# Patient Record
Sex: Male | Born: 1955 | Race: White | Hispanic: No | Marital: Married | Smoking: Former smoker
Health system: Southern US, Community
[De-identification: ages and names within clinical notes are randomized; demographics above are authoritative.]

## PROBLEM LIST (undated history)

## (undated) DIAGNOSIS — M51369 Other intervertebral disc degeneration, lumbar region without mention of lumbar back pain or lower extremity pain: Secondary | ICD-10-CM

## (undated) DIAGNOSIS — T753XXA Motion sickness, initial encounter: Secondary | ICD-10-CM

## (undated) DIAGNOSIS — N50812 Left testicular pain: Secondary | ICD-10-CM

## (undated) DIAGNOSIS — J449 Chronic obstructive pulmonary disease, unspecified: Secondary | ICD-10-CM

## (undated) DIAGNOSIS — M4802 Spinal stenosis, cervical region: Secondary | ICD-10-CM

## (undated) DIAGNOSIS — K5790 Diverticulosis of intestine, part unspecified, without perforation or abscess without bleeding: Secondary | ICD-10-CM

## (undated) DIAGNOSIS — R0600 Dyspnea, unspecified: Secondary | ICD-10-CM

## (undated) DIAGNOSIS — I10 Essential (primary) hypertension: Secondary | ICD-10-CM

## (undated) DIAGNOSIS — Z8719 Personal history of other diseases of the digestive system: Secondary | ICD-10-CM

## (undated) DIAGNOSIS — M5136 Other intervertebral disc degeneration, lumbar region: Secondary | ICD-10-CM

## (undated) DIAGNOSIS — M1990 Unspecified osteoarthritis, unspecified site: Secondary | ICD-10-CM

## (undated) DIAGNOSIS — G479 Sleep disorder, unspecified: Secondary | ICD-10-CM

## (undated) DIAGNOSIS — F419 Anxiety disorder, unspecified: Secondary | ICD-10-CM

## (undated) DIAGNOSIS — I483 Typical atrial flutter: Secondary | ICD-10-CM

## (undated) DIAGNOSIS — F40240 Claustrophobia: Secondary | ICD-10-CM

## (undated) DIAGNOSIS — N400 Enlarged prostate without lower urinary tract symptoms: Secondary | ICD-10-CM

## (undated) DIAGNOSIS — K649 Unspecified hemorrhoids: Secondary | ICD-10-CM

## (undated) DIAGNOSIS — K4090 Unilateral inguinal hernia, without obstruction or gangrene, not specified as recurrent: Secondary | ICD-10-CM

## (undated) DIAGNOSIS — G4730 Sleep apnea, unspecified: Secondary | ICD-10-CM

## (undated) DIAGNOSIS — N529 Male erectile dysfunction, unspecified: Secondary | ICD-10-CM

## (undated) DIAGNOSIS — IMO0002 Reserved for concepts with insufficient information to code with codable children: Secondary | ICD-10-CM

## (undated) DIAGNOSIS — M4180 Other forms of scoliosis, site unspecified: Secondary | ICD-10-CM

## (undated) DIAGNOSIS — Z8679 Personal history of other diseases of the circulatory system: Secondary | ICD-10-CM

## (undated) DIAGNOSIS — K219 Gastro-esophageal reflux disease without esophagitis: Secondary | ICD-10-CM

## (undated) DIAGNOSIS — K635 Polyp of colon: Secondary | ICD-10-CM

## (undated) DIAGNOSIS — K449 Diaphragmatic hernia without obstruction or gangrene: Secondary | ICD-10-CM

## (undated) DIAGNOSIS — E785 Hyperlipidemia, unspecified: Secondary | ICD-10-CM

## (undated) DIAGNOSIS — F329 Major depressive disorder, single episode, unspecified: Secondary | ICD-10-CM

## (undated) DIAGNOSIS — K5900 Constipation, unspecified: Secondary | ICD-10-CM

## (undated) DIAGNOSIS — M5020 Other cervical disc displacement, unspecified cervical region: Secondary | ICD-10-CM

## (undated) DIAGNOSIS — F32A Depression, unspecified: Secondary | ICD-10-CM

## (undated) DIAGNOSIS — M5126 Other intervertebral disc displacement, lumbar region: Secondary | ICD-10-CM

## (undated) HISTORY — DX: Other cervical disc displacement, unspecified cervical region: M50.20

## (undated) HISTORY — DX: Sleep disorder, unspecified: G47.9

## (undated) HISTORY — DX: Anxiety disorder, unspecified: F41.9

## (undated) HISTORY — DX: Reserved for concepts with insufficient information to code with codable children: IMO0002

## (undated) HISTORY — DX: Depression, unspecified: F32.A

## (undated) HISTORY — DX: Unspecified hemorrhoids: K64.9

## (undated) HISTORY — DX: Other intervertebral disc degeneration, lumbar region without mention of lumbar back pain or lower extremity pain: M51.369

## (undated) HISTORY — DX: Gastro-esophageal reflux disease without esophagitis: K21.9

## (undated) HISTORY — DX: Polyp of colon: K63.5

## (undated) HISTORY — DX: Left testicular pain: N50.812

## (undated) HISTORY — DX: Other intervertebral disc displacement, lumbar region: M51.26

## (undated) HISTORY — DX: Male erectile dysfunction, unspecified: N52.9

## (undated) HISTORY — DX: Spinal stenosis, cervical region: M48.02

## (undated) HISTORY — DX: Other forms of scoliosis, site unspecified: M41.80

## (undated) HISTORY — DX: Diverticulosis of intestine, part unspecified, without perforation or abscess without bleeding: K57.90

## (undated) HISTORY — DX: Hyperlipidemia, unspecified: E78.5

## (undated) HISTORY — DX: Constipation, unspecified: K59.00

## (undated) HISTORY — DX: Personal history of other diseases of the digestive system: Z87.19

## (undated) HISTORY — DX: Major depressive disorder, single episode, unspecified: F32.9

## (undated) HISTORY — DX: Typical atrial flutter: I48.3

## (undated) HISTORY — DX: Diaphragmatic hernia without obstruction or gangrene: K44.9

## (undated) HISTORY — DX: Benign prostatic hyperplasia without lower urinary tract symptoms: N40.0

## (undated) HISTORY — DX: Other intervertebral disc degeneration, lumbar region: M51.36

---

## 1992-02-08 HISTORY — PX: GANGLION CYST EXCISION: SHX1691

## 1992-02-08 HISTORY — PX: HERNIA REPAIR: SHX51

## 2005-11-16 ENCOUNTER — Ambulatory Visit: Payer: Self-pay | Admitting: Otolaryngology

## 2007-12-27 ENCOUNTER — Ambulatory Visit: Payer: Self-pay | Admitting: Gastroenterology

## 2008-06-04 ENCOUNTER — Ambulatory Visit: Payer: Self-pay | Admitting: Podiatry

## 2009-02-07 HISTORY — PX: NASAL SEPTUM SURGERY: SHX37

## 2010-02-03 ENCOUNTER — Ambulatory Visit: Payer: Self-pay | Admitting: Internal Medicine

## 2011-02-21 DIAGNOSIS — R059 Cough, unspecified: Secondary | ICD-10-CM | POA: Diagnosis not present

## 2011-02-21 DIAGNOSIS — R05 Cough: Secondary | ICD-10-CM | POA: Diagnosis not present

## 2011-03-24 DIAGNOSIS — R059 Cough, unspecified: Secondary | ICD-10-CM | POA: Diagnosis not present

## 2011-03-24 DIAGNOSIS — R05 Cough: Secondary | ICD-10-CM | POA: Diagnosis not present

## 2011-04-18 DIAGNOSIS — J069 Acute upper respiratory infection, unspecified: Secondary | ICD-10-CM | POA: Diagnosis not present

## 2011-04-18 DIAGNOSIS — E7800 Pure hypercholesterolemia, unspecified: Secondary | ICD-10-CM | POA: Insufficient documentation

## 2011-04-18 DIAGNOSIS — K635 Polyp of colon: Secondary | ICD-10-CM | POA: Insufficient documentation

## 2011-04-18 DIAGNOSIS — D126 Benign neoplasm of colon, unspecified: Secondary | ICD-10-CM | POA: Diagnosis not present

## 2011-04-18 DIAGNOSIS — R079 Chest pain, unspecified: Secondary | ICD-10-CM | POA: Diagnosis not present

## 2011-04-18 HISTORY — DX: Polyp of colon: K63.5

## 2011-04-29 DIAGNOSIS — I209 Angina pectoris, unspecified: Secondary | ICD-10-CM | POA: Diagnosis not present

## 2011-05-09 DIAGNOSIS — Z86010 Personal history of colonic polyps: Secondary | ICD-10-CM | POA: Diagnosis not present

## 2011-06-16 DIAGNOSIS — J0100 Acute maxillary sinusitis, unspecified: Secondary | ICD-10-CM | POA: Diagnosis not present

## 2011-08-02 DIAGNOSIS — R42 Dizziness and giddiness: Secondary | ICD-10-CM | POA: Diagnosis not present

## 2011-08-02 DIAGNOSIS — R079 Chest pain, unspecified: Secondary | ICD-10-CM | POA: Diagnosis not present

## 2011-08-02 DIAGNOSIS — M542 Cervicalgia: Secondary | ICD-10-CM | POA: Diagnosis not present

## 2011-08-02 DIAGNOSIS — E7800 Pure hypercholesterolemia, unspecified: Secondary | ICD-10-CM | POA: Diagnosis not present

## 2011-09-05 DIAGNOSIS — N529 Male erectile dysfunction, unspecified: Secondary | ICD-10-CM | POA: Diagnosis not present

## 2011-09-05 DIAGNOSIS — N401 Enlarged prostate with lower urinary tract symptoms: Secondary | ICD-10-CM | POA: Diagnosis not present

## 2011-09-05 DIAGNOSIS — N138 Other obstructive and reflux uropathy: Secondary | ICD-10-CM | POA: Diagnosis not present

## 2011-11-02 DIAGNOSIS — R143 Flatulence: Secondary | ICD-10-CM | POA: Diagnosis not present

## 2011-11-02 DIAGNOSIS — Z23 Encounter for immunization: Secondary | ICD-10-CM | POA: Diagnosis not present

## 2011-11-02 DIAGNOSIS — E7800 Pure hypercholesterolemia, unspecified: Secondary | ICD-10-CM | POA: Diagnosis not present

## 2011-11-02 DIAGNOSIS — M542 Cervicalgia: Secondary | ICD-10-CM | POA: Diagnosis not present

## 2011-11-02 DIAGNOSIS — R141 Gas pain: Secondary | ICD-10-CM | POA: Diagnosis not present

## 2011-11-02 DIAGNOSIS — F40298 Other specified phobia: Secondary | ICD-10-CM | POA: Diagnosis not present

## 2011-12-14 DIAGNOSIS — M5020 Other cervical disc displacement, unspecified cervical region: Secondary | ICD-10-CM | POA: Diagnosis not present

## 2011-12-14 DIAGNOSIS — M999 Biomechanical lesion, unspecified: Secondary | ICD-10-CM | POA: Diagnosis not present

## 2011-12-14 DIAGNOSIS — M5430 Sciatica, unspecified side: Secondary | ICD-10-CM | POA: Diagnosis not present

## 2011-12-14 DIAGNOSIS — M9981 Other biomechanical lesions of cervical region: Secondary | ICD-10-CM | POA: Diagnosis not present

## 2011-12-16 DIAGNOSIS — M5020 Other cervical disc displacement, unspecified cervical region: Secondary | ICD-10-CM | POA: Diagnosis not present

## 2011-12-16 DIAGNOSIS — M9981 Other biomechanical lesions of cervical region: Secondary | ICD-10-CM | POA: Diagnosis not present

## 2011-12-16 DIAGNOSIS — M5430 Sciatica, unspecified side: Secondary | ICD-10-CM | POA: Diagnosis not present

## 2011-12-16 DIAGNOSIS — M999 Biomechanical lesion, unspecified: Secondary | ICD-10-CM | POA: Diagnosis not present

## 2011-12-19 DIAGNOSIS — M999 Biomechanical lesion, unspecified: Secondary | ICD-10-CM | POA: Diagnosis not present

## 2011-12-19 DIAGNOSIS — M5430 Sciatica, unspecified side: Secondary | ICD-10-CM | POA: Diagnosis not present

## 2011-12-19 DIAGNOSIS — M5020 Other cervical disc displacement, unspecified cervical region: Secondary | ICD-10-CM | POA: Diagnosis not present

## 2011-12-19 DIAGNOSIS — M9981 Other biomechanical lesions of cervical region: Secondary | ICD-10-CM | POA: Diagnosis not present

## 2011-12-21 DIAGNOSIS — M999 Biomechanical lesion, unspecified: Secondary | ICD-10-CM | POA: Diagnosis not present

## 2011-12-21 DIAGNOSIS — M5430 Sciatica, unspecified side: Secondary | ICD-10-CM | POA: Diagnosis not present

## 2011-12-21 DIAGNOSIS — M9981 Other biomechanical lesions of cervical region: Secondary | ICD-10-CM | POA: Diagnosis not present

## 2011-12-21 DIAGNOSIS — M5020 Other cervical disc displacement, unspecified cervical region: Secondary | ICD-10-CM | POA: Diagnosis not present

## 2011-12-27 DIAGNOSIS — J329 Chronic sinusitis, unspecified: Secondary | ICD-10-CM | POA: Insufficient documentation

## 2011-12-27 DIAGNOSIS — R0989 Other specified symptoms and signs involving the circulatory and respiratory systems: Secondary | ICD-10-CM | POA: Insufficient documentation

## 2011-12-27 DIAGNOSIS — J069 Acute upper respiratory infection, unspecified: Secondary | ICD-10-CM | POA: Diagnosis not present

## 2011-12-29 DIAGNOSIS — J069 Acute upper respiratory infection, unspecified: Secondary | ICD-10-CM | POA: Insufficient documentation

## 2011-12-30 DIAGNOSIS — M5020 Other cervical disc displacement, unspecified cervical region: Secondary | ICD-10-CM | POA: Diagnosis not present

## 2011-12-30 DIAGNOSIS — M5430 Sciatica, unspecified side: Secondary | ICD-10-CM | POA: Diagnosis not present

## 2011-12-30 DIAGNOSIS — M9981 Other biomechanical lesions of cervical region: Secondary | ICD-10-CM | POA: Diagnosis not present

## 2011-12-30 DIAGNOSIS — M999 Biomechanical lesion, unspecified: Secondary | ICD-10-CM | POA: Diagnosis not present

## 2012-01-02 DIAGNOSIS — M5020 Other cervical disc displacement, unspecified cervical region: Secondary | ICD-10-CM | POA: Diagnosis not present

## 2012-01-02 DIAGNOSIS — M5430 Sciatica, unspecified side: Secondary | ICD-10-CM | POA: Diagnosis not present

## 2012-01-02 DIAGNOSIS — M999 Biomechanical lesion, unspecified: Secondary | ICD-10-CM | POA: Diagnosis not present

## 2012-01-02 DIAGNOSIS — M9981 Other biomechanical lesions of cervical region: Secondary | ICD-10-CM | POA: Diagnosis not present

## 2012-01-04 DIAGNOSIS — R350 Frequency of micturition: Secondary | ICD-10-CM | POA: Insufficient documentation

## 2012-01-04 DIAGNOSIS — N529 Male erectile dysfunction, unspecified: Secondary | ICD-10-CM

## 2012-01-04 DIAGNOSIS — R972 Elevated prostate specific antigen [PSA]: Secondary | ICD-10-CM | POA: Diagnosis not present

## 2012-01-04 DIAGNOSIS — R351 Nocturia: Secondary | ICD-10-CM | POA: Insufficient documentation

## 2012-01-04 HISTORY — DX: Male erectile dysfunction, unspecified: N52.9

## 2012-01-09 DIAGNOSIS — M5430 Sciatica, unspecified side: Secondary | ICD-10-CM | POA: Diagnosis not present

## 2012-01-09 DIAGNOSIS — M9981 Other biomechanical lesions of cervical region: Secondary | ICD-10-CM | POA: Diagnosis not present

## 2012-01-09 DIAGNOSIS — M5020 Other cervical disc displacement, unspecified cervical region: Secondary | ICD-10-CM | POA: Diagnosis not present

## 2012-01-09 DIAGNOSIS — M999 Biomechanical lesion, unspecified: Secondary | ICD-10-CM | POA: Diagnosis not present

## 2012-01-12 DIAGNOSIS — R0989 Other specified symptoms and signs involving the circulatory and respiratory systems: Secondary | ICD-10-CM | POA: Diagnosis not present

## 2012-01-13 DIAGNOSIS — M9981 Other biomechanical lesions of cervical region: Secondary | ICD-10-CM | POA: Diagnosis not present

## 2012-01-13 DIAGNOSIS — M999 Biomechanical lesion, unspecified: Secondary | ICD-10-CM | POA: Diagnosis not present

## 2012-01-13 DIAGNOSIS — M5020 Other cervical disc displacement, unspecified cervical region: Secondary | ICD-10-CM | POA: Diagnosis not present

## 2012-01-13 DIAGNOSIS — M5430 Sciatica, unspecified side: Secondary | ICD-10-CM | POA: Diagnosis not present

## 2012-01-17 ENCOUNTER — Ambulatory Visit: Payer: Self-pay | Admitting: Nurse Practitioner

## 2012-01-17 DIAGNOSIS — R0989 Other specified symptoms and signs involving the circulatory and respiratory systems: Secondary | ICD-10-CM | POA: Diagnosis not present

## 2012-01-17 DIAGNOSIS — R918 Other nonspecific abnormal finding of lung field: Secondary | ICD-10-CM | POA: Diagnosis not present

## 2012-01-18 DIAGNOSIS — R0989 Other specified symptoms and signs involving the circulatory and respiratory systems: Secondary | ICD-10-CM | POA: Diagnosis not present

## 2012-01-19 DIAGNOSIS — J209 Acute bronchitis, unspecified: Secondary | ICD-10-CM | POA: Diagnosis not present

## 2012-01-19 DIAGNOSIS — J301 Allergic rhinitis due to pollen: Secondary | ICD-10-CM | POA: Diagnosis not present

## 2012-02-08 HISTORY — PX: COLONOSCOPY: SHX174

## 2012-02-17 DIAGNOSIS — J45909 Unspecified asthma, uncomplicated: Secondary | ICD-10-CM | POA: Diagnosis not present

## 2012-03-15 DIAGNOSIS — R05 Cough: Secondary | ICD-10-CM | POA: Diagnosis not present

## 2012-03-15 DIAGNOSIS — J45909 Unspecified asthma, uncomplicated: Secondary | ICD-10-CM | POA: Diagnosis not present

## 2012-03-15 DIAGNOSIS — R059 Cough, unspecified: Secondary | ICD-10-CM | POA: Diagnosis not present

## 2012-03-20 DIAGNOSIS — J209 Acute bronchitis, unspecified: Secondary | ICD-10-CM | POA: Diagnosis not present

## 2012-04-06 DIAGNOSIS — S139XXA Sprain of joints and ligaments of unspecified parts of neck, initial encounter: Secondary | ICD-10-CM | POA: Diagnosis not present

## 2012-04-06 DIAGNOSIS — S335XXA Sprain of ligaments of lumbar spine, initial encounter: Secondary | ICD-10-CM | POA: Diagnosis not present

## 2012-04-11 DIAGNOSIS — R05 Cough: Secondary | ICD-10-CM | POA: Diagnosis not present

## 2012-04-11 DIAGNOSIS — R059 Cough, unspecified: Secondary | ICD-10-CM | POA: Diagnosis not present

## 2012-05-01 DIAGNOSIS — D518 Other vitamin B12 deficiency anemias: Secondary | ICD-10-CM | POA: Diagnosis not present

## 2012-05-01 DIAGNOSIS — R059 Cough, unspecified: Secondary | ICD-10-CM | POA: Diagnosis not present

## 2012-05-01 DIAGNOSIS — R05 Cough: Secondary | ICD-10-CM | POA: Diagnosis not present

## 2012-05-01 DIAGNOSIS — E7800 Pure hypercholesterolemia, unspecified: Secondary | ICD-10-CM | POA: Diagnosis not present

## 2012-05-01 DIAGNOSIS — R5383 Other fatigue: Secondary | ICD-10-CM | POA: Insufficient documentation

## 2012-05-01 DIAGNOSIS — R5381 Other malaise: Secondary | ICD-10-CM | POA: Diagnosis not present

## 2012-05-01 DIAGNOSIS — D649 Anemia, unspecified: Secondary | ICD-10-CM | POA: Diagnosis not present

## 2012-07-20 DIAGNOSIS — S139XXA Sprain of joints and ligaments of unspecified parts of neck, initial encounter: Secondary | ICD-10-CM | POA: Diagnosis not present

## 2012-07-20 DIAGNOSIS — S335XXA Sprain of ligaments of lumbar spine, initial encounter: Secondary | ICD-10-CM | POA: Diagnosis not present

## 2012-08-01 DIAGNOSIS — F411 Generalized anxiety disorder: Secondary | ICD-10-CM | POA: Diagnosis not present

## 2012-08-01 DIAGNOSIS — J329 Chronic sinusitis, unspecified: Secondary | ICD-10-CM | POA: Diagnosis not present

## 2012-08-01 DIAGNOSIS — E7800 Pure hypercholesterolemia, unspecified: Secondary | ICD-10-CM | POA: Diagnosis not present

## 2012-08-01 DIAGNOSIS — R5381 Other malaise: Secondary | ICD-10-CM | POA: Diagnosis not present

## 2012-08-14 DIAGNOSIS — N433 Hydrocele, unspecified: Secondary | ICD-10-CM | POA: Diagnosis not present

## 2012-08-23 DIAGNOSIS — N401 Enlarged prostate with lower urinary tract symptoms: Secondary | ICD-10-CM | POA: Diagnosis not present

## 2012-08-23 DIAGNOSIS — N138 Other obstructive and reflux uropathy: Secondary | ICD-10-CM | POA: Insufficient documentation

## 2012-08-31 ENCOUNTER — Ambulatory Visit (INDEPENDENT_AMBULATORY_CARE_PROVIDER_SITE_OTHER): Payer: Medicare Other | Admitting: Adult Health

## 2012-08-31 ENCOUNTER — Encounter: Payer: Self-pay | Admitting: Adult Health

## 2012-08-31 VITALS — BP 118/72 | HR 60 | Temp 98.0°F | Resp 12 | Ht 70.5 in | Wt 194.5 lb

## 2012-08-31 DIAGNOSIS — K5900 Constipation, unspecified: Secondary | ICD-10-CM

## 2012-08-31 DIAGNOSIS — Z0000 Encounter for general adult medical examination without abnormal findings: Secondary | ICD-10-CM

## 2012-08-31 DIAGNOSIS — Z86010 Personal history of colonic polyps: Secondary | ICD-10-CM | POA: Diagnosis not present

## 2012-08-31 HISTORY — DX: Constipation, unspecified: K59.00

## 2012-08-31 NOTE — Patient Instructions (Addendum)
   Thank you for choosing [facility] for your health care needs.  Please remember to activate your MyChart account.  For your constipation, please take miralax 17 grams daily mixed in 8 oz of fluid of choice. If your stools become too loose then decrease to every other day or every 2 days.  I will request your medical records from Dr. Leavy Cella and Dr. Virl Diamond

## 2012-08-31 NOTE — Progress Notes (Signed)
Subjective:    Patient ID: [name], male    DOB: 1955-06-02, 57 y.o.   MRN: [id number]  HPI  Patient is a pleasant 57 y/o male who presents to clinic to establish care. He was previously followed by Dr. Orson Aloe who is moving out of the area. He also sees Dr. Lonna Cobb once per year. Will request medical records. Patient is disabled secondary to multiple disc bulges, herniations, abutment & stenosis. Patient has problems with chronic back pain. He reports history of at least 2 sinus infections a year which are "pretty bad" and usually require antibiotics.   Past Medical History  Diagnosis Date  . H/O diverticulitis of colon   . GERD (gastroesophageal reflux disease)   . Hyperlipidemia   . Colon polyps   . Diverticulosis   . Bulging disc     C2/3, C3/4, C6/7  . Cervical disc herniation     C4/5 and C5/6  . Spinal stenosis in cervical region     cord abutment C4/5  . Bulge of lumbar disc without myelopathy     L2/3 through L5/6  . Levoscoliosis     Past Surgical History  Procedure Laterality Date  . Ganglion cyst excision Right 1994    wrist  . Hernia repair Left 1994    abdominal repair with mesh    Family History  Problem Relation Age of Onset  . Hyperlipidemia Mother   . Cancer Mother     skin cancer?  . Heart disease Mother 54    MI - died in her sleep  . Hyperlipidemia Father   . Heart disease Father     History   Social History  . Marital Status: Married    Spouse Name: Charisa Twitty    Number of Children: 1  . Years of Education: GED   Occupational History  . Disability     Herniated disk, spinal stenosis   Social History Main Topics  . Smoking status: Former Smoker -- 15 years    Types: Cigarettes    Quit date: 02/08/1991  . Smokeless tobacco: Never Used  . Alcohol Use: No  . Drug Use: No  . Sexually Active: Yes -- Male partner(s)   Other Topics Concern  . Not on file   Social History Narrative   Rashid was born and raised in [place], [place]. He quit school after the 8th grade because of financial hardship. He was working since age 51. He went back and got his GED. He moved to [place] in 2006. He lives at home with his wife of 12 years and their 48 year old daughter. Previously, Freeman was married to his first wife for 20 years and they divorced. They had no children.  Maximilliano has been disabled 2/2 back problems.      Review of Systems  HENT: Positive for postnasal drip.        At least 2 sinus infections yearly.  Eyes: Negative.   Respiratory: Negative.   Cardiovascular: Negative.   Gastrointestinal: Positive for constipation.       Problems with increase gas production. Burping.  Genitourinary: Positive for frequency. Negative for dysuria, urgency and flank pain.  Musculoskeletal: Positive for back pain.       Neck pain from bulging disc  Skin: Negative.   Allergic/Immunologic: Negative.   Neurological: Positive for weakness, numbness and headaches. Negative for tremors.       Numbness and tingling of fingertips. Worse on the left.   Hematological: Negative.  Psychiatric/Behavioral: Positive for sleep disturbance. Negative for behavioral problems, confusion and agitation. The patient is nervous/anxious.        Claustrophobia    BP 118/72  Pulse 60  Temp(Src) 98 F (36.7 C) (Oral)  Resp 12  Ht 5' 10.5" (1.791 m)  Wt 194 lb 8 oz (88.225 kg)  BMI 27.5 kg/m2  SpO2 97%    Objective:   Physical Exam  Constitutional: He is oriented to person, place, and time. He appears well-developed and well-nourished. No distress.  HENT:  Head: Normocephalic and atraumatic.  Right Ear: External ear normal.  Left Ear: External ear normal.  Eyes: Conjunctivae and EOM are normal. Pupils are equal, round, and reactive to light.  Neck: Normal range of motion. Neck supple. No tracheal deviation present.  Cardiovascular: Normal rate, regular rhythm, normal heart sounds and intact distal pulses.  Exam reveals no gallop.   No murmur  heard. Pulmonary/Chest: Effort normal and breath sounds normal. No respiratory distress. He has no wheezes. He has no rales.  Abdominal: Soft. Bowel sounds are normal. He exhibits no distension and no mass. There is no tenderness. There is no rebound and no guarding.  Musculoskeletal: Normal range of motion. He exhibits no edema and no tenderness.  Lymphadenopathy:    He has no cervical adenopathy.  Neurological: He is alert and oriented to person, place, and time. He has normal reflexes. No cranial nerve deficit. Coordination normal.  Psychiatric: He has a normal mood and affect. His behavior is normal. Judgment and thought content normal.      Assessment & Plan:

## 2012-08-31 NOTE — Assessment & Plan Note (Addendum)
Normal physical exam except for weakness and numbness in upper extremities 2/2 cervical stenosis and disc dz. Left upper extremity is worse than the right. Will request medical records from previous PCP. Patient reports recent blood work but uncertain what he had drawn. Patient will need repeat colonoscopy for hx of polyps. Will order with Dr. Servando Snare in [facility] per his request. Note, greater than 60 min were spent in face to face communication with patient in H&P, assessment, evaluation and implementation of care.

## 2012-08-31 NOTE — Assessment & Plan Note (Signed)
Start Miralax daily. If stool become to loose then cut back to every other day or every 2 days.

## 2012-09-13 ENCOUNTER — Telehealth: Payer: Self-pay | Admitting: Internal Medicine

## 2012-09-13 ENCOUNTER — Encounter: Payer: Self-pay | Admitting: *Deleted

## 2012-09-13 NOTE — Telephone Encounter (Signed)
Pt came in today stating it is time for his colonscopy and wanted to know if he needed to schedule this or would you schedule this for him.  Pt  Stated he has seen dr Servando Snare before and he has seen dr Augustin Schooling.  Pt stated he would perfer to stay in the [place] area if possible  He also stated he didn't mind going to dr Servando Snare in [place] if you perfer that.   Please advise.   Pt stated stated that he received a message from [facility] stating it was time for his tentus shot  Can he get this?  Nurse visit or does he need to see md

## 2012-09-13 NOTE — Telephone Encounter (Signed)
Sent myChart message with response.

## 2012-09-26 ENCOUNTER — Encounter: Payer: Self-pay | Admitting: *Deleted

## 2012-09-26 ENCOUNTER — Other Ambulatory Visit: Payer: Self-pay | Admitting: *Deleted

## 2012-09-26 MED ORDER — ATORVASTATIN CALCIUM 10 MG PO TABS: 10.0000 mg | ORAL_TABLET | Freq: Every day | ORAL | Status: DC

## 2012-09-27 ENCOUNTER — Telehealth: Payer: Self-pay | Admitting: *Deleted

## 2012-09-27 NOTE — Telephone Encounter (Signed)
Received fax from [facility] for prior auth for Nexium. Form completed and signed by Orville Govern, NP and faxed to [phone].

## 2012-09-28 ENCOUNTER — Telehealth: Payer: Self-pay | Admitting: Adult Health

## 2012-09-28 NOTE — Telephone Encounter (Signed)
Received fax from [facility], Nexium was APPROVED   08.21.2014-08.21.2014

## 2012-10-03 DIAGNOSIS — R12 Heartburn: Secondary | ICD-10-CM | POA: Diagnosis not present

## 2012-10-03 DIAGNOSIS — Z86010 Personal history of colonic polyps: Secondary | ICD-10-CM | POA: Diagnosis not present

## 2012-10-10 ENCOUNTER — Other Ambulatory Visit: Payer: Self-pay | Admitting: *Deleted

## 2012-10-10 MED ORDER — ATORVASTATIN CALCIUM 10 MG PO TABS: 10.0000 mg | ORAL_TABLET | Freq: Every day | ORAL | Status: DC

## 2012-10-10 NOTE — Telephone Encounter (Signed)
°  Send to [facility] for a 90 day supply  atorvastatin (LIPITOR) 10 MG tablet

## 2012-10-12 ENCOUNTER — Telehealth: Payer: Self-pay | Admitting: Adult Health

## 2012-10-12 NOTE — Telephone Encounter (Signed)
See below my chart message  Appointment Request From: Clent Demark With Provider: Orville Govern, NP [-Primary Care Physician-] Preferred Date Range: Any date 11/08/2012 or later Preferred Times: Thursday Morning Reason: To address the following health maintenance concerns. Colonoscopy Comments: i have an appointment on october 2nd with dr.whol .

## 2012-10-15 ENCOUNTER — Telehealth: Payer: Self-pay | Admitting: Adult Health

## 2012-10-15 DIAGNOSIS — R109 Unspecified abdominal pain: Secondary | ICD-10-CM | POA: Diagnosis not present

## 2012-10-15 NOTE — Telephone Encounter (Signed)
Is it ok for pt to get tdap and flu shot   Appointment Request From: Clent Demark      With Provider: Orville Govern, NP [-Primary Care Physician-]      Preferred Date Range: From 10/22/2012 To 10/26/2012      Preferred Times: Monday Morning, Tuesday Morning, Wednesday Morning, Thursday Morning, Friday Morning      Reason: To address the following health maintenance concerns.   Tetanus/Tdap   Influenza Vaccine      Comments:

## 2012-10-15 NOTE — Telephone Encounter (Signed)
Appointment 9/18  Sent my chart message

## 2012-10-15 NOTE — Telephone Encounter (Signed)
yes

## 2012-10-17 ENCOUNTER — Ambulatory Visit (INDEPENDENT_AMBULATORY_CARE_PROVIDER_SITE_OTHER): Payer: Medicare Other | Admitting: *Deleted

## 2012-10-17 ENCOUNTER — Encounter: Payer: Self-pay | Admitting: Adult Health

## 2012-10-17 DIAGNOSIS — Z23 Encounter for immunization: Secondary | ICD-10-CM | POA: Diagnosis not present

## 2012-10-19 DIAGNOSIS — S139XXA Sprain of joints and ligaments of unspecified parts of neck, initial encounter: Secondary | ICD-10-CM | POA: Diagnosis not present

## 2012-10-19 DIAGNOSIS — S335XXA Sprain of ligaments of lumbar spine, initial encounter: Secondary | ICD-10-CM | POA: Diagnosis not present

## 2012-10-25 ENCOUNTER — Ambulatory Visit: Payer: BC Managed Care – PPO

## 2012-10-30 ENCOUNTER — Encounter: Payer: Self-pay | Admitting: Adult Health

## 2012-10-30 ENCOUNTER — Ambulatory Visit (INDEPENDENT_AMBULATORY_CARE_PROVIDER_SITE_OTHER): Payer: Medicare Other | Admitting: Adult Health

## 2012-10-30 VITALS — BP 112/72 | HR 58 | Temp 97.8°F | Resp 12 | Wt 192.0 lb

## 2012-10-30 DIAGNOSIS — J329 Chronic sinusitis, unspecified: Secondary | ICD-10-CM

## 2012-10-30 DIAGNOSIS — Z79899 Other long term (current) drug therapy: Secondary | ICD-10-CM

## 2012-10-30 MED ORDER — PANTOPRAZOLE SODIUM 40 MG PO TBEC: 40.0000 mg | DELAYED_RELEASE_TABLET | Freq: Every day | ORAL | Status: DC

## 2012-10-30 MED ORDER — AMOXICILLIN-POT CLAVULANATE 875-125 MG PO TABS: 1.0000 | ORAL_TABLET | Freq: Two times a day (BID) | ORAL | Status: DC

## 2012-10-30 NOTE — Assessment & Plan Note (Signed)
Symptoms ongoing for greater than one week. Patient is concerned because he has an appointment for upper endoscopy in the beginning of October and wants to make sure that he will be able to have it done. Will start Augmentin twice a day x10 days. Continue Flonase as instructed. Return to clinic if symptoms are not improved within 3-4 days.

## 2012-10-30 NOTE — Patient Instructions (Addendum)
Start Augmentin twice a day for 10 days.  Continue flonase.   Sinusitis is a condition that can cause a stuffy nose, pain in the face, and yellow or green discharge (mucus) from the nose. The sinuses are hollow areas in the bones of the face. They have a thin lining that normally makes a small amount of mucus. When this lining gets infected, it swells and makes extra mucus. This causes symptoms.   Sinusitis can occur when a person gets sick with a cold. The germs causing the cold can also infect the sinuses. Many times, a person feels like his or her cold is getting better. But then he or she gets sinusitis and begins to feel sick again.  What are the symptoms of sinusitis? - Common symptoms of sinusitis include:  Stuffy or blocked nose  Thick yellow or green discharge from the nose  Pain in the teeth  Pain or pressure in the face - This often feels worse when a person bends forward.   People with sinusitis can also have other symptoms that include:  Fever  Cough  Trouble smelling  Ear pressure or fullness  Headache  Bad breath  Feeling tired   Most of the time, symptoms start to improve in 7 to 10 days.  See your doctor or nurse if your symptoms last more than 7 days, or if your symptoms get better at first but then get worse.  Sometimes, sinusitis can lead to serious problems. See your doctor or nurse right away (do not wait 7 days) if you have:  Fever higher than 102.24F (39.2C)  Sudden and severe pain in the face and head  Trouble seeing or seeing double  Trouble thinking clearly  Swelling or redness around 1 or both eyes  Trouble breathing or a stiff neck   Is there anything I can do on my own to feel better? - Yes. To reduce your symptoms, you can: Take an over-the-counter pain reliever to reduce the pain  Rinse your nose and sinuses with salt water a few times a day - Ask your doctor or nurse about the best way to do this.  Use a decongestant nose spray - These  sprays are sold in a pharmacy. But do not use decongestant nose sprays for more than 2 to 3 days in a row. Using them more than 3 days in a row can make symptoms worse.   You should NOT take an antihistamine for sinusitis. Common antihistamines include diphenhydramine (sample brand name: Benadryl), chlorpheniramine (sample brand name: Chlor-Trimeton), loratadine (sample brand name: Claritin), and cetirizine (sample brand name: Zyrtec). They can treat allergies, but not sinus infections, and could increase your discomfort by drying the lining of your nose and sinuses, or making you tired.   Your doctor might also prescribe a steroid nose spray to reduce the swelling in your nose. (Steroid nose sprays do not contain the same steroids that athletes take to build muscle.)  How is sinusitis treated? - Most of the time, sinusitis does not need to be treated with antibiotic medicines. This is because most sinusitis is caused by viruses - not bacteria - and antibiotics do not kill viruses. Many people get over sinus infections without antibiotics.  Some people with sinusitis do need treatment with antibiotics. If your symptoms have not improved after 7 to 10 days, ask your doctor if you should take antibiotics. Your doctor might recommend that you wait 1 more week to see if your symptoms improve. But if  you have symptoms such as a fever or a lot of pain, he or she might prescribe antibiotics. It is important to follow your doctor's instructions about taking your antibiotics.

## 2012-10-30 NOTE — Progress Notes (Signed)
  Subjective:    Patient ID: [name], male    DOB: March 13, 1955, 57 y.o.   MRN: [id number]  HPI  Patient is a pleasant 57 y/o male who presents to clinic with c/o sinus symptoms for greater than 1 week. He reports having wheezing, thick mucous that he is coughing up. Denies fever. Sinus pressure and congestion. He has been using flonase. He has not tried any OTC medication.   Current Outpatient Prescriptions on File Prior to Visit  Medication Sig Dispense Refill  . atorvastatin (LIPITOR) 10 MG tablet Take 1 tablet (10 mg total) by mouth daily.  90 tablet  1  . CIALIS 5 MG tablet Take 5 mg by mouth daily as needed.       . cyclobenzaprine (FLEXERIL) 10 MG tablet Take 10 mg by mouth 3 (three) times daily as needed.       . fluticasone (FLONASE) 50 MCG/ACT nasal spray Place 2 sprays into the nose daily.      [name] NEXIUM 40 MG capsule Take 40 mg by mouth daily.       [name] triamcinolone (NASACORT) 55 MCG/ACT nasal inhaler Place 2 sprays into the nose daily.      [name] zolpidem (AMBIEN) 5 MG tablet Take 5 mg by mouth at bedtime as needed.        No current facility-administered medications on file prior to visit.     Review of Systems  Constitutional: Negative for fever, chills and appetite change.  HENT: Positive for congestion, rhinorrhea, postnasal drip and sinus pressure. Negative for sore throat.   Respiratory: Positive for cough and wheezing. Negative for shortness of breath.   Cardiovascular: Negative.   Gastrointestinal: Negative.      BP 112/72  Pulse 58  Temp(Src) 97.8 F (36.6 C) (Oral)  Resp 12  Wt 192 lb (87.091 kg)  BMI 27.15 kg/m2  SpO2 97%    Objective:   Physical Exam  Constitutional: He is oriented to person, place, and time. He appears well-developed and well-nourished. No distress.  HENT:  Head: Normocephalic and atraumatic.  Right Ear: External ear normal.  Left Ear: External ear normal.  Pharyngeal erythema with drainage noted.  Cardiovascular: Normal rate,  regular rhythm and normal heart sounds.   Pulmonary/Chest: Effort normal and breath sounds normal. He has no wheezes. He has no rales.  Lymphadenopathy:    He has no cervical adenopathy.  Neurological: He is alert and oriented to person, place, and time.  Psychiatric: He has a normal mood and affect. His behavior is normal. Judgment and thought content normal.          Assessment & Plan:

## 2012-10-30 NOTE — Assessment & Plan Note (Signed)
Patient is currently on Nexium 40 mg daily. He tried to obtain this prescription through his mail order pharmacy; however, they notified him and advised that a three-month supply of Nexium would cost him approximately $1000. Change prescription to Protonix 40 mg daily. Prescription was sent to pharmacy.

## 2012-11-08 ENCOUNTER — Ambulatory Visit: Payer: Self-pay | Admitting: Gastroenterology

## 2012-11-08 DIAGNOSIS — K2289 Other specified disease of esophagus: Secondary | ICD-10-CM | POA: Diagnosis not present

## 2012-11-08 DIAGNOSIS — K228 Other specified diseases of esophagus: Secondary | ICD-10-CM | POA: Diagnosis not present

## 2012-11-08 DIAGNOSIS — E785 Hyperlipidemia, unspecified: Secondary | ICD-10-CM | POA: Diagnosis not present

## 2012-11-08 DIAGNOSIS — Z87891 Personal history of nicotine dependence: Secondary | ICD-10-CM | POA: Diagnosis not present

## 2012-11-08 DIAGNOSIS — Z79899 Other long term (current) drug therapy: Secondary | ICD-10-CM | POA: Diagnosis not present

## 2012-11-08 DIAGNOSIS — Z860100 Personal history of colon polyps, unspecified: Secondary | ICD-10-CM | POA: Diagnosis not present

## 2012-11-08 DIAGNOSIS — K219 Gastro-esophageal reflux disease without esophagitis: Secondary | ICD-10-CM | POA: Diagnosis not present

## 2012-11-08 DIAGNOSIS — R1310 Dysphagia, unspecified: Secondary | ICD-10-CM | POA: Diagnosis not present

## 2012-11-08 DIAGNOSIS — K648 Other hemorrhoids: Secondary | ICD-10-CM | POA: Diagnosis not present

## 2012-11-08 DIAGNOSIS — D131 Benign neoplasm of stomach: Secondary | ICD-10-CM | POA: Diagnosis not present

## 2012-11-08 DIAGNOSIS — K449 Diaphragmatic hernia without obstruction or gangrene: Secondary | ICD-10-CM | POA: Diagnosis not present

## 2012-11-08 DIAGNOSIS — Z8249 Family history of ischemic heart disease and other diseases of the circulatory system: Secondary | ICD-10-CM | POA: Diagnosis not present

## 2012-11-08 DIAGNOSIS — K5730 Diverticulosis of large intestine without perforation or abscess without bleeding: Secondary | ICD-10-CM | POA: Diagnosis not present

## 2012-11-14 DIAGNOSIS — M5430 Sciatica, unspecified side: Secondary | ICD-10-CM | POA: Diagnosis not present

## 2012-11-14 DIAGNOSIS — M5020 Other cervical disc displacement, unspecified cervical region: Secondary | ICD-10-CM | POA: Diagnosis not present

## 2012-11-14 DIAGNOSIS — M9981 Other biomechanical lesions of cervical region: Secondary | ICD-10-CM | POA: Diagnosis not present

## 2012-11-14 DIAGNOSIS — M999 Biomechanical lesion, unspecified: Secondary | ICD-10-CM | POA: Diagnosis not present

## 2012-11-19 DIAGNOSIS — M999 Biomechanical lesion, unspecified: Secondary | ICD-10-CM | POA: Diagnosis not present

## 2012-11-19 DIAGNOSIS — M9981 Other biomechanical lesions of cervical region: Secondary | ICD-10-CM | POA: Diagnosis not present

## 2012-11-19 DIAGNOSIS — M5020 Other cervical disc displacement, unspecified cervical region: Secondary | ICD-10-CM | POA: Diagnosis not present

## 2012-11-19 DIAGNOSIS — M5430 Sciatica, unspecified side: Secondary | ICD-10-CM | POA: Diagnosis not present

## 2012-11-19 LAB — HM COLONOSCOPY

## 2012-11-22 ENCOUNTER — Telehealth: Payer: Self-pay | Admitting: Adult Health

## 2012-11-22 NOTE — Telephone Encounter (Signed)
Pt dropped off paperwork for handicap placard.  Placed in [name]'s box.  Please contact pt when ready for pickup.

## 2012-11-23 DIAGNOSIS — M999 Biomechanical lesion, unspecified: Secondary | ICD-10-CM | POA: Diagnosis not present

## 2012-11-23 DIAGNOSIS — M9981 Other biomechanical lesions of cervical region: Secondary | ICD-10-CM | POA: Diagnosis not present

## 2012-11-23 DIAGNOSIS — M5020 Other cervical disc displacement, unspecified cervical region: Secondary | ICD-10-CM | POA: Diagnosis not present

## 2012-11-23 DIAGNOSIS — M5430 Sciatica, unspecified side: Secondary | ICD-10-CM | POA: Diagnosis not present

## 2012-11-23 NOTE — Telephone Encounter (Signed)
Form given to [name] 

## 2012-11-26 DIAGNOSIS — M9981 Other biomechanical lesions of cervical region: Secondary | ICD-10-CM | POA: Diagnosis not present

## 2012-11-26 DIAGNOSIS — M5020 Other cervical disc displacement, unspecified cervical region: Secondary | ICD-10-CM | POA: Diagnosis not present

## 2012-11-26 DIAGNOSIS — M999 Biomechanical lesion, unspecified: Secondary | ICD-10-CM | POA: Diagnosis not present

## 2012-11-26 DIAGNOSIS — M5430 Sciatica, unspecified side: Secondary | ICD-10-CM | POA: Diagnosis not present

## 2012-11-27 NOTE — Telephone Encounter (Signed)
Form given to pt's wife

## 2012-12-01 ENCOUNTER — Emergency Department: Payer: Self-pay | Admitting: Internal Medicine

## 2012-12-01 DIAGNOSIS — Z043 Encounter for examination and observation following other accident: Secondary | ICD-10-CM | POA: Diagnosis not present

## 2012-12-01 DIAGNOSIS — Z79899 Other long term (current) drug therapy: Secondary | ICD-10-CM | POA: Diagnosis not present

## 2012-12-01 DIAGNOSIS — M519 Unspecified thoracic, thoracolumbar and lumbosacral intervertebral disc disorder: Secondary | ICD-10-CM | POA: Diagnosis not present

## 2012-12-01 DIAGNOSIS — M545 Low back pain, unspecified: Secondary | ICD-10-CM | POA: Diagnosis not present

## 2012-12-01 DIAGNOSIS — IMO0002 Reserved for concepts with insufficient information to code with codable children: Secondary | ICD-10-CM | POA: Diagnosis not present

## 2012-12-01 DIAGNOSIS — T148XXA Other injury of unspecified body region, initial encounter: Secondary | ICD-10-CM | POA: Diagnosis not present

## 2012-12-01 DIAGNOSIS — G8929 Other chronic pain: Secondary | ICD-10-CM | POA: Diagnosis not present

## 2012-12-10 ENCOUNTER — Ambulatory Visit (INDEPENDENT_AMBULATORY_CARE_PROVIDER_SITE_OTHER): Payer: Medicare Other | Admitting: Adult Health

## 2012-12-10 ENCOUNTER — Encounter: Payer: Self-pay | Admitting: Adult Health

## 2012-12-10 VITALS — BP 110/70 | HR 73 | Temp 98.3°F | Resp 12 | Wt 193.5 lb

## 2012-12-10 DIAGNOSIS — J329 Chronic sinusitis, unspecified: Secondary | ICD-10-CM

## 2012-12-10 MED ORDER — AMOXICILLIN-POT CLAVULANATE 875-125 MG PO TABS: 1.0000 | ORAL_TABLET | Freq: Two times a day (BID) | ORAL | Status: DC

## 2012-12-10 NOTE — Progress Notes (Signed)
  Subjective:    Patient ID: [name], male    DOB: 12-Mar-1955, 57 y.o.   MRN: [id number]  HPI  Pt is pleasant 57 yo male, seen 9/23 for sinusitis and given abx which he completed. Pt states he started to get better but then his daughter developed the same sx and he thinks he may have gotten it again from her. Pt reports sinus pressure and cough with dark phlegm (yellowish-green) for over a week, developed sore throat 2 days ago. Denies fevers or chills. Pt has been taking Mucinex-D without relief in symptoms. He also has been using nasonex and irrigating sinuses with netti pot  Review of Systems  Constitutional: Negative for fever, chills and fatigue.  HENT: Positive for congestion, postnasal drip, sinus pressure and sore throat.   Respiratory: Positive for cough. Negative for chest tightness.   Cardiovascular: Negative for chest pain.       Objective:   Physical Exam  Constitutional: He is oriented to person, place, and time. He appears well-developed and well-nourished.  HENT:  Pharyngeal erythema without exudate. Thick drainage posterior pharynx  Cardiovascular: Normal rate, regular rhythm and normal heart sounds.   Pulmonary/Chest: Effort normal and breath sounds normal. No respiratory distress. He has no wheezes. He has no rales.  Neurological: He is alert and oriented to person, place, and time.  Skin: Skin is warm and dry.  Psychiatric: He has a normal mood and affect. His behavior is normal. Judgment and thought content normal.    BP 110/70  Pulse 73  Temp(Src) 98.3 F (36.8 C) (Oral)  Resp 12  Wt 193 lb 8 oz (87.771 kg)  SpO2 97%       Assessment & Plan:

## 2012-12-10 NOTE — Patient Instructions (Addendum)
Start Augmentin twice daily for 14 days.  Continue with your nasal spray and irrigation   Sinusitis is a condition that can cause a stuffy nose, pain in the face, and yellow or green discharge (mucus) from the nose. The sinuses are hollow areas in the bones of the face. They have a thin lining that normally makes a small amount of mucus. When this lining gets infected, it swells and makes extra mucus. This causes symptoms.   Sinusitis can occur when a person gets sick with a cold. The germs causing the cold can also infect the sinuses. Many times, a person feels like his or her cold is getting better. But then he or she gets sinusitis and begins to feel sick again.  What are the symptoms of sinusitis? - Common symptoms of sinusitis include:  Stuffy or blocked nose  Thick yellow or green discharge from the nose  Pain in the teeth  Pain or pressure in the face - This often feels worse when a person bends forward.   People with sinusitis can also have other symptoms that include:  Fever  Cough  Trouble smelling  Ear pressure or fullness  Headache  Bad breath  Feeling tired   Most of the time, symptoms start to improve in 7 to 10 days.  See your doctor or nurse if your symptoms last more than 7 days, or if your symptoms get better at first but then get worse.  Sometimes, sinusitis can lead to serious problems. See your doctor or nurse right away (do not wait 7 days) if you have:  Fever higher than 102.13F (39.2C)  Sudden and severe pain in the face and head  Trouble seeing or seeing double  Trouble thinking clearly  Swelling or redness around 1 or both eyes  Trouble breathing or a stiff neck   Is there anything I can do on my own to feel better? - Yes. To reduce your symptoms, you can: Take an over-the-counter pain reliever to reduce the pain  Rinse your nose and sinuses with salt water a few times a day - Ask your doctor or nurse about the best way to do this.  Use a  decongestant nose spray - These sprays are sold in a pharmacy. But do not use decongestant nose sprays for more than 2 to 3 days in a row. Using them more than 3 days in a row can make symptoms worse.   You should NOT take an antihistamine for sinusitis. Common antihistamines include diphenhydramine (sample brand name: Benadryl), chlorpheniramine (sample brand name: Chlor-Trimeton), loratadine (sample brand name: Claritin), and cetirizine (sample brand name: Zyrtec). They can treat allergies, but not sinus infections, and could increase your discomfort by drying the lining of your nose and sinuses, or making you tired. k  Your doctor might also prescribe a steroid nose spray to reduce the swelling in your nose. (Steroid nose sprays do not contain the same steroids that athletes tae to build muscle.)  How is sinusitis treated? - Most of the time, sinusitis does not need to be treated with antibiotic medicines. This is because most sinusitis is caused by viruses - not bacteria - and antibiotics do not kill viruses. Many people get over sinus infections without antibiotics.  Some people with sinusitis do need treatment with antibiotics. If your symptoms have not improved after 7 to 10 days, ask your doctor if you should take antibiotics. Your doctor might recommend that you wait 1 more week to see if your  symptoms improve. But if you have symptoms such as a fever or a lot of pain, he or she might prescribe antibiotics. It is important to follow your doctor's instructions about taking your antibiotics.

## 2012-12-10 NOTE — Assessment & Plan Note (Signed)
Augmentin twice a day x14 days. Continue supportive care with nasal sprays netti pot. May consider using  antihistamine once he completes antibiotics. RTC if symptoms are not improved within 4-5 days or sooner if necessary.

## 2012-12-14 DIAGNOSIS — M9981 Other biomechanical lesions of cervical region: Secondary | ICD-10-CM | POA: Diagnosis not present

## 2012-12-14 DIAGNOSIS — M999 Biomechanical lesion, unspecified: Secondary | ICD-10-CM | POA: Diagnosis not present

## 2012-12-14 DIAGNOSIS — M5020 Other cervical disc displacement, unspecified cervical region: Secondary | ICD-10-CM | POA: Diagnosis not present

## 2012-12-14 DIAGNOSIS — M5430 Sciatica, unspecified side: Secondary | ICD-10-CM | POA: Diagnosis not present

## 2012-12-17 DIAGNOSIS — M5430 Sciatica, unspecified side: Secondary | ICD-10-CM | POA: Diagnosis not present

## 2012-12-17 DIAGNOSIS — M9981 Other biomechanical lesions of cervical region: Secondary | ICD-10-CM | POA: Diagnosis not present

## 2012-12-17 DIAGNOSIS — M999 Biomechanical lesion, unspecified: Secondary | ICD-10-CM | POA: Diagnosis not present

## 2012-12-17 DIAGNOSIS — M5020 Other cervical disc displacement, unspecified cervical region: Secondary | ICD-10-CM | POA: Diagnosis not present

## 2012-12-21 DIAGNOSIS — M5430 Sciatica, unspecified side: Secondary | ICD-10-CM | POA: Diagnosis not present

## 2012-12-21 DIAGNOSIS — M999 Biomechanical lesion, unspecified: Secondary | ICD-10-CM | POA: Diagnosis not present

## 2012-12-21 DIAGNOSIS — M5020 Other cervical disc displacement, unspecified cervical region: Secondary | ICD-10-CM | POA: Diagnosis not present

## 2012-12-21 DIAGNOSIS — M9981 Other biomechanical lesions of cervical region: Secondary | ICD-10-CM | POA: Diagnosis not present

## 2012-12-24 DIAGNOSIS — M999 Biomechanical lesion, unspecified: Secondary | ICD-10-CM | POA: Diagnosis not present

## 2012-12-24 DIAGNOSIS — M5020 Other cervical disc displacement, unspecified cervical region: Secondary | ICD-10-CM | POA: Diagnosis not present

## 2012-12-24 DIAGNOSIS — M9981 Other biomechanical lesions of cervical region: Secondary | ICD-10-CM | POA: Diagnosis not present

## 2012-12-24 DIAGNOSIS — M5430 Sciatica, unspecified side: Secondary | ICD-10-CM | POA: Diagnosis not present

## 2012-12-31 DIAGNOSIS — M9981 Other biomechanical lesions of cervical region: Secondary | ICD-10-CM | POA: Diagnosis not present

## 2012-12-31 DIAGNOSIS — M5430 Sciatica, unspecified side: Secondary | ICD-10-CM | POA: Diagnosis not present

## 2012-12-31 DIAGNOSIS — M999 Biomechanical lesion, unspecified: Secondary | ICD-10-CM | POA: Diagnosis not present

## 2012-12-31 DIAGNOSIS — M5020 Other cervical disc displacement, unspecified cervical region: Secondary | ICD-10-CM | POA: Diagnosis not present

## 2013-01-07 DIAGNOSIS — M999 Biomechanical lesion, unspecified: Secondary | ICD-10-CM | POA: Diagnosis not present

## 2013-01-07 DIAGNOSIS — M5020 Other cervical disc displacement, unspecified cervical region: Secondary | ICD-10-CM | POA: Diagnosis not present

## 2013-01-07 DIAGNOSIS — M9981 Other biomechanical lesions of cervical region: Secondary | ICD-10-CM | POA: Diagnosis not present

## 2013-01-07 DIAGNOSIS — M5430 Sciatica, unspecified side: Secondary | ICD-10-CM | POA: Diagnosis not present

## 2013-01-17 DIAGNOSIS — M999 Biomechanical lesion, unspecified: Secondary | ICD-10-CM | POA: Diagnosis not present

## 2013-01-17 DIAGNOSIS — M5430 Sciatica, unspecified side: Secondary | ICD-10-CM | POA: Diagnosis not present

## 2013-01-17 DIAGNOSIS — M5020 Other cervical disc displacement, unspecified cervical region: Secondary | ICD-10-CM | POA: Diagnosis not present

## 2013-01-17 DIAGNOSIS — M9981 Other biomechanical lesions of cervical region: Secondary | ICD-10-CM | POA: Diagnosis not present

## 2013-01-23 DIAGNOSIS — M999 Biomechanical lesion, unspecified: Secondary | ICD-10-CM | POA: Diagnosis not present

## 2013-01-23 DIAGNOSIS — M5020 Other cervical disc displacement, unspecified cervical region: Secondary | ICD-10-CM | POA: Diagnosis not present

## 2013-01-23 DIAGNOSIS — M5430 Sciatica, unspecified side: Secondary | ICD-10-CM | POA: Diagnosis not present

## 2013-01-23 DIAGNOSIS — M9981 Other biomechanical lesions of cervical region: Secondary | ICD-10-CM | POA: Diagnosis not present

## 2013-02-04 DIAGNOSIS — M9981 Other biomechanical lesions of cervical region: Secondary | ICD-10-CM | POA: Diagnosis not present

## 2013-02-04 DIAGNOSIS — M5020 Other cervical disc displacement, unspecified cervical region: Secondary | ICD-10-CM | POA: Diagnosis not present

## 2013-02-04 DIAGNOSIS — M999 Biomechanical lesion, unspecified: Secondary | ICD-10-CM | POA: Diagnosis not present

## 2013-02-04 DIAGNOSIS — M5430 Sciatica, unspecified side: Secondary | ICD-10-CM | POA: Diagnosis not present

## 2013-02-15 DIAGNOSIS — M5430 Sciatica, unspecified side: Secondary | ICD-10-CM | POA: Diagnosis not present

## 2013-02-15 DIAGNOSIS — M5020 Other cervical disc displacement, unspecified cervical region: Secondary | ICD-10-CM | POA: Diagnosis not present

## 2013-02-15 DIAGNOSIS — M9981 Other biomechanical lesions of cervical region: Secondary | ICD-10-CM | POA: Diagnosis not present

## 2013-02-15 DIAGNOSIS — M999 Biomechanical lesion, unspecified: Secondary | ICD-10-CM | POA: Diagnosis not present

## 2013-02-22 DIAGNOSIS — M9981 Other biomechanical lesions of cervical region: Secondary | ICD-10-CM | POA: Diagnosis not present

## 2013-02-22 DIAGNOSIS — M999 Biomechanical lesion, unspecified: Secondary | ICD-10-CM | POA: Diagnosis not present

## 2013-02-22 DIAGNOSIS — M5020 Other cervical disc displacement, unspecified cervical region: Secondary | ICD-10-CM | POA: Diagnosis not present

## 2013-02-22 DIAGNOSIS — M5430 Sciatica, unspecified side: Secondary | ICD-10-CM | POA: Diagnosis not present

## 2013-03-04 DIAGNOSIS — M5430 Sciatica, unspecified side: Secondary | ICD-10-CM | POA: Diagnosis not present

## 2013-03-04 DIAGNOSIS — M5020 Other cervical disc displacement, unspecified cervical region: Secondary | ICD-10-CM | POA: Diagnosis not present

## 2013-03-04 DIAGNOSIS — M9981 Other biomechanical lesions of cervical region: Secondary | ICD-10-CM | POA: Diagnosis not present

## 2013-03-04 DIAGNOSIS — M999 Biomechanical lesion, unspecified: Secondary | ICD-10-CM | POA: Diagnosis not present

## 2013-03-08 ENCOUNTER — Encounter: Payer: Self-pay | Admitting: Adult Health

## 2013-03-08 ENCOUNTER — Telehealth: Payer: Self-pay | Admitting: Emergency Medicine

## 2013-03-08 ENCOUNTER — Other Ambulatory Visit: Payer: Self-pay | Admitting: Adult Health

## 2013-03-08 ENCOUNTER — Ambulatory Visit (INDEPENDENT_AMBULATORY_CARE_PROVIDER_SITE_OTHER): Payer: Medicare Other | Admitting: Adult Health

## 2013-03-08 VITALS — BP 110/80 | HR 83 | Resp 12 | Wt 196.0 lb

## 2013-03-08 DIAGNOSIS — M4802 Spinal stenosis, cervical region: Secondary | ICD-10-CM

## 2013-03-08 DIAGNOSIS — M5020 Other cervical disc displacement, unspecified cervical region: Secondary | ICD-10-CM | POA: Diagnosis not present

## 2013-03-08 DIAGNOSIS — D179 Benign lipomatous neoplasm, unspecified: Secondary | ICD-10-CM | POA: Diagnosis not present

## 2013-03-08 DIAGNOSIS — M5430 Sciatica, unspecified side: Secondary | ICD-10-CM | POA: Diagnosis not present

## 2013-03-08 DIAGNOSIS — M999 Biomechanical lesion, unspecified: Secondary | ICD-10-CM | POA: Diagnosis not present

## 2013-03-08 DIAGNOSIS — D1779 Benign lipomatous neoplasm of other sites: Secondary | ICD-10-CM

## 2013-03-08 DIAGNOSIS — D171 Benign lipomatous neoplasm of skin and subcutaneous tissue of trunk: Secondary | ICD-10-CM | POA: Insufficient documentation

## 2013-03-08 DIAGNOSIS — M9981 Other biomechanical lesions of cervical region: Secondary | ICD-10-CM | POA: Diagnosis not present

## 2013-03-08 NOTE — Progress Notes (Signed)
Pre visit review using our clinic review tool, if applicable. No additional management support is needed unless otherwise documented below in the visit note. 

## 2013-03-08 NOTE — Progress Notes (Signed)
Subjective:    Patient ID: [name]., male    DOB: [birth year], 58 y.o.   MRN: [id number]  HPI  Pt is a pleasant 58 y/o male who presents to clinic with neck pain, right shoulder pain that has been ongoing x 2 months. He also has a cyst on his right upper back. He has a hx of cervical spinal stenosis seen on MRI (2007). At the time, he did not wish to pursue any aggressive treatment. He has been experiencing progressive symptoms of neck pain and pain/numbness of his left arm and fingers (middle, ring & pinky) as well as numbness of the right hand fingers (ring and pinky). Pt would like a referral to neurosurgeon for evaluation. He is requesting [facility].  Pertaining to his "cyst" on the right upper back, this is beginning to bother him. He has noticed it increase in size. Reports that his mother had one removed from her hip and it was a lot larger than what it appeared. He would like to have this evaluated for removal.    Past Medical History  Diagnosis Date  . H/O diverticulitis of colon   . GERD (gastroesophageal reflux disease)   . Hyperlipidemia   . Colon polyps   . Diverticulosis   . Bulging disc     C2/3, C3/4, C6/7  . Cervical disc herniation     C4/5 and C5/6  . Spinal stenosis in cervical region     cord abutment C4/5  . Bulge of lumbar disc without myelopathy     L2/3 through L5/6  . Levoscoliosis      Past Surgical History  Procedure Laterality Date  . Ganglion cyst excision Right 1994    wrist  . Hernia repair Left 1994    abdominal repair with mesh  . Nasal septum surgery  2011    Dr. [name]      Family History  Problem Relation Age of Onset  . Hyperlipidemia Mother   . Cancer Mother     skin cancer?  . Heart disease Mother 6    MI - died in her sleep  . Hyperlipidemia Father   . Heart disease Father      History   Social History  . Marital Status: Married    Spouse Name: Zabria Liss    Number of Children: 1  . Years of Education: GED    Occupational History  . Disability     Herniated disk, spinal stenosis   Social History Main Topics  . Smoking status: Former Smoker -- 15 years    Types: Cigarettes    Quit date: 02/08/1991  . Smokeless tobacco: Never Used  . Alcohol Use: No  . Drug Use: No  . Sexual Activity: Yes    Partners: Female   Other Topics Concern  . Not on file   Social History Narrative   Cameo was born and raised in [place], [place]. He quit school after the 8th grade because of financial hardship. He was working since age 33. He went back and got his GED. He moved to [place] in 2006. He lives at home with his wife of 28 years and their 75 year old daughter. Previously, Forney was married to his first wife for 20 years and they divorced. They had no children.  Daivik has been disabled 2/2 back problems.      Review of Systems  Musculoskeletal: Positive for back pain and neck pain.       Pain and numbness  of left arm and fingers, right hand fingers  Neurological: Positive for numbness.  All other systems reviewed and are negative.       Objective:   Physical Exam  Constitutional: He is oriented to person, place, and time. He appears well-developed and well-nourished. No distress.  Cardiovascular: Normal rate and regular rhythm.   Pulmonary/Chest: Effort normal. No respiratory distress.  Musculoskeletal: He exhibits no edema.  Large, soft, movable mass on upper right side of back appears to be lipoma  Neurological: He is alert and oriented to person, place, and time. Coordination normal.  Psychiatric: He has a normal mood and affect. His behavior is normal. Judgment and thought content normal.          Assessment & Plan:

## 2013-03-08 NOTE — Assessment & Plan Note (Signed)
Right upper back. Increasing in size. Bothersome to pt. Refer to Gen Surgery - Dr. Bary Castilla

## 2013-03-08 NOTE — Assessment & Plan Note (Signed)
Progressing symptoms of pain and numbness of left arm and fingers. Also some numbness of right hand fingers. Has had MRI and full work up while living in [place]. Last MRI 2007. Pt has actual films. We can send copy of report. Referral to [facility] and will have them decide on additional films. Note, pt has severe claustrophobia and will need a stand up MRI.

## 2013-03-11 ENCOUNTER — Encounter: Payer: Self-pay | Admitting: Emergency Medicine

## 2013-03-12 ENCOUNTER — Telehealth: Payer: Self-pay | Admitting: Adult Health

## 2013-03-12 NOTE — Telephone Encounter (Signed)
Pt called he has mri schedule for Friday am.  Pt would like to get something to calm him down before having mri [facility]

## 2013-03-13 ENCOUNTER — Other Ambulatory Visit: Payer: Self-pay | Admitting: Adult Health

## 2013-03-13 MED ORDER — ALPRAZOLAM 1 MG PO TABS: ORAL_TABLET | ORAL | Status: DC

## 2013-03-13 NOTE — Telephone Encounter (Signed)
Pt notified.    Rx faxed to pharmacy.

## 2013-03-15 DIAGNOSIS — M4802 Spinal stenosis, cervical region: Secondary | ICD-10-CM | POA: Diagnosis not present

## 2013-03-18 ENCOUNTER — Telehealth: Payer: Self-pay | Admitting: Adult Health

## 2013-03-18 ENCOUNTER — Other Ambulatory Visit: Payer: Self-pay | Admitting: Adult Health

## 2013-03-18 ENCOUNTER — Other Ambulatory Visit (INDEPENDENT_AMBULATORY_CARE_PROVIDER_SITE_OTHER): Payer: Medicare Other

## 2013-03-18 DIAGNOSIS — E041 Nontoxic single thyroid nodule: Secondary | ICD-10-CM

## 2013-03-18 NOTE — Telephone Encounter (Signed)
Wanting MRI results

## 2013-03-18 NOTE — Telephone Encounter (Signed)
Pt notified of MRI results. Advised copy left up front for pickup. Pt ok with thyroid ultrasound and bloodwork, will schedule lab appointment when picks up results.

## 2013-03-18 NOTE — Telephone Encounter (Signed)
He can come by and pick up results. They noticed thyroid nodules so I am sending him to have ultrasound of thyroid done. I want to run some thyroid labs as well

## 2013-03-19 ENCOUNTER — Encounter: Payer: Self-pay | Admitting: Adult Health

## 2013-03-19 ENCOUNTER — Ambulatory Visit: Payer: Self-pay | Admitting: Adult Health

## 2013-03-19 DIAGNOSIS — E041 Nontoxic single thyroid nodule: Secondary | ICD-10-CM | POA: Diagnosis not present

## 2013-03-19 DIAGNOSIS — E042 Nontoxic multinodular goiter: Secondary | ICD-10-CM | POA: Diagnosis not present

## 2013-03-19 LAB — T3, FREE: T3, Free: 2.8 pg/mL (ref 2.3–4.2)

## 2013-03-19 LAB — T4, FREE: Free T4: 1.12 ng/dL (ref 0.60–1.60)

## 2013-03-19 LAB — TSH: TSH: 0.61 u[IU]/mL (ref 0.35–5.50)

## 2013-03-20 DIAGNOSIS — M999 Biomechanical lesion, unspecified: Secondary | ICD-10-CM | POA: Diagnosis not present

## 2013-03-20 DIAGNOSIS — M5430 Sciatica, unspecified side: Secondary | ICD-10-CM | POA: Diagnosis not present

## 2013-03-20 DIAGNOSIS — M5020 Other cervical disc displacement, unspecified cervical region: Secondary | ICD-10-CM | POA: Diagnosis not present

## 2013-03-20 DIAGNOSIS — M9981 Other biomechanical lesions of cervical region: Secondary | ICD-10-CM | POA: Diagnosis not present

## 2013-03-21 ENCOUNTER — Other Ambulatory Visit: Payer: Self-pay | Admitting: Adult Health

## 2013-03-21 NOTE — Telephone Encounter (Signed)
We dont have any labs besides thyroid, do you want him to come in for any labs before refilling

## 2013-03-21 NOTE — Telephone Encounter (Signed)
Yes I would like to check his cholesterol and cmet before refilling. He can just come in to have the labs done.

## 2013-03-25 ENCOUNTER — Other Ambulatory Visit: Payer: Self-pay | Admitting: Adult Health

## 2013-03-25 ENCOUNTER — Telehealth: Payer: Self-pay | Admitting: *Deleted

## 2013-03-25 DIAGNOSIS — M542 Cervicalgia: Secondary | ICD-10-CM | POA: Diagnosis not present

## 2013-03-25 DIAGNOSIS — Z79899 Other long term (current) drug therapy: Secondary | ICD-10-CM

## 2013-03-25 NOTE — Telephone Encounter (Signed)
Pt walked into office, states he had been at Dr. Valinda Hoar office at [facility] this morning, they told him his blood pressure was elevated and to notify his PCP. Pt states he checked his BP at home 117/90 HR 100 and came to office. Pt denies any chest pain, SOB, headache. States same pain he always has in back and neck. States he was a little unhappy at Dr. Valinda Hoar appointment as he didn't address all his pain needs. Checked BP here 126/80, HR 92, O2 96%. Advised pt to continue to monitor BPs at home and notify office with elevated readings. [name] notified.

## 2013-03-27 ENCOUNTER — Encounter: Payer: Self-pay | Admitting: Adult Health

## 2013-03-27 ENCOUNTER — Other Ambulatory Visit (INDEPENDENT_AMBULATORY_CARE_PROVIDER_SITE_OTHER): Payer: Medicare Other

## 2013-03-27 DIAGNOSIS — M999 Biomechanical lesion, unspecified: Secondary | ICD-10-CM | POA: Diagnosis not present

## 2013-03-27 DIAGNOSIS — Z79899 Other long term (current) drug therapy: Secondary | ICD-10-CM | POA: Diagnosis not present

## 2013-03-27 DIAGNOSIS — M5020 Other cervical disc displacement, unspecified cervical region: Secondary | ICD-10-CM | POA: Diagnosis not present

## 2013-03-27 DIAGNOSIS — M9981 Other biomechanical lesions of cervical region: Secondary | ICD-10-CM | POA: Diagnosis not present

## 2013-03-27 DIAGNOSIS — M5430 Sciatica, unspecified side: Secondary | ICD-10-CM | POA: Diagnosis not present

## 2013-03-27 LAB — LIPID PANEL
Cholesterol: 166 mg/dL (ref 0–200)
HDL: 47 mg/dL (ref >39.00)
LDL Cholesterol: 103 mg/dL — ABNORMAL HIGH (ref 0–99)
Total CHOL/HDL Ratio: 4
Triglycerides: 80 mg/dL (ref 0.0–149.0)
VLDL: 16 mg/dL (ref 0.0–40.0)

## 2013-03-27 LAB — COMPREHENSIVE METABOLIC PANEL
ALT: 18 U/L (ref 0–53)
AST: 20 U/L (ref 0–37)
Albumin: 4 g/dL (ref 3.5–5.2)
Alkaline Phosphatase: 51 U/L (ref 39–117)
BUN: 15 mg/dL (ref 6–23)
CO2: 28 mEq/L (ref 19–32)
Calcium: 9.7 mg/dL (ref 8.4–10.5)
Chloride: 102 mEq/L (ref 96–112)
Creatinine, Ser: 0.9 mg/dL (ref 0.4–1.5)
GFR: 98.78 mL/min (ref >60.00)
Glucose, Bld: 84 mg/dL (ref 70–99)
Potassium: 4.5 mEq/L (ref 3.5–5.1)
Sodium: 138 mEq/L (ref 135–145)
Total Bilirubin: 1.5 mg/dL — ABNORMAL HIGH (ref 0.3–1.2)
Total Protein: 6.7 g/dL (ref 6.0–8.3)

## 2013-04-01 DIAGNOSIS — M5430 Sciatica, unspecified side: Secondary | ICD-10-CM | POA: Diagnosis not present

## 2013-04-01 DIAGNOSIS — M5020 Other cervical disc displacement, unspecified cervical region: Secondary | ICD-10-CM | POA: Diagnosis not present

## 2013-04-01 DIAGNOSIS — M9981 Other biomechanical lesions of cervical region: Secondary | ICD-10-CM | POA: Diagnosis not present

## 2013-04-01 DIAGNOSIS — M999 Biomechanical lesion, unspecified: Secondary | ICD-10-CM | POA: Diagnosis not present

## 2013-04-02 ENCOUNTER — Ambulatory Visit: Payer: Self-pay | Admitting: General Surgery

## 2013-04-02 ENCOUNTER — Encounter: Payer: Self-pay | Admitting: General Surgery

## 2013-04-02 ENCOUNTER — Ambulatory Visit (INDEPENDENT_AMBULATORY_CARE_PROVIDER_SITE_OTHER): Payer: Medicare Other | Admitting: General Surgery

## 2013-04-02 VITALS — BP 120/80 | HR 74 | Resp 12 | Ht 70.0 in | Wt 195.0 lb

## 2013-04-02 DIAGNOSIS — D171 Benign lipomatous neoplasm of skin and subcutaneous tissue of trunk: Secondary | ICD-10-CM

## 2013-04-02 DIAGNOSIS — D1779 Benign lipomatous neoplasm of other sites: Secondary | ICD-10-CM

## 2013-04-02 NOTE — Patient Instructions (Signed)
Patient to return for excision of lipoma on back. Patient to call with any new questions or concerns.

## 2013-04-02 NOTE — Progress Notes (Signed)
Patient ID: [name]., male   DOB: [birth year], 58 y.o.   MRN: [id number]  Chief Complaint  Patient presents with  . Other    New Patient evaluation of lipoma on back    HPI [name]. is a 58 y.o. male here today for an evaluation of an lipoma on back. Patient states it has been there for about 2 years. He states that the area is tender to the touch. It has gotten bigger gradually.   HPI  Past Medical History  Diagnosis Date  . H/O diverticulitis of colon   . GERD (gastroesophageal reflux disease)   . Hyperlipidemia   . Colon polyps   . Diverticulosis   . Bulging disc     C2/3, C3/4, C6/7  . Cervical disc herniation     C4/5 and C5/6  . Spinal stenosis in cervical region     cord abutment C4/5  . Bulge of lumbar disc without myelopathy     L2/3 through L5/6  . Levoscoliosis   . Hemorrhoid   . Hiatal hernia   . Scoliosis     Past Surgical History  Procedure Laterality Date  . Ganglion cyst excision Right 1994    wrist  . Hernia repair Left 1994    abdominal repair with mesh  . Nasal septum surgery  2011    Dr. [name]     Family History  Problem Relation Age of Onset  . Hyperlipidemia Mother   . Cancer Mother     skin cancer?  . Heart disease Mother 24    MI - died in her sleep  . Hyperlipidemia Father   . Heart disease Father     Social History History  Substance Use Topics  . Smoking status: Former Smoker -- 20 years    Types: Cigarettes    Quit date: 02/08/1991  . Smokeless tobacco: Never Used  . Alcohol Use: No    No Known Allergies  Current Outpatient Prescriptions  Medication Sig Dispense Refill  . atorvastatin (LIPITOR) 10 MG tablet TAKE 1 TABLET DAILY  90 tablet  1  . cyclobenzaprine (FLEXERIL) 10 MG tablet Take 10 mg by mouth 3 (three) times daily as needed.       . pantoprazole (PROTONIX) 40 MG tablet Take 1 tablet (40 mg total) by mouth daily.  90 tablet  3  . triamcinolone (NASACORT) 55 MCG/ACT nasal inhaler Place 2 sprays  into the nose daily.      [name] zolpidem (AMBIEN) 5 MG tablet Take 5 mg by mouth at bedtime as needed.       . ALPRAZolam (XANAX) 1 MG tablet Take 1 tablet 30-45 min prior to procedure.  10 tablet  0  . CIALIS 5 MG tablet Take 5 mg by mouth daily as needed.        No current facility-administered medications for this visit.    Review of Systems Review of Systems  Constitutional: Negative.   Respiratory: Negative.   Cardiovascular: Negative.     Blood pressure 120/80, pulse 74, resp. rate 12, height 5\' 10"  (1.778 m), weight 195 lb (88.451 kg).  Physical Exam Physical Exam  Constitutional: He is oriented to person, place, and time. He appears well-developed and well-nourished.  Cardiovascular: Normal rate, regular rhythm and normal heart sounds.   No murmur heard. Pulmonary/Chest: Effort normal and breath sounds normal.  Neurological: He is alert and oriented to person, place, and time.  Skin: Skin is warm and dry.  5  x 5 cm soft tissue mass medial to the right scapula.    Assessment    Symptomatic lipoma.    Plan    Excision under local anesthesia will be scheduled at a convenient time.       [name] 04/02/2013, 4:36 PM

## 2013-04-19 DIAGNOSIS — S335XXA Sprain of ligaments of lumbar spine, initial encounter: Secondary | ICD-10-CM | POA: Diagnosis not present

## 2013-04-19 DIAGNOSIS — S139XXA Sprain of joints and ligaments of unspecified parts of neck, initial encounter: Secondary | ICD-10-CM | POA: Diagnosis not present

## 2013-04-22 DIAGNOSIS — R059 Cough, unspecified: Secondary | ICD-10-CM | POA: Diagnosis not present

## 2013-04-22 DIAGNOSIS — R05 Cough: Secondary | ICD-10-CM | POA: Diagnosis not present

## 2013-04-22 DIAGNOSIS — J018 Other acute sinusitis: Secondary | ICD-10-CM | POA: Diagnosis not present

## 2013-04-23 ENCOUNTER — Ambulatory Visit (INDEPENDENT_AMBULATORY_CARE_PROVIDER_SITE_OTHER): Payer: Medicare Other | Admitting: General Surgery

## 2013-04-23 ENCOUNTER — Ambulatory Visit: Payer: Medicare Other | Admitting: Adult Health

## 2013-04-23 ENCOUNTER — Encounter: Payer: Self-pay | Admitting: General Surgery

## 2013-04-23 VITALS — BP 140/76 | HR 76 | Resp 12 | Ht 69.0 in | Wt 195.0 lb

## 2013-04-23 DIAGNOSIS — D481 Neoplasm of uncertain behavior of connective and other soft tissue: Secondary | ICD-10-CM | POA: Diagnosis not present

## 2013-04-23 DIAGNOSIS — D171 Benign lipomatous neoplasm of skin and subcutaneous tissue of trunk: Secondary | ICD-10-CM

## 2013-04-23 DIAGNOSIS — D485 Neoplasm of uncertain behavior of skin: Secondary | ICD-10-CM

## 2013-04-23 NOTE — Progress Notes (Signed)
Patient ID: Tyler Loop., male   DOB: [birth year], 58 y.o.   MRN: [id number]  Chief Complaint  Patient presents with  . Procedure    excision of lipoma on back.     HPI [name]. is a 58 y.o. male who presents for a procedure. The procedure performed is an excision of a lipoma on his back.  HPI  Past Medical History  Diagnosis Date  . H/O diverticulitis of colon   . GERD (gastroesophageal reflux disease)   . Hyperlipidemia   . Colon polyps   . Diverticulosis   . Bulging disc     C2/3, C3/4, C6/7  . Cervical disc herniation     C4/5 and C5/6  . Spinal stenosis in cervical region     cord abutment C4/5  . Bulge of lumbar disc without myelopathy     L2/3 through L5/6  . Levoscoliosis   . Hemorrhoid   . Hiatal hernia   . Scoliosis     Past Surgical History  Procedure Laterality Date  . Ganglion cyst excision Right 1994    wrist  . Hernia repair Left 1994    abdominal repair with mesh  . Nasal septum surgery  2011    Dr. [name]     Family History  Problem Relation Age of Onset  . Hyperlipidemia Mother   . Cancer Mother     skin cancer?  . Heart disease Mother 21    MI - died in her sleep  . Hyperlipidemia Father   . Heart disease Father     Social History History  Substance Use Topics  . Smoking status: Former Smoker -- 20 years    Types: Cigarettes    Quit date: 02/08/1991  . Smokeless tobacco: Never Used  . Alcohol Use: No    Allergies  Allergen Reactions  . Other Other (See Comments)    Pollen:  Sinus infection, cough, fatigue    Current Outpatient Prescriptions  Medication Sig Dispense Refill  . atorvastatin (LIPITOR) 10 MG tablet Take 1 tablet by mouth daily.      . cyclobenzaprine (FLEXERIL) 10 MG tablet Take 10 mg by mouth 3 (three) times daily as needed.       . pantoprazole (PROTONIX) 40 MG tablet Take 1 tablet (40 mg total) by mouth daily.  90 tablet  3  . triamcinolone (NASACORT) 55 MCG/ACT AERO nasal inhaler USE 2 SPRAYS IN  EACH NOSTRIL DAILY      . zolpidem (AMBIEN) 5 MG tablet Take 5 mg by mouth at bedtime as needed.        No current facility-administered medications for this visit.    Review of Systems Review of Systems  Constitutional: Negative.   Respiratory: Negative.   Cardiovascular: Negative.     Blood pressure 140/76, pulse 76, resp. rate 12, height 5\' 9"  (1.753 m), weight 195 lb (88.451 kg).  Physical Exam Physical Exam A 5 x 5 cm lipoma on the right posterior shoulder is identified, unchanged from past exams.    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     Plan    The area was cleansed with alcohol a total of 20 cc of 0.5% Xylocaine with 0.25% Marcaine with 1-200,000 units of epinephrine was instilled well tolerated. ChloraPrep was applied to the skin. A transverse incision was made and the lipoma was excised. It extended down to but did not invade the underlying muscle fascia. Hemostasis was with 3-0  Vicryl ties. The deep tissue was approximated with 3-0 Vicryl and the skin closed with a running 3-0 Vicryl subcuticular suture. Benzoin and Steri-Strips followed by Telfa Tegaderm dressing was applied. The procedure was well tolerated. Instructions regarding postoperative wound care were reviewed. The patient will return in one week for assessment by the staff.        Robert Bellow 04/24/2013, 6:25 AM

## 2013-04-23 NOTE — Patient Instructions (Signed)
Patient to return in 1 week for nurse visit. The patient is aware to call back for any questions or concerns.  

## 2013-04-30 ENCOUNTER — Ambulatory Visit (INDEPENDENT_AMBULATORY_CARE_PROVIDER_SITE_OTHER): Payer: Medicare Other | Admitting: *Deleted

## 2013-04-30 DIAGNOSIS — D1779 Benign lipomatous neoplasm of other sites: Secondary | ICD-10-CM

## 2013-04-30 DIAGNOSIS — D171 Benign lipomatous neoplasm of skin and subcutaneous tissue of trunk: Secondary | ICD-10-CM

## 2013-04-30 NOTE — Progress Notes (Signed)
Patient came in today for a wound check excision lipoma on back.  The wound is clean, with no signs of infection noted. Follow up as scheduled.

## 2013-05-16 ENCOUNTER — Other Ambulatory Visit: Payer: Self-pay | Admitting: Adult Health

## 2013-05-16 NOTE — Telephone Encounter (Signed)
Okay to refill? 

## 2013-05-16 NOTE — Telephone Encounter (Signed)
OK to fill

## 2013-05-17 ENCOUNTER — Other Ambulatory Visit: Payer: Self-pay | Admitting: *Deleted

## 2013-05-17 MED ORDER — ZOLPIDEM TARTRATE 5 MG PO TABS: 5.0000 mg | ORAL_TABLET | Freq: Every evening | ORAL | Status: DC | PRN

## 2013-05-17 NOTE — Telephone Encounter (Signed)
Ok to fill 

## 2013-05-17 NOTE — Telephone Encounter (Signed)
Yes Ok to refill.

## 2013-05-20 NOTE — Telephone Encounter (Signed)
Duplicate request

## 2013-05-20 NOTE — Telephone Encounter (Signed)
[name]'s patient

## 2013-06-06 ENCOUNTER — Encounter: Payer: Self-pay | Admitting: Adult Health

## 2013-06-06 ENCOUNTER — Ambulatory Visit (INDEPENDENT_AMBULATORY_CARE_PROVIDER_SITE_OTHER): Payer: Medicare Other | Admitting: Adult Health

## 2013-06-06 VITALS — BP 110/72 | HR 77 | Temp 97.9°F | Resp 12 | Wt 192.5 lb

## 2013-06-06 DIAGNOSIS — J029 Acute pharyngitis, unspecified: Secondary | ICD-10-CM | POA: Diagnosis not present

## 2013-06-06 DIAGNOSIS — N509 Disorder of male genital organs, unspecified: Secondary | ICD-10-CM

## 2013-06-06 DIAGNOSIS — Z79899 Other long term (current) drug therapy: Secondary | ICD-10-CM | POA: Diagnosis not present

## 2013-06-06 DIAGNOSIS — N50812 Left testicular pain: Secondary | ICD-10-CM

## 2013-06-06 LAB — POCT RAPID STREP A (OFFICE): Rapid Strep A Screen: NEGATIVE

## 2013-06-06 NOTE — Progress Notes (Signed)
Pre visit review using our clinic review tool, if applicable. No additional management support is needed unless otherwise documented below in the visit note. 

## 2013-06-06 NOTE — Progress Notes (Signed)
   Subjective:    Patient ID: [name]., male    DOB: October 20, 1955, 58 y.o.   MRN: [id number]  HPI  Pt is a pleasant 58 y/o male who presents to clinic with two concerns:  1) Sore throat - that has been ongoing for several days. Has hx of frequent sinus infections but no sinus problems at the present time. He has gone to urgent care and was started on antibiotic. His symptoms improved but now they are back. He does not believe this is allergy related. Recent endoscopy which was normal. Takes PPI.  2) Hx of epididymitis in the past. Reports having same symptoms. Some hesitancy only at night time. No fever or chills. Also has hx of left inguinal hernia repair with mesh in place. Has some discomfort in the area. Pt has been followed by Dr. Bernardo Heater.  Past Medical History  Diagnosis Date  . H/O diverticulitis of colon   . GERD (gastroesophageal reflux disease)   . Hyperlipidemia   . Colon polyps   . Diverticulosis   . Bulging disc     C2/3, C3/4, C6/7  . Cervical disc herniation     C4/5 and C5/6  . Spinal stenosis in cervical region     cord abutment C4/5  . Bulge of lumbar disc without myelopathy     L2/3 through L5/6  . Levoscoliosis   . Hemorrhoid   . Hiatal hernia   . Scoliosis     Review of Systems  Constitutional: Negative for fever and chills.  HENT: Positive for postnasal drip and sore throat.   Respiratory: Positive for cough. Negative for shortness of breath and wheezing.   Gastrointestinal: Negative.   Genitourinary: Positive for testicular pain (left testicular pain).       Hesitancy at night time  All other systems reviewed and are negative.      Objective:   Physical Exam  Constitutional: He is oriented to person, place, and time. He appears well-developed and well-nourished. No distress.  HENT:  Head: Normocephalic and atraumatic.  Mouth/Throat: No oropharyngeal exudate.  Eyes: Conjunctivae and EOM are normal.  Neck: Normal range of motion. Neck  supple.  Cardiovascular: Normal rate and regular rhythm.   Pulmonary/Chest: Effort normal. No respiratory distress.  Genitourinary:  C/o left groin pain/discomfort. Hx of hernia s/p surgery. Hx of epididymitis   Musculoskeletal: Normal range of motion.  Lymphadenopathy:    He has no cervical adenopathy.  Neurological: He is alert and oriented to person, place, and time.  Skin: Skin is warm and dry.  Psychiatric: He has a normal mood and affect. His behavior is normal. Judgment and thought content normal.       Assessment & Plan:   1. Sore throat Rapid strep test negative. Suspect this is post nasal drip. Suggested that he take OTC antihistamine to see if symptoms improved. If no improvement will refer to ENT - POCT rapid strep A  2. Testicular pain, left Left testicle is painful to touch Start levaquin 500 mg x 10 days.

## 2013-06-07 ENCOUNTER — Telehealth: Payer: Self-pay | Admitting: *Deleted

## 2013-06-07 ENCOUNTER — Ambulatory Visit: Payer: Medicare Other | Admitting: Adult Health

## 2013-06-07 MED ORDER — LEVOFLOXACIN 500 MG PO TABS: 500.0000 mg | ORAL_TABLET | Freq: Every day | ORAL | Status: DC

## 2013-06-07 NOTE — Telephone Encounter (Signed)
Called Levaquin to the pharmacy

## 2013-06-10 DIAGNOSIS — M999 Biomechanical lesion, unspecified: Secondary | ICD-10-CM | POA: Diagnosis not present

## 2013-06-10 DIAGNOSIS — M5430 Sciatica, unspecified side: Secondary | ICD-10-CM | POA: Diagnosis not present

## 2013-06-10 DIAGNOSIS — M5020 Other cervical disc displacement, unspecified cervical region: Secondary | ICD-10-CM | POA: Diagnosis not present

## 2013-06-10 DIAGNOSIS — M9981 Other biomechanical lesions of cervical region: Secondary | ICD-10-CM | POA: Diagnosis not present

## 2013-06-17 DIAGNOSIS — M999 Biomechanical lesion, unspecified: Secondary | ICD-10-CM | POA: Diagnosis not present

## 2013-06-17 DIAGNOSIS — M9981 Other biomechanical lesions of cervical region: Secondary | ICD-10-CM | POA: Diagnosis not present

## 2013-06-17 DIAGNOSIS — M5430 Sciatica, unspecified side: Secondary | ICD-10-CM | POA: Diagnosis not present

## 2013-06-17 DIAGNOSIS — M5020 Other cervical disc displacement, unspecified cervical region: Secondary | ICD-10-CM | POA: Diagnosis not present

## 2013-06-24 DIAGNOSIS — M5020 Other cervical disc displacement, unspecified cervical region: Secondary | ICD-10-CM | POA: Diagnosis not present

## 2013-06-24 DIAGNOSIS — M5430 Sciatica, unspecified side: Secondary | ICD-10-CM | POA: Diagnosis not present

## 2013-06-24 DIAGNOSIS — M9981 Other biomechanical lesions of cervical region: Secondary | ICD-10-CM | POA: Diagnosis not present

## 2013-06-24 DIAGNOSIS — M999 Biomechanical lesion, unspecified: Secondary | ICD-10-CM | POA: Diagnosis not present

## 2013-07-08 DIAGNOSIS — M5020 Other cervical disc displacement, unspecified cervical region: Secondary | ICD-10-CM | POA: Diagnosis not present

## 2013-07-08 DIAGNOSIS — M999 Biomechanical lesion, unspecified: Secondary | ICD-10-CM | POA: Diagnosis not present

## 2013-07-08 DIAGNOSIS — M5430 Sciatica, unspecified side: Secondary | ICD-10-CM | POA: Diagnosis not present

## 2013-07-08 DIAGNOSIS — M9981 Other biomechanical lesions of cervical region: Secondary | ICD-10-CM | POA: Diagnosis not present

## 2013-07-09 ENCOUNTER — Encounter: Payer: Self-pay | Admitting: Adult Health

## 2013-07-15 DIAGNOSIS — M5020 Other cervical disc displacement, unspecified cervical region: Secondary | ICD-10-CM | POA: Diagnosis not present

## 2013-07-15 DIAGNOSIS — M999 Biomechanical lesion, unspecified: Secondary | ICD-10-CM | POA: Diagnosis not present

## 2013-07-15 DIAGNOSIS — M9981 Other biomechanical lesions of cervical region: Secondary | ICD-10-CM | POA: Diagnosis not present

## 2013-07-15 DIAGNOSIS — M5430 Sciatica, unspecified side: Secondary | ICD-10-CM | POA: Diagnosis not present

## 2013-07-18 ENCOUNTER — Other Ambulatory Visit: Payer: Self-pay | Admitting: Adult Health

## 2013-07-18 DIAGNOSIS — J209 Acute bronchitis, unspecified: Secondary | ICD-10-CM | POA: Diagnosis not present

## 2013-07-22 DIAGNOSIS — M5020 Other cervical disc displacement, unspecified cervical region: Secondary | ICD-10-CM | POA: Diagnosis not present

## 2013-07-22 DIAGNOSIS — M9981 Other biomechanical lesions of cervical region: Secondary | ICD-10-CM | POA: Diagnosis not present

## 2013-07-22 DIAGNOSIS — M999 Biomechanical lesion, unspecified: Secondary | ICD-10-CM | POA: Diagnosis not present

## 2013-07-22 DIAGNOSIS — M5430 Sciatica, unspecified side: Secondary | ICD-10-CM | POA: Diagnosis not present

## 2013-08-05 DIAGNOSIS — M5020 Other cervical disc displacement, unspecified cervical region: Secondary | ICD-10-CM | POA: Diagnosis not present

## 2013-08-05 DIAGNOSIS — M999 Biomechanical lesion, unspecified: Secondary | ICD-10-CM | POA: Diagnosis not present

## 2013-08-05 DIAGNOSIS — M5430 Sciatica, unspecified side: Secondary | ICD-10-CM | POA: Diagnosis not present

## 2013-08-05 DIAGNOSIS — M9981 Other biomechanical lesions of cervical region: Secondary | ICD-10-CM | POA: Diagnosis not present

## 2013-08-16 DIAGNOSIS — S139XXA Sprain of joints and ligaments of unspecified parts of neck, initial encounter: Secondary | ICD-10-CM | POA: Diagnosis not present

## 2013-08-16 DIAGNOSIS — S335XXA Sprain of ligaments of lumbar spine, initial encounter: Secondary | ICD-10-CM | POA: Diagnosis not present

## 2013-08-19 DIAGNOSIS — M9981 Other biomechanical lesions of cervical region: Secondary | ICD-10-CM | POA: Diagnosis not present

## 2013-08-19 DIAGNOSIS — M5020 Other cervical disc displacement, unspecified cervical region: Secondary | ICD-10-CM | POA: Diagnosis not present

## 2013-08-19 DIAGNOSIS — M999 Biomechanical lesion, unspecified: Secondary | ICD-10-CM | POA: Diagnosis not present

## 2013-08-19 DIAGNOSIS — M5430 Sciatica, unspecified side: Secondary | ICD-10-CM | POA: Diagnosis not present

## 2013-08-26 DIAGNOSIS — M5020 Other cervical disc displacement, unspecified cervical region: Secondary | ICD-10-CM | POA: Diagnosis not present

## 2013-08-26 DIAGNOSIS — M9981 Other biomechanical lesions of cervical region: Secondary | ICD-10-CM | POA: Diagnosis not present

## 2013-08-26 DIAGNOSIS — M999 Biomechanical lesion, unspecified: Secondary | ICD-10-CM | POA: Diagnosis not present

## 2013-08-26 DIAGNOSIS — M5430 Sciatica, unspecified side: Secondary | ICD-10-CM | POA: Diagnosis not present

## 2013-09-02 DIAGNOSIS — M999 Biomechanical lesion, unspecified: Secondary | ICD-10-CM | POA: Diagnosis not present

## 2013-09-02 DIAGNOSIS — M9981 Other biomechanical lesions of cervical region: Secondary | ICD-10-CM | POA: Diagnosis not present

## 2013-09-02 DIAGNOSIS — M5430 Sciatica, unspecified side: Secondary | ICD-10-CM | POA: Diagnosis not present

## 2013-09-02 DIAGNOSIS — M5020 Other cervical disc displacement, unspecified cervical region: Secondary | ICD-10-CM | POA: Diagnosis not present

## 2013-09-09 DIAGNOSIS — M5020 Other cervical disc displacement, unspecified cervical region: Secondary | ICD-10-CM | POA: Diagnosis not present

## 2013-09-09 DIAGNOSIS — M5430 Sciatica, unspecified side: Secondary | ICD-10-CM | POA: Diagnosis not present

## 2013-09-09 DIAGNOSIS — M999 Biomechanical lesion, unspecified: Secondary | ICD-10-CM | POA: Diagnosis not present

## 2013-09-09 DIAGNOSIS — M9981 Other biomechanical lesions of cervical region: Secondary | ICD-10-CM | POA: Diagnosis not present

## 2013-09-16 DIAGNOSIS — M5430 Sciatica, unspecified side: Secondary | ICD-10-CM | POA: Diagnosis not present

## 2013-09-16 DIAGNOSIS — M999 Biomechanical lesion, unspecified: Secondary | ICD-10-CM | POA: Diagnosis not present

## 2013-09-16 DIAGNOSIS — M5020 Other cervical disc displacement, unspecified cervical region: Secondary | ICD-10-CM | POA: Diagnosis not present

## 2013-09-16 DIAGNOSIS — M9981 Other biomechanical lesions of cervical region: Secondary | ICD-10-CM | POA: Diagnosis not present

## 2013-09-23 DIAGNOSIS — M999 Biomechanical lesion, unspecified: Secondary | ICD-10-CM | POA: Diagnosis not present

## 2013-09-23 DIAGNOSIS — M9981 Other biomechanical lesions of cervical region: Secondary | ICD-10-CM | POA: Diagnosis not present

## 2013-09-23 DIAGNOSIS — M5430 Sciatica, unspecified side: Secondary | ICD-10-CM | POA: Diagnosis not present

## 2013-09-23 DIAGNOSIS — M5020 Other cervical disc displacement, unspecified cervical region: Secondary | ICD-10-CM | POA: Diagnosis not present

## 2013-09-24 ENCOUNTER — Other Ambulatory Visit: Payer: Self-pay | Admitting: Adult Health

## 2013-09-30 DIAGNOSIS — M999 Biomechanical lesion, unspecified: Secondary | ICD-10-CM | POA: Diagnosis not present

## 2013-09-30 DIAGNOSIS — M9981 Other biomechanical lesions of cervical region: Secondary | ICD-10-CM | POA: Diagnosis not present

## 2013-09-30 DIAGNOSIS — M5430 Sciatica, unspecified side: Secondary | ICD-10-CM | POA: Diagnosis not present

## 2013-09-30 DIAGNOSIS — M5020 Other cervical disc displacement, unspecified cervical region: Secondary | ICD-10-CM | POA: Diagnosis not present

## 2013-10-07 DIAGNOSIS — M5020 Other cervical disc displacement, unspecified cervical region: Secondary | ICD-10-CM | POA: Diagnosis not present

## 2013-10-07 DIAGNOSIS — M9981 Other biomechanical lesions of cervical region: Secondary | ICD-10-CM | POA: Diagnosis not present

## 2013-10-07 DIAGNOSIS — M999 Biomechanical lesion, unspecified: Secondary | ICD-10-CM | POA: Diagnosis not present

## 2013-10-07 DIAGNOSIS — M5430 Sciatica, unspecified side: Secondary | ICD-10-CM | POA: Diagnosis not present

## 2013-10-14 DIAGNOSIS — M5430 Sciatica, unspecified side: Secondary | ICD-10-CM | POA: Diagnosis not present

## 2013-10-14 DIAGNOSIS — M999 Biomechanical lesion, unspecified: Secondary | ICD-10-CM | POA: Diagnosis not present

## 2013-10-14 DIAGNOSIS — M5020 Other cervical disc displacement, unspecified cervical region: Secondary | ICD-10-CM | POA: Diagnosis not present

## 2013-10-14 DIAGNOSIS — M9981 Other biomechanical lesions of cervical region: Secondary | ICD-10-CM | POA: Diagnosis not present

## 2013-10-21 DIAGNOSIS — M5020 Other cervical disc displacement, unspecified cervical region: Secondary | ICD-10-CM | POA: Diagnosis not present

## 2013-10-21 DIAGNOSIS — M5430 Sciatica, unspecified side: Secondary | ICD-10-CM | POA: Diagnosis not present

## 2013-10-21 DIAGNOSIS — M999 Biomechanical lesion, unspecified: Secondary | ICD-10-CM | POA: Diagnosis not present

## 2013-10-21 DIAGNOSIS — M9981 Other biomechanical lesions of cervical region: Secondary | ICD-10-CM | POA: Diagnosis not present

## 2013-11-02 ENCOUNTER — Ambulatory Visit (INDEPENDENT_AMBULATORY_CARE_PROVIDER_SITE_OTHER): Payer: Medicare Other

## 2013-11-02 DIAGNOSIS — Z23 Encounter for immunization: Secondary | ICD-10-CM | POA: Diagnosis not present

## 2013-11-04 DIAGNOSIS — M5020 Other cervical disc displacement, unspecified cervical region: Secondary | ICD-10-CM | POA: Diagnosis not present

## 2013-11-04 DIAGNOSIS — M9981 Other biomechanical lesions of cervical region: Secondary | ICD-10-CM | POA: Diagnosis not present

## 2013-11-04 DIAGNOSIS — M5430 Sciatica, unspecified side: Secondary | ICD-10-CM | POA: Diagnosis not present

## 2013-11-04 DIAGNOSIS — M999 Biomechanical lesion, unspecified: Secondary | ICD-10-CM | POA: Diagnosis not present

## 2013-11-11 DIAGNOSIS — M461 Sacroiliitis, not elsewhere classified: Secondary | ICD-10-CM | POA: Diagnosis not present

## 2013-11-11 DIAGNOSIS — M5430 Sciatica, unspecified side: Secondary | ICD-10-CM | POA: Diagnosis not present

## 2013-11-11 DIAGNOSIS — M9902 Segmental and somatic dysfunction of thoracic region: Secondary | ICD-10-CM | POA: Diagnosis not present

## 2013-11-11 DIAGNOSIS — M9903 Segmental and somatic dysfunction of lumbar region: Secondary | ICD-10-CM | POA: Diagnosis not present

## 2013-11-18 DIAGNOSIS — M461 Sacroiliitis, not elsewhere classified: Secondary | ICD-10-CM | POA: Diagnosis not present

## 2013-11-18 DIAGNOSIS — M9905 Segmental and somatic dysfunction of pelvic region: Secondary | ICD-10-CM | POA: Diagnosis not present

## 2013-11-18 DIAGNOSIS — Z72820 Sleep deprivation: Secondary | ICD-10-CM | POA: Diagnosis not present

## 2013-11-18 DIAGNOSIS — M797 Fibromyalgia: Secondary | ICD-10-CM | POA: Diagnosis not present

## 2013-11-18 DIAGNOSIS — M9901 Segmental and somatic dysfunction of cervical region: Secondary | ICD-10-CM | POA: Diagnosis not present

## 2013-11-18 DIAGNOSIS — M5020 Other cervical disc displacement, unspecified cervical region: Secondary | ICD-10-CM | POA: Diagnosis not present

## 2013-11-18 DIAGNOSIS — M9903 Segmental and somatic dysfunction of lumbar region: Secondary | ICD-10-CM | POA: Diagnosis not present

## 2013-11-18 DIAGNOSIS — M5431 Sciatica, right side: Secondary | ICD-10-CM | POA: Diagnosis not present

## 2013-11-19 ENCOUNTER — Encounter: Payer: Self-pay | Admitting: Internal Medicine

## 2013-11-19 ENCOUNTER — Ambulatory Visit (INDEPENDENT_AMBULATORY_CARE_PROVIDER_SITE_OTHER): Payer: Medicare Other | Admitting: Internal Medicine

## 2013-11-19 VITALS — BP 126/74 | HR 75 | Temp 98.0°F | Resp 14 | Ht 70.0 in | Wt 193.8 lb

## 2013-11-19 DIAGNOSIS — E785 Hyperlipidemia, unspecified: Secondary | ICD-10-CM | POA: Insufficient documentation

## 2013-11-19 DIAGNOSIS — N400 Enlarged prostate without lower urinary tract symptoms: Secondary | ICD-10-CM | POA: Diagnosis not present

## 2013-11-19 DIAGNOSIS — R05 Cough: Secondary | ICD-10-CM | POA: Diagnosis not present

## 2013-11-19 DIAGNOSIS — R059 Cough, unspecified: Secondary | ICD-10-CM

## 2013-11-19 DIAGNOSIS — R142 Eructation: Secondary | ICD-10-CM | POA: Insufficient documentation

## 2013-11-19 HISTORY — DX: Benign prostatic hyperplasia without lower urinary tract symptoms: N40.0

## 2013-11-19 MED ORDER — TADALAFIL 10 MG PO TABS: 10.0000 mg | ORAL_TABLET | Freq: Every day | ORAL | Status: DC | PRN

## 2013-11-19 NOTE — Progress Notes (Signed)
Pre visit review using our clinic review tool, if applicable. No additional management support is needed unless otherwise documented below in the visit note. 

## 2013-11-19 NOTE — Progress Notes (Signed)
Subjective:    Patient ID: [name]., male    DOB: 03-05-1955, 58 y.o.   MRN: [id number]  HPI 58YO male presents for acute visit.  Notes increased gas and belching with chronic cough over the last year. Sometimes worse at night. No chest pain. No palpitations. Seen by Dr. Allen Norris. Had upper endoscopy and colonoscopy which were normal last year per his report. Told he was "swallowing a lot of air."  Noted to have hiatal hernia. Started on pantoprazole with no improvement.Symptoms seem to be getting worse. Results in frequent coughing and feeling of choking. Feels short of breath at times. Had stress test in the past which was normal. Denies chest pain, nausea. Notes that he has frequent episodes of bronchitis during the winter months. Former smoker, quit in 1993.  Also has chronic constipation. Takes Miralax and Metamucil.  Totally disabled after injury in 2007. Has bulging discs in lumbar spine and cervical spine.  Urinary hesitancy - Previously used Cialis for BPH. Stopped last year. Notes recent urinary hesitency, decreased stream.  Also used Flomax in the past, but with minimal improvement. Also has long history of intermittent left groin and testicular pain. Has not recently seen his urologist.      Review of Systems  Constitutional: Negative for fever, chills, activity change, appetite change, fatigue and unexpected weight change.  HENT: Negative for sore throat, trouble swallowing and voice change.   Eyes: Negative for visual disturbance.  Respiratory: Positive for cough and shortness of breath.   Cardiovascular: Negative for chest pain, palpitations and leg swelling.  Gastrointestinal: Positive for constipation. Negative for nausea, vomiting, abdominal pain, diarrhea and abdominal distention.  Genitourinary: Positive for decreased urine volume and testicular pain. Negative for dysuria, urgency, frequency, hematuria, flank pain, discharge, scrotal swelling, difficulty  urinating, genital sores and penile pain.  Musculoskeletal: Negative for arthralgias and gait problem.  Skin: Negative for color change and rash.  Hematological: Negative for adenopathy.  Psychiatric/Behavioral: Negative for sleep disturbance and dysphoric mood. The patient is nervous/anxious.        Objective:    BP 126/74  Pulse 75  Temp(Src) 98 F (36.7 C) (Oral)  Resp 14  Ht 5\' 10"  (1.778 m)  Wt 193 lb 12 oz (87.884 kg)  BMI 27.80 kg/m2  SpO2 98% Physical Exam  Constitutional: He is oriented to person, place, and time. He appears well-developed and well-nourished. No distress.  HENT:  Head: Normocephalic and atraumatic.  Right Ear: External ear normal.  Left Ear: External ear normal.  Nose: Nose normal.  Mouth/Throat: Oropharynx is clear and moist. No oropharyngeal exudate.  Eyes: Conjunctivae and EOM are normal. Pupils are equal, round, and reactive to light. Right eye exhibits no discharge. Left eye exhibits no discharge. No scleral icterus.  Neck: Normal range of motion. Neck supple. No tracheal deviation present. No thyromegaly present.  Cardiovascular: Normal rate, regular rhythm and normal heart sounds.  Exam reveals no gallop and no friction rub.   No murmur heard. Pulmonary/Chest: Effort normal and breath sounds normal. No accessory muscle usage. Not tachypneic. No respiratory distress. He has no decreased breath sounds. He has no wheezes. He has no rhonchi. He has no rales. He exhibits no tenderness.  Abdominal: Soft. Bowel sounds are normal. He exhibits no distension. There is no tenderness.  Musculoskeletal: Normal range of motion. He exhibits no edema.  Lymphadenopathy:    He has no cervical adenopathy.  Neurological: He is alert and oriented to person, place, and time.  No cranial nerve deficit. Coordination normal.  Skin: Skin is warm and dry. No rash noted. He is not diaphoretic. No erythema. No pallor.  Psychiatric: His behavior is normal. Judgment and thought  content normal. His mood appears anxious.          Assessment & Plan:   Problem List Items Addressed This Visit     Unprioritized   Belching     One year h/o increased belching. Will request reports on previous upper endoscopy and GI evaluation. Continue pantoprazole. Consider treatment with Baclofen if symptoms persistent and pulmonary evaluation normal.    BPH (benign prostatic hyperplasia)     History of BPH. Previously had some improvement with Cialis. Will restart at 10mg  dose. Will set up evaluation with Urology. Check PSA with labs today. (He prefers to change urologists, as his urologist relocated.)    Relevant Medications      tadalafil (CIALIS) tablet   Other Relevant Orders      PSA, Medicare      Ambulatory referral to Urology   Cough - Primary     Chronic cough with increased reflux symptoms and increased belching.Will request results of previous upper endoscopy. Question if underlying reactive airways may also be contributing to cough symptoms. Will set up evaluation with Dr. Melvyn Novas in Pulmonary. Question if PFTs might be helpful. CXR today.    Relevant Orders      Ambulatory referral to Pulmonology      DG Chest 2 View   Hyperlipidemia     Will check lipids and LFTs with labs. Continue Atorvastatin.    Relevant Medications      tadalafil (CIALIS) tablet   Other Relevant Orders      Comprehensive metabolic panel      Lipid panel       Return in about 4 weeks (around 12/17/2013) for Recheck.

## 2013-11-19 NOTE — Assessment & Plan Note (Signed)
History of BPH. Previously had some improvement with Cialis. Will restart at 10mg  dose. Will set up evaluation with Urology. Check PSA with labs today. (He prefers to change urologists, as his urologist relocated.)

## 2013-11-19 NOTE — Assessment & Plan Note (Addendum)
Chronic cough with increased reflux symptoms and increased belching.Will request results of previous upper endoscopy. Question if underlying reactive airways may also be contributing to cough symptoms. Will set up evaluation with Dr. Melvyn Novas in Pulmonary. Question if PFTs might be helpful. CXR today.

## 2013-11-19 NOTE — Assessment & Plan Note (Signed)
Will check lipids and LFTs with labs. Continue Atorvastatin. 

## 2013-11-19 NOTE — Assessment & Plan Note (Addendum)
One year h/o increased belching. Will request reports on previous upper endoscopy and GI evaluation. Continue pantoprazole. Consider treatment with Baclofen if symptoms persistent and pulmonary evaluation normal.

## 2013-11-19 NOTE — Patient Instructions (Addendum)
We will request notes from Dr. Allen Norris.  Chest xray tomorrow at [facility].  We will set up an evaluation with Dr. Melvyn Novas in pulmonology.  We will also set up an evaluation with urology.  Labs today.  Follow up in 4 weeks.

## 2013-11-19 NOTE — Progress Notes (Deleted)
Pre visit review using our clinic review tool, if applicable. No additional management support is needed unless otherwise documented below in the visit note. 

## 2013-11-20 ENCOUNTER — Telehealth: Payer: Self-pay | Admitting: *Deleted

## 2013-11-20 ENCOUNTER — Ambulatory Visit (INDEPENDENT_AMBULATORY_CARE_PROVIDER_SITE_OTHER)
Admission: RE | Admit: 2013-11-20 | Discharge: 2013-11-20 | Disposition: A | Discharge status: Home / Self Care | Payer: Medicare Other | Source: Ambulatory Visit | Attending: Internal Medicine | Admitting: Internal Medicine

## 2013-11-20 DIAGNOSIS — R05 Cough: Secondary | ICD-10-CM | POA: Diagnosis not present

## 2013-11-20 DIAGNOSIS — R059 Cough, unspecified: Secondary | ICD-10-CM

## 2013-11-20 LAB — COMPREHENSIVE METABOLIC PANEL
ALT: 15 U/L (ref 0–53)
AST: 23 U/L (ref 0–37)
Albumin: 3.6 g/dL (ref 3.5–5.2)
Alkaline Phosphatase: 72 U/L (ref 39–117)
BUN: 19 mg/dL (ref 6–23)
CO2: 27 mEq/L (ref 19–32)
Calcium: 9.8 mg/dL (ref 8.4–10.5)
Chloride: 106 mEq/L (ref 96–112)
Creatinine, Ser: 1 mg/dL (ref 0.4–1.5)
GFR: 79.85 mL/min (ref >60.00)
Glucose, Bld: 72 mg/dL (ref 70–99)
Potassium: 4.1 mEq/L (ref 3.5–5.1)
Sodium: 140 mEq/L (ref 135–145)
Total Bilirubin: 1.1 mg/dL (ref 0.2–1.2)
Total Protein: 6.9 g/dL (ref 6.0–8.3)

## 2013-11-20 LAB — LIPID PANEL
Cholesterol: 171 mg/dL (ref 0–200)
HDL: 43.2 mg/dL (ref >39.00)
LDL Cholesterol: 101 mg/dL — ABNORMAL HIGH (ref 0–99)
NonHDL: 127.8
Total CHOL/HDL Ratio: 4
Triglycerides: 133 mg/dL (ref 0.0–149.0)
VLDL: 26.6 mg/dL (ref 0.0–40.0)

## 2013-11-20 LAB — PSA, MEDICARE: PSA: 3.49 ng/ml (ref 0.10–4.00)

## 2013-11-20 NOTE — Telephone Encounter (Signed)
Fax from [facility], needing PA on Cialis. Started online, pending response

## 2013-11-21 NOTE — Telephone Encounter (Signed)
Another PA form completed, placed in [facility] box for signature and fax

## 2013-11-22 ENCOUNTER — Ambulatory Visit (INDEPENDENT_AMBULATORY_CARE_PROVIDER_SITE_OTHER): Payer: Medicare Other | Admitting: Internal Medicine

## 2013-11-22 ENCOUNTER — Encounter: Payer: Self-pay | Admitting: Internal Medicine

## 2013-11-22 VITALS — BP 118/76 | HR 85 | Temp 98.0°F | Ht 70.0 in | Wt 197.0 lb

## 2013-11-22 DIAGNOSIS — R05 Cough: Secondary | ICD-10-CM

## 2013-11-22 DIAGNOSIS — R059 Cough, unspecified: Secondary | ICD-10-CM

## 2013-11-22 MED ORDER — PREDNISONE 10 MG PO TABS: ORAL_TABLET | ORAL | Status: DC

## 2013-11-22 NOTE — Progress Notes (Signed)
Subjective:     Patient ID: Tyler Loop., male   DOB: October 19, 1955  MRN: [id number]  HPI  60 yowm with urge to clear throat starting in the 1990s stopped smoked in 1993 never stopped the throat clearing eval by Dr Verl Blal[facility] [place] for new belching with chest tightness around fall 2014 dx as aerophagia but referred to pulmonary clinic  11/22/2013 by Dr Ronette Deter.   11/22/2013 1st [facility] office visit/ [facility]   Chief Complaint  Patient presents with  . Advice Only    Referred by: Dr. Gilford Rile; constantly coughing, clearing throat, belching all the time, sinus drainage, reflux   every day x 30 y urge to clear the throat > scant mucus not aware of it while sleeping / worse p eating taking protonix between bfast and supper.  ent eval x bennett in [place] "neg" per pt.  No obvious day to day or daytime variabilty or assoc sob  or cp or chest tightness, subjective wheeze overt sinus or hb symptoms. No unusual exp hx or h/o childhood pna/ asthma or knowledge of premature birth.  Sleeping ok without nocturnal  or early am exacerbation  of respiratory  c/o's or need for noct saba. Also denies any obvious fluctuation of symptoms with weather or environmental changes or other aggravating or alleviating factors except as outlined above   Current Medications, Allergies, Complete Past Medical History, Past Surgical History, Family History, and Social History were reviewed in [employer] record.  ROS  The following are not active complaints unless bolded sore throat, dysphagia, dental problems, itching, sneezing,  nasal congestion or excess/ purulent secretions, ear ache,   fever, chills, sweats, unintended wt loss, pleuritic or exertional cp, hemoptysis,  orthopnea pnd or leg swelling, presyncope, palpitations, heartburn, abdominal pain, anorexia, nausea, vomiting, diarrhea  or change in bowel or urinary habits, change in stools or urine, dysuria,hematuria,  rash,  arthralgias, visual complaints, headache, numbness weakness or ataxia or problems with walking or coordination,  change in mood/affect or memory.        Review of Systems     Objective:   Physical Exam  amb wm nad with freq throat clearing   Wt Readings from Last 3 Encounters:  11/22/13 197 lb (89.359 kg)  11/19/13 193 lb 12 oz (87.884 kg)  06/06/13 192 lb 8 oz (87.317 kg)      HEENT: nl dentition, turbinates, and orophanx. Nl external ear canals without cough reflex   NECK :  without JVD/Nodes/TM/ nl carotid upstrokes bilaterally   LUNGS: no acc muscle use, clear to A and P bilaterally without cough on insp or exp maneuvers   CV:  RRR  no s3 or murmur or increase in P2, no edema   ABD:  soft and nontender with nl excursion in the supine position. No bruits or organomegaly, bowel sounds nl  MS:  warm without deformities, calf tenderness, cyanosis or clubbing  SKIN: warm and dry without lesions    NEURO:  alert, approp, no deficits    11/20/13 cxr No active cardiopulmonary disease.     Assessment:

## 2013-11-22 NOTE — Patient Instructions (Signed)
Prednisone 10 mg take  4 each am x 2 days,   2 each am x 2 days,  1 each am x 2 days and stop   Pantoprazole (protonix) 40 mg   Take 30-60 min before first meal of the day  For drainage take chlortrimeton (chlorpheniramine) 4 mg every 4 hours available over the counter (may cause drowsiness)   GERD (REFLUX)  is an extremely common cause of respiratory symptoms, many times with no significant heartburn at all.    It can be treated with medication, but also with lifestyle changes including avoidance of late meals, excessive alcohol, smoking cessation, and avoid fatty foods, chocolate, peppermint, colas, red wine, and acidic juices such as orange juice.  NO MINT OR MENTHOL PRODUCTS SO NO COUGH DROPS  USE SUGARLESS CANDY INSTEAD (jolley ranchers or Stover's or lifesaver   NO OIL BASED VITAMINS - use powdered substitutes.  If this is not effective the next step is to see your ENT doctor or be referred to Dr Joya Gaskins ENT at [facility] for consideration for neurontin trial

## 2013-11-24 NOTE — Assessment & Plan Note (Addendum)
Agree with Dr Gilford Rile this probably does not represent cough variant asthma but much more likley by hx:   Classic Upper airway cough syndrome, so named because it's frequently impossible to sort out how much is  CR/sinusitis with freq throat clearing (which can be related to primary GERD)   vs  causing  secondary (" extra esophageal")  GERD from wide swings in gastric pressure that occur with throat clearing, often  promoting self use of mint and menthol lozenges that reduce the lower esophageal sphincter tone and exacerbate the problem further in a cyclical fashion.   These are the same pts (now being labeled as having "irritable larynx syndrome" by some cough centers) who not infrequently have a history of having failed to tolerate ace inhibitors,  dry powder inhalers or biphosphonates or report having atypical reflux symptoms that don't respond to standard doses of PPI , and are easily confused as having aecopd or asthma flares by even experienced allergists/ pulmonologists.   For now rec max rx for GERD/ 6 days of pred for any inflammatory component to the constant throat clearing,  And f/u with ENT locally before considering referral to [facility] (could consider neurontin trial first at 100 mg tid if willing to commit to at least a month's rx)  See instructions for specific recommendations which were reviewed directly with the patient who was given a copy with highlighter outlining the key components.

## 2013-11-25 NOTE — Telephone Encounter (Signed)
PA approved through 11/24/14. Pharmacy notified.

## 2013-12-02 DIAGNOSIS — M461 Sacroiliitis, not elsewhere classified: Secondary | ICD-10-CM | POA: Diagnosis not present

## 2013-12-02 DIAGNOSIS — Z72820 Sleep deprivation: Secondary | ICD-10-CM | POA: Diagnosis not present

## 2013-12-02 DIAGNOSIS — M9903 Segmental and somatic dysfunction of lumbar region: Secondary | ICD-10-CM | POA: Diagnosis not present

## 2013-12-02 DIAGNOSIS — M9905 Segmental and somatic dysfunction of pelvic region: Secondary | ICD-10-CM | POA: Diagnosis not present

## 2013-12-02 DIAGNOSIS — M5431 Sciatica, right side: Secondary | ICD-10-CM | POA: Diagnosis not present

## 2013-12-02 DIAGNOSIS — M5020 Other cervical disc displacement, unspecified cervical region: Secondary | ICD-10-CM | POA: Diagnosis not present

## 2013-12-02 DIAGNOSIS — M9901 Segmental and somatic dysfunction of cervical region: Secondary | ICD-10-CM | POA: Diagnosis not present

## 2013-12-02 DIAGNOSIS — M797 Fibromyalgia: Secondary | ICD-10-CM | POA: Diagnosis not present

## 2013-12-09 DIAGNOSIS — M5020 Other cervical disc displacement, unspecified cervical region: Secondary | ICD-10-CM | POA: Diagnosis not present

## 2013-12-09 DIAGNOSIS — Z72820 Sleep deprivation: Secondary | ICD-10-CM | POA: Diagnosis not present

## 2013-12-09 DIAGNOSIS — M9901 Segmental and somatic dysfunction of cervical region: Secondary | ICD-10-CM | POA: Diagnosis not present

## 2013-12-09 DIAGNOSIS — M5431 Sciatica, right side: Secondary | ICD-10-CM | POA: Diagnosis not present

## 2013-12-09 DIAGNOSIS — M9903 Segmental and somatic dysfunction of lumbar region: Secondary | ICD-10-CM | POA: Diagnosis not present

## 2013-12-09 DIAGNOSIS — M461 Sacroiliitis, not elsewhere classified: Secondary | ICD-10-CM | POA: Diagnosis not present

## 2013-12-09 DIAGNOSIS — M797 Fibromyalgia: Secondary | ICD-10-CM | POA: Diagnosis not present

## 2013-12-09 DIAGNOSIS — M9905 Segmental and somatic dysfunction of pelvic region: Secondary | ICD-10-CM | POA: Diagnosis not present

## 2013-12-12 ENCOUNTER — Other Ambulatory Visit: Payer: Self-pay | Admitting: *Deleted

## 2013-12-12 MED ORDER — ZOLPIDEM TARTRATE 5 MG PO TABS: 5.0000 mg | ORAL_TABLET | Freq: Every evening | ORAL | Status: DC | PRN

## 2013-12-12 NOTE — Telephone Encounter (Signed)
Called to pharmacy 

## 2013-12-12 NOTE — Telephone Encounter (Signed)
Ok refill? 

## 2013-12-16 DIAGNOSIS — M9905 Segmental and somatic dysfunction of pelvic region: Secondary | ICD-10-CM | POA: Diagnosis not present

## 2013-12-16 DIAGNOSIS — M5431 Sciatica, right side: Secondary | ICD-10-CM | POA: Diagnosis not present

## 2013-12-16 DIAGNOSIS — M461 Sacroiliitis, not elsewhere classified: Secondary | ICD-10-CM | POA: Diagnosis not present

## 2013-12-16 DIAGNOSIS — M9903 Segmental and somatic dysfunction of lumbar region: Secondary | ICD-10-CM | POA: Diagnosis not present

## 2013-12-16 DIAGNOSIS — Z72820 Sleep deprivation: Secondary | ICD-10-CM | POA: Diagnosis not present

## 2013-12-16 DIAGNOSIS — M9901 Segmental and somatic dysfunction of cervical region: Secondary | ICD-10-CM | POA: Diagnosis not present

## 2013-12-16 DIAGNOSIS — M797 Fibromyalgia: Secondary | ICD-10-CM | POA: Diagnosis not present

## 2013-12-16 DIAGNOSIS — M5020 Other cervical disc displacement, unspecified cervical region: Secondary | ICD-10-CM | POA: Diagnosis not present

## 2013-12-18 DIAGNOSIS — N508 Other specified disorders of male genital organs: Secondary | ICD-10-CM | POA: Diagnosis not present

## 2013-12-18 DIAGNOSIS — N401 Enlarged prostate with lower urinary tract symptoms: Secondary | ICD-10-CM | POA: Diagnosis not present

## 2013-12-23 ENCOUNTER — Ambulatory Visit: Payer: Self-pay

## 2013-12-23 DIAGNOSIS — N503 Cyst of epididymis: Secondary | ICD-10-CM | POA: Diagnosis not present

## 2013-12-23 DIAGNOSIS — N508 Other specified disorders of male genital organs: Secondary | ICD-10-CM | POA: Diagnosis not present

## 2013-12-23 DIAGNOSIS — N401 Enlarged prostate with lower urinary tract symptoms: Secondary | ICD-10-CM | POA: Diagnosis not present

## 2013-12-26 ENCOUNTER — Encounter: Payer: Self-pay | Admitting: Internal Medicine

## 2013-12-26 ENCOUNTER — Ambulatory Visit (INDEPENDENT_AMBULATORY_CARE_PROVIDER_SITE_OTHER): Payer: Medicare Other | Admitting: Internal Medicine

## 2013-12-26 VITALS — BP 132/76 | HR 55 | Temp 97.9°F | Ht 70.0 in | Wt 195.5 lb

## 2013-12-26 DIAGNOSIS — R05 Cough: Secondary | ICD-10-CM | POA: Diagnosis not present

## 2013-12-26 DIAGNOSIS — F411 Generalized anxiety disorder: Secondary | ICD-10-CM | POA: Diagnosis not present

## 2013-12-26 DIAGNOSIS — N400 Enlarged prostate without lower urinary tract symptoms: Secondary | ICD-10-CM | POA: Diagnosis not present

## 2013-12-26 DIAGNOSIS — M722 Plantar fascial fibromatosis: Secondary | ICD-10-CM | POA: Diagnosis not present

## 2013-12-26 DIAGNOSIS — R059 Cough, unspecified: Secondary | ICD-10-CM

## 2013-12-26 MED ORDER — ESCITALOPRAM OXALATE 5 MG PO TABS: 5.0000 mg | ORAL_TABLET | Freq: Every day | ORAL | Status: DC

## 2013-12-26 MED ORDER — MELOXICAM 15 MG PO TABS: 15.0000 mg | ORAL_TABLET | Freq: Every day | ORAL | Status: DC

## 2013-12-26 NOTE — Progress Notes (Signed)
Subjective:    Patient ID: [name]., male    DOB: February 03, 1956, 58 y.o.   MRN: [id number]  HPI 58YO male presents for followup.  Cough - Mild improvement with changing timing of Pantoprazole. Continues to have some cough and shortness of breath at night. Feels tight in upper chest. Had ENT evaluation which was normal and upper endoscopy which were normal.  Urinary hesitency - Started on Cialis. Had some improvement in urinary flow. Seen by Urology. Started on Rapaflow. Unsure if any improvement yet.  Left heel pain- about one month. When stands in the morning, has severe stabbing pain in left heel pain. Tries to wear supportive shoes. Not taking any meds for this.  Anxiety - Severe generalized anxiety noted by pt for his entire life. Having some marital issues. Does not want to purse counseling. Has never taken medication for this.  Review of Systems  Constitutional: Negative for fever, chills, activity change, appetite change, fatigue and unexpected weight change.  Eyes: Negative for visual disturbance.  Respiratory: Negative for cough and shortness of breath.   Cardiovascular: Negative for chest pain, palpitations and leg swelling.  Gastrointestinal: Negative for nausea, vomiting, abdominal pain, diarrhea, constipation and abdominal distention.  Genitourinary: Negative for dysuria, urgency, frequency and difficulty urinating.  Musculoskeletal: Positive for myalgias and arthralgias. Negative for gait problem.  Skin: Negative for color change and rash.  Hematological: Negative for adenopathy.  Psychiatric/Behavioral: Positive for sleep disturbance and dysphoric mood. Negative for suicidal ideas. The patient is nervous/anxious.        Objective:    BP 132/76 mmHg  Pulse 55  Temp(Src) 97.9 F (36.6 C) (Oral)  Ht 5\' 10"  (1.778 m)  Wt 195 lb 8 oz (88.678 kg)  BMI 28.05 kg/m2  SpO2 100% Physical Exam  Constitutional: He is oriented to person, place, and time. He appears  well-developed and well-nourished. No distress.  HENT:  Head: Normocephalic and atraumatic.  Right Ear: External ear normal.  Left Ear: External ear normal.  Nose: Nose normal.  Mouth/Throat: Oropharynx is clear and moist. No oropharyngeal exudate.  Eyes: Conjunctivae and EOM are normal. Pupils are equal, round, and reactive to light. Right eye exhibits no discharge. Left eye exhibits no discharge. No scleral icterus.  Neck: Normal range of motion. Neck supple. No tracheal deviation present. No thyromegaly present.  Cardiovascular: Normal rate, regular rhythm and normal heart sounds.  Exam reveals no gallop and no friction rub.   No murmur heard. Pulmonary/Chest: Effort normal and breath sounds normal. No accessory muscle usage. No tachypnea. No respiratory distress. He has no decreased breath sounds. He has no wheezes. He has no rhonchi. He has no rales. He exhibits no tenderness.  Musculoskeletal: Normal range of motion. He exhibits no edema.       Left foot: There is tenderness. There is no swelling and no deformity.       Feet:  Lymphadenopathy:    He has no cervical adenopathy.  Neurological: He is alert and oriented to person, place, and time. No cranial nerve deficit. Coordination normal.  Skin: Skin is warm and dry. No rash noted. He is not diaphoretic. No erythema. No pallor.  Psychiatric: He has a normal mood and affect. His behavior is normal. Judgment and thought content normal.          Assessment & Plan:   Problem List Items Addressed This Visit      Unprioritized   BPH (benign prostatic hyperplasia)    Symptoms improved  with Cialis and Rapaflow. Will continue.    Relevant Medications      RAPAFLO 8 MG CAPS capsule   Cough - Primary    Symptoms improved with changing timing of pantoprazole. Suspect anxiety also playing a role. Will continue to monitor.    Generalized anxiety disorder    Symptoms consistent with GAD. Will start Lexapro 5mg  daily. Follow up by  email in 1 week and in visit in 4 weeks.    Relevant Medications      escitalopram (LEXAPRO) tablet   Plantar fasciitis of left foot    Symptoms and exam consistent with plantar fasciitis. Will start daily meloxicam. Encouraged him to use supportive footwear and ice heel daily. Follow up in 4 weeks.    Relevant Medications      meloxicam (MOBIC) tablet       Return in about 4 weeks (around 01/23/2014) for Recheck.

## 2013-12-26 NOTE — Assessment & Plan Note (Signed)
Symptoms improved with Cialis and Rapaflow. Will continue.

## 2013-12-26 NOTE — Assessment & Plan Note (Signed)
Symptoms and exam consistent with plantar fasciitis. Will start daily meloxicam. Encouraged him to use supportive footwear and ice heel daily. Follow up in 4 weeks.

## 2013-12-26 NOTE — Assessment & Plan Note (Signed)
Symptoms consistent with GAD. Will start Lexapro 5mg  daily. Follow up by email in 1 week and in visit in 4 weeks.

## 2013-12-26 NOTE — Patient Instructions (Addendum)
Start Lexapro 5mg  daily to help with anxiety. Start Meloxicam 15mg  daily to help treat heel pain.  Plantar Fasciitis Plantar fasciitis is a common condition that causes foot pain. It is soreness (inflammation) of the band of tough fibrous tissue on the bottom of the foot that runs from the heel bone (calcaneus) to the ball of the foot. The cause of this soreness may be from excessive standing, poor fitting shoes, running on hard surfaces, being overweight, having an abnormal walk, or overuse (this is common in runners) of the painful foot or feet. It is also common in aerobic exercise dancers and ballet dancers. SYMPTOMS  Most people with plantar fasciitis complain of:  Severe pain in the morning on the bottom of their foot especially when taking the first steps out of bed. This pain recedes after a few minutes of walking.  Severe pain is experienced also during walking following a long period of inactivity.  Pain is worse when walking barefoot or up stairs DIAGNOSIS   Your caregiver will diagnose this condition by examining and feeling your foot.  Special tests such as X-rays of your foot, are usually not needed. PREVENTION   Consult a sports medicine professional before beginning a new exercise program.  Walking programs offer a good workout. With walking there is a lower chance of overuse injuries common to runners. There is less impact and less jarring of the joints.  Begin all new exercise programs slowly. If problems or pain develop, decrease the amount of time or distance until you are at a comfortable level.  Wear good shoes and replace them regularly.  Stretch your foot and the heel cords at the back of the ankle (Achilles tendon) both before and after exercise.  Run or exercise on even surfaces that are not hard. For example, asphalt is better than pavement.  Do not run barefoot on hard surfaces.  If using a treadmill, vary the incline.  Do not continue to workout if you  have foot or joint problems. Seek professional help if they do not improve. HOME CARE INSTRUCTIONS   Avoid activities that cause you pain until you recover.  Use ice or cold packs on the problem or painful areas after working out.  Only take over-the-counter or prescription medicines for pain, discomfort, or fever as directed by your caregiver.  Soft shoe inserts or athletic shoes with air or gel sole cushions may be helpful.  If problems continue or become more severe, consult a sports medicine caregiver or your own health care provider. Cortisone is a potent anti-inflammatory medication that may be injected into the painful area. You can discuss this treatment with your caregiver. MAKE SURE YOU:   Understand these instructions.  Will watch your condition.  Will get help right away if you are not doing well or get worse. Document Released: 10/19/2000 Document Revised: 04/18/2011 Document Reviewed: 12/19/2007 [facility] Patient Information 2015 [place], [place]. This information is not intended to replace advice given to you by your health care provider. Make sure you discuss any questions you have with your health care provider.

## 2013-12-26 NOTE — Assessment & Plan Note (Signed)
Symptoms improved with changing timing of pantoprazole. Suspect anxiety also playing a role. Will continue to monitor.

## 2013-12-26 NOTE — Progress Notes (Signed)
Pre visit review using our clinic review tool, if applicable. No additional management support is needed unless otherwise documented below in the visit note. 

## 2013-12-30 DIAGNOSIS — M461 Sacroiliitis, not elsewhere classified: Secondary | ICD-10-CM | POA: Diagnosis not present

## 2013-12-30 DIAGNOSIS — M9901 Segmental and somatic dysfunction of cervical region: Secondary | ICD-10-CM | POA: Diagnosis not present

## 2013-12-30 DIAGNOSIS — M5020 Other cervical disc displacement, unspecified cervical region: Secondary | ICD-10-CM | POA: Diagnosis not present

## 2013-12-30 DIAGNOSIS — M9905 Segmental and somatic dysfunction of pelvic region: Secondary | ICD-10-CM | POA: Diagnosis not present

## 2013-12-30 DIAGNOSIS — Z72820 Sleep deprivation: Secondary | ICD-10-CM | POA: Diagnosis not present

## 2013-12-30 DIAGNOSIS — M797 Fibromyalgia: Secondary | ICD-10-CM | POA: Diagnosis not present

## 2013-12-30 DIAGNOSIS — M5431 Sciatica, right side: Secondary | ICD-10-CM | POA: Diagnosis not present

## 2013-12-30 DIAGNOSIS — M9903 Segmental and somatic dysfunction of lumbar region: Secondary | ICD-10-CM | POA: Diagnosis not present

## 2014-01-05 DIAGNOSIS — J019 Acute sinusitis, unspecified: Secondary | ICD-10-CM | POA: Diagnosis not present

## 2014-01-06 DIAGNOSIS — M5020 Other cervical disc displacement, unspecified cervical region: Secondary | ICD-10-CM | POA: Diagnosis not present

## 2014-01-06 DIAGNOSIS — M9901 Segmental and somatic dysfunction of cervical region: Secondary | ICD-10-CM | POA: Diagnosis not present

## 2014-01-06 DIAGNOSIS — M9903 Segmental and somatic dysfunction of lumbar region: Secondary | ICD-10-CM | POA: Diagnosis not present

## 2014-01-06 DIAGNOSIS — M9905 Segmental and somatic dysfunction of pelvic region: Secondary | ICD-10-CM | POA: Diagnosis not present

## 2014-01-06 DIAGNOSIS — M797 Fibromyalgia: Secondary | ICD-10-CM | POA: Diagnosis not present

## 2014-01-06 DIAGNOSIS — Z72820 Sleep deprivation: Secondary | ICD-10-CM | POA: Diagnosis not present

## 2014-01-06 DIAGNOSIS — M5431 Sciatica, right side: Secondary | ICD-10-CM | POA: Diagnosis not present

## 2014-01-06 DIAGNOSIS — M461 Sacroiliitis, not elsewhere classified: Secondary | ICD-10-CM | POA: Diagnosis not present

## 2014-01-10 DIAGNOSIS — M542 Cervicalgia: Secondary | ICD-10-CM | POA: Diagnosis not present

## 2014-01-10 DIAGNOSIS — M545 Low back pain: Secondary | ICD-10-CM | POA: Diagnosis not present

## 2014-01-13 DIAGNOSIS — M9903 Segmental and somatic dysfunction of lumbar region: Secondary | ICD-10-CM | POA: Diagnosis not present

## 2014-01-13 DIAGNOSIS — M9905 Segmental and somatic dysfunction of pelvic region: Secondary | ICD-10-CM | POA: Diagnosis not present

## 2014-01-13 DIAGNOSIS — M9901 Segmental and somatic dysfunction of cervical region: Secondary | ICD-10-CM | POA: Diagnosis not present

## 2014-01-13 DIAGNOSIS — M797 Fibromyalgia: Secondary | ICD-10-CM | POA: Diagnosis not present

## 2014-01-13 DIAGNOSIS — M5020 Other cervical disc displacement, unspecified cervical region: Secondary | ICD-10-CM | POA: Diagnosis not present

## 2014-01-13 DIAGNOSIS — M461 Sacroiliitis, not elsewhere classified: Secondary | ICD-10-CM | POA: Diagnosis not present

## 2014-01-13 DIAGNOSIS — Z72820 Sleep deprivation: Secondary | ICD-10-CM | POA: Diagnosis not present

## 2014-01-13 DIAGNOSIS — M5431 Sciatica, right side: Secondary | ICD-10-CM | POA: Diagnosis not present

## 2014-01-22 DIAGNOSIS — N401 Enlarged prostate with lower urinary tract symptoms: Secondary | ICD-10-CM | POA: Diagnosis not present

## 2014-01-27 DIAGNOSIS — M5431 Sciatica, right side: Secondary | ICD-10-CM | POA: Diagnosis not present

## 2014-01-27 DIAGNOSIS — Z72820 Sleep deprivation: Secondary | ICD-10-CM | POA: Diagnosis not present

## 2014-01-27 DIAGNOSIS — M9905 Segmental and somatic dysfunction of pelvic region: Secondary | ICD-10-CM | POA: Diagnosis not present

## 2014-01-27 DIAGNOSIS — M9903 Segmental and somatic dysfunction of lumbar region: Secondary | ICD-10-CM | POA: Diagnosis not present

## 2014-01-27 DIAGNOSIS — M797 Fibromyalgia: Secondary | ICD-10-CM | POA: Diagnosis not present

## 2014-01-27 DIAGNOSIS — M9901 Segmental and somatic dysfunction of cervical region: Secondary | ICD-10-CM | POA: Diagnosis not present

## 2014-01-27 DIAGNOSIS — M5020 Other cervical disc displacement, unspecified cervical region: Secondary | ICD-10-CM | POA: Diagnosis not present

## 2014-01-27 DIAGNOSIS — M461 Sacroiliitis, not elsewhere classified: Secondary | ICD-10-CM | POA: Diagnosis not present

## 2014-02-03 DIAGNOSIS — M461 Sacroiliitis, not elsewhere classified: Secondary | ICD-10-CM | POA: Diagnosis not present

## 2014-02-03 DIAGNOSIS — M797 Fibromyalgia: Secondary | ICD-10-CM | POA: Diagnosis not present

## 2014-02-03 DIAGNOSIS — M5431 Sciatica, right side: Secondary | ICD-10-CM | POA: Diagnosis not present

## 2014-02-03 DIAGNOSIS — M5020 Other cervical disc displacement, unspecified cervical region: Secondary | ICD-10-CM | POA: Diagnosis not present

## 2014-02-03 DIAGNOSIS — M9903 Segmental and somatic dysfunction of lumbar region: Secondary | ICD-10-CM | POA: Diagnosis not present

## 2014-02-03 DIAGNOSIS — M9901 Segmental and somatic dysfunction of cervical region: Secondary | ICD-10-CM | POA: Diagnosis not present

## 2014-02-03 DIAGNOSIS — Z72820 Sleep deprivation: Secondary | ICD-10-CM | POA: Diagnosis not present

## 2014-02-03 DIAGNOSIS — M9905 Segmental and somatic dysfunction of pelvic region: Secondary | ICD-10-CM | POA: Diagnosis not present

## 2014-02-11 ENCOUNTER — Other Ambulatory Visit: Payer: Self-pay | Admitting: *Deleted

## 2014-02-11 MED ORDER — CYCLOBENZAPRINE HCL 10 MG PO TABS: ORAL_TABLET | ORAL | Status: DC

## 2014-02-24 ENCOUNTER — Telehealth: Payer: Self-pay | Admitting: Internal Medicine

## 2014-02-24 DIAGNOSIS — M722 Plantar fascial fibromatosis: Secondary | ICD-10-CM

## 2014-02-24 NOTE — Telephone Encounter (Signed)
Please see phone note below.

## 2014-02-24 NOTE — Telephone Encounter (Signed)
Pt stopped by the office to state that he is still having heel pain and was wanted to know if he needs a refill on rx that he was given. Pt also wanted to know what shots that he will need to get from going out of the country . Pt is going to [place]. msn

## 2014-02-25 MED ORDER — MELOXICAM 15 MG PO TABS: 15.0000 mg | ORAL_TABLET | Freq: Every day | ORAL | Status: DC

## 2014-02-25 NOTE — Telephone Encounter (Signed)
Pt has already talked with the travel clinic, no referral needed.  Sending Rx to pharmacy.

## 2014-02-25 NOTE — Telephone Encounter (Signed)
Does he want a referral to the travel clinic at [facility]? I would recommend this. Fine to refill meds. I assume Meloxicam?

## 2014-03-05 ENCOUNTER — Other Ambulatory Visit: Payer: Self-pay

## 2014-03-05 MED ORDER — CYCLOBENZAPRINE HCL 10 MG PO TABS: ORAL_TABLET | ORAL | Status: DC

## 2014-03-10 ENCOUNTER — Telehealth: Payer: Self-pay | Admitting: *Deleted

## 2014-03-10 ENCOUNTER — Other Ambulatory Visit: Payer: Self-pay | Admitting: *Deleted

## 2014-03-10 DIAGNOSIS — M9901 Segmental and somatic dysfunction of cervical region: Secondary | ICD-10-CM | POA: Diagnosis not present

## 2014-03-10 DIAGNOSIS — M461 Sacroiliitis, not elsewhere classified: Secondary | ICD-10-CM | POA: Diagnosis not present

## 2014-03-10 DIAGNOSIS — M6240 Contracture of muscle, unspecified site: Secondary | ICD-10-CM | POA: Diagnosis not present

## 2014-03-10 DIAGNOSIS — M5431 Sciatica, right side: Secondary | ICD-10-CM | POA: Diagnosis not present

## 2014-03-10 DIAGNOSIS — M5020 Other cervical disc displacement, unspecified cervical region: Secondary | ICD-10-CM | POA: Diagnosis not present

## 2014-03-10 DIAGNOSIS — M9902 Segmental and somatic dysfunction of thoracic region: Secondary | ICD-10-CM | POA: Diagnosis not present

## 2014-03-10 DIAGNOSIS — M9903 Segmental and somatic dysfunction of lumbar region: Secondary | ICD-10-CM | POA: Diagnosis not present

## 2014-03-10 DIAGNOSIS — M9905 Segmental and somatic dysfunction of pelvic region: Secondary | ICD-10-CM | POA: Diagnosis not present

## 2014-03-10 DIAGNOSIS — M797 Fibromyalgia: Secondary | ICD-10-CM | POA: Diagnosis not present

## 2014-03-10 DIAGNOSIS — Z72820 Sleep deprivation: Secondary | ICD-10-CM | POA: Diagnosis not present

## 2014-03-10 MED ORDER — ESOMEPRAZOLE MAGNESIUM 20 MG PO CPDR: 20.0000 mg | DELAYED_RELEASE_CAPSULE | Freq: Every day | ORAL | Status: DC

## 2014-03-10 NOTE — Telephone Encounter (Signed)
Refill sent.

## 2014-03-10 NOTE — Telephone Encounter (Signed)
Fine to call in Esomeprazole 20mg  daily #90 with 3 refills, however this medication may not be as effective as the Pantoprazole 40mg  daily dose.

## 2014-03-10 NOTE — Telephone Encounter (Signed)
Pt called states he wants his acid reflux medication to be changed from Pantoprazole 40 mg to Esomeprazole 20 mg as his wife.  He states he wants to take the same medication as she.  requests Rx be sent to [facility] [place].  Last OV 11.19.15. Please advise

## 2014-03-11 ENCOUNTER — Telehealth: Payer: Self-pay | Admitting: Internal Medicine

## 2014-03-11 MED ORDER — PANTOPRAZOLE SODIUM 40 MG PO TBEC: 40.0000 mg | DELAYED_RELEASE_TABLET | Freq: Every day | ORAL | Status: DC

## 2014-03-11 NOTE — Telephone Encounter (Signed)
Rx sent to pharmacy by escript. Pt notified  

## 2014-03-11 NOTE — Telephone Encounter (Signed)
Pt received messgae from [facility] stating that the Esomeprazole rx is not covered. Pt requested to switch back to [place] (both him and his wife [name]). Please advise pt/msn

## 2014-03-24 ENCOUNTER — Other Ambulatory Visit: Payer: Self-pay | Admitting: *Deleted

## 2014-03-24 MED ORDER — ATORVASTATIN CALCIUM 10 MG PO TABS: 10.0000 mg | ORAL_TABLET | Freq: Every day | ORAL | Status: DC

## 2014-03-25 ENCOUNTER — Telehealth: Payer: Self-pay | Admitting: Internal Medicine

## 2014-03-25 ENCOUNTER — Other Ambulatory Visit: Payer: Self-pay | Admitting: Internal Medicine

## 2014-03-25 NOTE — Telephone Encounter (Signed)
[facility] Patient Name: [name] DOB: 02/11/1955 Initial Comment Caller states he will flying out of country soon, motion sickness pills say to ask doctor before taking since he has enlarged prostate and takes cialis Nurse Assessment Nurse: Ronnald Ramp, RN, Miranda Date/Time (Eastern Time): 03/25/2014 12:20:22 PM Confirm and document reason for call. If symptomatic, describe symptoms. ---Caller states he has questions about the safety of taking dimenhydrinate. There is a warning on the label that says to discuss with doctor before taking if history or prostate enlargement which he has. She is going to be flying and may need to take for motion sickness. Has the patient traveled out of the country within the last 30 days? ---Not Applicable Does the patient require triage? ---No Please document clinical information provided and list any resource used. ---Told caller that I would send a message to a doctor to contact him for approval. Guidelines Guideline Title Affirmed Question Affirmed Notes Final Disposition User Clinical Call Ronnald Ramp, RN, Jeannetta Nap

## 2014-03-25 NOTE — Telephone Encounter (Signed)
Spoke with pt, advised of MDs message 

## 2014-03-25 NOTE — Telephone Encounter (Signed)
It would be fine for him to take medication for motion sickness. These medications may cause decreased urine output and dry mouth.

## 2014-03-26 NOTE — Telephone Encounter (Signed)
Last OV 11.19.15.  Please advise refill

## 2014-03-31 DIAGNOSIS — Z72820 Sleep deprivation: Secondary | ICD-10-CM | POA: Diagnosis not present

## 2014-03-31 DIAGNOSIS — M5431 Sciatica, right side: Secondary | ICD-10-CM | POA: Diagnosis not present

## 2014-03-31 DIAGNOSIS — M9903 Segmental and somatic dysfunction of lumbar region: Secondary | ICD-10-CM | POA: Diagnosis not present

## 2014-03-31 DIAGNOSIS — M9905 Segmental and somatic dysfunction of pelvic region: Secondary | ICD-10-CM | POA: Diagnosis not present

## 2014-03-31 DIAGNOSIS — M9902 Segmental and somatic dysfunction of thoracic region: Secondary | ICD-10-CM | POA: Diagnosis not present

## 2014-03-31 DIAGNOSIS — M6240 Contracture of muscle, unspecified site: Secondary | ICD-10-CM | POA: Diagnosis not present

## 2014-03-31 DIAGNOSIS — M461 Sacroiliitis, not elsewhere classified: Secondary | ICD-10-CM | POA: Diagnosis not present

## 2014-03-31 DIAGNOSIS — M9901 Segmental and somatic dysfunction of cervical region: Secondary | ICD-10-CM | POA: Diagnosis not present

## 2014-03-31 DIAGNOSIS — M5020 Other cervical disc displacement, unspecified cervical region: Secondary | ICD-10-CM | POA: Diagnosis not present

## 2014-03-31 DIAGNOSIS — M797 Fibromyalgia: Secondary | ICD-10-CM | POA: Diagnosis not present

## 2014-04-24 ENCOUNTER — Ambulatory Visit (INDEPENDENT_AMBULATORY_CARE_PROVIDER_SITE_OTHER): Payer: Medicare Other | Admitting: Internal Medicine

## 2014-04-24 ENCOUNTER — Encounter: Payer: Self-pay | Admitting: Internal Medicine

## 2014-04-24 VITALS — BP 118/76 | HR 73 | Temp 97.6°F | Ht 70.0 in | Wt 197.1 lb

## 2014-04-24 DIAGNOSIS — M722 Plantar fascial fibromatosis: Secondary | ICD-10-CM | POA: Diagnosis not present

## 2014-04-24 DIAGNOSIS — J019 Acute sinusitis, unspecified: Secondary | ICD-10-CM | POA: Insufficient documentation

## 2014-04-24 DIAGNOSIS — J0100 Acute maxillary sinusitis, unspecified: Secondary | ICD-10-CM | POA: Diagnosis not present

## 2014-04-24 DIAGNOSIS — H579 Unspecified disorder of eye and adnexa: Secondary | ICD-10-CM | POA: Diagnosis not present

## 2014-04-24 MED ORDER — AMOXICILLIN-POT CLAVULANATE 875-125 MG PO TABS: 1.0000 | ORAL_TABLET | Freq: Two times a day (BID) | ORAL | Status: DC

## 2014-04-24 NOTE — Progress Notes (Signed)
Pre visit review using our clinic review tool, if applicable. No additional management support is needed unless otherwise documented below in the visit note. 

## 2014-04-24 NOTE — Assessment & Plan Note (Signed)
Left eye papule. Recommended opthalmology evaluation. Pt will set up eval with [facility].

## 2014-04-24 NOTE — Progress Notes (Signed)
Subjective:    Patient ID: [name]., male    DOB: [birth year], 59 y.o.   MRN: [id number]  HPI  59YO male presents for acute visit.  Sinus congestion and cough x 3 weeks. Headache. Sinus congestion is purulent.  Cough is generally non-productive. No fever. Continues to have some chronic dyspnea, but no change from baseline. Taking Mucinex D with minimal improvement.  Concerned about new "bump" he noticed in left eye. Not painful. History of trauma to left eye in distant past.  Also concerned about persistent left heel pain. Bought some new shoes for better support, but continues to have some intermittent stabbing pain. This improves with ambulation.  Past medical, surgical, family and social history per today's encounter.  Review of Systems  Constitutional: Positive for fatigue. Negative for fever, chills and activity change.  HENT: Positive for congestion, rhinorrhea and sinus pressure. Negative for ear discharge, ear pain, hearing loss, nosebleeds, postnasal drip, sneezing, sore throat, tinnitus, trouble swallowing and voice change.   Eyes: Negative for discharge, redness, itching and visual disturbance.  Respiratory: Positive for cough. Negative for chest tightness, shortness of breath, wheezing and stridor.   Cardiovascular: Negative for chest pain and leg swelling.  Musculoskeletal: Negative for myalgias, arthralgias, neck pain and neck stiffness.  Skin: Negative for color change and rash.  Neurological: Negative for dizziness, facial asymmetry and headaches.  Psychiatric/Behavioral: Negative for sleep disturbance.       Objective:    BP 118/76 mmHg  Pulse 73  Temp(Src) 97.6 F (36.4 C) (Oral)  Ht 5\' 10"  (1.778 m)  Wt 197 lb 2 oz (89.415 kg)  BMI 28.28 kg/m2  SpO2 97% Physical Exam  Constitutional: He is oriented to person, place, and time. He appears well-developed and well-nourished. No distress.  HENT:  Head: Normocephalic and atraumatic.  Right Ear:  Tympanic membrane and external ear normal.  Left Ear: Tympanic membrane and external ear normal.  Nose: Mucosal edema present. Right sinus exhibits maxillary sinus tenderness. Left sinus exhibits maxillary sinus tenderness.  Mouth/Throat: Oropharynx is clear and moist. No oropharyngeal exudate.  Eyes: Conjunctivae and EOM are normal. Pupils are equal, round, and reactive to light. Right eye exhibits no discharge. Left eye exhibits no discharge. No scleral icterus.    Neck: Normal range of motion. Neck supple. No tracheal deviation present. No thyromegaly present.  Cardiovascular: Normal rate, regular rhythm and normal heart sounds.  Exam reveals no gallop and no friction rub.   No murmur heard. Pulmonary/Chest: Effort normal and breath sounds normal. No accessory muscle usage. No tachypnea. No respiratory distress. He has no decreased breath sounds. He has no wheezes. He has no rhonchi. He has no rales. He exhibits no tenderness.  Musculoskeletal: Normal range of motion. He exhibits no edema.       Left foot: There is tenderness (plantar surface of heel and posterior heel). There is normal range of motion.  Lymphadenopathy:    He has no cervical adenopathy.  Neurological: He is alert and oriented to person, place, and time. No cranial nerve deficit. Coordination normal.  Skin: Skin is warm and dry. No rash noted. He is not diaphoretic. No erythema. No pallor.  Psychiatric: He has a normal mood and affect. His behavior is normal. Judgment and thought content normal.          Assessment & Plan:   Problem List Items Addressed This Visit      Unprioritized   Acute sinusitis - Primary    Symptoms  and exam c/w acute sinusitis. Will start Augmentin. Continue Mucinex D. Follow up next week prn if symptoms are not improving.      Relevant Medications   AMOXICILLIN-POT CLAVULANATE 875-125 MG PO TABS   Eye lesion    Left eye papule. Recommended opthalmology evaluation. Pt will set up eval  with [facility].      Plantar fasciitis of left foot    Mild persistent left heel pain c/w plantar fasciitis. Recommended supportive footwear, prn NSAIDs. Discussed referral to podiatry for steroid injection, but he would like to hold off for now.          Return if symptoms worsen or fail to improve.

## 2014-04-24 NOTE — Patient Instructions (Signed)

## 2014-04-24 NOTE — Assessment & Plan Note (Signed)
Symptoms and exam c/w acute sinusitis. Will start Augmentin. Continue Mucinex D. Follow up next week prn if symptoms are not improving.

## 2014-04-24 NOTE — Assessment & Plan Note (Signed)
Mild persistent left heel pain c/w plantar fasciitis. Recommended supportive footwear, prn NSAIDs. Discussed referral to podiatry for steroid injection, but he would like to hold off for now.

## 2014-05-06 ENCOUNTER — Other Ambulatory Visit: Payer: Self-pay | Admitting: Internal Medicine

## 2014-05-06 NOTE — Telephone Encounter (Signed)
Okay to refill? Last OV pertaining to anxiety was 12/26/13

## 2014-05-15 ENCOUNTER — Other Ambulatory Visit: Payer: Self-pay | Admitting: Nurse Practitioner

## 2014-05-16 NOTE — Telephone Encounter (Signed)
Ok refill, last visit 04/24/14

## 2014-05-30 ENCOUNTER — Other Ambulatory Visit: Payer: Self-pay | Admitting: Internal Medicine

## 2014-06-02 NOTE — Telephone Encounter (Signed)
Ok refill? 

## 2014-06-06 ENCOUNTER — Encounter: Payer: Self-pay | Admitting: Internal Medicine

## 2014-06-11 ENCOUNTER — Other Ambulatory Visit: Payer: Self-pay | Admitting: Internal Medicine

## 2014-06-12 ENCOUNTER — Telehealth: Payer: Self-pay | Admitting: *Deleted

## 2014-06-12 ENCOUNTER — Other Ambulatory Visit (INDEPENDENT_AMBULATORY_CARE_PROVIDER_SITE_OTHER): Payer: Medicare Other

## 2014-06-12 DIAGNOSIS — E785 Hyperlipidemia, unspecified: Secondary | ICD-10-CM

## 2014-06-12 DIAGNOSIS — F411 Generalized anxiety disorder: Secondary | ICD-10-CM

## 2014-06-12 NOTE — Telephone Encounter (Signed)
Labs and dx?  

## 2014-06-12 NOTE — Telephone Encounter (Signed)
Order placed for labs.

## 2014-06-13 ENCOUNTER — Other Ambulatory Visit: Payer: Self-pay | Admitting: Internal Medicine

## 2014-06-13 ENCOUNTER — Other Ambulatory Visit (INDEPENDENT_AMBULATORY_CARE_PROVIDER_SITE_OTHER): Payer: Medicare Other

## 2014-06-13 LAB — LIPID PANEL
Cholesterol: 163 mg/dL (ref 0–200)
HDL: 46.9 mg/dL (ref >39.00)
LDL Cholesterol: 90 mg/dL (ref 0–99)
NonHDL: 116.1
Total CHOL/HDL Ratio: 3
Triglycerides: 133 mg/dL (ref 0.0–149.0)
VLDL: 26.6 mg/dL (ref 0.0–40.0)

## 2014-06-13 LAB — CBC WITH DIFFERENTIAL/PLATELET
Basophils Absolute: 0 10*3/uL (ref 0.0–0.1)
Basophils Relative: 0.8 % (ref 0.0–3.0)
Eosinophils Absolute: 0.1 10*3/uL (ref 0.0–0.7)
Eosinophils Relative: 2.6 % (ref 0.0–5.0)
HCT: 43.5 % (ref 39.0–52.0)
Hemoglobin: 14.8 g/dL (ref 13.0–17.0)
Lymphocytes Relative: 39.4 % (ref 12.0–46.0)
Lymphs Abs: 2.1 10*3/uL (ref 0.7–4.0)
MCHC: 34.1 g/dL (ref 30.0–36.0)
MCV: 87.3 fl (ref 78.0–100.0)
Monocytes Absolute: 0.6 10*3/uL (ref 0.1–1.0)
Monocytes Relative: 10.4 % (ref 3.0–12.0)
Neutro Abs: 2.5 10*3/uL (ref 1.4–7.7)
Neutrophils Relative %: 46.8 % (ref 43.0–77.0)
Platelets: 200 10*3/uL (ref 150.0–400.0)
RBC: 4.98 Mil/uL (ref 4.22–5.81)
RDW: 14.5 % (ref 11.5–15.5)
WBC: 5.3 10*3/uL (ref 4.0–10.5)

## 2014-06-13 LAB — COMPREHENSIVE METABOLIC PANEL
ALT: 13 U/L (ref 0–53)
AST: 18 U/L (ref 0–37)
Albumin: 3.8 g/dL (ref 3.5–5.2)
Alkaline Phosphatase: 58 U/L (ref 39–117)
BUN: 14 mg/dL (ref 6–23)
CO2: 26 mEq/L (ref 19–32)
Calcium: 9.3 mg/dL (ref 8.4–10.5)
Chloride: 106 mEq/L (ref 96–112)
Creatinine, Ser: 1.01 mg/dL (ref 0.40–1.50)
GFR: 80.61 mL/min (ref >60.00)
Glucose, Bld: 103 mg/dL — ABNORMAL HIGH (ref 70–99)
Potassium: 4 mEq/L (ref 3.5–5.1)
Sodium: 140 mEq/L (ref 135–145)
Total Bilirubin: 1.4 mg/dL — ABNORMAL HIGH (ref 0.2–1.2)
Total Protein: 6.4 g/dL (ref 6.0–8.3)

## 2014-06-13 LAB — BILIRUBIN, DIRECT: Bilirubin, Direct: 0.2 mg/dL (ref 0.0–0.3)

## 2014-06-13 LAB — TSH: TSH: 0.72 u[IU]/mL (ref 0.35–4.50)

## 2014-06-13 NOTE — Progress Notes (Signed)
Order placed for direct bili

## 2014-07-11 DIAGNOSIS — M5431 Sciatica, right side: Secondary | ICD-10-CM | POA: Diagnosis not present

## 2014-07-11 DIAGNOSIS — M6240 Contracture of muscle, unspecified site: Secondary | ICD-10-CM | POA: Diagnosis not present

## 2014-07-11 DIAGNOSIS — M9901 Segmental and somatic dysfunction of cervical region: Secondary | ICD-10-CM | POA: Diagnosis not present

## 2014-07-11 DIAGNOSIS — M9902 Segmental and somatic dysfunction of thoracic region: Secondary | ICD-10-CM | POA: Diagnosis not present

## 2014-07-11 DIAGNOSIS — M9905 Segmental and somatic dysfunction of pelvic region: Secondary | ICD-10-CM | POA: Diagnosis not present

## 2014-07-11 DIAGNOSIS — Z72820 Sleep deprivation: Secondary | ICD-10-CM | POA: Diagnosis not present

## 2014-07-11 DIAGNOSIS — M5020 Other cervical disc displacement, unspecified cervical region: Secondary | ICD-10-CM | POA: Diagnosis not present

## 2014-07-11 DIAGNOSIS — M797 Fibromyalgia: Secondary | ICD-10-CM | POA: Diagnosis not present

## 2014-07-11 DIAGNOSIS — M461 Sacroiliitis, not elsewhere classified: Secondary | ICD-10-CM | POA: Diagnosis not present

## 2014-07-11 DIAGNOSIS — M9903 Segmental and somatic dysfunction of lumbar region: Secondary | ICD-10-CM | POA: Diagnosis not present

## 2014-07-14 DIAGNOSIS — M461 Sacroiliitis, not elsewhere classified: Secondary | ICD-10-CM | POA: Diagnosis not present

## 2014-07-14 DIAGNOSIS — M9901 Segmental and somatic dysfunction of cervical region: Secondary | ICD-10-CM | POA: Diagnosis not present

## 2014-07-14 DIAGNOSIS — Z72820 Sleep deprivation: Secondary | ICD-10-CM | POA: Diagnosis not present

## 2014-07-14 DIAGNOSIS — M9903 Segmental and somatic dysfunction of lumbar region: Secondary | ICD-10-CM | POA: Diagnosis not present

## 2014-07-14 DIAGNOSIS — M5431 Sciatica, right side: Secondary | ICD-10-CM | POA: Diagnosis not present

## 2014-07-14 DIAGNOSIS — M9902 Segmental and somatic dysfunction of thoracic region: Secondary | ICD-10-CM | POA: Diagnosis not present

## 2014-07-14 DIAGNOSIS — M9905 Segmental and somatic dysfunction of pelvic region: Secondary | ICD-10-CM | POA: Diagnosis not present

## 2014-07-14 DIAGNOSIS — M797 Fibromyalgia: Secondary | ICD-10-CM | POA: Diagnosis not present

## 2014-07-14 DIAGNOSIS — M5020 Other cervical disc displacement, unspecified cervical region: Secondary | ICD-10-CM | POA: Diagnosis not present

## 2014-07-14 DIAGNOSIS — M6240 Contracture of muscle, unspecified site: Secondary | ICD-10-CM | POA: Diagnosis not present

## 2014-07-16 ENCOUNTER — Encounter: Payer: Self-pay | Admitting: Internal Medicine

## 2014-07-16 ENCOUNTER — Other Ambulatory Visit: Payer: Self-pay | Admitting: Internal Medicine

## 2014-07-21 ENCOUNTER — Other Ambulatory Visit: Payer: Self-pay | Admitting: *Deleted

## 2014-07-21 MED ORDER — ESCITALOPRAM OXALATE 10 MG PO TABS: 10.0000 mg | ORAL_TABLET | Freq: Every day | ORAL | Status: DC

## 2014-07-23 ENCOUNTER — Ambulatory Visit: Payer: Medicare Other | Admitting: Internal Medicine

## 2014-07-24 ENCOUNTER — Other Ambulatory Visit: Payer: Self-pay | Admitting: Internal Medicine

## 2014-07-24 NOTE — Telephone Encounter (Signed)
Ok refill? 

## 2014-07-25 DIAGNOSIS — S39012D Strain of muscle, fascia and tendon of lower back, subsequent encounter: Secondary | ICD-10-CM | POA: Diagnosis not present

## 2014-07-25 DIAGNOSIS — M542 Cervicalgia: Secondary | ICD-10-CM | POA: Diagnosis not present

## 2014-07-25 DIAGNOSIS — M545 Low back pain: Secondary | ICD-10-CM | POA: Diagnosis not present

## 2014-07-25 DIAGNOSIS — S161XXD Strain of muscle, fascia and tendon at neck level, subsequent encounter: Secondary | ICD-10-CM | POA: Diagnosis not present

## 2014-08-01 DIAGNOSIS — M9901 Segmental and somatic dysfunction of cervical region: Secondary | ICD-10-CM | POA: Diagnosis not present

## 2014-08-01 DIAGNOSIS — M9903 Segmental and somatic dysfunction of lumbar region: Secondary | ICD-10-CM | POA: Diagnosis not present

## 2014-08-01 DIAGNOSIS — M461 Sacroiliitis, not elsewhere classified: Secondary | ICD-10-CM | POA: Diagnosis not present

## 2014-08-01 DIAGNOSIS — M5020 Other cervical disc displacement, unspecified cervical region: Secondary | ICD-10-CM | POA: Diagnosis not present

## 2014-08-01 DIAGNOSIS — M5431 Sciatica, right side: Secondary | ICD-10-CM | POA: Diagnosis not present

## 2014-08-01 DIAGNOSIS — Z72820 Sleep deprivation: Secondary | ICD-10-CM | POA: Diagnosis not present

## 2014-08-01 DIAGNOSIS — M797 Fibromyalgia: Secondary | ICD-10-CM | POA: Diagnosis not present

## 2014-08-01 DIAGNOSIS — M9902 Segmental and somatic dysfunction of thoracic region: Secondary | ICD-10-CM | POA: Diagnosis not present

## 2014-08-01 DIAGNOSIS — M9905 Segmental and somatic dysfunction of pelvic region: Secondary | ICD-10-CM | POA: Diagnosis not present

## 2014-08-01 DIAGNOSIS — M6240 Contracture of muscle, unspecified site: Secondary | ICD-10-CM | POA: Diagnosis not present

## 2014-08-04 DIAGNOSIS — M9905 Segmental and somatic dysfunction of pelvic region: Secondary | ICD-10-CM | POA: Diagnosis not present

## 2014-08-04 DIAGNOSIS — M5020 Other cervical disc displacement, unspecified cervical region: Secondary | ICD-10-CM | POA: Diagnosis not present

## 2014-08-04 DIAGNOSIS — M461 Sacroiliitis, not elsewhere classified: Secondary | ICD-10-CM | POA: Diagnosis not present

## 2014-08-04 DIAGNOSIS — M9903 Segmental and somatic dysfunction of lumbar region: Secondary | ICD-10-CM | POA: Diagnosis not present

## 2014-08-04 DIAGNOSIS — M9902 Segmental and somatic dysfunction of thoracic region: Secondary | ICD-10-CM | POA: Diagnosis not present

## 2014-08-04 DIAGNOSIS — M5431 Sciatica, right side: Secondary | ICD-10-CM | POA: Diagnosis not present

## 2014-08-04 DIAGNOSIS — M6240 Contracture of muscle, unspecified site: Secondary | ICD-10-CM | POA: Diagnosis not present

## 2014-08-04 DIAGNOSIS — Z72820 Sleep deprivation: Secondary | ICD-10-CM | POA: Diagnosis not present

## 2014-08-04 DIAGNOSIS — M797 Fibromyalgia: Secondary | ICD-10-CM | POA: Diagnosis not present

## 2014-08-04 DIAGNOSIS — M9901 Segmental and somatic dysfunction of cervical region: Secondary | ICD-10-CM | POA: Diagnosis not present

## 2014-08-15 ENCOUNTER — Other Ambulatory Visit: Payer: Self-pay | Admitting: Internal Medicine

## 2014-08-18 DIAGNOSIS — M6240 Contracture of muscle, unspecified site: Secondary | ICD-10-CM | POA: Diagnosis not present

## 2014-08-18 DIAGNOSIS — M797 Fibromyalgia: Secondary | ICD-10-CM | POA: Diagnosis not present

## 2014-08-18 DIAGNOSIS — M9905 Segmental and somatic dysfunction of pelvic region: Secondary | ICD-10-CM | POA: Diagnosis not present

## 2014-08-18 DIAGNOSIS — M461 Sacroiliitis, not elsewhere classified: Secondary | ICD-10-CM | POA: Diagnosis not present

## 2014-08-18 DIAGNOSIS — Z72820 Sleep deprivation: Secondary | ICD-10-CM | POA: Diagnosis not present

## 2014-08-18 DIAGNOSIS — M9901 Segmental and somatic dysfunction of cervical region: Secondary | ICD-10-CM | POA: Diagnosis not present

## 2014-08-18 DIAGNOSIS — M9902 Segmental and somatic dysfunction of thoracic region: Secondary | ICD-10-CM | POA: Diagnosis not present

## 2014-08-18 DIAGNOSIS — M9903 Segmental and somatic dysfunction of lumbar region: Secondary | ICD-10-CM | POA: Diagnosis not present

## 2014-08-18 DIAGNOSIS — M5020 Other cervical disc displacement, unspecified cervical region: Secondary | ICD-10-CM | POA: Diagnosis not present

## 2014-08-18 DIAGNOSIS — M5431 Sciatica, right side: Secondary | ICD-10-CM | POA: Diagnosis not present

## 2014-08-27 ENCOUNTER — Encounter: Payer: Self-pay | Admitting: Internal Medicine

## 2014-08-27 ENCOUNTER — Ambulatory Visit (INDEPENDENT_AMBULATORY_CARE_PROVIDER_SITE_OTHER): Payer: Medicare Other | Admitting: Internal Medicine

## 2014-08-27 ENCOUNTER — Telehealth: Payer: Self-pay | Admitting: Internal Medicine

## 2014-08-27 VITALS — BP 115/73 | HR 57 | Temp 98.1°F | Ht 70.0 in | Wt 197.5 lb

## 2014-08-27 DIAGNOSIS — M4802 Spinal stenosis, cervical region: Secondary | ICD-10-CM

## 2014-08-27 DIAGNOSIS — F411 Generalized anxiety disorder: Secondary | ICD-10-CM | POA: Diagnosis not present

## 2014-08-27 DIAGNOSIS — N400 Enlarged prostate without lower urinary tract symptoms: Secondary | ICD-10-CM

## 2014-08-27 DIAGNOSIS — R059 Cough, unspecified: Secondary | ICD-10-CM

## 2014-08-27 DIAGNOSIS — R05 Cough: Secondary | ICD-10-CM

## 2014-08-27 MED ORDER — ESCITALOPRAM OXALATE 5 MG PO TABS: 5.0000 mg | ORAL_TABLET | Freq: Every day | ORAL | Status: DC

## 2014-08-27 NOTE — Progress Notes (Signed)
Subjective:    Patient ID: Tyler Loop., male    DOB: September 29, 1955, 59 y.o.   MRN: [id number]  HPI  59YO male presents for follow up.  Anxiety - Would like to decrease Lexapro back to 5mg  as this seemed to control anxiety. 10mg  dosing seems to cause constipation. No longer taking Ambien because sleeping better night.  BPH - Urinating about 2x per night. Seems improved at night. Taking Cialis daily.  GERD - Tried time off Pantoprazole, but had recurrent symptoms. Restarted medication with improvement.  Chronic neck and back pain relatively unchanged. Continues PT with some improvement and taking Flexeril at night with improvement.  Past medical, surgical, family and social history per today's encounter.  Review of Systems  Constitutional: Negative for fever, chills, activity change, appetite change, fatigue and unexpected weight change.  Eyes: Negative for visual disturbance.  Respiratory: Negative for cough and shortness of breath.   Cardiovascular: Negative for chest pain, palpitations and leg swelling.  Gastrointestinal: Negative for abdominal pain and abdominal distention.  Genitourinary: Negative for dysuria, urgency and difficulty urinating.  Musculoskeletal: Positive for myalgias, arthralgias and neck pain. Negative for gait problem.  Skin: Negative for color change and rash.  Hematological: Negative for adenopathy.  Psychiatric/Behavioral: Negative for sleep disturbance and dysphoric mood. The patient is not nervous/anxious.        Objective:    BP 115/73 mmHg  Pulse 57  Temp(Src) 98.1 F (36.7 C) (Oral)  Ht 5\' 10"  (1.778 m)  Wt 197 lb 8 oz (89.585 kg)  BMI 28.34 kg/m2  SpO2 97% Physical Exam  Constitutional: He is oriented to person, place, and time. He appears well-developed and well-nourished. No distress.  HENT:  Head: Normocephalic and atraumatic.  Right Ear: External ear normal.  Left Ear: External ear normal.  Nose: Nose normal.  Mouth/Throat:  Oropharynx is clear and moist. No oropharyngeal exudate.  Eyes: Conjunctivae and EOM are normal. Pupils are equal, round, and reactive to light. Right eye exhibits no discharge. Left eye exhibits no discharge. No scleral icterus.  Neck: Normal range of motion. Neck supple. No tracheal deviation present. No thyromegaly present.  Cardiovascular: Normal rate, regular rhythm and normal heart sounds.  Exam reveals no gallop and no friction rub.   No murmur heard. Pulmonary/Chest: Effort normal and breath sounds normal. No accessory muscle usage. No tachypnea. No respiratory distress. He has no decreased breath sounds. He has no wheezes. He has no rhonchi. He has no rales. He exhibits no tenderness.  Musculoskeletal: Normal range of motion. He exhibits no edema.  Lymphadenopathy:    He has no cervical adenopathy.  Neurological: He is alert and oriented to person, place, and time. No cranial nerve deficit. Coordination normal.  Skin: Skin is warm and dry. No rash noted. He is not diaphoretic. No erythema. No pallor.  Psychiatric: His speech is normal and behavior is normal. Judgment and thought content normal. His mood appears anxious. Cognition and memory are normal.          Assessment & Plan:   Problem List Items Addressed This Visit      Unprioritized   BPH (benign prostatic hyperplasia)    Symptoms well controlled with Cialis. Will continue.      Cough    S/p evaluation with Dr. Melvyn Novas. Cough likely secondary to GERD and air swallowing. Will continue Pantoprazole.      Generalized anxiety disorder - Primary    Symptoms improved with Lexapro. He would like to reduce dose  to 5mg  daily, so will try lower dose. He will call if any problems at lower dose. Follow up in 6 months and prn.      Spinal stenosis in cervical region    Chronic pain in neck and upper back. Will continue PT and meloxicam and prn flexeril.          Return in about 6 months (around 02/27/2015) for  Physical.

## 2014-08-27 NOTE — Assessment & Plan Note (Signed)
Symptoms improved with Lexapro. He would like to reduce dose to 5mg  daily, so will try lower dose. He will call if any problems at lower dose. Follow up in 6 months and prn.

## 2014-08-27 NOTE — Progress Notes (Signed)
Pre visit review using our clinic review tool, if applicable. No additional management support is needed unless otherwise documented below in the visit note. 

## 2014-08-27 NOTE — Assessment & Plan Note (Signed)
S/p evaluation with Dr. Melvyn Novas. Cough likely secondary to GERD and air swallowing. Will continue Pantoprazole.

## 2014-08-27 NOTE — Assessment & Plan Note (Signed)
Symptoms well controlled with Cialis. Will continue.

## 2014-08-27 NOTE — Assessment & Plan Note (Signed)
Chronic pain in neck and upper back. Will continue PT and meloxicam and prn flexeril.

## 2014-08-27 NOTE — Patient Instructions (Signed)
Decrease Lexapro to 5mg  daily.  Follow up in 6 months or sooner as needed.

## 2014-08-27 NOTE — Telephone Encounter (Signed)
Pt dropped off [employer] letter to be filled out. Letter in [place] box/msn

## 2014-08-28 NOTE — Telephone Encounter (Signed)
Form completed and picked up by the pt

## 2014-08-29 DIAGNOSIS — M5020 Other cervical disc displacement, unspecified cervical region: Secondary | ICD-10-CM | POA: Diagnosis not present

## 2014-08-29 DIAGNOSIS — M461 Sacroiliitis, not elsewhere classified: Secondary | ICD-10-CM | POA: Diagnosis not present

## 2014-08-29 DIAGNOSIS — M6240 Contracture of muscle, unspecified site: Secondary | ICD-10-CM | POA: Diagnosis not present

## 2014-08-29 DIAGNOSIS — M9902 Segmental and somatic dysfunction of thoracic region: Secondary | ICD-10-CM | POA: Diagnosis not present

## 2014-08-29 DIAGNOSIS — Z72820 Sleep deprivation: Secondary | ICD-10-CM | POA: Diagnosis not present

## 2014-08-29 DIAGNOSIS — M791 Myalgia: Secondary | ICD-10-CM | POA: Diagnosis not present

## 2014-08-29 DIAGNOSIS — M9903 Segmental and somatic dysfunction of lumbar region: Secondary | ICD-10-CM | POA: Diagnosis not present

## 2014-08-29 DIAGNOSIS — M9901 Segmental and somatic dysfunction of cervical region: Secondary | ICD-10-CM | POA: Diagnosis not present

## 2014-08-29 DIAGNOSIS — M5431 Sciatica, right side: Secondary | ICD-10-CM | POA: Diagnosis not present

## 2014-08-29 DIAGNOSIS — M9905 Segmental and somatic dysfunction of pelvic region: Secondary | ICD-10-CM | POA: Diagnosis not present

## 2014-08-30 ENCOUNTER — Other Ambulatory Visit: Payer: Self-pay | Admitting: Internal Medicine

## 2014-09-01 DIAGNOSIS — M461 Sacroiliitis, not elsewhere classified: Secondary | ICD-10-CM | POA: Diagnosis not present

## 2014-09-01 DIAGNOSIS — M791 Myalgia: Secondary | ICD-10-CM | POA: Diagnosis not present

## 2014-09-01 DIAGNOSIS — M9902 Segmental and somatic dysfunction of thoracic region: Secondary | ICD-10-CM | POA: Diagnosis not present

## 2014-09-01 DIAGNOSIS — M9901 Segmental and somatic dysfunction of cervical region: Secondary | ICD-10-CM | POA: Diagnosis not present

## 2014-09-01 DIAGNOSIS — M9905 Segmental and somatic dysfunction of pelvic region: Secondary | ICD-10-CM | POA: Diagnosis not present

## 2014-09-01 DIAGNOSIS — M5020 Other cervical disc displacement, unspecified cervical region: Secondary | ICD-10-CM | POA: Diagnosis not present

## 2014-09-01 DIAGNOSIS — M5431 Sciatica, right side: Secondary | ICD-10-CM | POA: Diagnosis not present

## 2014-09-01 DIAGNOSIS — M6240 Contracture of muscle, unspecified site: Secondary | ICD-10-CM | POA: Diagnosis not present

## 2014-09-01 DIAGNOSIS — Z72820 Sleep deprivation: Secondary | ICD-10-CM | POA: Diagnosis not present

## 2014-09-01 DIAGNOSIS — M9903 Segmental and somatic dysfunction of lumbar region: Secondary | ICD-10-CM | POA: Diagnosis not present

## 2014-09-08 ENCOUNTER — Other Ambulatory Visit: Payer: Self-pay

## 2014-09-08 MED ORDER — ATORVASTATIN CALCIUM 10 MG PO TABS: 10.0000 mg | ORAL_TABLET | Freq: Every day | ORAL | Status: DC

## 2014-09-12 DIAGNOSIS — M9901 Segmental and somatic dysfunction of cervical region: Secondary | ICD-10-CM | POA: Diagnosis not present

## 2014-09-12 DIAGNOSIS — M6240 Contracture of muscle, unspecified site: Secondary | ICD-10-CM | POA: Diagnosis not present

## 2014-09-12 DIAGNOSIS — M9902 Segmental and somatic dysfunction of thoracic region: Secondary | ICD-10-CM | POA: Diagnosis not present

## 2014-09-12 DIAGNOSIS — M9905 Segmental and somatic dysfunction of pelvic region: Secondary | ICD-10-CM | POA: Diagnosis not present

## 2014-09-12 DIAGNOSIS — M5020 Other cervical disc displacement, unspecified cervical region: Secondary | ICD-10-CM | POA: Diagnosis not present

## 2014-09-12 DIAGNOSIS — M461 Sacroiliitis, not elsewhere classified: Secondary | ICD-10-CM | POA: Diagnosis not present

## 2014-09-12 DIAGNOSIS — M5431 Sciatica, right side: Secondary | ICD-10-CM | POA: Diagnosis not present

## 2014-09-12 DIAGNOSIS — Z72820 Sleep deprivation: Secondary | ICD-10-CM | POA: Diagnosis not present

## 2014-09-12 DIAGNOSIS — M9903 Segmental and somatic dysfunction of lumbar region: Secondary | ICD-10-CM | POA: Diagnosis not present

## 2014-09-12 DIAGNOSIS — M791 Myalgia: Secondary | ICD-10-CM | POA: Diagnosis not present

## 2014-09-15 DIAGNOSIS — M9901 Segmental and somatic dysfunction of cervical region: Secondary | ICD-10-CM | POA: Diagnosis not present

## 2014-09-15 DIAGNOSIS — M461 Sacroiliitis, not elsewhere classified: Secondary | ICD-10-CM | POA: Diagnosis not present

## 2014-09-15 DIAGNOSIS — M9905 Segmental and somatic dysfunction of pelvic region: Secondary | ICD-10-CM | POA: Diagnosis not present

## 2014-09-15 DIAGNOSIS — M791 Myalgia: Secondary | ICD-10-CM | POA: Diagnosis not present

## 2014-09-15 DIAGNOSIS — M6240 Contracture of muscle, unspecified site: Secondary | ICD-10-CM | POA: Diagnosis not present

## 2014-09-15 DIAGNOSIS — M9902 Segmental and somatic dysfunction of thoracic region: Secondary | ICD-10-CM | POA: Diagnosis not present

## 2014-09-15 DIAGNOSIS — M5431 Sciatica, right side: Secondary | ICD-10-CM | POA: Diagnosis not present

## 2014-09-15 DIAGNOSIS — M9903 Segmental and somatic dysfunction of lumbar region: Secondary | ICD-10-CM | POA: Diagnosis not present

## 2014-09-15 DIAGNOSIS — M5020 Other cervical disc displacement, unspecified cervical region: Secondary | ICD-10-CM | POA: Diagnosis not present

## 2014-09-15 DIAGNOSIS — Z72820 Sleep deprivation: Secondary | ICD-10-CM | POA: Diagnosis not present

## 2014-09-22 DIAGNOSIS — M9905 Segmental and somatic dysfunction of pelvic region: Secondary | ICD-10-CM | POA: Diagnosis not present

## 2014-09-22 DIAGNOSIS — M6240 Contracture of muscle, unspecified site: Secondary | ICD-10-CM | POA: Diagnosis not present

## 2014-09-22 DIAGNOSIS — M5431 Sciatica, right side: Secondary | ICD-10-CM | POA: Diagnosis not present

## 2014-09-22 DIAGNOSIS — M461 Sacroiliitis, not elsewhere classified: Secondary | ICD-10-CM | POA: Diagnosis not present

## 2014-09-22 DIAGNOSIS — M5020 Other cervical disc displacement, unspecified cervical region: Secondary | ICD-10-CM | POA: Diagnosis not present

## 2014-09-22 DIAGNOSIS — M791 Myalgia: Secondary | ICD-10-CM | POA: Diagnosis not present

## 2014-09-22 DIAGNOSIS — M9903 Segmental and somatic dysfunction of lumbar region: Secondary | ICD-10-CM | POA: Diagnosis not present

## 2014-09-22 DIAGNOSIS — M9901 Segmental and somatic dysfunction of cervical region: Secondary | ICD-10-CM | POA: Diagnosis not present

## 2014-09-22 DIAGNOSIS — M9902 Segmental and somatic dysfunction of thoracic region: Secondary | ICD-10-CM | POA: Diagnosis not present

## 2014-09-22 DIAGNOSIS — Z72820 Sleep deprivation: Secondary | ICD-10-CM | POA: Diagnosis not present

## 2014-09-26 ENCOUNTER — Encounter: Payer: Self-pay | Admitting: *Deleted

## 2014-10-06 DIAGNOSIS — M6240 Contracture of muscle, unspecified site: Secondary | ICD-10-CM | POA: Diagnosis not present

## 2014-10-06 DIAGNOSIS — M5431 Sciatica, right side: Secondary | ICD-10-CM | POA: Diagnosis not present

## 2014-10-06 DIAGNOSIS — M461 Sacroiliitis, not elsewhere classified: Secondary | ICD-10-CM | POA: Diagnosis not present

## 2014-10-06 DIAGNOSIS — M9903 Segmental and somatic dysfunction of lumbar region: Secondary | ICD-10-CM | POA: Diagnosis not present

## 2014-10-06 DIAGNOSIS — M5020 Other cervical disc displacement, unspecified cervical region: Secondary | ICD-10-CM | POA: Diagnosis not present

## 2014-10-06 DIAGNOSIS — M9905 Segmental and somatic dysfunction of pelvic region: Secondary | ICD-10-CM | POA: Diagnosis not present

## 2014-10-06 DIAGNOSIS — M9902 Segmental and somatic dysfunction of thoracic region: Secondary | ICD-10-CM | POA: Diagnosis not present

## 2014-10-06 DIAGNOSIS — Z72820 Sleep deprivation: Secondary | ICD-10-CM | POA: Diagnosis not present

## 2014-10-06 DIAGNOSIS — M9901 Segmental and somatic dysfunction of cervical region: Secondary | ICD-10-CM | POA: Diagnosis not present

## 2014-10-06 DIAGNOSIS — M791 Myalgia: Secondary | ICD-10-CM | POA: Diagnosis not present

## 2014-10-07 ENCOUNTER — Other Ambulatory Visit: Payer: Self-pay | Admitting: Internal Medicine

## 2014-10-07 ENCOUNTER — Encounter: Payer: Self-pay | Admitting: Obstetrics and Gynecology

## 2014-10-07 NOTE — Telephone Encounter (Signed)
Last OV 7.20.16.  Please advise refill 

## 2014-10-14 ENCOUNTER — Ambulatory Visit: Payer: Self-pay | Admitting: Obstetrics and Gynecology

## 2014-10-15 ENCOUNTER — Encounter: Payer: Self-pay | Admitting: Obstetrics and Gynecology

## 2014-10-15 ENCOUNTER — Ambulatory Visit (INDEPENDENT_AMBULATORY_CARE_PROVIDER_SITE_OTHER): Payer: Medicare Other | Admitting: Obstetrics and Gynecology

## 2014-10-15 VITALS — BP 128/76 | HR 56 | Ht 70.0 in | Wt 198.3 lb

## 2014-10-15 DIAGNOSIS — N400 Enlarged prostate without lower urinary tract symptoms: Secondary | ICD-10-CM

## 2014-10-15 DIAGNOSIS — R350 Frequency of micturition: Secondary | ICD-10-CM | POA: Diagnosis not present

## 2014-10-15 LAB — URINALYSIS, COMPLETE
Bilirubin, UA: NEGATIVE
Glucose, UA: NEGATIVE
Ketones, UA: NEGATIVE
Leukocytes, UA: NEGATIVE
Nitrite, UA: NEGATIVE
Protein, UA: NEGATIVE
RBC, UA: NEGATIVE
Specific Gravity, UA: 1.02 (ref 1.005–1.030)
Urobilinogen, Ur: 0.2 mg/dL (ref 0.2–1.0)
pH, UA: 5.5 (ref 5.0–7.5)

## 2014-10-15 LAB — BLADDER SCAN AMB NON-IMAGING: Scan Result: 85

## 2014-10-15 LAB — MICROSCOPIC EXAMINATION
Bacteria, UA: NONE SEEN
Epithelial Cells (non renal): NONE SEEN /hpf (ref 0–10)
RBC, UA: NONE SEEN /hpf (ref 0–?)
WBC, UA: NONE SEEN /hpf (ref 0–?)

## 2014-10-15 NOTE — Progress Notes (Signed)
10/15/2014 9:58 AM   [name] 1955-02-21 [id number]  Referring provider: [name], MD [address] [place], [place] [address]  Chief Complaint  Patient presents with  . Benign Prostatic Hypertrophy    HPI: His IPSS score today is 18. Current urinary symptoms include frequency, urgency and hesitancy.   His previous IPSS score was 18. Today's PVR is 85 mL.   His previous PVR is 79 mL. He denies any recent fevers, chills, nausea or vomiting. He does not have a family history of PCa.  His major complaint today frequency and hesitancy.  Nocturia 1-3 times per night.  He has had these symptoms for many years.  He denies any dysuria, hematuria or suprapubic pain.   He currently taking Cialis 10mg  daily 5mg  in the morning in and 5mg  at night.  Medication prescribed by PCP Dr. Gilford Rile.  Patient was instructed to start Rapaflo and his last visit but he states that he did not start medication because he was worried about lowering his blood pressure.  Previous PSA's: 11/19/13 PSA 3.49  Previous uroflow results: Obstructive pattern Peak flow-4.0 Average flow-3.9 Voiding time-27.1 Flow time-20.8 Time to peak flow-7.2 Void volume -82 mL       Score:  1-7 Mild 8-19 Moderate 20-35 Severe  PMH: Past Medical History  Diagnosis Date  . H/O diverticulitis of colon   . GERD (gastroesophageal reflux disease)   . Hyperlipidemia   . Colon polyps   . Diverticulosis   . Bulging disc     C2/3, C3/4, C6/7  . Cervical disc herniation     C4/5 and C5/6  . Spinal stenosis in cervical region     cord abutment C4/5  . Bulge of lumbar disc without myelopathy     L2/3 through L5/6  . Levoscoliosis   . Hemorrhoid   . Hiatal hernia   . Scoliosis   . Benign prostatic hypertrophy with lower urinary tract symptoms (LUTS)   . Testicular pain, left   . Infection of the upper respiratory tract 12/29/2011  . Infection of the upper respiratory tract 12/29/2011  .  BPH (benign prostatic hyperplasia) 11/19/2013  . Benign prostatic hyperplasia with urinary obstruction 08/23/2012  . 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 Routine general medical examination at a health care facility 08/31/2012  . Testicular pain, left     Surgical History: Past Surgical History  Procedure Laterality Date  . Ganglion cyst excision Right 1994    wrist & back  . Hernia repair Left 1994    abdominal repair with mesh  . Nasal septum surgery  2011    Dr. [name]     Home Medications:    Medication List       This list is accurate as of: 10/15/14  9:58 AM.  Always use your most recent med list.               atorvastatin 10 MG tablet  Commonly known as:  LIPITOR  Take 1 tablet (10 mg total) by mouth daily at 6 PM.     CIALIS 10 MG tablet  Generic drug:  tadalafil  TAKE 1 TABLET BY MOUTH AS NEEDED     cyclobenzaprine 10 MG tablet  Commonly known as:  FLEXERIL  TAKE 1 TABLET (10 MG TOTAL) BY MOUTH 3 (THREE) TIMES DAILY AS NEEDED FOR MUSCLE SPASMS.     escitalopram 10 MG tablet  Commonly known as:  LEXAPRO  TAKE 1 TABLET (  10 MG TOTAL) BY MOUTH DAILY.     escitalopram 5 MG tablet  Commonly known as:  LEXAPRO  Take 1 tablet (5 mg total) by mouth daily.     meloxicam 15 MG tablet  Commonly known as:  MOBIC  TAKE 1 TABLET (15 MG TOTAL) BY MOUTH DAILY.     meloxicam 15 MG tablet  Commonly known as:  MOBIC  TAKE 1 TABLET (15 MG TOTAL) BY MOUTH DAILY.     NASACORT ALLERGY 24HR 55 MCG/ACT Aero nasal inhaler  Generic drug:  triamcinolone  USE 2 SPRAYS IN EACH NOSTRIL DAILY     pantoprazole 40 MG tablet  Commonly known as:  PROTONIX  Take 1 tablet (40 mg total) by mouth daily.        Allergies:  Allergies  Allergen Reactions  . Other Other (See Comments)    Pollen:  Sinus infection, cough, fatigue    Family History: Family History  Problem Relation Age of Onset  . Hyperlipidemia Mother   . Cancer Mother     skin cancer?  .  Heart disease Mother 59    MI - died in her sleep  . Hyperlipidemia Father   . Heart disease Father     Social History:  reports that he quit smoking about 23 years ago. His smoking use included Cigarettes. He quit after 20 years of use. He has never used smokeless tobacco. He reports that he does not drink alcohol or use illicit drugs.  ROS: UROLOGY Frequent Urination?: Yes Hard to postpone urination?: Yes Burning/pain with urination?: No Get up at night to urinate?: Yes Leakage of urine?: No Urine stream starts and stops?: No Trouble starting stream?: No Do you have to strain to urinate?: No Blood in urine?: No Urinary tract infection?: No Sexually transmitted disease?: No Injury to kidneys or bladder?: No Painful intercourse?: No Weak stream?: Yes Erection problems?: No Penile pain?: No  Gastrointestinal Nausea?: No Vomiting?: No Indigestion/heartburn?: No Diarrhea?: No Constipation?: Yes  Constitutional Fever: No Night sweats?: No Weight loss?: No Fatigue?: Yes  Skin Skin rash/lesions?: No Itching?: No  Eyes Blurred vision?: No Double vision?: No  Ears/Nose/Throat Sore throat?: No Sinus problems?: Yes  Hematologic/Lymphatic Swollen glands?: No Easy bruising?: No  Cardiovascular Leg swelling?: No Chest pain?: No  Respiratory Cough?: Yes Shortness of breath?: No  Endocrine Excessive thirst?: No  Musculoskeletal Back pain?: Yes Joint pain?: No  Neurological Headaches?: Yes Dizziness?: No  Psychologic Depression?: No Anxiety?: Yes  Physical Exam: BP 128/76 mmHg  Pulse 56  Ht 5\' 10"  (1.778 m)  Wt 198 lb 4.8 oz (89.948 kg)  BMI 28.45 kg/m2  Constitutional:  Alert and oriented, No acute distress. HEENT: [place] AT, moist mucus membranes.  Trachea midline, no masses. Cardiovascular: No clubbing, cyanosis, or edema. Respiratory: Normal respiratory effort, no increased work of breathing. GU: No CVA tenderness. Prostate +2, smooth and  nonnodular, slight firmness of the right side Skin: No rashes, bruises or suspicious lesions. Lymph: No cervical or inguinal adenopathy. Neurologic: Grossly intact, no focal deficits, moving all 4 extremities. Psychiatric: Normal mood and affect.  Laboratory Data: Lab Results  Component Value Date   WBC 5.3 06/13/2014   HGB 14.8 06/13/2014   HCT 43.5 06/13/2014   MCV 87.3 06/13/2014   PLT 200.0 06/13/2014    Lab Results  Component Value Date   CREATININE 1.01 06/13/2014    Lab Results  Component Value Date   PSA 3.49 11/19/2013   Assessment & Plan:  1. BPH (benign prostatic hyperplasia)-patient doing moderately well on daily Cialis. He is continued to experience significant hesitancy and frequency. I encourage patient to begin taking Rapaflo as we discussed duriing previous visit. We also discussed the possibility of him needing to take finasteride in the future to decreased size of his prostate and hopefully improve urinary symptoms. We will recheck PSA at next visit if patient's PCP has not already done this. Previous PSA's: 11/19/13 PSA 3.49 - PSA  2. Urinary frequency Patient reports he is still experiencing significant daytime urinary frequency. Nocturia 1-3 times per night. He states he significantly reduces his fluid intake and effort to reduce the need to void. He will continue his daily Cialis and start Rapaflo. - Urinalysis, Complete - Bladder Scan (Post Void Residual) in office- 23ml  Return in about 3 months (around 01/14/2015) for recheck BPH.  Herbert Moors, [facility] [address], [place], [place] [address] [phone]

## 2014-10-16 ENCOUNTER — Ambulatory Visit (INDEPENDENT_AMBULATORY_CARE_PROVIDER_SITE_OTHER): Payer: Medicare Other

## 2014-10-16 DIAGNOSIS — Z23 Encounter for immunization: Secondary | ICD-10-CM | POA: Diagnosis not present

## 2014-10-20 DIAGNOSIS — M461 Sacroiliitis, not elsewhere classified: Secondary | ICD-10-CM | POA: Diagnosis not present

## 2014-10-20 DIAGNOSIS — M791 Myalgia: Secondary | ICD-10-CM | POA: Diagnosis not present

## 2014-10-20 DIAGNOSIS — M9903 Segmental and somatic dysfunction of lumbar region: Secondary | ICD-10-CM | POA: Diagnosis not present

## 2014-10-20 DIAGNOSIS — M5431 Sciatica, right side: Secondary | ICD-10-CM | POA: Diagnosis not present

## 2014-10-20 DIAGNOSIS — M9905 Segmental and somatic dysfunction of pelvic region: Secondary | ICD-10-CM | POA: Diagnosis not present

## 2014-10-20 DIAGNOSIS — M6240 Contracture of muscle, unspecified site: Secondary | ICD-10-CM | POA: Diagnosis not present

## 2014-10-20 DIAGNOSIS — M9901 Segmental and somatic dysfunction of cervical region: Secondary | ICD-10-CM | POA: Diagnosis not present

## 2014-10-20 DIAGNOSIS — M9902 Segmental and somatic dysfunction of thoracic region: Secondary | ICD-10-CM | POA: Diagnosis not present

## 2014-10-20 DIAGNOSIS — Z72820 Sleep deprivation: Secondary | ICD-10-CM | POA: Diagnosis not present

## 2014-10-20 DIAGNOSIS — M5020 Other cervical disc displacement, unspecified cervical region: Secondary | ICD-10-CM | POA: Diagnosis not present

## 2014-10-27 DIAGNOSIS — M5020 Other cervical disc displacement, unspecified cervical region: Secondary | ICD-10-CM | POA: Diagnosis not present

## 2014-10-27 DIAGNOSIS — M6240 Contracture of muscle, unspecified site: Secondary | ICD-10-CM | POA: Diagnosis not present

## 2014-10-27 DIAGNOSIS — M9902 Segmental and somatic dysfunction of thoracic region: Secondary | ICD-10-CM | POA: Diagnosis not present

## 2014-10-27 DIAGNOSIS — M9905 Segmental and somatic dysfunction of pelvic region: Secondary | ICD-10-CM | POA: Diagnosis not present

## 2014-10-27 DIAGNOSIS — M9901 Segmental and somatic dysfunction of cervical region: Secondary | ICD-10-CM | POA: Diagnosis not present

## 2014-10-27 DIAGNOSIS — M791 Myalgia: Secondary | ICD-10-CM | POA: Diagnosis not present

## 2014-10-27 DIAGNOSIS — M9903 Segmental and somatic dysfunction of lumbar region: Secondary | ICD-10-CM | POA: Diagnosis not present

## 2014-10-27 DIAGNOSIS — M461 Sacroiliitis, not elsewhere classified: Secondary | ICD-10-CM | POA: Diagnosis not present

## 2014-10-27 DIAGNOSIS — M5431 Sciatica, right side: Secondary | ICD-10-CM | POA: Diagnosis not present

## 2014-10-27 DIAGNOSIS — Z72820 Sleep deprivation: Secondary | ICD-10-CM | POA: Diagnosis not present

## 2014-11-03 ENCOUNTER — Telehealth: Payer: Self-pay | Admitting: Internal Medicine

## 2014-11-03 NOTE — Telephone Encounter (Signed)
Pt called about having a UTI and congestion. Pt was offered to see another provider. Pt states he only wants to see Dr Gilford Rile. No avail appt slot to sch. Let me know where I can sch pt. Pt states mornings are good.Thank You!

## 2014-11-03 NOTE — Telephone Encounter (Signed)
My meeting was cancelled tomorrow morning. All of those slots will be opened. We can put him there.

## 2014-11-03 NOTE — Telephone Encounter (Signed)
Dr. Gilford Rile, Please advise where on the schedule we can put him.  Thanks Lavella Lemons

## 2014-11-03 NOTE — Telephone Encounter (Signed)
Scheduled appointment for tomorrow

## 2014-11-04 ENCOUNTER — Ambulatory Visit (INDEPENDENT_AMBULATORY_CARE_PROVIDER_SITE_OTHER): Payer: Medicare Other | Admitting: Internal Medicine

## 2014-11-04 ENCOUNTER — Encounter: Payer: Self-pay | Admitting: Internal Medicine

## 2014-11-04 VITALS — BP 113/76 | HR 66 | Temp 98.3°F | Ht 70.0 in | Wt 198.2 lb

## 2014-11-04 DIAGNOSIS — B9789 Other viral agents as the cause of diseases classified elsewhere: Secondary | ICD-10-CM

## 2014-11-04 DIAGNOSIS — N451 Epididymitis: Secondary | ICD-10-CM

## 2014-11-04 DIAGNOSIS — J069 Acute upper respiratory infection, unspecified: Secondary | ICD-10-CM | POA: Insufficient documentation

## 2014-11-04 LAB — POCT URINALYSIS DIPSTICK
Bilirubin, UA: NEGATIVE
Glucose, UA: NEGATIVE
Ketones, UA: NEGATIVE
Leukocytes, UA: NEGATIVE
Nitrite, UA: NEGATIVE
Protein, UA: NEGATIVE
Spec Grav, UA: 1.025
Urobilinogen, UA: 0.2
pH, UA: 5.5

## 2014-11-04 MED ORDER — CIPROFLOXACIN HCL 500 MG PO TABS: 500.0000 mg | ORAL_TABLET | Freq: Two times a day (BID) | ORAL | Status: DC

## 2014-11-04 NOTE — Patient Instructions (Addendum)
Start Cipro 500mg  twice daily. We will send urine for culture. Follow up with urology as scheduled. Call immediately if symptoms are not improving.  Epididymitis Epididymitis is a swelling (inflammation) of the epididymis. The epididymis is a cord-like structure along the back part of the testicle. Epididymitis is usually, but not always, caused by infection. This is usually a sudden problem beginning with chills, fever and pain behind the scrotum and in the testicle. There may be swelling and redness of the testicle. DIAGNOSIS  Physical examination will reveal a tender, swollen epididymis. Sometimes, cultures are obtained from the urine or from prostate secretions to help find out if there is an infection or if the cause is a different problem. Sometimes, blood work is performed to see if your white blood cell count is elevated and if a germ (bacterial) or viral infection is present. Using this knowledge, an appropriate medicine which kills germs (antibiotic) can be chosen by your caregiver. A viral infection causing epididymitis will most often go away (resolve) without treatment. HOME CARE INSTRUCTIONS   Hot sitz baths for 20 minutes, 4 times per day, may help relieve pain.  Only take over-the-counter or prescription medicines for pain, discomfort or fever as directed by your caregiver.  Take all medicines, including antibiotics, as directed. Take the antibiotics for the full prescribed length of time even if you are feeling better.  It is very important to keep all follow-up appointments. SEEK IMMEDIATE MEDICAL CARE IF:   You have a fever.  You have pain not relieved with medicines.  You have any worsening of your problems.  Your pain seems to come and go.  You develop pain, redness, and swelling in the scrotum and surrounding areas. MAKE SURE YOU:   Understand these instructions.  Will watch your condition.  Will get help right away if you are not doing well or get  worse. Document Released: 01/22/2000 Document Revised: 04/18/2011 Document Reviewed: 12/11/2008 [facility] Patient Information 2015 [place], [place]. This information is not intended to replace advice given to you by your health care provider. Make sure you discuss any questions you have with your health care provider.

## 2014-11-04 NOTE — Progress Notes (Signed)
Pre visit review using our clinic review tool, if applicable. No additional management support is needed unless otherwise documented below in the visit note. 

## 2014-11-04 NOTE — Assessment & Plan Note (Signed)
Symptoms and exam c/w viral URI. Continue supportive care, rest. Follow up prn.

## 2014-11-04 NOTE — Progress Notes (Signed)
Subjective:    Patient ID: Tyler Loop., male    DOB: [birth year], 59 y.o.   MRN: [id number]  HPI  59YO male presents for acute visit.  Cough/nasal congestion - Nasal congestion and non-productive cough x 8 days. Taking some MucinexD with minimal improvement. No fever, chills. Mild sore throat 1-2 days. Daughter was also sick.  Epididymitis - Recurrent episode. Pain over left testicle. No swelling. No pain with urination. No fever, chills. Seen by urology and started on Flomax. Some improvement with this. Looking into laser surgery for BPH.   Wt Readings from Last 3 Encounters:  11/04/14 198 lb 4 oz (89.926 kg)  10/15/14 198 lb 4.8 oz (89.948 kg)  08/27/14 197 lb 8 oz (89.585 kg)   BP Readings from Last 3 Encounters:  11/04/14 113/76  10/15/14 128/76  08/27/14 115/73    Past Medical History  Diagnosis Date  . H/O diverticulitis of colon   . GERD (gastroesophageal reflux disease)   . Hyperlipidemia   . Colon polyps   . Diverticulosis   . Bulging disc     C2/3, C3/4, C6/7  . Cervical disc herniation     C4/5 and C5/6  . Spinal stenosis in cervical region     cord abutment C4/5  . Bulge of lumbar disc without myelopathy     L2/3 through L5/6  . Levoscoliosis   . Hemorrhoid   . Hiatal hernia   . Scoliosis   . Benign prostatic hypertrophy with lower urinary tract symptoms (LUTS)   . Testicular pain, left   . Infection of the upper respiratory tract 12/29/2011  . Infection of the upper respiratory tract 12/29/2011  . BPH (benign prostatic hyperplasia) 11/19/2013  . Benign prostatic hyperplasia with urinary obstruction 08/23/2012  . 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 Routine general medical examination at a health care facility 08/31/2012  . Testicular pain, left    Family History  Problem Relation Age of Onset  . Hyperlipidemia Mother   . Cancer Mother     skin cancer?  . Heart disease Mother 74    MI - died in her sleep  . Hyperlipidemia  Father   . Heart disease Father    Past Surgical History  Procedure Laterality Date  . Ganglion cyst excision Right 1994    wrist & back  . Hernia repair Left 1994    abdominal repair with mesh  . Nasal septum surgery  2011    Dr. [name]    Social History   Social History  . Marital Status: Married    Spouse Name: [name]  . Number of Children: 1  . Years of Education: GED   Occupational History  . Disability     Herniated disk, spinal stenosis   Social History Main Topics  . Smoking status: Former Smoker -- 20 years    Types: Cigarettes    Quit date: 02/08/1991  . Smokeless tobacco: Never Used  . Alcohol Use: No  . Drug Use: No  . Sexual Activity:    Partners: Female   Other Topics Concern  . None   Social History Narrative   [name] was born and raised in [place], [place]. He quit school after the 8th grade because of financial hardship. He was working since age 59. He went back and got his GED. He moved to [place] in 2006. He lives at home with his wife of 23 years and their 50 year old daughter. Previously, [name] was  married to his first wife for 20 years and they divorced. They had no children.  [name] has been disabled 2/2 back problems.      Review of Systems  Constitutional: Positive for fatigue. Negative for fever, chills, activity change, appetite change and unexpected weight change.  HENT: Positive for congestion, rhinorrhea, sinus pressure, sneezing and sore throat. Negative for trouble swallowing.   Eyes: Negative for visual disturbance.  Respiratory: Positive for cough. Negative for chest tightness and shortness of breath.   Cardiovascular: Negative for chest pain, palpitations and leg swelling.  Gastrointestinal: Negative for abdominal pain, diarrhea, constipation and abdominal distention.  Genitourinary: Positive for frequency, difficulty urinating and testicular pain. Negative for dysuria, urgency, flank pain, discharge, scrotal swelling, genital sores  and penile pain.  Musculoskeletal: Negative for arthralgias and gait problem.  Skin: Negative for color change and rash.  Hematological: Negative for adenopathy.  Psychiatric/Behavioral: Negative for sleep disturbance and dysphoric mood. The patient is not nervous/anxious.        Objective:    BP 113/76 mmHg  Pulse 66  Temp(Src) 98.3 F (36.8 C) (Oral)  Ht 5\' 10"  (1.778 m)  Wt 198 lb 4 oz (89.926 kg)  BMI 28.45 kg/m2  SpO2 97% Physical Exam  Constitutional: He is oriented to person, place, and time. He appears well-developed and well-nourished. No distress.  HENT:  Head: Normocephalic and atraumatic.  Right Ear: External ear normal.  Left Ear: External ear normal.  Nose: Nose normal.  Mouth/Throat: Oropharynx is clear and moist. No oropharyngeal exudate.  Eyes: Conjunctivae and EOM are normal. Pupils are equal, round, and reactive to light. Right eye exhibits no discharge. Left eye exhibits no discharge. No scleral icterus.  Neck: Normal range of motion. Neck supple. No tracheal deviation present. No thyromegaly present.  Cardiovascular: Normal rate, regular rhythm and normal heart sounds.  Exam reveals no gallop and no friction rub.   No murmur heard. Pulmonary/Chest: Effort normal and breath sounds normal. No accessory muscle usage. No tachypnea. No respiratory distress. He has no decreased breath sounds. He has no wheezes. He has no rhonchi. He has no rales. He exhibits no tenderness.  Genitourinary:    Right testis shows no mass, no swelling and no tenderness. Right testis is descended. Cremasteric reflex is not absent on the right side. Left testis shows tenderness. Left testis shows no mass and no swelling.  Musculoskeletal: Normal range of motion. He exhibits no edema.  Lymphadenopathy:    He has no cervical adenopathy.  Neurological: He is alert and oriented to person, place, and time. No cranial nerve deficit. Coordination normal.  Skin: Skin is warm and dry. No rash  noted. He is not diaphoretic. No erythema. No pallor.  Psychiatric: He has a normal mood and affect. His behavior is normal. Judgment and thought content normal.          Assessment & Plan:   Problem List Items Addressed This Visit      Unprioritized   Epididymitis - Primary    Symptoms and exam c/w left epididymitis. Will start Cipro 500mg  po bid. Urine send for culture. Follow up with urology as scheduled and prn.      Relevant Orders   POCT Urinalysis Dipstick (Completed)   CULTURE, URINE COMPREHENSIVE   Viral URI with cough    Symptoms and exam c/w viral URI. Continue supportive care, rest. Follow up prn.          Return in about 4 weeks (around 12/02/2014) for Recheck.

## 2014-11-04 NOTE — Assessment & Plan Note (Signed)
Symptoms and exam c/w left epididymitis. Will start Cipro 500mg  po bid. Urine send for culture. Follow up with urology as scheduled and prn.

## 2014-11-06 LAB — CULTURE, URINE COMPREHENSIVE
Colony Count: NO GROWTH
Organism ID, Bacteria: NO GROWTH

## 2014-11-24 ENCOUNTER — Ambulatory Visit (INDEPENDENT_AMBULATORY_CARE_PROVIDER_SITE_OTHER): Payer: Medicare Other | Admitting: Obstetrics and Gynecology

## 2014-11-24 ENCOUNTER — Encounter: Payer: Self-pay | Admitting: Obstetrics and Gynecology

## 2014-11-24 VITALS — BP 124/78 | HR 73 | Resp 16 | Ht 70.0 in | Wt 199.5 lb

## 2014-11-24 DIAGNOSIS — N400 Enlarged prostate without lower urinary tract symptoms: Secondary | ICD-10-CM | POA: Diagnosis not present

## 2014-11-24 LAB — URINALYSIS, COMPLETE
Bilirubin, UA: NEGATIVE
Glucose, UA: NEGATIVE
Ketones, UA: NEGATIVE
Leukocytes, UA: NEGATIVE
Nitrite, UA: NEGATIVE
Protein, UA: NEGATIVE
RBC, UA: NEGATIVE
Specific Gravity, UA: <1.005 — ABNORMAL LOW (ref 1.005–1.030)
Urobilinogen, Ur: 0.2 mg/dL (ref 0.2–1.0)
pH, UA: 5 (ref 5.0–7.5)

## 2014-11-24 LAB — MICROSCOPIC EXAMINATION
Bacteria, UA: NONE SEEN
Epithelial Cells (non renal): NONE SEEN /hpf (ref 0–10)
RBC, UA: NONE SEEN /hpf (ref 0–?)
WBC, UA: NONE SEEN /hpf (ref 0–?)

## 2014-11-24 LAB — BLADDER SCAN AMB NON-IMAGING

## 2014-11-24 MED ORDER — FINASTERIDE 5 MG PO TABS: 5.0000 mg | ORAL_TABLET | Freq: Every day | ORAL | Status: DC

## 2014-11-24 NOTE — Progress Notes (Signed)
11/24/2014 9:53 AM   [name]. 1955/03/28 [id number]  Referring provider: [name], MD [address] [place], [place] [address]  Chief Complaint  Patient presents with  . Medication Management    Possible negative reaction to Tamsulosin.  . Benign Prostatic Hypertrophy  . Testicle Pain    HPI: Patient is a 59 year old male presenting today for follow-up for BPH with LUTS and recurrent scrotal pain.  1. BPH with LUTS- Patient presents today for recheck. He continues to complain of urinary frequency and nocturia. He states that he was taking his tamsulosin for approximately one week. He did not notice a significant improvement in his urinary symptoms and discontinued his medication when he became ill with a respiratory infection. He states that he had some visual changes and attributed them to his tamsulosin. He stopped the medication at that time.  2. Scrotal pain- Patient has a long history of intermittent left-sided testicular/scrotal pain. A small mass was palpated on a previous exam by myself. He underwent a scrotal ultrasound and a 3 mm left epididymal cyst was noted. Patient states he recently went to his primary care provider with complaints of left testicular pain and she prescribed him 2 weeks of Cipro for presumed epididymitis. He that his symptoms did not improve after taking the antibiotics  PMH: Past Medical History  Diagnosis Date  . H/O diverticulitis of colon   . GERD (gastroesophageal reflux disease)   . Hyperlipidemia   . Colon polyps   . Diverticulosis   . Bulging disc     C2/3, C3/4, C6/7  . Cervical disc herniation     C4/5 and C5/6  . Spinal stenosis in cervical region     cord abutment C4/5  . Bulge of lumbar disc without myelopathy     L2/3 through L5/6  . Levoscoliosis   . Hemorrhoid   . Hiatal hernia   . Scoliosis   . Benign prostatic hypertrophy with lower urinary tract symptoms (LUTS)   . Testicular pain, left     . Infection of the upper respiratory tract 12/29/2011  . Infection of the upper respiratory tract 12/29/2011  . BPH (benign prostatic hyperplasia) 11/19/2013  . Benign prostatic hyperplasia with urinary obstruction 08/23/2012  . 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 Routine general medical examination at a health care facility 08/31/2012  . Testicular pain, left     Surgical History: Past Surgical History  Procedure Laterality Date  . Ganglion cyst excision Right 1994    wrist & back  . Hernia repair Left 1994    abdominal repair with mesh  . Nasal septum surgery  2011    Dr. [name]     Home Medications:    Medication List       This list is accurate as of: 11/24/14  9:53 AM.  Always use your most recent med list.               atorvastatin 10 MG tablet  Commonly known as:  LIPITOR  Take 1 tablet (10 mg total) by mouth daily at 6 PM.     CIALIS 10 MG tablet  Generic drug:  tadalafil  TAKE 1 TABLET BY MOUTH AS NEEDED     cyclobenzaprine 10 MG tablet  Commonly known as:  FLEXERIL  TAKE 1 TABLET (10 MG TOTAL) BY MOUTH 3 (THREE) TIMES DAILY AS NEEDED FOR MUSCLE SPASMS.     escitalopram 5 MG tablet  Commonly known as:  LEXAPRO  Take 1 tablet (5 mg total) by mouth daily.     finasteride 5 MG tablet  Commonly known as:  PROSCAR  Take 1 tablet (5 mg total) by mouth daily.     meloxicam 15 MG tablet  Commonly known as:  MOBIC  TAKE 1 TABLET (15 MG TOTAL) BY MOUTH DAILY.     NASACORT ALLERGY 24HR 55 MCG/ACT Aero nasal inhaler  Generic drug:  triamcinolone  USE 2 SPRAYS IN EACH NOSTRIL DAILY     pantoprazole 40 MG tablet  Commonly known as:  PROTONIX  Take 1 tablet (40 mg total) by mouth daily.        Allergies:  Allergies  Allergen Reactions  . Other Other (See Comments)    Pollen:  Sinus infection, cough, fatigue    Family History: Family History  Problem Relation Age of Onset  . Hyperlipidemia Mother   . Cancer Mother     skin  cancer?  . Heart disease Mother 64    MI - died in her sleep  . Hyperlipidemia Father   . Heart disease Father     Social History:  reports that he quit smoking about 23 years ago. His smoking use included Cigarettes. He quit after 20 years of use. He has never used smokeless tobacco. He reports that he does not drink alcohol or use illicit drugs.  ROS: UROLOGY Frequent Urination?: Yes Hard to postpone urination?: Yes Burning/pain with urination?: No Get up at night to urinate?: Yes Leakage of urine?: No Urine stream starts and stops?: Yes Trouble starting stream?: Yes Do you have to strain to urinate?: No Blood in urine?: No Urinary tract infection?: No Sexually transmitted disease?: No Injury to kidneys or bladder?: No Painful intercourse?: No Weak stream?: No Erection problems?: No Penile pain?: No  Gastrointestinal Nausea?: No Vomiting?: No Indigestion/heartburn?: No Diarrhea?: No Constipation?: Yes  Constitutional Fever: No Night sweats?: No Weight loss?: No Fatigue?: No  Skin Skin rash/lesions?: No Itching?: No  Eyes Blurred vision?: No Double vision?: No  Ears/Nose/Throat Sore throat?: No Sinus problems?: No  Hematologic/Lymphatic Swollen glands?: No Easy bruising?: No  Cardiovascular Leg swelling?: No Chest pain?: No  Respiratory Cough?: Yes Shortness of breath?: No  Endocrine Excessive thirst?: No  Musculoskeletal Back pain?: Yes Joint pain?: No  Neurological Headaches?: No Dizziness?: No  Psychologic Depression?: No Anxiety?: Yes  Physical Exam: BP 124/78 mmHg  Pulse 73  Resp 16  Ht 5\' 10"  (1.778 m)  Wt 199 lb 8 oz (90.493 kg)  BMI 28.63 kg/m2  Constitutional:  Alert and oriented, No acute distress. HEENT: [place] AT, moist mucus membranes.  Trachea midline, no masses. Cardiovascular: No clubbing, cyanosis, or edema. Respiratory: Normal respiratory effort, no increased work of breathing. GI: Abdomen is soft, nontender,  nondistended, no abdominal masses GU: No CVA tenderness, normal scrotum, testicles descended bilaterally, mild tenderness noted to area of previously identified left epididymal cyst Skin: No rashes, bruises or suspicious lesions. Lymph: No cervical or inguinal adenopathy. Neurologic: Grossly intact, no focal deficits, moving all 4 extremities. Psychiatric: Normal mood and affect.  Laboratory Data: Lab Results  Component Value Date   WBC 5.3 06/13/2014   HGB 14.8 06/13/2014   HCT 43.5 06/13/2014   MCV 87.3 06/13/2014   PLT 200.0 06/13/2014    Lab Results  Component Value Date   CREATININE 1.01 06/13/2014    Lab Results  Component Value Date   PSA 3.49 11/19/2013    No results found for: TESTOSTERONE  No results found for: HGBA1C  Urinalysis    Component Value Date/Time   GLUCOSEU Negative 10/15/2014 0857   BILIRUBINUR neg 11/04/2014 0957   BILIRUBINUR Negative 10/15/2014 0857   PROTEINUR neg 11/04/2014 0957   UROBILINOGEN 0.2 11/04/2014 0957   NITRITE neg 11/04/2014 0957   NITRITE Negative 10/15/2014 0857   LEUKOCYTESUR Negative 11/04/2014 0957   LEUKOCYTESUR Negative 10/15/2014 0857    Pertinent Imaging:   Assessment & Plan:    1. BPH (benign prostatic hyperplasia)-  PSA checked today. Patient to start Finasteride.  F/u 3 months Uroflow/PVR (previous Uroflow insufficient volume) with MD for recheck and to discuss surgical options if symptoms not improved.  PSA checked today. - Urinalysis, Complete - BLADDER SCAN AMB NON-IMAGING  2. Left Epididymal Cyst-  patient reports intermittent scrotal pain at the location of his previously identified 34mm left epididymal cyst seen on scrotal ultrasound 12/2013. He reports no improvement with the Cipro prescribed by his primary care provider. I explained to patient that his scrotal pain was not likely due to infection but possible inflammation related to his epididymal cyst. I suggested that he take NSAIDs as needed for  recurrence of discomfort.  Return for F/u 3 months Uroflow/PVR recheck with MD to discuss possible prostate surgery.  Herbert Moors, [facility] [address], [place], [place] [address] [phone]

## 2014-11-25 LAB — PSA: Prostate Specific Ag, Serum: 3.6 ng/mL (ref 0.0–4.0)

## 2014-11-27 ENCOUNTER — Telehealth: Payer: Self-pay

## 2014-11-27 NOTE — Telephone Encounter (Signed)
-----   Message from Roda Shutters, [place] sent at 11/27/2014  8:14 AM EDT ----- Please notify patient that his PSA is stable at 3.6. He needs to follow up as scheduled.

## 2014-11-27 NOTE — Telephone Encounter (Signed)
Spoke with pt in reference to lab results. Pt voiced understanding.  

## 2014-11-28 DIAGNOSIS — M5431 Sciatica, right side: Secondary | ICD-10-CM | POA: Diagnosis not present

## 2014-11-28 DIAGNOSIS — Z72820 Sleep deprivation: Secondary | ICD-10-CM | POA: Diagnosis not present

## 2014-11-28 DIAGNOSIS — M6240 Contracture of muscle, unspecified site: Secondary | ICD-10-CM | POA: Diagnosis not present

## 2014-11-28 DIAGNOSIS — M461 Sacroiliitis, not elsewhere classified: Secondary | ICD-10-CM | POA: Diagnosis not present

## 2014-11-28 DIAGNOSIS — M5020 Other cervical disc displacement, unspecified cervical region: Secondary | ICD-10-CM | POA: Diagnosis not present

## 2014-11-28 DIAGNOSIS — M9901 Segmental and somatic dysfunction of cervical region: Secondary | ICD-10-CM | POA: Diagnosis not present

## 2014-11-28 DIAGNOSIS — M9905 Segmental and somatic dysfunction of pelvic region: Secondary | ICD-10-CM | POA: Diagnosis not present

## 2014-11-28 DIAGNOSIS — M9903 Segmental and somatic dysfunction of lumbar region: Secondary | ICD-10-CM | POA: Diagnosis not present

## 2014-11-28 DIAGNOSIS — M9902 Segmental and somatic dysfunction of thoracic region: Secondary | ICD-10-CM | POA: Diagnosis not present

## 2014-11-28 DIAGNOSIS — M791 Myalgia: Secondary | ICD-10-CM | POA: Diagnosis not present

## 2014-12-01 DIAGNOSIS — M6240 Contracture of muscle, unspecified site: Secondary | ICD-10-CM | POA: Diagnosis not present

## 2014-12-01 DIAGNOSIS — M5020 Other cervical disc displacement, unspecified cervical region: Secondary | ICD-10-CM | POA: Diagnosis not present

## 2014-12-01 DIAGNOSIS — M791 Myalgia: Secondary | ICD-10-CM | POA: Diagnosis not present

## 2014-12-01 DIAGNOSIS — Z72820 Sleep deprivation: Secondary | ICD-10-CM | POA: Diagnosis not present

## 2014-12-01 DIAGNOSIS — M461 Sacroiliitis, not elsewhere classified: Secondary | ICD-10-CM | POA: Diagnosis not present

## 2014-12-01 DIAGNOSIS — M9903 Segmental and somatic dysfunction of lumbar region: Secondary | ICD-10-CM | POA: Diagnosis not present

## 2014-12-01 DIAGNOSIS — M9905 Segmental and somatic dysfunction of pelvic region: Secondary | ICD-10-CM | POA: Diagnosis not present

## 2014-12-01 DIAGNOSIS — M9902 Segmental and somatic dysfunction of thoracic region: Secondary | ICD-10-CM | POA: Diagnosis not present

## 2014-12-01 DIAGNOSIS — M5431 Sciatica, right side: Secondary | ICD-10-CM | POA: Diagnosis not present

## 2014-12-01 DIAGNOSIS — M9901 Segmental and somatic dysfunction of cervical region: Secondary | ICD-10-CM | POA: Diagnosis not present

## 2014-12-05 ENCOUNTER — Other Ambulatory Visit: Payer: Self-pay | Admitting: Internal Medicine

## 2014-12-06 ENCOUNTER — Other Ambulatory Visit: Payer: Self-pay | Admitting: Nurse Practitioner

## 2014-12-08 DIAGNOSIS — M9905 Segmental and somatic dysfunction of pelvic region: Secondary | ICD-10-CM | POA: Diagnosis not present

## 2014-12-08 DIAGNOSIS — M9902 Segmental and somatic dysfunction of thoracic region: Secondary | ICD-10-CM | POA: Diagnosis not present

## 2014-12-08 DIAGNOSIS — Z72820 Sleep deprivation: Secondary | ICD-10-CM | POA: Diagnosis not present

## 2014-12-08 DIAGNOSIS — M9903 Segmental and somatic dysfunction of lumbar region: Secondary | ICD-10-CM | POA: Diagnosis not present

## 2014-12-08 DIAGNOSIS — M791 Myalgia: Secondary | ICD-10-CM | POA: Diagnosis not present

## 2014-12-08 DIAGNOSIS — M461 Sacroiliitis, not elsewhere classified: Secondary | ICD-10-CM | POA: Diagnosis not present

## 2014-12-08 DIAGNOSIS — M5020 Other cervical disc displacement, unspecified cervical region: Secondary | ICD-10-CM | POA: Diagnosis not present

## 2014-12-08 DIAGNOSIS — M9901 Segmental and somatic dysfunction of cervical region: Secondary | ICD-10-CM | POA: Diagnosis not present

## 2014-12-08 DIAGNOSIS — M5431 Sciatica, right side: Secondary | ICD-10-CM | POA: Diagnosis not present

## 2014-12-08 DIAGNOSIS — M6240 Contracture of muscle, unspecified site: Secondary | ICD-10-CM | POA: Diagnosis not present

## 2014-12-11 ENCOUNTER — Other Ambulatory Visit: Payer: Self-pay | Admitting: *Deleted

## 2014-12-11 MED ORDER — ATORVASTATIN CALCIUM 10 MG PO TABS: ORAL_TABLET | ORAL | Status: DC

## 2014-12-12 ENCOUNTER — Ambulatory Visit: Payer: BLUE CROSS/BLUE SHIELD | Admitting: Internal Medicine

## 2014-12-16 ENCOUNTER — Encounter: Payer: Self-pay | Admitting: Internal Medicine

## 2014-12-16 ENCOUNTER — Ambulatory Visit (INDEPENDENT_AMBULATORY_CARE_PROVIDER_SITE_OTHER): Payer: Medicare Other | Admitting: Internal Medicine

## 2014-12-16 VITALS — BP 123/82 | HR 78 | Temp 97.8°F | Ht 70.0 in | Wt 198.5 lb

## 2014-12-16 DIAGNOSIS — N400 Enlarged prostate without lower urinary tract symptoms: Secondary | ICD-10-CM

## 2014-12-16 DIAGNOSIS — M722 Plantar fascial fibromatosis: Secondary | ICD-10-CM | POA: Diagnosis not present

## 2014-12-16 DIAGNOSIS — F411 Generalized anxiety disorder: Secondary | ICD-10-CM

## 2014-12-16 NOTE — Progress Notes (Signed)
Pre visit review using our clinic review tool, if applicable. No additional management support is needed unless otherwise documented below in the visit note. 

## 2014-12-16 NOTE — Progress Notes (Signed)
Subjective:    Patient ID: Tyler Loop., male    DOB: Mar 25, 1955, 59 y.o.   MRN: [id number]  HPI  59YO male presents for follow up.  Last seen 9/27 for Epididymitis.  Seen by Urology, found to have testicular cyst. Sometimes uncomfortable. Takes ibuprofen for this. Also started on Finasteride for enlarged prostate. No improvement noted with this medication as of yet. Typically waking 5-6 times per night to urinate.  Foot pain - Having some pain in the arch of both feet. Taking Meloxicam for this at times.  Anxiety - Symptoms improved with Lexapro 10mg  daily.   Wt Readings from Last 3 Encounters:  12/16/14 198 lb 8 oz (90.039 kg)  11/24/14 199 lb 8 oz (90.493 kg)  11/04/14 198 lb 4 oz (89.926 kg)   BP Readings from Last 3 Encounters:  12/16/14 123/82  11/24/14 124/78  11/04/14 113/76    Past Medical History  Diagnosis Date  . H/O diverticulitis of colon   . GERD (gastroesophageal reflux disease)   . Hyperlipidemia   . Colon polyps   . Diverticulosis   . Bulging disc     C2/3, C3/4, C6/7  . Cervical disc herniation     C4/5 and C5/6  . Spinal stenosis in cervical region     cord abutment C4/5  . Bulge of lumbar disc without myelopathy     L2/3 through L5/6  . Levoscoliosis   . Hemorrhoid   . Hiatal hernia   . Scoliosis   . Benign prostatic hypertrophy with lower urinary tract symptoms (LUTS)   . Testicular pain, left   . Infection of the upper respiratory tract 12/29/2011  . Infection of the upper respiratory tract 12/29/2011  . BPH (benign prostatic hyperplasia) 11/19/2013  . Benign prostatic hyperplasia with urinary obstruction 08/23/2012  . 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 Routine general medical examination at a health care facility 08/31/2012  . Testicular pain, left    Family History  Problem Relation Age of Onset  . Hyperlipidemia Mother   . Cancer Mother     skin cancer?  . Heart disease Mother 58    MI - died in her  sleep  . Hyperlipidemia Father   . Heart disease Father    Past Surgical History  Procedure Laterality Date  . Ganglion cyst excision Right 1994    wrist & back  . Hernia repair Left 1994    abdominal repair with mesh  . Nasal septum surgery  2011    Dr. [name]    Social History   Social History  . Marital Status: Married    Spouse Name: [name]  . Number of Children: 1  . Years of Education: GED   Occupational History  . Disability     Herniated disk, spinal stenosis   Social History Main Topics  . Smoking status: Former Smoker -- 20 years    Types: Cigarettes    Quit date: 02/08/1991  . Smokeless tobacco: Never Used  . Alcohol Use: No  . Drug Use: No  . Sexual Activity:    Partners: Female   Other Topics Concern  . None   Social History Narrative   [name] was born and raised in [place], [place]. He quit school after the 8th grade because of financial hardship. He was working since age 58. He went back and got his GED. He moved to [place] in 2006. He lives at home with his wife of 12 years and  their 45 year old daughter. Previously, [name] was married to his first wife for 20 years and they divorced. They had no children.  [name] has been disabled 2/2 back problems.      Review of Systems  Constitutional: Negative for fever, chills, activity change, appetite change, fatigue and unexpected weight change.  Eyes: Negative for visual disturbance.  Respiratory: Negative for cough and shortness of breath.   Cardiovascular: Negative for chest pain, palpitations and leg swelling.  Gastrointestinal: Negative for nausea, vomiting, abdominal pain, diarrhea, constipation and abdominal distention.  Genitourinary: Positive for frequency. Negative for dysuria, urgency, decreased urine volume and difficulty urinating.  Musculoskeletal: Positive for myalgias, back pain, arthralgias and neck pain. Negative for gait problem.  Skin: Negative for color change and rash.  Hematological:  Negative for adenopathy.  Psychiatric/Behavioral: Negative for sleep disturbance and dysphoric mood. The patient is not nervous/anxious.        Objective:    BP 123/82 mmHg  Pulse 78  Temp(Src) 97.8 F (36.6 C) (Oral)  Ht 5\' 10"  (1.778 m)  Wt 198 lb 8 oz (90.039 kg)  BMI 28.48 kg/m2  SpO2 95% Physical Exam  Constitutional: He is oriented to person, place, and time. He appears well-developed and well-nourished. No distress.  HENT:  Head: Normocephalic and atraumatic.  Right Ear: External ear normal.  Left Ear: External ear normal.  Nose: Nose normal.  Mouth/Throat: Oropharynx is clear and moist. No oropharyngeal exudate.  Eyes: Conjunctivae and EOM are normal. Pupils are equal, round, and reactive to light. Right eye exhibits no discharge. Left eye exhibits no discharge. No scleral icterus.  Neck: Normal range of motion. Neck supple. No tracheal deviation present. No thyromegaly present.  Cardiovascular: Normal rate, regular rhythm and normal heart sounds.  Exam reveals no gallop and no friction rub.   No murmur heard. Pulmonary/Chest: Effort normal and breath sounds normal. No accessory muscle usage. No tachypnea. No respiratory distress. He has no decreased breath sounds. He has no wheezes. He has no rhonchi. He has no rales. He exhibits no tenderness.  Musculoskeletal: Normal range of motion. He exhibits no edema.  Lymphadenopathy:    He has no cervical adenopathy.  Neurological: He is alert and oriented to person, place, and time. No cranial nerve deficit. Coordination normal.  Skin: Skin is warm and dry. No rash noted. He is not diaphoretic. No erythema. No pallor.  Psychiatric: His speech is normal and behavior is normal. Judgment and thought content normal. His mood appears anxious.          Assessment & Plan:   Problem List Items Addressed This Visit      Unprioritized   BPH (benign prostatic hyperplasia) - Primary    Reviewed notes from Urology. Will continue  Finasteride and follow up with Urology as scheduled.      Generalized anxiety disorder    Anxiety symptoms improved with Lexapro 10mg  daily. Will continue.      Plantar fasciitis of left foot    Encouraged him to consider podiatry evaluation for insoles, as he has had minimal improvement with NSAIDS and supportive care.          Return in about 6 months (around 06/15/2015) for Physical.

## 2014-12-16 NOTE — Assessment & Plan Note (Signed)
Anxiety symptoms improved with Lexapro 10mg  daily. Will continue.

## 2014-12-16 NOTE — Patient Instructions (Addendum)
Let us know if you would like to see Podiatry for foot pain.   Continue current medications.  Follow up in 6 months and sooner as needed.

## 2014-12-16 NOTE — Assessment & Plan Note (Signed)
Reviewed notes from Urology. Will continue Finasteride and follow up with Urology as scheduled.

## 2014-12-16 NOTE — Assessment & Plan Note (Signed)
Encouraged him to consider podiatry evaluation for insoles, as he has had minimal improvement with NSAIDS and supportive care.

## 2014-12-18 ENCOUNTER — Other Ambulatory Visit: Payer: Self-pay | Admitting: Nurse Practitioner

## 2014-12-19 DIAGNOSIS — M9903 Segmental and somatic dysfunction of lumbar region: Secondary | ICD-10-CM | POA: Diagnosis not present

## 2014-12-19 DIAGNOSIS — M9902 Segmental and somatic dysfunction of thoracic region: Secondary | ICD-10-CM | POA: Diagnosis not present

## 2014-12-19 DIAGNOSIS — Z72820 Sleep deprivation: Secondary | ICD-10-CM | POA: Diagnosis not present

## 2014-12-19 DIAGNOSIS — M9905 Segmental and somatic dysfunction of pelvic region: Secondary | ICD-10-CM | POA: Diagnosis not present

## 2014-12-19 DIAGNOSIS — M9901 Segmental and somatic dysfunction of cervical region: Secondary | ICD-10-CM | POA: Diagnosis not present

## 2014-12-19 DIAGNOSIS — M461 Sacroiliitis, not elsewhere classified: Secondary | ICD-10-CM | POA: Diagnosis not present

## 2014-12-19 DIAGNOSIS — M5431 Sciatica, right side: Secondary | ICD-10-CM | POA: Diagnosis not present

## 2014-12-19 DIAGNOSIS — M5020 Other cervical disc displacement, unspecified cervical region: Secondary | ICD-10-CM | POA: Diagnosis not present

## 2014-12-19 DIAGNOSIS — M6240 Contracture of muscle, unspecified site: Secondary | ICD-10-CM | POA: Diagnosis not present

## 2014-12-19 DIAGNOSIS — M791 Myalgia: Secondary | ICD-10-CM | POA: Diagnosis not present

## 2014-12-26 DIAGNOSIS — M6240 Contracture of muscle, unspecified site: Secondary | ICD-10-CM | POA: Diagnosis not present

## 2014-12-26 DIAGNOSIS — M9902 Segmental and somatic dysfunction of thoracic region: Secondary | ICD-10-CM | POA: Diagnosis not present

## 2014-12-26 DIAGNOSIS — M461 Sacroiliitis, not elsewhere classified: Secondary | ICD-10-CM | POA: Diagnosis not present

## 2014-12-26 DIAGNOSIS — M5020 Other cervical disc displacement, unspecified cervical region: Secondary | ICD-10-CM | POA: Diagnosis not present

## 2014-12-26 DIAGNOSIS — M5431 Sciatica, right side: Secondary | ICD-10-CM | POA: Diagnosis not present

## 2014-12-26 DIAGNOSIS — M791 Myalgia: Secondary | ICD-10-CM | POA: Diagnosis not present

## 2014-12-26 DIAGNOSIS — Z72820 Sleep deprivation: Secondary | ICD-10-CM | POA: Diagnosis not present

## 2014-12-26 DIAGNOSIS — M9905 Segmental and somatic dysfunction of pelvic region: Secondary | ICD-10-CM | POA: Diagnosis not present

## 2014-12-26 DIAGNOSIS — M9903 Segmental and somatic dysfunction of lumbar region: Secondary | ICD-10-CM | POA: Diagnosis not present

## 2014-12-26 DIAGNOSIS — M9901 Segmental and somatic dysfunction of cervical region: Secondary | ICD-10-CM | POA: Diagnosis not present

## 2015-01-03 ENCOUNTER — Other Ambulatory Visit: Payer: Self-pay | Admitting: Internal Medicine

## 2015-01-05 ENCOUNTER — Telehealth: Payer: Self-pay

## 2015-01-05 DIAGNOSIS — M9901 Segmental and somatic dysfunction of cervical region: Secondary | ICD-10-CM | POA: Diagnosis not present

## 2015-01-05 DIAGNOSIS — Z72820 Sleep deprivation: Secondary | ICD-10-CM | POA: Diagnosis not present

## 2015-01-05 DIAGNOSIS — M9903 Segmental and somatic dysfunction of lumbar region: Secondary | ICD-10-CM | POA: Diagnosis not present

## 2015-01-05 DIAGNOSIS — M791 Myalgia: Secondary | ICD-10-CM | POA: Diagnosis not present

## 2015-01-05 DIAGNOSIS — M9905 Segmental and somatic dysfunction of pelvic region: Secondary | ICD-10-CM | POA: Diagnosis not present

## 2015-01-05 DIAGNOSIS — M5020 Other cervical disc displacement, unspecified cervical region: Secondary | ICD-10-CM | POA: Diagnosis not present

## 2015-01-05 DIAGNOSIS — M461 Sacroiliitis, not elsewhere classified: Secondary | ICD-10-CM | POA: Diagnosis not present

## 2015-01-05 DIAGNOSIS — M5431 Sciatica, right side: Secondary | ICD-10-CM | POA: Diagnosis not present

## 2015-01-05 DIAGNOSIS — M9902 Segmental and somatic dysfunction of thoracic region: Secondary | ICD-10-CM | POA: Diagnosis not present

## 2015-01-05 DIAGNOSIS — M6240 Contracture of muscle, unspecified site: Secondary | ICD-10-CM | POA: Diagnosis not present

## 2015-01-05 NOTE — Telephone Encounter (Signed)
Refill on cialis 

## 2015-01-05 NOTE — Telephone Encounter (Signed)
PA started for Cialis on Cover my meds

## 2015-01-07 ENCOUNTER — Telehealth: Payer: Self-pay | Admitting: Internal Medicine

## 2015-01-07 ENCOUNTER — Other Ambulatory Visit: Payer: Self-pay

## 2015-01-07 MED ORDER — ESCITALOPRAM OXALATE 10 MG PO TABS: 10.0000 mg | ORAL_TABLET | Freq: Every day | ORAL | Status: DC

## 2015-01-07 NOTE — Telephone Encounter (Signed)
Pt came in and dropped off disability forms to be filled out.. Placed in Dr. Derry Skill box. Please advise pt when ready

## 2015-01-09 DIAGNOSIS — Z72820 Sleep deprivation: Secondary | ICD-10-CM | POA: Diagnosis not present

## 2015-01-09 DIAGNOSIS — M791 Myalgia: Secondary | ICD-10-CM | POA: Diagnosis not present

## 2015-01-09 DIAGNOSIS — M461 Sacroiliitis, not elsewhere classified: Secondary | ICD-10-CM | POA: Diagnosis not present

## 2015-01-09 DIAGNOSIS — M5431 Sciatica, right side: Secondary | ICD-10-CM | POA: Diagnosis not present

## 2015-01-09 DIAGNOSIS — M9901 Segmental and somatic dysfunction of cervical region: Secondary | ICD-10-CM | POA: Diagnosis not present

## 2015-01-09 DIAGNOSIS — M5020 Other cervical disc displacement, unspecified cervical region: Secondary | ICD-10-CM | POA: Diagnosis not present

## 2015-01-09 DIAGNOSIS — M9902 Segmental and somatic dysfunction of thoracic region: Secondary | ICD-10-CM | POA: Diagnosis not present

## 2015-01-09 DIAGNOSIS — M9903 Segmental and somatic dysfunction of lumbar region: Secondary | ICD-10-CM | POA: Diagnosis not present

## 2015-01-09 DIAGNOSIS — M6240 Contracture of muscle, unspecified site: Secondary | ICD-10-CM | POA: Diagnosis not present

## 2015-01-09 DIAGNOSIS — M9905 Segmental and somatic dysfunction of pelvic region: Secondary | ICD-10-CM | POA: Diagnosis not present

## 2015-01-12 NOTE — Telephone Encounter (Signed)
Pt picked up forms to take to another physician

## 2015-01-14 ENCOUNTER — Ambulatory Visit: Payer: Medicare Other | Admitting: Obstetrics and Gynecology

## 2015-02-02 ENCOUNTER — Other Ambulatory Visit: Payer: Self-pay | Admitting: Internal Medicine

## 2015-02-24 ENCOUNTER — Encounter: Payer: Self-pay | Admitting: Urology

## 2015-02-24 ENCOUNTER — Other Ambulatory Visit: Payer: Self-pay

## 2015-02-24 ENCOUNTER — Telehealth: Payer: Self-pay | Admitting: Internal Medicine

## 2015-02-24 ENCOUNTER — Ambulatory Visit (INDEPENDENT_AMBULATORY_CARE_PROVIDER_SITE_OTHER): Payer: Medicare Other | Admitting: Urology

## 2015-02-24 VITALS — BP 143/90 | HR 65 | Ht 70.0 in | Wt 200.7 lb

## 2015-02-24 DIAGNOSIS — N401 Enlarged prostate with lower urinary tract symptoms: Secondary | ICD-10-CM | POA: Diagnosis not present

## 2015-02-24 DIAGNOSIS — N138 Other obstructive and reflux uropathy: Secondary | ICD-10-CM

## 2015-02-24 LAB — PR COMPLEX UROFLOWMETRY: Scan Result: 90

## 2015-02-24 MED ORDER — MELOXICAM 15 MG PO TABS: ORAL_TABLET | ORAL | Status: DC

## 2015-02-24 MED ORDER — CYCLOBENZAPRINE HCL 10 MG PO TABS: ORAL_TABLET | ORAL | Status: DC

## 2015-02-24 MED ORDER — TAMSULOSIN HCL 0.4 MG PO CAPS: 0.4000 mg | ORAL_CAPSULE | Freq: Every day | ORAL | Status: DC

## 2015-02-24 NOTE — Progress Notes (Signed)
02/24/2015 9:00 AM   [name]. [birth year] [id number]  Referring provider: [name], MD [id number] [place], [place] [address]  Chief Complaint  Patient presents with  . Benign Prostatic Hypertrophy    24mth    HPI: [name] is a 60yo with a hx of BPH with nocturia, frequency here for followup. He is on cialis 5mg  daily. He was on finasteride but stopped it 1 week ago because he did not feel it was helping his LUTS.  He has frequency every hour to 90 minutes. He has been having urinary frequency for over 30 years. He has nocturia 3x.  He was previously on flomax but he does not recall any effect.  He has 11 bulging discs in his cervical, thoracic and lumbar spine Last PSA was 3.6  He has issues with constipation and he takes metamucil and senakot daily   PMH: Past Medical History  Diagnosis Date  . H/O diverticulitis of colon   . GERD (gastroesophageal reflux disease)   . Hyperlipidemia   . Colon polyps   . Diverticulosis   . Bulging disc     C2/3, C3/4, C6/7  . Cervical disc herniation     C4/5 and C5/6  . Spinal stenosis in cervical region     cord abutment C4/5  . Bulge of lumbar disc without myelopathy     L2/3 through L5/6  . Levoscoliosis   . Hemorrhoid   . Hiatal hernia   . Scoliosis   . Benign prostatic hypertrophy with lower urinary tract symptoms (LUTS)   . Testicular pain, left   . Infection of the upper respiratory tract 12/29/2011  . Infection of the upper respiratory tract 12/29/2011  . BPH (benign prostatic hyperplasia) 11/19/2013  . Benign prostatic hyperplasia with urinary obstruction 08/23/2012  . 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 Routine general medical examination at a health care facility 08/31/2012  . Testicular pain, left     Surgical History: Past Surgical History  Procedure Laterality Date  . Ganglion cyst excision Right 1994    wrist & back  . Hernia repair Left 1994   abdominal repair with mesh  . Nasal septum surgery  2011    Dr. [name]     Home Medications:    Medication List       This list is accurate as of: 02/24/15  9:00 AM.  Always use your most recent med list.               atorvastatin 10 MG tablet  Commonly known as:  LIPITOR  TAKE 1 TABLET (10 MG TOTAL) BY MOUTH DAILY AT 6 PM.     CIALIS 10 MG tablet  Generic drug:  tadalafil  TAKE 1 TABLET BY MOUTH AS NEEDED     cyclobenzaprine 10 MG tablet  Commonly known as:  FLEXERIL  TAKE 1 TABLET (10 MG TOTAL) BY MOUTH 3 (THREE) TIMES DAILY AS NEEDED FOR MUSCLE SPASMS.     cyclobenzaprine 10 MG tablet  Commonly known as:  FLEXERIL  TAKE 1 TABLET (10 MG TOTAL) BY MOUTH 3 (THREE) TIMES DAILY AS NEEDED FOR MUSCLE SPASMS.     escitalopram 10 MG tablet  Commonly known as:  LEXAPRO  Take 1 tablet (10 mg total) by mouth daily.     escitalopram 10 MG tablet  Commonly known as:  LEXAPRO  Take 1 tablet (10 mg total) by mouth daily.     finasteride 5 MG  tablet  Commonly known as:  PROSCAR  Take 1 tablet (5 mg total) by mouth daily.     meloxicam 15 MG tablet  Commonly known as:  MOBIC  TAKE 1 TABLET (15 MG TOTAL) BY MOUTH DAILY.     NASACORT ALLERGY 24HR 55 MCG/ACT Aero nasal inhaler  Generic drug:  triamcinolone  USE 2 SPRAYS IN EACH NOSTRIL DAILY     pantoprazole 40 MG tablet  Commonly known as:  PROTONIX  Take 1 tablet (40 mg total) by mouth daily.        Allergies:  Allergies  Allergen Reactions  . Other Other (See Comments)    Pollen:  Sinus infection, cough, fatigue    Family History: Family History  Problem Relation Age of Onset  . Hyperlipidemia Mother   . Cancer Mother     skin cancer?  . Heart disease Mother 19    MI - died in her sleep  . Hyperlipidemia Father   . Heart disease Father     Social History:  reports that he quit smoking about 24 years ago. His smoking use included Cigarettes. He quit after 20 years of use. He has never used smokeless  tobacco. He reports that he does not drink alcohol or use illicit drugs.  ROS: UROLOGY Frequent Urination?: Yes Hard to postpone urination?: Yes Burning/pain with urination?: No Get up at night to urinate?: Yes Leakage of urine?: No Urine stream starts and stops?: Yes Trouble starting stream?: Yes Do you have to strain to urinate?: No Blood in urine?: No Urinary tract infection?: No Sexually transmitted disease?: No Injury to kidneys or bladder?: No Painful intercourse?: No Weak stream?: Yes Erection problems?: No Penile pain?: No  Gastrointestinal Nausea?: No Vomiting?: No Indigestion/heartburn?: No Diarrhea?: No Constipation?: Yes  Constitutional Fever: No Night sweats?: No Weight loss?: No Fatigue?: No  Skin Skin rash/lesions?: No Itching?: No  Eyes Blurred vision?: No Double vision?: No  Ears/Nose/Throat Sore throat?: No Sinus problems?: No  Hematologic/Lymphatic Swollen glands?: No Easy bruising?: No  Cardiovascular Leg swelling?: No Chest pain?: No  Respiratory Cough?: Yes Shortness of breath?: No  Endocrine Excessive thirst?: No  Musculoskeletal Back pain?: Yes Joint pain?: No  Neurological Headaches?: No Dizziness?: No  Psychologic Depression?: No Anxiety?: Yes  Physical Exam: BP 143/90 mmHg  Pulse 65  Ht 5\' 10"  (1.778 m)  Wt 91.037 kg (200 lb 11.2 oz)  BMI 28.80 kg/m2  Constitutional:  Alert and oriented, No acute distress. HEENT: [place] AT, moist mucus membranes.  Trachea midline, no masses. Cardiovascular: No clubbing, cyanosis, or edema. Respiratory: Normal respiratory effort, no increased work of breathing. GI: Abdomen is soft, nontender, nondistended, no abdominal masses GU: No CVA tenderness.  Skin: No rashes, bruises or suspicious lesions. Lymph: No cervical or inguinal adenopathy. Neurologic: Grossly intact, no focal deficits, moving all 4 extremities. Psychiatric: Normal mood and affect.  Laboratory Data: Lab  Results  Component Value Date   WBC 5.3 06/13/2014   HGB 14.8 06/13/2014   HCT 43.5 06/13/2014   MCV 87.3 06/13/2014   PLT 200.0 06/13/2014    Lab Results  Component Value Date   CREATININE 1.01 06/13/2014    Lab Results  Component Value Date   PSA 3.49 11/19/2013    No results found for: TESTOSTERONE  No results found for: HGBA1C  Urinalysis    Component Value Date/Time   GLUCOSEU Negative 11/24/2014 0906   BILIRUBINUR Negative 11/24/2014 0906   BILIRUBINUR neg 11/04/2014 0957   PROTEINUR neg 11/04/2014  0957   UROBILINOGEN 0.2 11/04/2014 0957   NITRITE Negative 11/24/2014 0906   NITRITE neg 11/04/2014 0957   LEUKOCYTESUR Negative 11/24/2014 0906   LEUKOCYTESUR Negative 11/04/2014 0957    Pertinent Imaging: none  Assessment & Plan:    1. Benign prostatic hyperplasia with urinary obstruction -flomax and mirabegron 25mg  daily  -stop cialis in 1 week - PR COMPLEX UROFLOWMETRY; Standing - Bladder Scan (Post Void Residual) in office - PR COMPLEX UROFLOWMETRY  2. Constipation -miralax 17g daily    No Follow-up on file.  [name], [facility] [address], [place], [place] [address] [phone]

## 2015-02-24 NOTE — Telephone Encounter (Signed)
Pt last OV 12/16/14, last filled #[id number] 1refill on 07/24/14. Please advise/tvw

## 2015-02-24 NOTE — Telephone Encounter (Signed)
Pt called needing a refill for cyclobenzaprine (FLEXERIL) 10 MG tablet, meloxicam (MOBIC) 15 MG tablet. Pharmacy is [id number] - [facility], [place]. Call pt @ [phone]. Thank You!

## 2015-03-12 ENCOUNTER — Telehealth: Payer: Self-pay | Admitting: *Deleted

## 2015-03-18 ENCOUNTER — Ambulatory Visit (INDEPENDENT_AMBULATORY_CARE_PROVIDER_SITE_OTHER): Payer: Medicare Other | Admitting: Family Medicine

## 2015-03-18 ENCOUNTER — Encounter: Payer: Self-pay | Admitting: Family Medicine

## 2015-03-18 VITALS — BP 126/70 | HR 68 | Temp 98.0°F | Ht 70.0 in | Wt 203.1 lb

## 2015-03-18 DIAGNOSIS — K5900 Constipation, unspecified: Secondary | ICD-10-CM

## 2015-03-18 NOTE — Patient Instructions (Signed)
Increase the miralax to 2 or 3 times daily.    Continue the metamucil and sennakot.  Increase water intake.  Follow up as needed.  Take care  Dr. Lacinda Axon

## 2015-03-18 NOTE — Progress Notes (Signed)
Subjective:  Patient ID: [name]., male    DOB: [birth year]  Age: 60 y.o. MRN: [id number]  CC: Constipation  HPI:  60 year old male with a history of constipation presents with constipation.  Patient reports that for the past 2 weeks she's had difficulty with constipation. He reports associated abdominal cramping and left lower quadrant pain. He has been using Metamucil and Senokot recently. He did have a bowel movement this morning but states that it was not very large.  He does report a history of diverticulitis but feels that his current issues is from constipation. No associated fever or chills. No hematochezia or melena. Additionally, he was recently started on myrbetriq. He was encouraged to use MiraLAX by his urologist. No known exacerbating factors.  Social Hx   Social History   Social History  . Marital Status: Married    Spouse Name: [name]  . Number of Children: 1  . Years of Education: GED   Occupational History  . Disability     Herniated disk, spinal stenosis   Social History Main Topics  . Smoking status: Former Smoker -- 20 years    Types: Cigarettes    Quit date: 02/08/1991  . Smokeless tobacco: Never Used  . Alcohol Use: No  . Drug Use: No  . Sexual Activity:    Partners: Female   Other Topics Concern  . None   Social History Narrative   Iosefa was born and raised in [place], [place]. He quit school after the 8th grade because of financial hardship. He was working since age 63. He went back and got his GED. He moved to [place] in 2006. He lives at home with his wife of 33 years and their 51 year old daughter. Previously, Shaunak was married to his first wife for 20 years and they divorced. They had no children.  Keynon has been disabled 2/2 back problems.     Review of Systems  Constitutional: Negative.   Gastrointestinal: Positive for abdominal pain and constipation.   Objective:  BP 126/70 mmHg  Pulse 68  Temp(Src) 98 F (36.7 C) (Oral)  Ht  5\' 10"  (1.778 m)  Wt 203 lb 2 oz (92.137 kg)  BMI 29.15 kg/m2  SpO2 98%  BP/Weight 03/18/2015 02/24/2015 0000000  Systolic BP [id number]  Diastolic BP 70 90 82  Wt. (Lbs) 203.13 200.7 198.5  BMI 29.15 28.8 28.48   Physical Exam  Constitutional: He is oriented to person, place, and time. He appears well-developed. No distress.  Cardiovascular: Normal rate and regular rhythm.   Pulmonary/Chest: Effort normal and breath sounds normal.  Abdominal:  Soft, mildly distended. Mild tenderness palpation left lower quadrant. No rebound or guarding.  Neurological: He is alert and oriented to person, place, and time.  Psychiatric: He has a normal mood and affect.  Vitals reviewed.  Lab Results  Component Value Date   WBC 5.3 06/13/2014   HGB 14.8 06/13/2014   HCT 43.5 06/13/2014   PLT 200.0 06/13/2014   GLUCOSE 103* 06/13/2014   CHOL 163 06/13/2014   TRIG 133.0 06/13/2014   HDL 46.90 06/13/2014   LDLCALC 90 06/13/2014   ALT 13 06/13/2014   AST 18 06/13/2014   NA 140 06/13/2014   K 4.0 06/13/2014   CL 106 06/13/2014   CREATININE 1.01 06/13/2014   BUN 14 06/13/2014   CO2 26 06/13/2014   TSH 0.72 06/13/2014   PSA 3.49 11/19/2013   Assessment & Plan:   Problem List Items  Addressed This Visit    Constipation - Primary    Established problem, worsening. Advised increase in MiraLAX (2-3 times daily or more if needed). Also advised continue use of Metamucil and Senokot.         Meds ordered this encounter  Medications  . Mirabegron (MYRBETRIQ PO)    Sig: Take 25 mg by mouth daily.    Follow-up: PRN  [place]

## 2015-03-18 NOTE — Assessment & Plan Note (Signed)
Established problem, worsening. Advised increase in MiraLAX (2-3 times daily or more if needed). Also advised continue use of Metamucil and Senokot.

## 2015-03-18 NOTE — Progress Notes (Signed)
Pre visit review using our clinic review tool, if applicable. No additional management support is needed unless otherwise documented below in the visit note. 

## 2015-03-26 DIAGNOSIS — J019 Acute sinusitis, unspecified: Secondary | ICD-10-CM | POA: Diagnosis not present

## 2015-03-31 ENCOUNTER — Encounter: Payer: Self-pay | Admitting: Urology

## 2015-03-31 ENCOUNTER — Ambulatory Visit (INDEPENDENT_AMBULATORY_CARE_PROVIDER_SITE_OTHER): Payer: Medicare Other | Admitting: Urology

## 2015-03-31 VITALS — BP 131/76 | HR 65 | Ht 70.0 in | Wt 203.6 lb

## 2015-03-31 DIAGNOSIS — N400 Enlarged prostate without lower urinary tract symptoms: Secondary | ICD-10-CM | POA: Diagnosis not present

## 2015-03-31 DIAGNOSIS — R351 Nocturia: Secondary | ICD-10-CM | POA: Diagnosis not present

## 2015-03-31 DIAGNOSIS — N3281 Overactive bladder: Secondary | ICD-10-CM | POA: Insufficient documentation

## 2015-03-31 LAB — BLADDER SCAN AMB NON-IMAGING

## 2015-03-31 MED ORDER — LACTULOSE 20 G PO PACK: 20.0000 g | PACK | Freq: Every day | ORAL | Status: DC

## 2015-03-31 NOTE — Progress Notes (Signed)
03/31/2015 9:05 AM   [name]. 08/28/1955 [id number]  Referring provider: [name], MD [id number] [place], [place] [address]  Chief Complaint  Patient presents with  . Benign Prostatic Hypertrophy    69month    HPI: [name] is a 60yo here for followup for BPH with LUTS, nocturia and OAB. He is currently on flomax 0.4mg  and mirabegron 25mg  daily.  He ran out of the medication 1 week ago and noticed mild improvement. He continues to have urgency and frequency q 1 hour. Nocturia 3-4x. No urge incontinence. No hematuria, no dysuria He has a small bowel movement daily. He states his bowel movement are thin and small. He had a colonoscopy 3 years ago. He is taking miralax and metamucil daily.    PMH: Past Medical History  Diagnosis Date  . H/O diverticulitis of colon   . GERD (gastroesophageal reflux disease)   . Hyperlipidemia   . Colon polyps   . Diverticulosis   . Bulging disc     C2/3, C3/4, C6/7  . Cervical disc herniation     C4/5 and C5/6  . Spinal stenosis in cervical region     cord abutment C4/5  . Bulge of lumbar disc without myelopathy     L2/3 through L5/6  . Levoscoliosis   . Hemorrhoid   . Hiatal hernia   . Scoliosis   . Benign prostatic hypertrophy with lower urinary tract symptoms (LUTS)   . Testicular pain, left   . Infection of the upper respiratory tract 12/29/2011  . Infection of the upper respiratory tract 12/29/2011  . BPH (benign prostatic hyperplasia) 11/19/2013  . Benign prostatic hyperplasia with urinary obstruction 08/23/2012  . 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 Routine general medical examination at a health care facility 08/31/2012  . Testicular pain, left     Surgical History: Past Surgical History  Procedure Laterality Date  . Ganglion cyst excision Right 1994    wrist & back  . Hernia repair Left 1994    abdominal repair with mesh  . Nasal septum surgery  2011    Dr. [name]      Home Medications:    Medication List       This list is accurate as of: 03/31/15  9:05 AM.  Always use your most recent med list.               amoxicillin 875 MG tablet  Commonly known as:  AMOXIL  TK 1 T PO BID     atorvastatin 10 MG tablet  Commonly known as:  LIPITOR  TAKE 1 TABLET (10 MG TOTAL) BY MOUTH DAILY AT 6 PM.     CIALIS 10 MG tablet  Generic drug:  tadalafil  TAKE 1 TABLET BY MOUTH AS NEEDED     cyclobenzaprine 10 MG tablet  Commonly known as:  FLEXERIL  TAKE 1 TABLET (10 MG TOTAL) BY MOUTH 3 (THREE) TIMES DAILY AS NEEDED FOR MUSCLE SPASMS.     escitalopram 10 MG tablet  Commonly known as:  LEXAPRO  Take 1 tablet (10 mg total) by mouth daily.     escitalopram 10 MG tablet  Commonly known as:  LEXAPRO  Take 1 tablet (10 mg total) by mouth daily.     meloxicam 15 MG tablet  Commonly known as:  MOBIC  TAKE 1 TABLET (15 MG TOTAL) BY MOUTH DAILY.     MYRBETRIQ PO  Take 25 mg by mouth  daily.     NASACORT ALLERGY 24HR 55 MCG/ACT Aero nasal inhaler  Generic drug:  triamcinolone  USE 2 SPRAYS IN EACH NOSTRIL DAILY     pantoprazole 40 MG tablet  Commonly known as:  PROTONIX  Take 1 tablet (40 mg total) by mouth daily.     tamsulosin 0.4 MG Caps capsule  Commonly known as:  FLOMAX  Take 1 capsule (0.4 mg total) by mouth daily after supper.        Allergies:  Allergies  Allergen Reactions  . Other Other (See Comments)    Pollen:  Sinus infection, cough, fatigue    Family History: Family History  Problem Relation Age of Onset  . Hyperlipidemia Mother   . Cancer Mother     skin cancer?  . Heart disease Mother 48    MI - died in her sleep  . Hyperlipidemia Father   . Heart disease Father     Social History:  reports that he quit smoking about 24 years ago. His smoking use included Cigarettes. He quit after 20 years of use. He has never used smokeless tobacco. He reports that he does not drink alcohol or use illicit  drugs.  ROS: UROLOGY Frequent Urination?: Yes Hard to postpone urination?: Yes Burning/pain with urination?: No Get up at night to urinate?: Yes Leakage of urine?: No Urine stream starts and stops?: No Trouble starting stream?: No Do you have to strain to urinate?: No Blood in urine?: No Urinary tract infection?: No Sexually transmitted disease?: No Injury to kidneys or bladder?: No Painful intercourse?: No Weak stream?: No Erection problems?: No Penile pain?: No  Gastrointestinal Nausea?: No Vomiting?: No Indigestion/heartburn?: Yes Diarrhea?: No Constipation?: Yes  Constitutional Fever: No Night sweats?: No Weight loss?: No Fatigue?: No  Skin Skin rash/lesions?: No Itching?: No  Eyes Blurred vision?: No Double vision?: No  Ears/Nose/Throat Sore throat?: No Sinus problems?: Yes  Hematologic/Lymphatic Swollen glands?: No Easy bruising?: No  Cardiovascular Leg swelling?: No Chest pain?: No  Respiratory Cough?: Yes Shortness of breath?: No  Endocrine Excessive thirst?: No  Musculoskeletal Back pain?: Yes Joint pain?: No  Neurological Headaches?: No Dizziness?: No  Psychologic Depression?: No Anxiety?: Yes  Physical Exam: BP 131/76 mmHg  Pulse 65  Ht 5\' 10"  (1.778 m)  Wt 92.352 kg (203 lb 9.6 oz)  BMI 29.21 kg/m2  Constitutional:  Alert and oriented, No acute distress. HEENT: [place] AT, moist mucus membranes.  Trachea midline, no masses. Cardiovascular: No clubbing, cyanosis, or edema. Respiratory: Normal respiratory effort, no increased work of breathing. GI: Abdomen is soft, nontender, nondistended, no abdominal masses GU: No CVA tenderness. Skin: No rashes, bruises or suspicious lesions. Lymph: No cervical or inguinal adenopathy. Neurologic: Grossly intact, no focal deficits, moving all 4 extremities. Psychiatric: Normal mood and affect.  Laboratory Data: Lab Results  Component Value Date   WBC 5.3 06/13/2014   HGB 14.8  06/13/2014   HCT 43.5 06/13/2014   MCV 87.3 06/13/2014   PLT 200.0 06/13/2014    Lab Results  Component Value Date   CREATININE 1.01 06/13/2014    Lab Results  Component Value Date   PSA 3.49 11/19/2013    No results found for: TESTOSTERONE  No results found for: HGBA1C  Urinalysis    Component Value Date/Time   GLUCOSEU Negative 11/24/2014 0906   BILIRUBINUR Negative 11/24/2014 0906   BILIRUBINUR neg 11/04/2014 0957   PROTEINUR neg 11/04/2014 0957   UROBILINOGEN 0.2 11/04/2014 0957   NITRITE Negative 11/24/2014 0906  NITRITE neg 11/04/2014 0957   LEUKOCYTESUR Negative 11/24/2014 0906   LEUKOCYTESUR Negative 11/04/2014 0957    Pertinent Imaging: none  Assessment & Plan:   1. BPH (benign prostatic hyperplasia) -continue flomax 0.4mg  daily -increase mirabegron 50mg  daily - BLADDER SCAN AMB NON-IMAGING -RTC 1 month  2. Constipation -lactulose 20g daily for 30 days    No Follow-up on file.  Cleon Gustin, [facility] [address] [place], [place] [address] [phone]

## 2015-04-08 ENCOUNTER — Telehealth: Payer: Self-pay | Admitting: *Deleted

## 2015-04-08 MED ORDER — PANTOPRAZOLE SODIUM 40 MG PO TBEC: 40.0000 mg | DELAYED_RELEASE_TABLET | Freq: Every day | ORAL | Status: DC

## 2015-04-08 NOTE — Telephone Encounter (Signed)
Medication refill for Pantoprazole 40 mg 90 day supply [facility] [address].

## 2015-04-21 ENCOUNTER — Encounter: Payer: Self-pay | Admitting: Urology

## 2015-04-21 ENCOUNTER — Ambulatory Visit (INDEPENDENT_AMBULATORY_CARE_PROVIDER_SITE_OTHER): Payer: Medicare Other | Admitting: Urology

## 2015-04-21 VITALS — BP 124/85 | HR 71 | Ht 70.0 in | Wt 203.1 lb

## 2015-04-21 DIAGNOSIS — N3281 Overactive bladder: Secondary | ICD-10-CM

## 2015-04-21 DIAGNOSIS — N401 Enlarged prostate with lower urinary tract symptoms: Secondary | ICD-10-CM | POA: Diagnosis not present

## 2015-04-21 DIAGNOSIS — R351 Nocturia: Secondary | ICD-10-CM

## 2015-04-21 DIAGNOSIS — N138 Other obstructive and reflux uropathy: Secondary | ICD-10-CM

## 2015-04-21 LAB — URINALYSIS, COMPLETE
Bilirubin, UA: NEGATIVE
Glucose, UA: NEGATIVE
Ketones, UA: NEGATIVE
Leukocytes, UA: NEGATIVE
Nitrite, UA: NEGATIVE
Protein, UA: NEGATIVE
RBC, UA: NEGATIVE
Specific Gravity, UA: <1.005 — ABNORMAL LOW (ref 1.005–1.030)
Urobilinogen, Ur: 0.2 mg/dL (ref 0.2–1.0)
pH, UA: 5.5 (ref 5.0–7.5)

## 2015-04-21 LAB — MICROSCOPIC EXAMINATION
Bacteria, UA: NONE SEEN
Epithelial Cells (non renal): NONE SEEN /hpf (ref 0–10)
RBC, UA: NONE SEEN /hpf (ref 0–?)
WBC, UA: NONE SEEN /hpf (ref 0–?)

## 2015-04-21 LAB — BLADDER SCAN AMB NON-IMAGING: Scan Result: 100

## 2015-04-21 MED ORDER — MIRABEGRON ER 50 MG PO TB24: 50.0000 mg | ORAL_TABLET | Freq: Every day | ORAL | Status: DC

## 2015-04-21 NOTE — Progress Notes (Signed)
04/21/2015 9:43 AM   [name]. [birth year] [id number]  Referring provider: [name], MD [id number] [place], [place] [address]  Chief Complaint  Patient presents with  . Follow-up    BPH, overactive bladder     HPI: [name] is a 60yo seen in followup for BPH, OAB and constipation. He takes lactulose and 2 metamucils which has helped resolve his constipation. He is on cilais 5mg  daily and 50mg  mirabegron. It has significantly improved his nocturia, frequency and urgency. He is happy with his current regiment.    PMH: Past Medical History  Diagnosis Date  . H/O diverticulitis of colon   . GERD (gastroesophageal reflux disease)   . Hyperlipidemia   . Colon polyps   . Diverticulosis   . Bulging disc     C2/3, C3/4, C6/7  . Cervical disc herniation     C4/5 and C5/6  . Spinal stenosis in cervical region     cord abutment C4/5  . Bulge of lumbar disc without myelopathy     L2/3 through L5/6  . Levoscoliosis   . Hemorrhoid   . Hiatal hernia   . Scoliosis   . Benign prostatic hypertrophy with lower urinary tract symptoms (LUTS)   . Testicular pain, left   . Infection of the upper respiratory tract 12/29/2011  . Infection of the upper respiratory tract 12/29/2011  . BPH (benign prostatic hyperplasia) 11/19/2013  . Benign prostatic hyperplasia with urinary obstruction 08/23/2012  . 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 Routine general medical examination at a health care facility 08/31/2012  . Testicular pain, left   . Colon polyp 04/18/2011  . Constipation 08/31/2012    Surgical History: Past Surgical History  Procedure Laterality Date  . Ganglion cyst excision Right 1994    wrist & back  . Hernia repair Left 1994    abdominal repair with mesh  . Nasal septum surgery  2011    Dr. [name]     Home Medications:    Medication List       This list is accurate as of: 04/21/15  9:43 AM.  Always use your most  recent med list.               amoxicillin 875 MG tablet  Commonly known as:  AMOXIL  Reported on 04/21/2015     atorvastatin 10 MG tablet  Commonly known as:  LIPITOR  TAKE 1 TABLET (10 MG TOTAL) BY MOUTH DAILY AT 6 PM.     CIALIS 10 MG tablet  Generic drug:  tadalafil  TAKE 1 TABLET BY MOUTH AS NEEDED     cyclobenzaprine 10 MG tablet  Commonly known as:  FLEXERIL  TAKE 1 TABLET (10 MG TOTAL) BY MOUTH 3 (THREE) TIMES DAILY AS NEEDED FOR MUSCLE SPASMS.     escitalopram 10 MG tablet  Commonly known as:  LEXAPRO  Take 1 tablet (10 mg total) by mouth daily.     escitalopram 10 MG tablet  Commonly known as:  LEXAPRO  Take 1 tablet (10 mg total) by mouth daily.     lactulose 20 g packet  Commonly known as:  CEPHULAC  Take 1 packet (20 g total) by mouth daily.     meloxicam 15 MG tablet  Commonly known as:  MOBIC  TAKE 1 TABLET (15 MG TOTAL) BY MOUTH DAILY.     MYRBETRIQ PO  Take 25 mg by mouth daily.     NASACORT  ALLERGY 24HR 55 MCG/ACT Aero nasal inhaler  Generic drug:  triamcinolone  USE 2 SPRAYS IN EACH NOSTRIL DAILY     pantoprazole 40 MG tablet  Commonly known as:  PROTONIX  Take 1 tablet (40 mg total) by mouth daily.     tamsulosin 0.4 MG Caps capsule  Commonly known as:  FLOMAX  Take 1 capsule (0.4 mg total) by mouth daily after supper.        Allergies:  Allergies  Allergen Reactions  . Other Other (See Comments)    Pollen:  Sinus infection, cough, fatigue    Family History: Family History  Problem Relation Age of Onset  . Hyperlipidemia Mother   . Cancer Mother     skin cancer?  . Heart disease Mother 57    MI - died in her sleep  . Hyperlipidemia Father   . Heart disease Father     Social History:  reports that he quit smoking about 24 years ago. His smoking use included Cigarettes. He quit after 20 years of use. He has never used smokeless tobacco. He reports that he does not drink alcohol or use illicit drugs.  ROS: UROLOGY Frequent  Urination?: Yes Hard to postpone urination?: No Burning/pain with urination?: No Get up at night to urinate?: Yes Leakage of urine?: No Urine stream starts and stops?: No Trouble starting stream?: No Do you have to strain to urinate?: No Blood in urine?: No Urinary tract infection?: No Sexually transmitted disease?: No Injury to kidneys or bladder?: No Painful intercourse?: No Weak stream?: No Erection problems?: No Penile pain?: No  Gastrointestinal Nausea?: No Vomiting?: No Indigestion/heartburn?: Yes Diarrhea?: No Constipation?: Yes  Constitutional Fever: No Night sweats?: No Weight loss?: No Fatigue?: No  Skin Skin rash/lesions?: No Itching?: No  Eyes Blurred vision?: No Double vision?: No  Ears/Nose/Throat Sore throat?: No Sinus problems?: Yes  Hematologic/Lymphatic Swollen glands?: No Easy bruising?: No  Cardiovascular Leg swelling?: No Chest pain?: No  Respiratory Cough?: Yes Shortness of breath?: No  Endocrine Excessive thirst?: No  Musculoskeletal Back pain?: Yes Joint pain?: No  Neurological Headaches?: No Dizziness?: No  Psychologic Depression?: No Anxiety?: Yes  Physical Exam: BP 124/85 mmHg  Pulse 71  Ht 5\' 10"  (1.778 m)  Wt 92.126 kg (203 lb 1.6 oz)  BMI 29.14 kg/m2  Constitutional:  Alert and oriented, No acute distress. HEENT: [place] AT, moist mucus membranes.  Trachea midline, no masses. Cardiovascular: No clubbing, cyanosis, or edema. Respiratory: Normal respiratory effort, no increased work of breathing. GI: Abdomen is soft, nontender, nondistended, no abdominal masses GU: No CVA tenderness.  Skin: No rashes, bruises or suspicious lesions. Lymph: No cervical or inguinal adenopathy. Neurologic: Grossly intact, no focal deficits, moving all 4 extremities. Psychiatric: Normal mood and affect.  Laboratory Data: Lab Results  Component Value Date   WBC 5.3 06/13/2014   HGB 14.8 06/13/2014   HCT 43.5 06/13/2014   MCV  87.3 06/13/2014   PLT 200.0 06/13/2014    Lab Results  Component Value Date   CREATININE 1.01 06/13/2014    Lab Results  Component Value Date   PSA 3.49 11/19/2013    No results found for: TESTOSTERONE  No results found for: HGBA1C  Urinalysis    Component Value Date/Time   GLUCOSEU Negative 11/24/2014 0906   BILIRUBINUR Negative 11/24/2014 0906   BILIRUBINUR neg 11/04/2014 0957   PROTEINUR neg 11/04/2014 0957   UROBILINOGEN 0.2 11/04/2014 0957   NITRITE Negative 11/24/2014 0906   NITRITE neg 11/04/2014 0957  LEUKOCYTESUR Negative 11/24/2014 0906   LEUKOCYTESUR Negative 11/04/2014 0957    Pertinent Imaging: none  Assessment & Plan:    1. OAB (overactive bladder) -continue mirabegron 50mg   - Urinalysis, Complete - Bladder Scan (Post Void Residual) in office  2. Nocturia Continue fluid management - Urinalysis, Complete  3. Benign prostatic hyperplasia with urinary obstruction -continue Cialis 5mg  daily - Urinalysis, Complete - Bladder Scan (Post Void Residual) in office  RTC 3 months No Follow-up on file.  Cleon Gustin, [facility] [address], [place], [place] [address] [phone]

## 2015-04-21 NOTE — Progress Notes (Signed)
Bladder Scan Patient void: 100 ml Performed By: Larna Daughters

## 2015-04-29 ENCOUNTER — Telehealth: Payer: Self-pay | Admitting: Urology

## 2015-04-29 NOTE — Telephone Encounter (Signed)
Pt's insurance denied Myrbetriq 50 mg, he would like some samples when we get some and would like to know what's going on with his insurance.  [facility] faxed forms to [place].  Please call pt at [phone]

## 2015-04-30 NOTE — Telephone Encounter (Signed)
Completed an insurance appeal for pt. Will call pt when samples are in office. Pt voiced understanding.

## 2015-05-26 ENCOUNTER — Telehealth: Payer: Self-pay | Admitting: *Deleted

## 2015-05-26 ENCOUNTER — Other Ambulatory Visit: Payer: Self-pay

## 2015-05-26 MED ORDER — TADALAFIL 10 MG PO TABS: 10.0000 mg | ORAL_TABLET | ORAL | Status: DC | PRN

## 2015-05-26 NOTE — Telephone Encounter (Signed)
Refilled, thanks

## 2015-05-26 NOTE — Telephone Encounter (Signed)
Patient stated that he tried to refill his Cialis, however the insurance company stated that it was to soon to refill, he noticed that the Rx was written for 15 days instead of the 30 days. He would like to have a refill for 30 days. Pharmacy [facility] [place]

## 2015-06-11 ENCOUNTER — Ambulatory Visit: Payer: BLUE CROSS/BLUE SHIELD | Admitting: Internal Medicine

## 2015-06-11 ENCOUNTER — Other Ambulatory Visit: Payer: Self-pay | Admitting: Internal Medicine

## 2015-06-12 ENCOUNTER — Encounter: Payer: Self-pay | Admitting: Gastroenterology

## 2015-06-12 ENCOUNTER — Encounter: Payer: Self-pay | Admitting: Internal Medicine

## 2015-06-12 ENCOUNTER — Ambulatory Visit (INDEPENDENT_AMBULATORY_CARE_PROVIDER_SITE_OTHER): Payer: Medicare Other | Admitting: Internal Medicine

## 2015-06-12 VITALS — BP 120/78 | HR 65 | Wt 199.0 lb

## 2015-06-12 DIAGNOSIS — E7800 Pure hypercholesterolemia, unspecified: Secondary | ICD-10-CM | POA: Diagnosis not present

## 2015-06-12 DIAGNOSIS — F411 Generalized anxiety disorder: Secondary | ICD-10-CM

## 2015-06-12 DIAGNOSIS — R142 Eructation: Secondary | ICD-10-CM | POA: Diagnosis not present

## 2015-06-12 LAB — LIPID PANEL
Cholesterol: 166 mg/dL (ref 0–200)
HDL: 44.6 mg/dL (ref >39.00)
LDL Cholesterol: 106 mg/dL — ABNORMAL HIGH (ref 0–99)
NonHDL: 121.3
Total CHOL/HDL Ratio: 4
Triglycerides: 78 mg/dL (ref 0.0–149.0)
VLDL: 15.6 mg/dL (ref 0.0–40.0)

## 2015-06-12 LAB — COMPREHENSIVE METABOLIC PANEL
ALT: 13 U/L (ref 0–53)
AST: 16 U/L (ref 0–37)
Albumin: 4.2 g/dL (ref 3.5–5.2)
Alkaline Phosphatase: 53 U/L (ref 39–117)
BUN: 17 mg/dL (ref 6–23)
CO2: 29 mEq/L (ref 19–32)
Calcium: 9.6 mg/dL (ref 8.4–10.5)
Chloride: 104 mEq/L (ref 96–112)
Creatinine, Ser: 1 mg/dL (ref 0.40–1.50)
GFR: 81.26 mL/min (ref >60.00)
Glucose, Bld: 90 mg/dL (ref 70–99)
Potassium: 4.5 mEq/L (ref 3.5–5.1)
Sodium: 140 mEq/L (ref 135–145)
Total Bilirubin: 1.6 mg/dL — ABNORMAL HIGH (ref 0.2–1.2)
Total Protein: 6.6 g/dL (ref 6.0–8.3)

## 2015-06-12 MED ORDER — ALIGN 4 MG PO CAPS: ORAL_CAPSULE | ORAL | Status: DC

## 2015-06-12 NOTE — Patient Instructions (Signed)
Start Mylanta or GasX twice daily.  Start [employer]  Consider starting FODMAP diet  Labs today  We will set up GI evaluation.

## 2015-06-12 NOTE — Assessment & Plan Note (Signed)
Chronic issues with belching and epigastric pain/distension. Will add GasX twice daily. FODMAP diet. Will set up second opinion with GI. Question if esophageal motility studies or EGD might be helpful. Continue Pantoprazole.

## 2015-06-12 NOTE — Assessment & Plan Note (Signed)
Continues on Lexapro. Doing well.

## 2015-06-12 NOTE — Progress Notes (Signed)
Pre visit review using our clinic review tool, if applicable. No additional management support is needed unless otherwise documented below in the visit note. 

## 2015-06-12 NOTE — Assessment & Plan Note (Signed)
Repeat lipids and LFTs today. Continue Atorvastatin.

## 2015-06-12 NOTE — Progress Notes (Signed)
Subjective:    Patient ID: Tyler Loop., male    DOB: 08/03/1955, 60 y.o.   MRN: [id number]  HPI  61YO male presents for follow up.  Having worsening issues with gas distension and belching. Feels pressure or pain in upper abdomen and lower chest, then has to belch to relieve it. Not taking anything for this. No specific food triggers. No abdominal pain. No NV. Some constipation. Seen by GI in past and told he was "swallowing air."  Would also like to check lipids today.  Compliant with medication. Otherwise feeling well.  Wt Readings from Last 3 Encounters:  06/12/15 199 lb (90.266 kg)  04/21/15 203 lb 1.6 oz (92.126 kg)  03/31/15 203 lb 9.6 oz (92.352 kg)   BP Readings from Last 3 Encounters:  06/12/15 120/78  04/21/15 124/85  03/31/15 131/76    Past Medical History  Diagnosis Date  . H/O diverticulitis of colon   . GERD (gastroesophageal reflux disease)   . Hyperlipidemia   . Colon polyps   . Diverticulosis   . Bulging disc     C2/3, C3/4, C6/7  . Cervical disc herniation     C4/5 and C5/6  . Spinal stenosis in cervical region     cord abutment C4/5  . Bulge of lumbar disc without myelopathy     L2/3 through L5/6  . Levoscoliosis   . Hemorrhoid   . Hiatal hernia   . Scoliosis   . Benign prostatic hypertrophy with lower urinary tract symptoms (LUTS)   . Testicular pain, left   . Infection of the upper respiratory tract 12/29/2011  . Infection of the upper respiratory tract 12/29/2011  . BPH (benign prostatic hyperplasia) 11/19/2013  . Benign prostatic hyperplasia with urinary obstruction 08/23/2012  . 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 Routine general medical examination at a health care facility 08/31/2012  . Testicular pain, left   . Colon polyp 04/18/2011  . Constipation 08/31/2012   Family History  Problem Relation Age of Onset  . Hyperlipidemia Mother   . Cancer Mother     skin cancer?  . Heart disease Mother 87    MI -  died in her sleep  . Hyperlipidemia Father   . Heart disease Father    Past Surgical History  Procedure Laterality Date  . Ganglion cyst excision Right 1994    wrist & back  . Hernia repair Left 1994    abdominal repair with mesh  . Nasal septum surgery  2011    Dr. [name]    Social History   Social History  . Marital Status: Married    Spouse Name: [name]  . Number of Children: 1  . Years of Education: GED   Occupational History  . Disability     Herniated disk, spinal stenosis   Social History Main Topics  . Smoking status: Former Smoker -- 20 years    Types: Cigarettes    Quit date: 02/08/1991  . Smokeless tobacco: Never Used  . Alcohol Use: No  . Drug Use: No  . Sexual Activity:    Partners: Female   Other Topics Concern  . None   Social History Narrative   [name] was born and raised in [place], [place]. He quit school after the 8th grade because of financial hardship. He was working since age 30. He went back and got his GED. He moved to [place] in 2006. He lives at home with his wife of 12 years  and their 32 year old daughter. Previously, [name] was married to his first wife for 20 years and they divorced. They had no children.  [name] has been disabled 2/2 back problems.      Review of Systems  Constitutional: Negative for fever, chills, activity change, appetite change, fatigue and unexpected weight change.  Eyes: Negative for visual disturbance.  Respiratory: Negative for cough and shortness of breath.   Cardiovascular: Negative for chest pain, palpitations and leg swelling.  Gastrointestinal: Positive for abdominal distention. Negative for nausea, vomiting, abdominal pain, diarrhea and constipation.  Genitourinary: Negative for dysuria, urgency and difficulty urinating.  Musculoskeletal: Negative for myalgias, arthralgias and gait problem.  Skin: Negative for color change and rash.  Hematological: Negative for adenopathy.  Psychiatric/Behavioral: Negative  for sleep disturbance and dysphoric mood. The patient is not nervous/anxious.        Objective:    BP 120/78 mmHg  Pulse 65  Wt 199 lb (90.266 kg)  SpO2 97% Physical Exam  Constitutional: He is oriented to person, place, and time. He appears well-developed and well-nourished. No distress.  HENT:  Head: Normocephalic and atraumatic.  Right Ear: External ear normal.  Left Ear: External ear normal.  Nose: Nose normal.  Mouth/Throat: Oropharynx is clear and moist. No oropharyngeal exudate.  Eyes: Conjunctivae and EOM are normal. Pupils are equal, round, and reactive to light. Right eye exhibits no discharge. Left eye exhibits no discharge. No scleral icterus.  Neck: Normal range of motion. Neck supple. No tracheal deviation present. No thyromegaly present.  Cardiovascular: Normal rate, regular rhythm and normal heart sounds.  Exam reveals no gallop and no friction rub.   No murmur heard. Pulmonary/Chest: Effort normal and breath sounds normal. No accessory muscle usage. No tachypnea. No respiratory distress. He has no decreased breath sounds. He has no wheezes. He has no rhonchi. He has no rales. He exhibits no tenderness.  Musculoskeletal: Normal range of motion. He exhibits no edema.  Lymphadenopathy:    He has no cervical adenopathy.  Neurological: He is alert and oriented to person, place, and time. No cranial nerve deficit. Coordination normal.  Skin: Skin is warm and dry. No rash noted. He is not diaphoretic. No erythema. No pallor.  Psychiatric: He has a normal mood and affect. His behavior is normal. Judgment and thought content normal.          Assessment & Plan:   Problem List Items Addressed This Visit      Unprioritized   Belching - Primary    Chronic issues with belching and epigastric pain/distension. Will add GasX twice daily. FODMAP diet. Will set up second opinion with GI. Question if esophageal motility studies or EGD might be helpful. Continue Pantoprazole.        Relevant Medications   Probiotic Product (ALIGN) 4 MG CAPS   Other Relevant Orders   Ambulatory referral to Gastroenterology   Generalized anxiety disorder    Continues on Lexapro. Doing well.      Pure hypercholesterolemia    Repeat lipids and LFTs today. Continue Atorvastatin.      Relevant Orders   Comprehensive metabolic panel   Lipid panel       Return in about 4 weeks (around 07/10/2015) for Recheck.  Ronette Deter, MD Internal Medicine [facility]

## 2015-06-21 ENCOUNTER — Other Ambulatory Visit: Payer: Self-pay | Admitting: Internal Medicine

## 2015-06-29 ENCOUNTER — Other Ambulatory Visit: Payer: Self-pay | Admitting: Internal Medicine

## 2015-07-09 ENCOUNTER — Other Ambulatory Visit: Payer: Self-pay | Admitting: Internal Medicine

## 2015-07-15 DIAGNOSIS — N451 Epididymitis: Secondary | ICD-10-CM | POA: Diagnosis not present

## 2015-07-22 ENCOUNTER — Ambulatory Visit (INDEPENDENT_AMBULATORY_CARE_PROVIDER_SITE_OTHER): Payer: Medicare Other | Admitting: Urology

## 2015-07-22 ENCOUNTER — Encounter: Payer: Self-pay | Admitting: Urology

## 2015-07-22 VITALS — BP 135/86 | HR 64 | Ht 70.0 in | Wt 196.5 lb

## 2015-07-22 DIAGNOSIS — N401 Enlarged prostate with lower urinary tract symptoms: Secondary | ICD-10-CM | POA: Diagnosis not present

## 2015-07-22 DIAGNOSIS — N138 Other obstructive and reflux uropathy: Secondary | ICD-10-CM

## 2015-07-22 DIAGNOSIS — N3281 Overactive bladder: Secondary | ICD-10-CM

## 2015-07-22 DIAGNOSIS — K5900 Constipation, unspecified: Secondary | ICD-10-CM

## 2015-07-22 DIAGNOSIS — N400 Enlarged prostate without lower urinary tract symptoms: Secondary | ICD-10-CM

## 2015-07-22 LAB — BLADDER SCAN AMB NON-IMAGING: Scan Result: 90

## 2015-07-22 NOTE — Progress Notes (Signed)
07/22/2015 9:16 AM   [name] [birth year] [id number]  Referring provider: [name], MD [id number] [place], [place] [address]  Chief Complaint  Patient presents with  . Follow-up    OAB,BPH and Nocturia    HPI: Patient is a 60 year old Caucasian male with a history of BPH, OAB, nocturia and constipation who presents today for a 3 month follow-up after being placed on Myrbetriq 50 mg daily and Cialis 5 mg daily.  His IPSS score today is 18, which is moderate lower urinary tract symptomatology. He is mostly dissatisfied with his quality life due to his urinary symptoms. His PVR is 90 mL.  His previous PVR is 100 mL.   His major complaint today is urinary frequency, urgency and nocturia..  He has had these symptoms for many  years.  He denies any dysuria, hematuria or suprapubic pain.   He currently taking tamsulosin 0.4 mg daily, but discontinued his Myrbetriq 50 mg one month ago due to stomach issues.  He also denies any recent fevers, chills, nausea or vomiting.  He does not have a family history of PCa.      IPSS      07/22/15 0900       International Prostate Symptom Score   How often have you had the sensation of not emptying your bladder? Less than 1 in 5     How often have you had to urinate less than every two hours? Almost always     How often have you found you stopped and started again several times when you urinated? Not at All     How often have you found it difficult to postpone urination? More than half the time     How often have you had a weak urinary stream? About half the time     How often have you had to strain to start urination? Less than 1 in 5 times     How many times did you typically get up at night to urinate? 4 Times     Total IPSS Score 18     Quality of Life due to urinary symptoms   If you were to spend the rest of your life with your urinary condition just the way it is now how would you feel about that? Mostly  Disatisfied        Score:  1-7 Mild 8-19 Moderate 20-35 Severe     PMH: Past Medical History  Diagnosis Date  . H/O diverticulitis of colon   . GERD (gastroesophageal reflux disease)   . Hyperlipidemia   . Colon polyps   . Diverticulosis   . Bulging disc     C2/3, C3/4, C6/7  . Cervical disc herniation     C4/5 and C5/6  . Spinal stenosis in cervical region     cord abutment C4/5  . Bulge of lumbar disc without myelopathy     L2/3 through L5/6  . Levoscoliosis   . Hemorrhoid   . Hiatal hernia   . Scoliosis   . Benign prostatic hypertrophy with lower urinary tract symptoms (LUTS)   . Testicular pain, left   . Infection of the upper respiratory tract 12/29/2011  . Infection of the upper respiratory tract 12/29/2011  . BPH (benign prostatic hyperplasia) 11/19/2013  . Benign prostatic hyperplasia with urinary obstruction 08/23/2012  . 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 Routine general medical examination at a health care facility 08/31/2012  .  Testicular pain, left   . Colon polyp 04/18/2011  . Constipation 08/31/2012    Surgical History: Past Surgical History  Procedure Laterality Date  . Ganglion cyst excision Right 1994    wrist & back  . Hernia repair Left 1994    abdominal repair with mesh  . Nasal septum surgery  2011    Dr. [name]     Home Medications:    Medication List       This list is accurate as of: 07/22/15  9:16 AM.  Always use your most recent med list.               ALIGN 4 MG Caps  Take 1 capsule by mouth daily     atorvastatin 10 MG tablet  Commonly known as:  LIPITOR  TAKE 1 TABLET (10 MG TOTAL) BY MOUTH DAILY AT 6 PM.     cyclobenzaprine 10 MG tablet  Commonly known as:  FLEXERIL  TAKE 1 TABLET(10 MG) BY MOUTH THREE TIMES DAILY AS NEEDED FOR MUSCLE SPASMS     escitalopram 10 MG tablet  Commonly known as:  LEXAPRO  Take 1 tablet (10 mg total) by mouth daily.     meloxicam 15 MG tablet  Commonly known  as:  MOBIC  TAKE 1 TABLET BY MOUTH DAILY     NASACORT ALLERGY 24HR 55 MCG/ACT Aero nasal inhaler  Generic drug:  triamcinolone  USE 2 SPRAYS IN EACH NOSTRIL DAILY     pantoprazole 40 MG tablet  Commonly known as:  PROTONIX  Take 1 tablet (40 mg total) by mouth daily.     tadalafil 10 MG tablet  Commonly known as:  CIALIS  Take 1 tablet (10 mg total) by mouth as needed.     tamsulosin 0.4 MG Caps capsule  Commonly known as:  FLOMAX  Take 1 capsule (0.4 mg total) by mouth daily after supper.        Allergies:  Allergies  Allergen Reactions  . Myrbetriq [Mirabegron]     Upset stomach  . Other Other (See Comments)    Pollen:  Sinus infection, cough, fatigue    Family History: Family History  Problem Relation Age of Onset  . Hyperlipidemia Mother   . Cancer Mother     skin cancer?  . Heart disease Mother 22    MI - died in her sleep  . Hyperlipidemia Father   . Heart disease Father     Social History:  reports that he quit smoking about 24 years ago. His smoking use included Cigarettes. He quit after 20 years of use. He has never used smokeless tobacco. He reports that he does not drink alcohol or use illicit drugs.  ROS: UROLOGY Frequent Urination?: Yes Hard to postpone urination?: Yes Burning/pain with urination?: No Get up at night to urinate?: Yes Leakage of urine?: No Urine stream starts and stops?: No Trouble starting stream?: No Do you have to strain to urinate?: No Blood in urine?: No Urinary tract infection?: No Sexually transmitted disease?: No Injury to kidneys or bladder?: No Painful intercourse?: No Weak stream?: No Erection problems?: No Penile pain?: No  Gastrointestinal Nausea?: No Vomiting?: No Indigestion/heartburn?: No Diarrhea?: No Constipation?: Yes  Constitutional Fever: No Night sweats?: No Weight loss?: No Fatigue?: Yes  Skin Skin rash/lesions?: No Itching?: No  Eyes Blurred vision?: No Double vision?:  No  Ears/Nose/Throat Sore throat?: No Sinus problems?: No  Hematologic/Lymphatic Swollen glands?: No Easy bruising?: No  Cardiovascular Leg swelling?: No Chest pain?:  No  Respiratory Cough?: Yes Shortness of breath?: No  Endocrine Excessive thirst?: No  Musculoskeletal Back pain?: Yes Joint pain?: No  Neurological Headaches?: No Dizziness?: No  Psychologic Depression?: Yes Anxiety?: Yes  Physical Exam: BP 135/86 mmHg  Pulse 64  Ht 5\' 10"  (1.778 m)  Wt 196 lb 8 oz (89.132 kg)  BMI 28.19 kg/m2  Constitutional: Well nourished. Alert and oriented, No acute distress. HEENT: [place] AT, moist mucus membranes. Trachea midline, no masses. Cardiovascular: No clubbing, cyanosis, or edema. Respiratory: Normal respiratory effort, no increased work of breathing. GI: Abdomen is soft, non tender, non distended, no abdominal masses. Liver and spleen not palpable.  No hernias appreciated.  Stool sample for occult testing is not indicated.   GU: No CVA tenderness.  No bladder fullness or masses.  Patient with circumcised phallus.  Urethral meatus is patent.  No penile discharge. No penile lesions or rashes. Scrotum without lesions, cysts, rashes and/or edema.  Testicles are located scrotally bilaterally. No masses are appreciated in the testicles. Right epididymis are normal.  Left epididymis is tender and indurated.   Rectal: Deferred.   Skin: No rashes, bruises or suspicious lesions. Lymph: No cervical or inguinal adenopathy. Neurologic: Grossly intact, no focal deficits, moving all 4 extremities. Psychiatric: Normal mood and affect.  Laboratory Data: Lab Results  Component Value Date   WBC 5.3 06/13/2014   HGB 14.8 06/13/2014   HCT 43.5 06/13/2014   MCV 87.3 06/13/2014   PLT 200.0 06/13/2014    Lab Results  Component Value Date   CREATININE 1.00 06/12/2015    Lab Results  Component Value Date   PSA 3.49 11/19/2013  PSA                             3.6 ng/mL                                                        11/24/2014   Lab Results  Component Value Date   TSH 0.72 06/13/2014       Component Value Date/Time   CHOL 166 06/12/2015 0820   HDL 44.60 06/12/2015 0820   CHOLHDL 4 06/12/2015 0820   VLDL 15.6 06/12/2015 0820   LDLCALC 106* 06/12/2015 0820    Lab Results  Component Value Date   AST 16 06/12/2015   Lab Results  Component Value Date   ALT 13 06/12/2015      Pertinent Imaging: Results for DEVRICK, FORESTIER (MRN [id number]) as of 07/22/2015 09:13  Ref. Range 07/22/2015 08:58  Scan Result Unknown 90    Assessment & Plan:    1. BPH (benign prostatic hyperplasia) with LUTS:      - IPSS score is 18/4  - Continue tamsulosin 0.4 mg daily  - RTC in 3 weeks for IPSS, exam and PVR   - Bladder Scan (Post Void Residual) in office  2. OAB:   I swelling to the patient that stomach issues are not typical side effects of the Myrbetriq. He has used stool softeners and laxatives and stomach issues has resolved for now. He would like to retry the Myrbetriq 50 mg at this time. I have given him samples, #28. He will return in 3 weeks for I PSS score and PVR.  3.  Constipation:   Currently managed with over-the-counter stool softeners and laxatives.   Return in about 3 weeks (around 08/12/2015) for PVR, IPSS and exam.  These notes generated with voice recognition software. I apologize for typographical errors.  Zara Council, [facility] [address] [place], [place] [address] [phone]

## 2015-07-23 ENCOUNTER — Encounter: Payer: Self-pay | Admitting: Internal Medicine

## 2015-07-23 ENCOUNTER — Ambulatory Visit (INDEPENDENT_AMBULATORY_CARE_PROVIDER_SITE_OTHER): Payer: Medicare Other | Admitting: Internal Medicine

## 2015-07-23 VITALS — BP 138/80 | HR 62 | Ht 70.0 in | Wt 198.0 lb

## 2015-07-23 DIAGNOSIS — R142 Eructation: Secondary | ICD-10-CM

## 2015-07-23 DIAGNOSIS — R42 Dizziness and giddiness: Secondary | ICD-10-CM | POA: Insufficient documentation

## 2015-07-23 DIAGNOSIS — F4323 Adjustment disorder with mixed anxiety and depressed mood: Secondary | ICD-10-CM | POA: Diagnosis not present

## 2015-07-23 NOTE — Assessment & Plan Note (Signed)
Symptoms improved with use of GasX. Will continue. Encouraged follow up with GI.

## 2015-07-23 NOTE — Patient Instructions (Addendum)
Continue current medication.  We will set up an evaluation with cardiology for possible repeat stress test.  Follow up here in 4 weeks.

## 2015-07-23 NOTE — Progress Notes (Signed)
Subjective:    Patient ID: [name]., male    DOB: 1955-11-24, 60 y.o.   MRN: [id number]  HPI  60YO male presents for follow up.  Last seen in 06/2015 for belching. Set up second opinion with GI. Started GasX Evaluation with GI is pending. However symptoms of belching improved with GasX. Taking about 3 times per week.  Seen by Urology. No new medication started. Has follow up in 3 weeks.  Difficult time for him. Separating from wife.  Had episode of blurred vision, diaphoresis while at home a few weeks ago. Woke him from sleep. Resolved without intervention after 1-2 hr. No chest pain during that episode. Felt lightheaded and short of breath. This is second episode like this. Former smoker.  Wt Readings from Last 3 Encounters:  07/23/15 198 lb (89.812 kg)  07/22/15 196 lb 8 oz (89.132 kg)  06/12/15 199 lb (90.266 kg)   BP Readings from Last 3 Encounters:  07/23/15 138/80  07/22/15 135/86  06/12/15 120/78    Past Medical History  Diagnosis Date  . H/O diverticulitis of colon   . GERD (gastroesophageal reflux disease)   . Hyperlipidemia   . Colon polyps   . Diverticulosis   . Bulging disc     C2/3, C3/4, C6/7  . Cervical disc herniation     C4/5 and C5/6  . Spinal stenosis in cervical region     cord abutment C4/5  . Bulge of lumbar disc without myelopathy     L2/3 through L5/6  . Levoscoliosis   . Hemorrhoid   . Hiatal hernia   . Scoliosis   . Benign prostatic hypertrophy with lower urinary tract symptoms (LUTS)   . Testicular pain, left   . Infection of the upper respiratory tract 12/29/2011  . Infection of the upper respiratory tract 12/29/2011  . BPH (benign prostatic hyperplasia) 11/19/2013  . Benign prostatic hyperplasia with urinary obstruction 08/23/2012  . 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 Routine general medical examination at a health care facility 08/31/2012  . Testicular pain, left   . Colon polyp 04/18/2011  .  Constipation 08/31/2012   Family History  Problem Relation Age of Onset  . Hyperlipidemia Mother   . Cancer Mother     skin cancer?  . Heart disease Mother 53    MI - died in her sleep  . Hyperlipidemia Father   . Heart disease Father    Past Surgical History  Procedure Laterality Date  . Ganglion cyst excision Right 1994    wrist & back  . Hernia repair Left 1994    abdominal repair with mesh  . Nasal septum surgery  2011    Dr. [name]    Social History   Social History  . Marital Status: Married    Spouse Name: [name]  . Number of Children: 1  . Years of Education: GED   Occupational History  . Disability     Herniated disk, spinal stenosis   Social History Main Topics  . Smoking status: Former Smoker -- 20 years    Types: Cigarettes    Quit date: 02/08/1991  . Smokeless tobacco: Never Used  . Alcohol Use: No  . Drug Use: No  . Sexual Activity:    Partners: Female   Other Topics Concern  . None   Social History Narrative   Kyzar was born and raised in [place], [place]. He quit school after the 8th grade because of financial hardship.  He was working since age 77. He went back and got his GED. He moved to [place] in 2006. He lives at home with his wife of 67 years and their 26 year old daughter. Previously, Rahsan was married to his first wife for 20 years and they divorced. They had no children.  Yosgart has been disabled 2/2 back problems.      Review of Systems  Constitutional: Positive for diaphoresis. Negative for fever, chills, activity change, appetite change, fatigue and unexpected weight change.  Eyes: Negative for visual disturbance.  Respiratory: Positive for shortness of breath. Negative for cough.   Cardiovascular: Negative for chest pain, palpitations and leg swelling.  Gastrointestinal: Negative for nausea, vomiting, abdominal pain, diarrhea, constipation and abdominal distention.  Genitourinary: Negative for dysuria, urgency and difficulty  urinating.  Musculoskeletal: Negative for arthralgias and gait problem.  Skin: Negative for color change and rash.  Neurological: Positive for dizziness and light-headedness. Negative for syncope, weakness and headaches.  Hematological: Negative for adenopathy.  Psychiatric/Behavioral: Positive for dysphoric mood. Negative for sleep disturbance. The patient is nervous/anxious.        Objective:    BP 138/80 mmHg  Pulse 62  Ht 5\' 10"  (1.778 m)  Wt 198 lb (89.812 kg)  BMI 28.41 kg/m2  SpO2 98% Physical Exam  Constitutional: He is oriented to person, place, and time. He appears well-developed and well-nourished. No distress.  HENT:  Head: Normocephalic and atraumatic.  Right Ear: External ear normal.  Left Ear: External ear normal.  Nose: Nose normal.  Mouth/Throat: Oropharynx is clear and moist. No oropharyngeal exudate.  Eyes: Conjunctivae and EOM are normal. Pupils are equal, round, and reactive to light. Right eye exhibits no discharge. Left eye exhibits no discharge. No scleral icterus.  Neck: Normal range of motion. Neck supple. No tracheal deviation present. No thyromegaly present.  Cardiovascular: Normal rate, regular rhythm and normal heart sounds.  Exam reveals no gallop and no friction rub.   No murmur heard. Pulmonary/Chest: Effort normal and breath sounds normal. No accessory muscle usage. No tachypnea. No respiratory distress. He has no decreased breath sounds. He has no wheezes. He has no rhonchi. He has no rales. He exhibits no tenderness.  Musculoskeletal: Normal range of motion. He exhibits no edema.  Lymphadenopathy:    He has no cervical adenopathy.  Neurological: He is alert and oriented to person, place, and time. No cranial nerve deficit. Coordination normal.  Skin: Skin is warm and dry. No rash noted. He is not diaphoretic. No erythema. No pallor.  Psychiatric: He has a normal mood and affect. His behavior is normal. Judgment and thought content normal.           Assessment & Plan:   Problem List Items Addressed This Visit      Unprioritized   Adjustment disorder with mixed anxiety and depressed mood    Offered support today. Encouraged him to consider counseling which he declines for now. Will continue Lexapro. Follow up in 4 weeks.      Belching - Primary    Symptoms improved with use of GasX. Will continue. Encouraged follow up with GI.      Episodic lightheadedness    Intermittent lightheadedness and diaphoresis. Question if this might be related to anxiety and panic, however he has not had panic attacks in the past. EKG today showed no acute changes. Will set up cardiology evaluation for possible repeat stress test. Follow up in 4 weeks and prn. If cardiology workup normal, consider adding  Clonazepam prn.      Relevant Orders   EKG 12-Lead (Completed)   Ambulatory referral to Cardiology       Return in about 4 weeks (around 08/20/2015) for Recheck.  Ronette Deter, MD Internal Medicine [facility]

## 2015-07-23 NOTE — Progress Notes (Signed)
Pre visit review using our clinic review tool, if applicable. No additional management support is needed unless otherwise documented below in the visit note. 

## 2015-07-23 NOTE — Assessment & Plan Note (Signed)
Intermittent lightheadedness and diaphoresis. Question if this might be related to anxiety and panic, however he has not had panic attacks in the past. EKG today showed no acute changes. Will set up cardiology evaluation for possible repeat stress test. Follow up in 4 weeks and prn. If cardiology workup normal, consider adding Clonazepam prn.

## 2015-07-23 NOTE — Assessment & Plan Note (Signed)
Offered support today. Encouraged him to consider counseling which he declines for now. Will continue Lexapro. Follow up in 4 weeks.

## 2015-08-07 ENCOUNTER — Other Ambulatory Visit: Payer: Self-pay | Admitting: Internal Medicine

## 2015-08-13 ENCOUNTER — Encounter: Payer: Self-pay | Admitting: Urology

## 2015-08-13 ENCOUNTER — Ambulatory Visit (INDEPENDENT_AMBULATORY_CARE_PROVIDER_SITE_OTHER): Payer: Medicare Other | Admitting: Urology

## 2015-08-13 VITALS — BP 117/77 | HR 80 | Ht 70.0 in | Wt 196.3 lb

## 2015-08-13 DIAGNOSIS — N3281 Overactive bladder: Secondary | ICD-10-CM | POA: Diagnosis not present

## 2015-08-13 DIAGNOSIS — N401 Enlarged prostate with lower urinary tract symptoms: Secondary | ICD-10-CM

## 2015-08-13 DIAGNOSIS — N138 Other obstructive and reflux uropathy: Secondary | ICD-10-CM

## 2015-08-13 DIAGNOSIS — K5900 Constipation, unspecified: Secondary | ICD-10-CM

## 2015-08-13 LAB — BLADDER SCAN AMB NON-IMAGING: Scan Result: 89

## 2015-08-13 NOTE — Progress Notes (Signed)
8:44 AM   [name]. 13-Aug-1955 [id number]  Referring provider: [name], MD [id number] [place], [place] [address]  Chief Complaint  Patient presents with  . Benign Prostatic Hypertrophy    3 week follow up  . Over Active Bladder    HPI: Patient is a 60 year old WM who presents today to discuss his response to the Myrbetriq 50 mg daily.  Background history Patient with a history of BPH, OAB, nocturia and constipation currently on Cialis 5 mg, Myrbetriq 50 mg and tamsulosin 0.4 mg daily.    His IPSS score today is 21, which is moderate lower urinary tract symptomatology. He is mostly dissatisfied with his quality life due to his urinary symptoms. His PVR is 89 mL.  His previous IPSS score is 18/4.  His previous PVR is 90 mL.   His major complaint today is urinary frequency, urgency and nocturia..  He has had these symptoms for many  years.  He denies any dysuria, hematuria or suprapubic pain.   He currently taking Cialis 5 mg daily, tamsulosin 0.4 mg daily and Myrbetriq 50 mg.  He also denies any recent fevers, chills, nausea or vomiting.  He does not have a family history of PCa.      IPSS      07/22/15 0900 08/13/15 0800     International Prostate Symptom Score   How often have you had the sensation of not emptying your bladder? Less than 1 in 5 More than half the time    How often have you had to urinate less than every two hours? Almost always More than half the time    How often have you found you stopped and started again several times when you urinated? Not at All About half the time    How often have you found it difficult to postpone urination? More than half the time About half the time    How often have you had a weak urinary stream? About half the time About half the time    How often have you had to strain to start urination? Less than 1 in 5 times Less than 1 in 5 times    How many times did you typically get up at night to  urinate? 4 Times 3 Times    Total IPSS Score 18 21    Quality of Life due to urinary symptoms   If you were to spend the rest of your life with your urinary condition just the way it is now how would you feel about that? Mostly Disatisfied Mostly Disatisfied       Score:  1-7 Mild 8-19 Moderate 20-35 Severe     PMH: Past Medical History  Diagnosis Date  . H/O diverticulitis of colon   . GERD (gastroesophageal reflux disease)   . Hyperlipidemia   . Colon polyps   . Diverticulosis   . Bulging disc     C2/3, C3/4, C6/7  . Cervical disc herniation     C4/5 and C5/6  . Spinal stenosis in cervical region     cord abutment C4/5  . Bulge of lumbar disc without myelopathy     L2/3 through L5/6  . Levoscoliosis   . Hemorrhoid   . Hiatal hernia   . Scoliosis   . Benign prostatic hypertrophy with lower urinary tract symptoms (LUTS)   . Testicular pain, left   . Infection of the upper respiratory tract 12/29/2011  . Infection of  the upper respiratory tract 12/29/2011  . BPH (benign prostatic hyperplasia) 11/19/2013  . Benign prostatic hyperplasia with urinary obstruction 08/23/2012  . 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 Routine general medical examination at a health care facility 08/31/2012  . Testicular pain, left   . Colon polyp 04/18/2011  . Constipation 08/31/2012    Surgical History: Past Surgical History  Procedure Laterality Date  . Ganglion cyst excision Right 1994    wrist & back  . Hernia repair Left 1994    abdominal repair with mesh  . Nasal septum surgery  2011    Dr. [name]     Home Medications:    Medication List       This list is accurate as of: 08/13/15  8:44 AM.  Always use your most recent med list.               ALIGN 4 MG Caps  Take 1 capsule by mouth daily     atorvastatin 10 MG tablet  Commonly known as:  LIPITOR  TAKE 1 TABLET (10 MG TOTAL) BY MOUTH DAILY AT 6 PM.     cyclobenzaprine 10 MG tablet  Commonly  known as:  FLEXERIL  TAKE 1 TABLET(10 MG) BY MOUTH THREE TIMES DAILY AS NEEDED FOR MUSCLE SPASMS     escitalopram 10 MG tablet  Commonly known as:  LEXAPRO  Take 1 tablet (10 mg total) by mouth daily.     meloxicam 15 MG tablet  Commonly known as:  MOBIC  TAKE 1 TABLET BY MOUTH DAILY     MYRBETRIQ 50 MG Tb24 tablet  Generic drug:  mirabegron ER  Take 50 mg by mouth daily.     NASACORT ALLERGY 24HR 55 MCG/ACT Aero nasal inhaler  Generic drug:  triamcinolone  USE 2 SPRAYS IN EACH NOSTRIL DAILY     pantoprazole 40 MG tablet  Commonly known as:  PROTONIX  Take 1 tablet (40 mg total) by mouth daily.     tadalafil 10 MG tablet  Commonly known as:  CIALIS  Take 1 tablet (10 mg total) by mouth as needed.     tamsulosin 0.4 MG Caps capsule  Commonly known as:  FLOMAX  Take 1 capsule (0.4 mg total) by mouth daily after supper.        Allergies:  Allergies  Allergen Reactions  . Other Other (See Comments)    Pollen:  Sinus infection, cough, fatigue    Family History: Family History  Problem Relation Age of Onset  . Hyperlipidemia Mother   . Cancer Mother     skin cancer?  . Heart disease Mother 57    MI - died in her sleep  . Hyperlipidemia Father   . Heart disease Father   . Kidney disease Neg Hx   . Prostate cancer Neg Hx     Social History:  reports that he quit smoking about 24 years ago. His smoking use included Cigarettes. He quit after 20 years of use. He has never used smokeless tobacco. He reports that he does not drink alcohol or use illicit drugs.  ROS: UROLOGY Frequent Urination?: Yes Hard to postpone urination?: No Burning/pain with urination?: No Get up at night to urinate?: Yes Leakage of urine?: No Urine stream starts and stops?: Yes Trouble starting stream?: No Do you have to strain to urinate?: No Blood in urine?: No Urinary tract infection?: No Sexually transmitted disease?: No Injury to kidneys or bladder?: No Painful intercourse?:  No Weak  stream?: Yes Erection problems?: No Penile pain?: No  Gastrointestinal Nausea?: No Vomiting?: No Indigestion/heartburn?: Yes Diarrhea?: No Constipation?: Yes  Constitutional Fever: No Night sweats?: No Weight loss?: No Fatigue?: Yes  Skin Skin rash/lesions?: No Itching?: No  Eyes Blurred vision?: No Double vision?: No  Ears/Nose/Throat Sore throat?: No Sinus problems?: Yes  Hematologic/Lymphatic Swollen glands?: No Easy bruising?: No  Cardiovascular Leg swelling?: No Chest pain?: No  Respiratory Cough?: No Shortness of breath?: No  Endocrine Excessive thirst?: No  Musculoskeletal Back pain?: Yes Joint pain?: No  Neurological Headaches?: No Dizziness?: No  Psychologic Depression?: Yes Anxiety?: Yes  Physical Exam: BP 117/77 mmHg  Pulse 80  Ht 5\' 10"  (1.778 m)  Wt 196 lb 4.8 oz (89.041 kg)  BMI 28.17 kg/m2  Constitutional: Well nourished. Alert and oriented, No acute distress. HEENT: [place] AT, moist mucus membranes. Trachea midline, no masses. Cardiovascular: No clubbing, cyanosis, or edema. Respiratory: Normal respiratory effort, no increased work of breathing. Skin: No rashes, bruises or suspicious lesions. Lymph: No cervical or inguinal adenopathy. Neurologic: Grossly intact, no focal deficits, moving all 4 extremities. Psychiatric: Normal mood and affect.  Laboratory Data: Lab Results  Component Value Date   WBC 5.3 06/13/2014   HGB 14.8 06/13/2014   HCT 43.5 06/13/2014   MCV 87.3 06/13/2014   PLT 200.0 06/13/2014    Lab Results  Component Value Date   CREATININE 1.00 06/12/2015    Lab Results  Component Value Date   PSA 3.49 11/19/2013  PSA                             3.6 ng/mL                                                       11/24/2014   Lab Results  Component Value Date   TSH 0.72 06/13/2014       Component Value Date/Time   CHOL 166 06/12/2015 0820   HDL 44.60 06/12/2015 0820   CHOLHDL 4 06/12/2015  0820   VLDL 15.6 06/12/2015 0820   LDLCALC 106* 06/12/2015 0820    Lab Results  Component Value Date   AST 16 06/12/2015   Lab Results  Component Value Date   ALT 13 06/12/2015      Pertinent Imaging: Results for PABLO, HEMSATH (MRN [id number]) as of 08/13/2015 08:46  Ref. Range 08/13/2015 08:30  Scan Result Unknown 89   Assessment & Plan:    1. BPH (benign prostatic hyperplasia) with LUTS:      - IPSS score is 18/4  - Discontinue tamsulosin 0.4 mg daily  - Continue the Cialis 5 mg daily  - RTC in 3 months for IPSSand PVR   - Bladder Scan (Post Void Residual) in office  2. OAB:   He is still having stomach issues, but he believes it is from life stressors.  He would like to continue the Myrbetriq 50 mg for three more months.   have given him samples, #28. He will return in 3 months for I PSS score and PVR.  We need to provide patient with samples.    3. Constipation:   Currently managed with over-the-counter stool softeners and laxatives.   Return in about 3 months (around 11/13/2015) for PVR and IPSS score.  These notes generated with voice recognition software. I apologize for typographical errors.  Zara Council, [facility] [address], [place], [place] [address] [phone]

## 2015-08-25 ENCOUNTER — Ambulatory Visit: Payer: BLUE CROSS/BLUE SHIELD | Admitting: Cardiovascular Disease

## 2015-08-25 ENCOUNTER — Ambulatory Visit: Payer: BLUE CROSS/BLUE SHIELD | Admitting: Gastroenterology

## 2015-08-27 ENCOUNTER — Ambulatory Visit: Payer: BLUE CROSS/BLUE SHIELD | Admitting: Cardiovascular Disease

## 2015-08-28 ENCOUNTER — Other Ambulatory Visit: Payer: Self-pay | Admitting: Internal Medicine

## 2015-08-28 DIAGNOSIS — M542 Cervicalgia: Secondary | ICD-10-CM | POA: Diagnosis not present

## 2015-08-28 DIAGNOSIS — S161XXD Strain of muscle, fascia and tendon at neck level, subsequent encounter: Secondary | ICD-10-CM | POA: Diagnosis not present

## 2015-08-28 DIAGNOSIS — S39012D Strain of muscle, fascia and tendon of lower back, subsequent encounter: Secondary | ICD-10-CM | POA: Diagnosis not present

## 2015-08-28 DIAGNOSIS — M545 Low back pain: Secondary | ICD-10-CM | POA: Diagnosis not present

## 2015-08-31 ENCOUNTER — Ambulatory Visit (INDEPENDENT_AMBULATORY_CARE_PROVIDER_SITE_OTHER): Payer: Medicare Other | Admitting: Cardiovascular Disease

## 2015-08-31 ENCOUNTER — Encounter: Payer: Self-pay | Admitting: Cardiovascular Disease

## 2015-08-31 VITALS — BP 130/90 | HR 60 | Ht 70.0 in | Wt 198.2 lb

## 2015-08-31 DIAGNOSIS — R079 Chest pain, unspecified: Secondary | ICD-10-CM

## 2015-08-31 DIAGNOSIS — R0602 Shortness of breath: Secondary | ICD-10-CM

## 2015-08-31 NOTE — Progress Notes (Signed)
Cardiology Office Note   Date:  08/31/2015   ID:  Tyler Loop., DOB 02/26/1955, MRN [id number]  PCP:  Rica Mast, MD  Cardiologist:   Kathlyn Sacramento, MD   Chief Complaint  Patient presents with  . Other    Ref by Dr. Gilford Rile for dizziness, shortness of breath, "head tension" and breaks out into a sweat. Meds reviewed by the patient verbally.       History of Present Illness: [name]. is a 60 y.o. male who Was referred by Dr. Gilford Rile for evaluation of dizziness, shortness of breath and atypical chest pain. The patient has no previous cardiac history and had a nuclear stress test wife years ago which was unremarkable. He has known history of hyperlipidemia but no diabetes or hypertension. He quit smoking in 1993 and does not drink alcohol. He suffers from chronic back and neck pain and thus is not able to exercise or be active physically. He has been under stress recently as he is going through separation from his wife. He had intermittent episodes of dizziness both at rest and when standing up. This has been associated with increased headache. He reports increased exertional dyspnea. He has substernal pain described as heartburn and he thinks it's due to GERD. He has no exertional chest discomfort. No orthopnea or PND. No palpitations, syncope or presyncope.    Past Medical History:  Diagnosis Date  . Benign prostatic hyperplasia with urinary obstruction 08/23/2012  . Benign prostatic hypertrophy with lower urinary tract symptoms (LUTS)   . BPH (benign prostatic hyperplasia) 11/19/2013  . 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 Bulging disc    C2/3, C3/4, C6/7  . Cervical disc herniation    C4/5 and C5/6  . Colon polyp 04/18/2011  . Colon polyps   . Constipation 08/31/2012  . Diverticulosis   . 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 GERD (gastroesophageal reflux disease)   . H/O diverticulitis of colon   .  Hemorrhoid   . Hiatal hernia   . Hyperlipidemia   . Infection of the upper respiratory tract 12/29/2011  . Infection of the upper respiratory tract 12/29/2011  . Levoscoliosis   . Routine general medical examination at a health care facility 08/31/2012  . Scoliosis   . Spinal stenosis in cervical region    cord abutment C4/5  . Testicular pain, left   . Testicular pain, left     Past Surgical History:  Procedure Laterality Date  . GANGLION CYST EXCISION Right 1994   wrist & back  . HERNIA REPAIR Left 1994   abdominal repair with mesh  . NASAL SEPTUM SURGERY  2011   Dr. [name]      Current Outpatient Prescriptions  Medication Sig Dispense Refill  . atorvastatin (LIPITOR) 10 MG tablet TAKE 1 TABLET BY MOUTH EVERY EVENING AT 6 PM 90 tablet 0  . cyclobenzaprine (FLEXERIL) 10 MG tablet TAKE 1 TABLET(10 MG) BY MOUTH THREE TIMES DAILY AS NEEDED FOR MUSCLE SPASMS 30 tablet 0  . escitalopram (LEXAPRO) 10 MG tablet Take 1 tablet (10 mg total) by mouth daily. 90 tablet 2  . meloxicam (MOBIC) 15 MG tablet TAKE 1 TABLET BY MOUTH DAILY 30 tablet 0  . mirabegron ER (MYRBETRIQ) 50 MG TB24 tablet Take 50 mg by mouth daily.    [name] NASACORT ALLERGY 24HR 55 MCG/ACT AERO nasal inhaler USE 2 SPRAYS IN EACH NOSTRIL DAILY 16.5 mL 5  .  pantoprazole (PROTONIX) 40 MG tablet Take 1 tablet (40 mg total) by mouth daily. 90 tablet 1  . Probiotic Product (ALIGN) 4 MG CAPS Take 1 capsule by mouth daily 30 capsule 3  . tadalafil (CIALIS) 10 MG tablet Take 1 tablet (10 mg total) by mouth as needed. 30 tablet 3   No current facility-administered medications for this visit.     Allergies:   Other    Social History:  The patient  reports that he quit smoking about 24 years ago. His smoking use included Cigarettes. He quit after 20.00 years of use. He has never used smokeless tobacco. He reports that he does not drink alcohol or use drugs.   Family History:  The patient's family history includes Cancer in his  mother; Heart disease in his father; Heart disease (age of onset: 39) in his mother; Hyperlipidemia in his father and mother.    ROS:  Please see the history of present illness.   Otherwise, review of systems are positive for none.   All other systems are reviewed and negative.    PHYSICAL EXAM: VS:  BP 130/90 (BP Location: Left Arm, Patient Position: Sitting, Cuff Size: Normal)   Pulse 60   Ht 5\' 10"  (1.778 m)   Wt 198 lb 4 oz (89.9 kg)   BMI 28.45 kg/m  , BMI Body mass index is 28.45 kg/m. GEN: Well nourished, well developed, in no acute distress  HEENT: normal  Neck: no JVD, carotid bruits, or masses Cardiac: RRR; no murmurs, rubs, or gallops,no edema  Respiratory:  clear to auscultation bilaterally, normal work of breathing GI: soft, nontender, nondistended, + BS MS: no deformity or atrophy  Skin: warm and dry, no rash Neuro:  Strength and sensation are intact Psych: euthymic mood, full affect   EKG:  EKG is ordered today. The ekg ordered today demonstrates normal sinus rhythm with no significant ST or T wave changes.   Recent Labs: 06/12/2015: ALT 13; BUN 17; Creatinine, Ser 1.00; Potassium 4.5; Sodium 140    Lipid Panel    Component Value Date/Time   CHOL 166 06/12/2015 0820   TRIG 78.0 06/12/2015 0820   HDL 44.60 06/12/2015 0820   CHOLHDL 4 06/12/2015 0820   VLDL 15.6 06/12/2015 0820   LDLCALC 106 (H) 06/12/2015 0820      Wt Readings from Last 3 Encounters:  08/31/15 198 lb 4 oz (89.9 kg)  08/13/15 196 lb 4.8 oz (89 kg)  07/23/15 198 lb (89.8 kg)        ASSESSMENT AND PLAN:  1.  Atypical chest pain/Shortness of breath. Some symptoms are suggestive of GERD. Exertional dyspnea could be due to physical deconditioning given the lack of regular physical activities. However, underlying coronary ischemia cannot be excluded. Thus, I recommend evaluation with a pharmacologic nuclear stress test. The patient is not able to exercise on a treadmill due to chronic  back and neck pain.  2. Dizziness and headache: The etiology is not entirely clear. His EKG is benign and he is not at high risk for arrhythmia. If symptoms continue, consider brain imaging.  3. Hyperlipidemia: Currently on atorvastatin with most recent LDL of 106.    Disposition:   FU with me as needed.   Signed,  Kathlyn Sacramento, MD  08/31/2015 4:10 PM    [facility]

## 2015-08-31 NOTE — Patient Instructions (Addendum)
Medication Instructions:  Your physician recommends that you continue on your current medications as directed. Please refer to the Current Medication list given to you today.   Labwork: None ordered  Testing/Procedures: [place]  Your caregiver has ordered a Stress Test with nuclear imaging. The purpose of this test is to evaluate the blood supply to your heart muscle. This procedure is referred to as a "Non-Invasive Stress Test." This is because other than having an IV started in your vein, nothing is inserted or "invades" your body. Cardiac stress tests are done to find areas of poor blood flow to the heart by determining the extent of coronary artery disease (CAD). Some patients exercise on a treadmill, which naturally increases the blood flow to your heart, while others who are  unable to walk on a treadmill due to physical limitations have a pharmacologic/chemical stress agent called Lexiscan . This medicine will mimic walking on a treadmill by temporarily increasing your coronary blood flow.   Please note: these test may take anywhere between 2-4 hours to complete  PLEASE REPORT TO [place] AT THE FIRST DESK WILL DIRECT YOU WHERE TO GO  Date of Procedure:_Tuesday, September 08, 2015 at 07:30AM___  Arrival Time for Procedure:_Arrive at 07:15AM to register___   PLEASE NOTIFY THE OFFICE AT LEAST 24 HOURS IN ADVANCE IF YOU ARE UNABLE TO [place].  [phone] AND  PLEASE NOTIFY NUCLEAR MEDICINE AT [facility] AT LEAST 24 HOURS IN ADVANCE IF YOU ARE UNABLE TO KEEP YOUR APPOINTMENT. [phone]  How to prepare for your Myoview test:  1. Do not eat or drink after midnight 2. No caffeine for 24 hours prior to test 3. No smoking 24 hours prior to test. 4. Your medication may be taken with water.  If your doctor stopped a medication because of this test, do not take that medication. 5. Ladies, please do not wear dresses.  Skirts or pants are  appropriate. Please wear a short sleeve shirt. 6. No perfume, cologne or lotion. 7. Wear comfortable walking shoes. No heels!   Follow-Up: Your physician recommends that you schedule a follow-up appointment as needed with Dr. Fletcher Anon.  It was a pleasure seeing you today here in the office. Please do not hesitate to give [place] a call back if you have any further questions. [place], BSN      Any Other Special Instructions Will Be Listed Below (If Applicable).     If you need a refill on your cardiac medications before your next appointment, please call your pharmacy.  Pharmacologic Stress Electrocardiogram A pharmacologic stress electrocardiogram is a heart (cardiac) test that uses nuclear imaging to evaluate the blood supply to your heart. This test may also be called a pharmacologic stress electrocardiography. Pharmacologic means that a medicine is used to increase your heart rate and blood pressure.  This stress test is done to find areas of poor blood flow to the heart by determining the extent of coronary artery disease (CAD). Some people exercise on a treadmill, which naturally increases the blood flow to the heart. For those people unable to exercise on a treadmill, a medicine is used. This medicine stimulates your heart and will cause your heart to beat harder and more quickly, as if you were exercising.  Pharmacologic stress tests can help determine:  The adequacy of blood flow to your heart during increased levels of activity in order to clear you for discharge home.  The extent of  coronary artery blockage caused by CAD.  Your prognosis if you have suffered a heart attack.  The effectiveness of cardiac procedures done, such as an angioplasty, which can increase the circulation in your coronary arteries.  Causes of chest pain or pressure. LET [facility] CARE PROVIDER KNOW ABOUT:  Any allergies you have.  All medicines you are taking, including vitamins,  herbs, eye drops, creams, and over-the-counter medicines.  Previous problems you or members of your family have had with the use of anesthetics.  Any blood disorders you have.  Previous surgeries you have had.  Medical conditions you have.  Possibility of pregnancy, if this applies.  If you are currently breastfeeding. RISKS AND COMPLICATIONS Generally, this is a safe procedure. However, as with any procedure, complications can occur. Possible complications include:  You develop pain or pressure in the following areas:  Chest.  Jaw or neck.  Between your shoulder blades.  Radiating down your left arm.  Headache.  Dizziness or light-headedness.  Shortness of breath.  Increased or irregular heartbeat.  Low blood pressure.  Nausea or vomiting.  Flushing.  Redness going up the arm and slight pain during injection of medicine.  Heart attack (rare). BEFORE THE PROCEDURE   Avoid all forms of caffeine for 24 hours before your test or as directed by your health care provider. This includes coffee, tea (even decaffeinated tea), caffeinated sodas, chocolate, cocoa, and certain pain medicines.  Follow your health care provider's instructions regarding eating and drinking before the test.  Take your medicines as directed at regular times with water unless instructed otherwise. Exceptions may include:  If you have diabetes, ask how you are to take your insulin or pills. It is common to adjust insulin dosing the morning of the test.  If you are taking beta-blocker medicines, it is important to talk to your health care provider about these medicines well before the date of your test. Taking beta-blocker medicines may interfere with the test. In some cases, these medicines need to be changed or stopped 24 hours or more before the test.  If you wear a nitroglycerin patch, it may need to be removed prior to the test. Ask your health care provider if the patch should be removed  before the test.  If you use an inhaler for any breathing condition, bring it with you to the test.  If you are an outpatient, bring a snack so you can eat right after the stress phase of the test.  Do not smoke for 4 hours prior to the test or as directed by your health care provider.  Do not apply lotions, powders, creams, or oils on your chest prior to the test.  Wear comfortable shoes and clothing. Let your health care provider know if you were unable to complete or follow the preparations for your test. PROCEDURE   Multiple patches (electrodes) will be put on your chest. If needed, small areas of your chest may be shaved to get better contact with the electrodes. Once the electrodes are attached to your body, multiple wires will be attached to the electrodes, and your heart rate will be monitored.  An IV access will be started. A nuclear trace (isotope) is given. The isotope may be given intravenously, or it may be swallowed. Nuclear refers to several types of radioactive isotopes, and the nuclear isotope lights up the arteries so that the nuclear images are clear. The isotope is absorbed by your body. This results in low radiation exposure.  A  resting nuclear image is taken to show how your heart functions at rest.  A medicine is given through the IV access.  A second scan is done about 1 hour after the medicine injection and determines how your heart functions under stress.  During this stress phase, you will be connected to an electrocardiogram machine. Your blood pressure and oxygen levels will be monitored. AFTER THE PROCEDURE   Your heart rate and blood pressure will be monitored after the test.  You may return to your normal schedule, including diet,activities, and medicines, unless your health care provider tells you otherwise.   This information is not intended to replace advice given to you by your health care provider. Make sure you discuss any questions you have with  your health care provider.   Document Released: 06/12/2008 Document Revised: 01/29/2013 Document Reviewed: 10/01/2012 Elsevier Interactive Patient Education [employer].

## 2015-09-03 ENCOUNTER — Other Ambulatory Visit: Payer: Self-pay | Admitting: Internal Medicine

## 2015-09-04 ENCOUNTER — Encounter: Payer: Self-pay | Admitting: Internal Medicine

## 2015-09-04 ENCOUNTER — Ambulatory Visit (INDEPENDENT_AMBULATORY_CARE_PROVIDER_SITE_OTHER): Payer: Medicare Other | Admitting: Internal Medicine

## 2015-09-04 VITALS — BP 122/72 | HR 73 | Ht 70.0 in | Wt 198.4 lb

## 2015-09-04 DIAGNOSIS — R42 Dizziness and giddiness: Secondary | ICD-10-CM

## 2015-09-04 DIAGNOSIS — Z1159 Encounter for screening for other viral diseases: Secondary | ICD-10-CM | POA: Diagnosis not present

## 2015-09-04 DIAGNOSIS — H5711 Ocular pain, right eye: Secondary | ICD-10-CM

## 2015-09-04 DIAGNOSIS — H5710 Ocular pain, unspecified eye: Secondary | ICD-10-CM | POA: Insufficient documentation

## 2015-09-04 NOTE — Patient Instructions (Signed)
We will set up MRI brain for further evaluation.  Follow up in 4 weeks.

## 2015-09-04 NOTE — Progress Notes (Signed)
Subjective:    Patient ID: [name]., male    DOB: 1955-09-21, 60 y.o.   MRN: [id number]  HPI  60YO male presents for follow up.  Recently seen with episodic lightheadedness, atypical chest pain and dyspnea. Referred to cardiology. Seen on 7/24. Nuclear stress test ordered.  No recent episodes of lightheadedness. Last episode was in June. However remembers that his mother was diagnosed with brain tumor. Questions if he needs imaging. Reports his orthopedic doc recommended imaging. Feels pressure like sensation in right forehead at times. Not really pain. No visual changes. Sometimes feels that there is something in right eye with some watering of eye. No other focal neurologic symptoms.     Wt Readings from Last 3 Encounters:  09/04/15 198 lb 6.4 oz (90 kg)  08/31/15 198 lb 4 oz (89.9 kg)  08/13/15 196 lb 4.8 oz (89 kg)   BP Readings from Last 3 Encounters:  09/04/15 122/72  08/31/15 130/90  08/13/15 117/77    Past Medical History:  Diagnosis Date  . Benign prostatic hyperplasia with urinary obstruction 08/23/2012  . Benign prostatic hypertrophy with lower urinary tract symptoms (LUTS)   . BPH (benign prostatic hyperplasia) 11/19/2013  . 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 Bulging disc    C2/3, C3/4, C6/7  . Cervical disc herniation    C4/5 and C5/6  . Colon polyp 04/18/2011  . Colon polyps   . Constipation 08/31/2012  . Diverticulosis   . 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 GERD (gastroesophageal reflux disease)   . H/O diverticulitis of colon   . Hemorrhoid   . Hiatal hernia   . Hyperlipidemia   . Infection of the upper respiratory tract 12/29/2011  . Infection of the upper respiratory tract 12/29/2011  . Levoscoliosis   . Routine general medical examination at a health care facility 08/31/2012  . Scoliosis   . Spinal stenosis in cervical region    cord abutment C4/5  . Testicular pain, left   .  Testicular pain, left    Family History  Problem Relation Age of Onset  . Hyperlipidemia Mother   . Cancer Mother     skin cancer?  . Heart disease Mother 29    MI - died in her sleep  . Hyperlipidemia Father   . Heart disease Father   . Kidney disease Neg Hx   . Prostate cancer Neg Hx    Past Surgical History:  Procedure Laterality Date  . GANGLION CYST EXCISION Right 1994   wrist & back  . HERNIA REPAIR Left 1994   abdominal repair with mesh  . NASAL SEPTUM SURGERY  2011   Dr. [name]    Social History   Social History  . Marital status: Married    Spouse name: [name]  . Number of children: 1  . Years of education: GED   Occupational History  . Disability     Herniated disk, spinal stenosis   Social History Main Topics  . Smoking status: Former Smoker    Years: 20.00    Types: Cigarettes    Quit date: 02/08/1991  . Smokeless tobacco: Never Used  . Alcohol use No  . Drug use: No  . Sexual activity: Yes    Partners: Female   Other Topics Concern  . None   Social History Narrative   [name] was born and raised in [place], [place]. He quit school after the 8th  grade because of financial hardship. He was working since age 24. He went back and got his GED. He moved to [place] in 2006. He lives at home with his wife of 3 years and their 32 year old daughter. Previously, Samik was married to his first wife for 20 years and they divorced. They had no children.  Zaven has been disabled 2/2 back problems.      Review of Systems  Constitutional: Positive for fatigue. Negative for activity change, appetite change, chills, fever and unexpected weight change.  Eyes: Positive for pain and discharge. Negative for photophobia and visual disturbance.  Respiratory: Negative for cough and shortness of breath.   Cardiovascular: Negative for chest pain, palpitations and leg swelling.  Gastrointestinal: Negative for abdominal distention, abdominal pain, constipation, diarrhea,  nausea and vomiting.  Genitourinary: Negative for difficulty urinating, dysuria and urgency.  Musculoskeletal: Negative for arthralgias and gait problem.  Skin: Negative for color change and rash.  Neurological: Positive for light-headedness and headaches (pressure). Negative for facial asymmetry, weakness and numbness.  Hematological: Negative for adenopathy.  Psychiatric/Behavioral: Negative for dysphoric mood and sleep disturbance. The patient is not nervous/anxious.        Objective:    BP 122/72 (BP Location: Left Arm, Patient Position: Sitting, Cuff Size: Large)   Pulse 73   Ht 5\' 10"  (1.778 m)   Wt 198 lb 6.4 oz (90 kg)   SpO2 93%   BMI 28.47 kg/m  Physical Exam  Constitutional: He is oriented to person, place, and time. He appears well-developed and well-nourished. No distress.  HENT:  Head: Normocephalic and atraumatic.  Right Ear: External ear normal.  Left Ear: External ear normal.  Nose: Nose normal.  Mouth/Throat: Oropharynx is clear and moist. No oropharyngeal exudate.  Eyes: Conjunctivae and EOM are normal. Pupils are equal, round, and reactive to light. Right eye exhibits no discharge. Left eye exhibits no discharge. No scleral icterus.  Neck: Normal range of motion. Neck supple. No tracheal deviation present. No thyromegaly present.  Cardiovascular: Normal rate, regular rhythm and normal heart sounds.  Exam reveals no gallop and no friction rub.   No murmur heard. Pulmonary/Chest: Effort normal and breath sounds normal. No accessory muscle usage. No tachypnea. No respiratory distress. He has no decreased breath sounds. He has no wheezes. He has no rhonchi. He has no rales. He exhibits no tenderness.  Musculoskeletal: Normal range of motion. He exhibits no edema.  Lymphadenopathy:    He has no cervical adenopathy.  Neurological: He is alert and oriented to person, place, and time. No cranial nerve deficit. Coordination normal.  Skin: Skin is warm and dry. No rash  noted. He is not diaphoretic. No erythema. No pallor.  Psychiatric: He has a normal mood and affect. His behavior is normal. Judgment and thought content normal.          Assessment & Plan:   Problem List Items Addressed This Visit      Unprioritized   Episodic lightheadedness - Primary    Symptoms of right sided headache/pressure and episodic lightheadedness. Cardiac evaluation pending and stress test scheduled. Will set up evaluation with MRI brain. Discussed potential causes of symptoms. Follow up after MRI      Relevant Orders   MR Brain W Wo Contrast   Eye pain    Right eye feeling of irritation, however exam is normal. Will set up referral for ophthalmology evaluation with pressure assessment and dilated exam.      Relevant Orders  Ambulatory referral to Ophthalmology    Other Visit Diagnoses   None.      Return in about 4 weeks (around 10/02/2015) for New Patient.  Ronette Deter, MD [facility]

## 2015-09-04 NOTE — Progress Notes (Signed)
Pre visit review using our clinic review tool, if applicable. No additional management support is needed unless otherwise documented below in the visit note. 

## 2015-09-04 NOTE — Assessment & Plan Note (Signed)
Symptoms of right sided headache/pressure and episodic lightheadedness. Cardiac evaluation pending and stress test scheduled. Will set up evaluation with MRI brain. Discussed potential causes of symptoms. Follow up after MRI

## 2015-09-04 NOTE — Addendum Note (Signed)
Addended by: Ronette Deter A on: 09/04/2015 08:21 AM   Modules accepted: Orders

## 2015-09-04 NOTE — Assessment & Plan Note (Signed)
Right eye feeling of irritation, however exam is normal. Will set up referral for ophthalmology evaluation with pressure assessment and dilated exam.

## 2015-09-05 ENCOUNTER — Ambulatory Visit
Admission: RE | Admit: 2015-09-05 | Discharge: 2015-09-05 | Disposition: A | Discharge status: Home / Self Care | Payer: Medicare Other | Source: Ambulatory Visit | Attending: Internal Medicine | Admitting: Internal Medicine

## 2015-09-05 DIAGNOSIS — R42 Dizziness and giddiness: Secondary | ICD-10-CM

## 2015-09-05 LAB — HEPATITIS C ANTIBODY: HCV Ab: NEGATIVE

## 2015-09-07 ENCOUNTER — Telehealth: Payer: Self-pay | Admitting: Internal Medicine

## 2015-09-07 NOTE — Telephone Encounter (Signed)
Medication has been called in and patient has been notified.

## 2015-09-07 NOTE — Telephone Encounter (Signed)
Pt states that he is willing to try the valium in order to get his MRI done at [facility]. He said that he can get someone to drive him to the appointment. Please let pt know once submitted to [facility].

## 2015-09-07 NOTE — Telephone Encounter (Signed)
Please call in Alprazolam 0.5mg  po x1 taken 1hr prior to MRI. He will need to have someone drive him. May repeat dose x1 if needed. Disp 2 tablets

## 2015-09-08 ENCOUNTER — Encounter
Admission: RE | Admit: 2015-09-08 | Discharge: 2015-09-08 | Disposition: A | Discharge status: Home / Self Care | Payer: Medicare Other | Source: Ambulatory Visit | Attending: Cardiovascular Disease | Admitting: Cardiovascular Disease

## 2015-09-08 DIAGNOSIS — R079 Chest pain, unspecified: Secondary | ICD-10-CM | POA: Insufficient documentation

## 2015-09-08 MED ORDER — TECHNETIUM TC 99M TETROFOSMIN IV KIT
31.8550 | PACK | Freq: Once | INTRAVENOUS | Status: AC | PRN
Start: 2015-09-08 — End: 2015-09-08
  Administered 2015-09-08: 31.855 via INTRAVENOUS

## 2015-09-08 MED ORDER — TECHNETIUM TC 99M TETROFOSMIN IV KIT
13.0000 | PACK | Freq: Once | INTRAVENOUS | Status: AC | PRN
Start: 2015-09-08 — End: 2015-09-08
  Administered 2015-09-08: 11.918 via INTRAVENOUS

## 2015-09-08 MED ORDER — REGADENOSON 0.4 MG/5ML IV SOLN
0.4000 mg | Freq: Once | INTRAVENOUS | Status: AC
  Administered 2015-09-08: 0.4 mg via INTRAVENOUS

## 2015-09-09 LAB — NM MYOCAR MULTI W/SPECT W/WALL MOTION / EF
LV dias vol: 114 mL (ref 62–150)
LV sys vol: 51 mL
Peak HR: 80 {beats}/min
Percent HR: 49 %
Rest HR: 49 {beats}/min
SDS: 0
SRS: 3
SSS: 0
TID: 0.9

## 2015-09-10 ENCOUNTER — Other Ambulatory Visit: Payer: Self-pay

## 2015-09-10 ENCOUNTER — Telehealth: Payer: Self-pay | Admitting: *Deleted

## 2015-09-10 ENCOUNTER — Telehealth: Payer: Self-pay | Admitting: Internal Medicine

## 2015-09-10 DIAGNOSIS — R9430 Abnormal result of cardiovascular function study, unspecified: Secondary | ICD-10-CM

## 2015-09-10 NOTE — Telephone Encounter (Signed)
Patient notified

## 2015-09-10 NOTE — Telephone Encounter (Signed)
Patient requested stress test results  Pt contact [phone]

## 2015-09-10 NOTE — Telephone Encounter (Signed)
Can you please notify patient that he needs to contact cardiology for results, thanks

## 2015-09-10 NOTE — Telephone Encounter (Signed)
Please advise, I believe cardiology order this test, thanks.

## 2015-09-10 NOTE — Telephone Encounter (Signed)
I don't see results in chart yet. Yes, he needs to contact cardiology

## 2015-09-10 NOTE — Telephone Encounter (Signed)
This is a duplicate note, thanks

## 2015-09-10 NOTE — Telephone Encounter (Signed)
Pt called about his test results. Please call him.

## 2015-09-16 DIAGNOSIS — H0259 Other disorders affecting eyelid function: Secondary | ICD-10-CM | POA: Diagnosis not present

## 2015-09-17 ENCOUNTER — Telehealth: Payer: Self-pay | Admitting: *Deleted

## 2015-09-17 NOTE — Telephone Encounter (Signed)
Patient requested a call to clarify how to take his valium before his scan.  Pt contact [phone]

## 2015-09-17 NOTE — Telephone Encounter (Signed)
Reviewed plan with Patient.

## 2015-09-18 ENCOUNTER — Other Ambulatory Visit: Payer: Self-pay

## 2015-09-18 NOTE — Telephone Encounter (Signed)
Refill request for Flexiril, last seen [id number], last filled [id number].  Please advise.

## 2015-09-19 ENCOUNTER — Ambulatory Visit
Admission: RE | Admit: 2015-09-19 | Discharge: 2015-09-19 | Disposition: A | Discharge status: Home / Self Care | Payer: Medicare Other | Source: Ambulatory Visit | Attending: Internal Medicine | Admitting: Internal Medicine

## 2015-09-19 ENCOUNTER — Encounter: Payer: Self-pay | Admitting: Internal Medicine

## 2015-09-19 DIAGNOSIS — R42 Dizziness and giddiness: Secondary | ICD-10-CM | POA: Diagnosis not present

## 2015-09-19 MED ORDER — GADOBENATE DIMEGLUMINE 529 MG/ML IV SOLN
18.0000 mL | Freq: Once | INTRAVENOUS | Status: AC | PRN
  Administered 2015-09-19: 18 mL via INTRAVENOUS

## 2015-09-21 ENCOUNTER — Other Ambulatory Visit: Payer: Self-pay

## 2015-09-21 MED ORDER — CYCLOBENZAPRINE HCL 10 MG PO TABS: ORAL_TABLET | ORAL | 0 refills | Status: DC

## 2015-09-21 NOTE — Telephone Encounter (Signed)
Spoke with the patient, he has a herinated disc in neck and back, doesn't take every day, which is why the refill hasn't been needed prior to now. thanks

## 2015-09-21 NOTE — Telephone Encounter (Signed)
Please advise refill thanks. Appt coming up on Sept.

## 2015-09-21 NOTE — Telephone Encounter (Signed)
Sent to pharmacy 

## 2015-09-21 NOTE — Telephone Encounter (Signed)
Forwarding to you as appears he establishes with you in September  [name], will you change pcp to eric?

## 2015-09-21 NOTE — Telephone Encounter (Signed)
Please determine what the patient is taking this medication for and then I will refill. Thanks.

## 2015-09-28 ENCOUNTER — Other Ambulatory Visit: Payer: Self-pay

## 2015-09-28 MED ORDER — TADALAFIL 10 MG PO TABS: 10.0000 mg | ORAL_TABLET | ORAL | 3 refills | Status: DC | PRN

## 2015-09-28 NOTE — Telephone Encounter (Signed)
Medication refill

## 2015-09-30 ENCOUNTER — Ambulatory Visit (INDEPENDENT_AMBULATORY_CARE_PROVIDER_SITE_OTHER): Payer: Medicare Other

## 2015-09-30 ENCOUNTER — Other Ambulatory Visit: Payer: Self-pay

## 2015-09-30 DIAGNOSIS — R0989 Other specified symptoms and signs involving the circulatory and respiratory systems: Secondary | ICD-10-CM

## 2015-09-30 DIAGNOSIS — R9430 Abnormal result of cardiovascular function study, unspecified: Secondary | ICD-10-CM

## 2015-10-01 ENCOUNTER — Other Ambulatory Visit: Payer: Self-pay | Admitting: Surgical

## 2015-10-01 MED ORDER — PANTOPRAZOLE SODIUM 40 MG PO TBEC: 40.0000 mg | DELAYED_RELEASE_TABLET | Freq: Every day | ORAL | 1 refills | Status: DC

## 2015-10-05 ENCOUNTER — Other Ambulatory Visit: Payer: Self-pay | Admitting: Surgical

## 2015-10-05 MED ORDER — ESCITALOPRAM OXALATE 10 MG PO TABS: 10.0000 mg | ORAL_TABLET | Freq: Every day | ORAL | 0 refills | Status: DC

## 2015-10-22 ENCOUNTER — Ambulatory Visit (INDEPENDENT_AMBULATORY_CARE_PROVIDER_SITE_OTHER): Payer: Medicare Other | Admitting: Cardiovascular Disease

## 2015-10-22 ENCOUNTER — Encounter: Payer: Self-pay | Admitting: Cardiovascular Disease

## 2015-10-22 VITALS — BP 128/80 | HR 69 | Ht 70.0 in | Wt 196.8 lb

## 2015-10-22 DIAGNOSIS — E785 Hyperlipidemia, unspecified: Secondary | ICD-10-CM | POA: Diagnosis not present

## 2015-10-22 DIAGNOSIS — R079 Chest pain, unspecified: Secondary | ICD-10-CM | POA: Diagnosis not present

## 2015-10-22 NOTE — Patient Instructions (Signed)
Medication Instructions: No changes.   Labwork: None.   Procedures/Testing: None.   Follow-Up: As needed with Dr. Arida.   Any Additional Special Instructions Will Be Listed Below (If Applicable).     If you need a refill on your cardiac medications before your next appointment, please call your pharmacy.   

## 2015-10-22 NOTE — Progress Notes (Signed)
Cardiology Office Note   Date:  10/22/2015   ID:  Tyler Loop., DOB Jun 25, 1955, MRN [id number]  PCP:  [name], MD  Cardiologist:   Kathlyn Sacramento, MD   Chief Complaint  Patient presents with  . other    Follow up from [facility]. Meds reviewed by the patient verbally. "doing well."       History of Present Illness: Tyler Smathers. is a 60 y.o. male who is here today for follow-up visit regardingAtypical chest pain, dizziness and shortness of breath.  He has known history of hyperlipidemia but no diabetes or hypertension. He quit smoking in 1993 and does not drink alcohol. He suffers from chronic back and neck pain and thus is not able to exercise or be active physically. He underwent a pharmacologic nuclear stress test which showed no evidence of ischemia. Ejection fraction determination was not accurate. I proceeded with an echocardiogram which showed an EF of 50-55% with no significant valvular abnormalities. He continues to complain of headache and poor quality sleep. He reports taking alprazolam with significant improvement in his symptoms. He feels that he does not get good quality sleep and is not aware of sleep apnea.   Past Medical History:  Diagnosis Date  . Benign prostatic hyperplasia with urinary obstruction 08/23/2012  . Benign prostatic hypertrophy with lower urinary tract symptoms (LUTS)   . BPH (benign prostatic hyperplasia) 11/19/2013  . 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 Bulging disc    C2/3, C3/4, C6/7  . Cervical disc herniation    C4/5 and C5/6  . Colon polyp 04/18/2011  . Colon polyps   . Constipation 08/31/2012  . Diverticulosis   . 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 GERD (gastroesophageal reflux disease)   . H/O diverticulitis of colon   . Hemorrhoid   . Hiatal hernia   . Hyperlipidemia   . Infection of the upper respiratory tract 12/29/2011  . Infection of the upper respiratory  tract 12/29/2011  . Levoscoliosis   . Routine general medical examination at a health care facility 08/31/2012  . Scoliosis   . Spinal stenosis in cervical region    cord abutment C4/5  . Testicular pain, left   . Testicular pain, left     Past Surgical History:  Procedure Laterality Date  . GANGLION CYST EXCISION Right 1994   wrist & back  . HERNIA REPAIR Left 1994   abdominal repair with mesh  . NASAL SEPTUM SURGERY  2011   Dr. [name]      Current Outpatient Prescriptions  Medication Sig Dispense Refill  . atorvastatin (LIPITOR) 10 MG tablet TAKE 1 TABLET BY MOUTH EVERY EVENING AT 6 PM 90 tablet 0  . cyclobenzaprine (FLEXERIL) 10 MG tablet TAKE 1 TABLET(10 MG) BY MOUTH THREE TIMES DAILY AS NEEDED FOR MUSCLE SPASMS 30 tablet 0  . escitalopram (LEXAPRO) 10 MG tablet Take 1 tablet (10 mg total) by mouth daily. 90 tablet 0  . meloxicam (MOBIC) 15 MG tablet TAKE 1 TABLET BY MOUTH DAILY 30 tablet 0  . mirabegron ER (MYRBETRIQ) 50 MG TB24 tablet Take 50 mg by mouth daily.    [name] NASACORT ALLERGY 24HR 55 MCG/ACT AERO nasal inhaler USE 2 SPRAYS IN EACH NOSTRIL DAILY 16.5 mL 5  . pantoprazole (PROTONIX) 40 MG tablet Take 1 tablet (40 mg total) by mouth daily. 90 tablet 1  . Probiotic Product (ALIGN) 4 MG CAPS  Take 1 capsule by mouth daily 30 capsule 3  . tadalafil (CIALIS) 10 MG tablet Take 1 tablet (10 mg total) by mouth as needed. 30 tablet 3   No current facility-administered medications for this visit.     Allergies:   Other    Social History:  The patient  reports that he quit smoking about 24 years ago. His smoking use included Cigarettes. He quit after 20.00 years of use. He has never used smokeless tobacco. He reports that he does not drink alcohol or use drugs.   Family History:  The patient's family history includes Cancer in his mother; Heart disease in his father; Heart disease (age of onset: 62) in his mother; Hyperlipidemia in his father and mother.    ROS:  Please see  the history of present illness.   Otherwise, review of systems are positive for none.   All other systems are reviewed and negative.    PHYSICAL EXAM: VS:  BP 128/80 (BP Location: Left Arm, Patient Position: Sitting, Cuff Size: Normal)   Pulse 69   Ht 5\' 10"  (1.778 m)   Wt 196 lb 12 oz (89.2 kg)   BMI 28.23 kg/m  , BMI Body mass index is 28.23 kg/m. GEN: Well nourished, well developed, in no acute distress  HEENT: normal  Neck: no JVD, carotid bruits, or masses Cardiac: RRR; no murmurs, rubs, or gallops,no edema  Respiratory:  clear to auscultation bilaterally, normal work of breathing GI: soft, nontender, nondistended, + BS MS: no deformity or atrophy  Skin: warm and dry, no rash Neuro:  Strength and sensation are intact Psych: euthymic mood, full affect   EKG:  EKG is ordered today. The ekg ordered today demonstrates normal sinus rhythm with no significant ST or T wave changes.   Recent Labs: 06/12/2015: ALT 13; BUN 17; Creatinine, Ser 1.00; Potassium 4.5; Sodium 140    Lipid Panel    Component Value Date/Time   CHOL 166 06/12/2015 0820   TRIG 78.0 06/12/2015 0820   HDL 44.60 06/12/2015 0820   CHOLHDL 4 06/12/2015 0820   VLDL 15.6 06/12/2015 0820   LDLCALC 106 (H) 06/12/2015 0820      Wt Readings from Last 3 Encounters:  10/22/15 196 lb 12 oz (89.2 kg)  09/04/15 198 lb 6.4 oz (90 kg)  08/31/15 198 lb 4 oz (89.9 kg)        ASSESSMENT AND PLAN:  1.  Atypical chest pain/Shortness of breath.  Negative cardiac workup including stress test and echocardiogram. Some of his symptoms are suggestive of GERD with subsequent improvement with the PPI.  2. Dizziness and headache: The etiology is not entirely clear. Brain MRI was unremarkable. Symptoms are slightly better.   3. Hyperlipidemia: Currently on atorvastatin with most recent LDL of 106.   4. Insomnia: There might be a stress component. Given his daytime somnolence, consider screening for sleep apnea. The patient  reported significant improvement with alprazolam. He is going to follow-up with Dr. Caryl Bis as a new patient to discuss these issues.    Disposition:   FU with me as needed.   Signed,  Kathlyn Sacramento, MD  10/22/2015 8:48 AM    [name]

## 2015-10-27 ENCOUNTER — Ambulatory Visit (INDEPENDENT_AMBULATORY_CARE_PROVIDER_SITE_OTHER): Payer: Medicare Other | Admitting: Family Medicine

## 2015-10-27 ENCOUNTER — Encounter: Payer: Self-pay | Admitting: Family Medicine

## 2015-10-27 DIAGNOSIS — N3281 Overactive bladder: Secondary | ICD-10-CM

## 2015-10-27 DIAGNOSIS — F4323 Adjustment disorder with mixed anxiety and depressed mood: Secondary | ICD-10-CM

## 2015-10-27 MED ORDER — ESCITALOPRAM OXALATE 20 MG PO TABS: 20.0000 mg | ORAL_TABLET | Freq: Every day | ORAL | 3 refills | Status: DC

## 2015-10-27 MED ORDER — ALPRAZOLAM 0.5 MG PO TABS: 0.5000 mg | ORAL_TABLET | Freq: Every evening | ORAL | 0 refills | Status: DC | PRN

## 2015-10-27 NOTE — Patient Instructions (Signed)
Nice to see you. I think you're underlying sleep issue is related to depression and anxiety. Also to some extent related to your urinary issues. We will increase your Lexapro. We'll start you on Xanax as needed at night for anxiety. Be wary of taking this as it can make you drowsy. Please do not drive after taking this. Do not take more than the prescribed dose. Please keep your follow-up with your urologist. If you develop oats of harming herself please seek medical attention immediately.

## 2015-10-27 NOTE — Assessment & Plan Note (Signed)
Followed by urology for this. Currently on Myrbetriq and Cialis. I suspect this is contributing somewhat to his sleep issues as it is waking him up during the night. Advised that he discuss this further at follow-up with his urologist

## 2015-10-27 NOTE — Assessment & Plan Note (Signed)
This issue is worsened. Patient with a fair amount of anxiety keeping him up at night also with some depression. No SI. Discussed that his sleep issues are likely multifactorial related to anxiety and depression and also to his urinary issues. He will follow-up with urology for the urinary issues. Seems to be more related to these issues as opposed to a sleep apnea of which he has no snoring or apneic episodes. We will increase his Lexapro to 20 mg daily to see if this will benefit his anxiety and depression and help with his sleep. We will provide a one time short-term prescription for Xanax to help with anxiety prior to sleep. He was counseled on the risks of this medication. Advised not to drive while taking this medication. Given return precautions.

## 2015-10-27 NOTE — Progress Notes (Signed)
[name], MD Phone: [phone]  [name]. is a 60 y.o. male who presents today for follow-up.  Patient notes issues with anxiety and depression. Feels anxious and can't turn his mind off at night. Has trouble sleeping throughout the night due to waking up to urinate as well. Once he wakes up he has trouble turning his mind back off again. Typically goes to bed around 9 PM and can take 30 minutes up to an hour to fall asleep. He'll wake up several times in the middle the night and it takes him a long time to fall back asleep as well. No snoring or apneic episodes reported. Does feel sleepy during the day though does not fall asleep in front of the TV or in the car. Drinks 1-2 caffeinated beverages a day. No alcohol. Was previously on Ambien for sleep and this did not help. He had Xanax from a imaging study that he has taken on 1-2 occasions to help sleep and this was beneficial. He is on Myrbetriq and Cialis for overactive bladder and BPH. Notes these help. He urinates several times a night. If he is not on the medicines he ends up straining and urinating more frequent. Follows with urology. Sees them next month.  PMH: Former smoker   ROS see history of present illness  Objective  Physical Exam Vitals:   10/27/15 0803  BP: 120/70  Pulse: 62    BP Readings from Last 3 Encounters:  10/27/15 120/70  10/22/15 128/80  09/04/15 122/72   Wt Readings from Last 3 Encounters:  10/27/15 198 lb (89.8 kg)  10/22/15 196 lb 12 oz (89.2 kg)  09/04/15 198 lb 6.4 oz (90 kg)    Physical Exam  Constitutional: No distress.  HENT:  Head: Normocephalic and atraumatic.  Cardiovascular: Normal rate, regular rhythm and normal heart sounds.   Pulmonary/Chest: Effort normal and breath sounds normal.  Neurological: He is alert. Gait normal.  Skin: Skin is warm and dry. He is not diaphoretic.  Psychiatric:  Mood anxious, affect anxious     Assessment/Plan: Please see individual  problem list.  OAB (overactive bladder) Followed by urology for this. Currently on Myrbetriq and Cialis. I suspect this is contributing somewhat to his sleep issues as it is waking him up during the night. Advised that he discuss this further at follow-up with his urologist  Adjustment disorder with mixed anxiety and depressed mood This issue is worsened. Patient with a fair amount of anxiety keeping him up at night also with some depression. No SI. Discussed that his sleep issues are likely multifactorial related to anxiety and depression and also to his urinary issues. He will follow-up with urology for the urinary issues. Seems to be more related to these issues as opposed to a sleep apnea of which he has no snoring or apneic episodes. We will increase his Lexapro to 20 mg daily to see if this will benefit his anxiety and depression and help with his sleep. We will provide a one time short-term prescription for Xanax to help with anxiety prior to sleep. He was counseled on the risks of this medication. Advised not to drive while taking this medication. Given return precautions.   No orders of the defined types were placed in this encounter.   Meds ordered this encounter  Medications  . escitalopram (LEXAPRO) 20 MG tablet    Sig: Take 1 tablet (20 mg total) by mouth daily.    Dispense:  90 tablet  Refill:  3  . ALPRAZolam (XANAX) 0.5 MG tablet    Sig: Take 1 tablet (0.5 mg total) by mouth at bedtime as needed for anxiety.    Dispense:  20 tablet    Refill:  0    [name], MD [facility]

## 2015-11-08 DIAGNOSIS — I483 Typical atrial flutter: Secondary | ICD-10-CM

## 2015-11-08 HISTORY — DX: Typical atrial flutter: I48.3

## 2015-11-13 ENCOUNTER — Ambulatory Visit: Payer: Medicare Other | Admitting: Urology

## 2015-11-23 ENCOUNTER — Ambulatory Visit (INDEPENDENT_AMBULATORY_CARE_PROVIDER_SITE_OTHER): Payer: Medicare Other | Admitting: Urology

## 2015-11-23 ENCOUNTER — Encounter: Payer: Self-pay | Admitting: Urology

## 2015-11-23 VITALS — BP 128/87 | HR 80 | Ht 70.0 in | Wt 196.9 lb

## 2015-11-23 DIAGNOSIS — M6289 Other specified disorders of muscle: Secondary | ICD-10-CM | POA: Diagnosis not present

## 2015-11-23 DIAGNOSIS — N400 Enlarged prostate without lower urinary tract symptoms: Secondary | ICD-10-CM

## 2015-11-23 DIAGNOSIS — K5900 Constipation, unspecified: Secondary | ICD-10-CM | POA: Diagnosis not present

## 2015-11-23 DIAGNOSIS — N3281 Overactive bladder: Secondary | ICD-10-CM | POA: Diagnosis not present

## 2015-11-23 LAB — BLADDER SCAN AMB NON-IMAGING: Scan Result: 49

## 2015-11-23 NOTE — Progress Notes (Signed)
8:47 AM   [name]. [birth year] [id number]  Referring provider: [name], MD No address on file  Chief Complaint  Patient presents with  . Benign Prostatic Hypertrophy    3 month follow up  . Over Active Bladder    HPI: Patient is a 60 year old WM with a history of BPH, OAB, nocturia and constipation currently on Cialis 5 mg and Myrbetriq 50 mg who presents today for a 3 month follow up.  His IPSS score today is 21, which is moderate lower urinary tract symptomatology. He is mostly dissatisfied with his quality life due to his urinary symptoms. His PVR is 49 mL.  His previous IPSS score is 21/4.  His previous PVR is 89 mL.   His major complaint today is urinary frequency, intermittency, hesitancy and nocturia.  He has had these symptoms for many  years.  He states the intermittency is improved with Cialis 5 mg daily.  He denies any dysuria, hematuria or suprapubic pain.   He currently taking Cialis 5 mg daily and Myrbetriq 50 mg.  He also denies any recent fevers, chills, nausea or vomiting.  He does not have a family history of PCa.     IPSS    Row Name 11/23/15 0800         International Prostate Symptom Score   How often have you had the sensation of not emptying your bladder? About half the time     How often have you had to urinate less than every two hours? More than half the time     How often have you found you stopped and started again several times when you urinated? Less than half the time     How often have you found it difficult to postpone urination? About half the time     How often have you had a weak urinary stream? About half the time     How often have you had to strain to start urination? Less than half the time     How many times did you typically get up at night to urinate? 4 Times     Total IPSS Score 21       Quality of Life due to urinary symptoms   If you were to spend the rest of your life with your urinary condition just the way  it is now how would you feel about that? Mostly Disatisfied        Score:  1-7 Mild 8-19 Moderate 20-35 Severe     PMH: Past Medical History:  Diagnosis Date  . Benign prostatic hyperplasia with urinary obstruction 08/23/2012  . Benign prostatic hypertrophy with lower urinary tract symptoms (LUTS)   . BPH (benign prostatic hyperplasia) 11/19/2013  . 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 Bulging disc    C2/3, C3/4, C6/7  . Cervical disc herniation    C4/5 and C5/6  . Colon polyp 04/18/2011  . Colon polyps   . Constipation 08/31/2012  . Diverticulosis   . 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 GERD (gastroesophageal reflux disease)   . H/O diverticulitis of colon   . Hemorrhoid   . Hiatal hernia   . Hyperlipidemia   . Infection of the upper respiratory tract 12/29/2011  . Infection of the upper respiratory tract 12/29/2011  . Levoscoliosis   . Routine general medical examination at a health care facility 08/31/2012  . Scoliosis   .  Spinal stenosis in cervical region    cord abutment C4/5  . Testicular pain, left   . Testicular pain, left     Surgical History: Past Surgical History:  Procedure Laterality Date  . GANGLION CYST EXCISION Right 1994   wrist & back  . HERNIA REPAIR Left 1994   abdominal repair with mesh  . NASAL SEPTUM SURGERY  2011   Dr. [name]     Home Medications:    Medication List       Accurate as of 11/23/15  8:47 AM. Always use your most recent med list.          ALIGN 4 MG Caps Take 1 capsule by mouth daily   ALPRAZolam 0.5 MG tablet Commonly known as:  XANAX Take 1 tablet (0.5 mg total) by mouth at bedtime as needed for anxiety.   atorvastatin 10 MG tablet Commonly known as:  LIPITOR TAKE 1 TABLET BY MOUTH EVERY EVENING AT 6 PM   cyclobenzaprine 10 MG tablet Commonly known as:  FLEXERIL TAKE 1 TABLET(10 MG) BY MOUTH THREE TIMES DAILY AS NEEDED FOR MUSCLE SPASMS   escitalopram  20 MG tablet Commonly known as:  LEXAPRO Take 1 tablet (20 mg total) by mouth daily.   meloxicam 15 MG tablet Commonly known as:  MOBIC TAKE 1 TABLET BY MOUTH DAILY   MYRBETRIQ 50 MG Tb24 tablet Generic drug:  mirabegron ER Take 50 mg by mouth daily.   NASACORT ALLERGY 24HR 55 MCG/ACT Aero nasal inhaler Generic drug:  triamcinolone USE 2 SPRAYS IN EACH NOSTRIL DAILY   pantoprazole 40 MG tablet Commonly known as:  PROTONIX Take 1 tablet (40 mg total) by mouth daily.   tadalafil 10 MG tablet Commonly known as:  CIALIS Take 1 tablet (10 mg total) by mouth as needed.       Allergies:  Allergies  Allergen Reactions  . Other Other (See Comments)    Pollen:  Sinus infection, cough, fatigue    Family History: Family History  Problem Relation Age of Onset  . Hyperlipidemia Mother   . Cancer Mother     skin cancer?  . Heart disease Mother 84    MI - died in her sleep  . Hyperlipidemia Father   . Heart disease Father   . Kidney disease Neg Hx   . Prostate cancer Neg Hx     Social History:  reports that he quit smoking about 24 years ago. His smoking use included Cigarettes. He quit after 20.00 years of use. He has never used smokeless tobacco. He reports that he does not drink alcohol or use drugs.  ROS: UROLOGY Frequent Urination?: Yes Hard to postpone urination?: No Burning/pain with urination?: No Get up at night to urinate?: Yes Leakage of urine?: No Urine stream starts and stops?: Yes Trouble starting stream?: Yes Do you have to strain to urinate?: No Blood in urine?: No Urinary tract infection?: No Sexually transmitted disease?: No Injury to kidneys or bladder?: No Painful intercourse?: No Weak stream?: No Erection problems?: No Penile pain?: No  Gastrointestinal Nausea?: No Vomiting?: No Indigestion/heartburn?: Yes Diarrhea?: No Constipation?: Yes  Constitutional Fever: No Night sweats?: No Weight loss?: No Fatigue?: Yes  Skin Skin  rash/lesions?: No Itching?: No  Eyes Blurred vision?: No Double vision?: No  Ears/Nose/Throat Sore throat?: No Sinus problems?: Yes  Hematologic/Lymphatic Swollen glands?: No Easy bruising?: No  Cardiovascular Leg swelling?: No Chest pain?: No  Respiratory Cough?: Yes Shortness of breath?: No  Endocrine Excessive thirst?: No  Musculoskeletal Back pain?: No Joint pain?: No  Neurological Headaches?: No Dizziness?: No  Psychologic Depression?: Yes Anxiety?: Yes  Physical Exam: BP 128/87   Pulse 80   Ht 5\' 10"  (1.778 m)   Wt 196 lb 14.4 oz (89.3 kg)   BMI 28.25 kg/m   Constitutional: Well nourished. Alert and oriented, No acute distress. HEENT: [place] AT, moist mucus membranes. Trachea midline, no masses. Cardiovascular: No clubbing, cyanosis, or edema. Respiratory: Normal respiratory effort, no increased work of breathing. GI: Abdomen is soft, non tender, non distended, no abdominal masses. Liver and spleen not palpable.  No hernias appreciated.  Stool sample for occult testing is not indicated.   GU: No CVA tenderness.  No bladder fullness or masses.  Patient with circumcised phallus.   Urethral meatus is patent.  No penile discharge. No penile lesions or rashes. Scrotum without lesions, cysts, rashes and/or edema.  Testicles are located scrotally bilaterally. No masses are appreciated in the testicles. Left and right epididymis are normal. Rectal: Patient with  normal sphincter tone. Anus and perineum without scarring or rashes. No rectal masses are appreciated. Prostate is approximately 55 grams, no nodules are appreciated.  Seminal vesicles are normal. Skin: No rashes, bruises or suspicious lesions. Lymph: No cervical or inguinal adenopathy. Neurologic: Grossly intact, no focal deficits, moving all 4 extremities. Psychiatric: Normal mood and affect.  Laboratory Data: Lab Results  Component Value Date   WBC 5.3 06/13/2014   HGB 14.8 06/13/2014   HCT 43.5  06/13/2014   MCV 87.3 06/13/2014   PLT 200.0 06/13/2014    Lab Results  Component Value Date   CREATININE 1.00 06/12/2015    Lab Results  Component Value Date   PSA 3.49 11/19/2013  PSA                             3.6 ng/mL                                                       11/24/2014   Lab Results  Component Value Date   TSH 0.72 06/13/2014       Component Value Date/Time   CHOL 166 06/12/2015 0820   HDL 44.60 06/12/2015 0820   CHOLHDL 4 06/12/2015 0820   VLDL 15.6 06/12/2015 0820   LDLCALC 106 (H) 06/12/2015 0820    Lab Results  Component Value Date   AST 16 06/12/2015   Lab Results  Component Value Date   ALT 13 06/12/2015    Pertinent Imaging: Results for Tyler, Ayala (MRN [id number]) as of 11/23/2015 08:34  Ref. Range 11/23/2015 08:29  Scan Result Unknown 49   Assessment & Plan:    1. BPH (benign prostatic hyperplasia) with LUTS:      - IPSS score is 21/4, it is stable  - Continue the Cialis 5 mg daily, no refills needed at this time  - RTC in 12 months for IPSS, PSA and PVR   - Bladder Scan (Post Void Residual) in office  - PSA  2. OAB  - Continue the Myrbetriq 50 mg, no refills needed at this time  - He will return in 12 months for I PSS score and PVR  3. Constipation  - Currently managed with over-the-counter stool softeners and laxatives.  4. Pelvic floor dysfunction  - I feel the patient would benefit from PT  - referred the patient for PT   Return in about 1 year (around 11/22/2016) for PSA, IPSS, PVR and exam.  These notes generated with voice recognition software. I apologize for typographical errors.  Zara Council, [facility] [address] [place], [place] [address] [phone]

## 2015-11-24 ENCOUNTER — Telehealth: Payer: Self-pay

## 2015-11-24 DIAGNOSIS — R972 Elevated prostate specific antigen [PSA]: Secondary | ICD-10-CM

## 2015-11-24 LAB — PSA: Prostate Specific Ag, Serum: 4.8 ng/mL — ABNORMAL HIGH (ref 0.0–4.0)

## 2015-11-24 NOTE — Telephone Encounter (Signed)
LMOM Labs were added.

## 2015-11-24 NOTE — Telephone Encounter (Signed)
-----   Message from [name], [name] sent at 11/24/2015  8:15 AM EDT ----- Patient's PSA has risen greater than 0.75 ng/mL on one year.  This is outside the acceptable limits.  I would like the patient to start finasteride 5 mg daily and would you please add a free and total PSA to his blood work.

## 2015-11-25 MED ORDER — FINASTERIDE 5 MG PO TABS: 5.0000 mg | ORAL_TABLET | Freq: Every day | ORAL | 3 refills | Status: DC

## 2015-11-25 NOTE — Telephone Encounter (Signed)
Spoke with pt in reference to PSA results and needing labs drawn. Pt voiced understanding. Lab appt made and orders placed.

## 2015-11-25 NOTE — Telephone Encounter (Signed)
Spoke with pt in reference to PSA and finasteride. Pt voiced understanding. Medication sent to pharmacy. When should pt have PSA redrawn?

## 2015-11-25 NOTE — Telephone Encounter (Signed)
-----   Message from [name], [name] sent at 11/25/2015 11:47 AM EDT ----- Patient's free and total PSA notes a 10% probability of prostate cancer.  I would have him continue the finasteride and recheck in one month.

## 2015-11-26 ENCOUNTER — Telehealth: Payer: Self-pay | Admitting: *Deleted

## 2015-11-26 DIAGNOSIS — F329 Major depressive disorder, single episode, unspecified: Secondary | ICD-10-CM

## 2015-11-26 DIAGNOSIS — F32A Depression, unspecified: Secondary | ICD-10-CM

## 2015-11-26 DIAGNOSIS — F419 Anxiety disorder, unspecified: Principal | ICD-10-CM

## 2015-11-26 NOTE — Telephone Encounter (Signed)
Please place referral

## 2015-11-26 NOTE — Telephone Encounter (Signed)
Patient stated that he would like to be referred to someone to del with his depression. He was seen in the office on 11/16, per patient he discussed his depression with Dr.Sonnenberg.  Pt contact [phone]

## 2015-11-26 NOTE — Telephone Encounter (Signed)
I'll place referral to psychology.

## 2015-11-27 ENCOUNTER — Telehealth: Payer: Self-pay | Admitting: Family Medicine

## 2015-11-27 LAB — SPECIMEN STATUS REPORT

## 2015-11-27 LAB — PSA, TOTAL AND FREE
PSA, Free Pct: 21.8 %
PSA, Free: 0.96 ng/mL
Prostate Specific Ag, Serum: 4.4 ng/mL — ABNORMAL HIGH (ref 0.0–4.0)

## 2015-11-27 NOTE — Telephone Encounter (Signed)
Pt's wife states that husband suffering from depression. She would like her husband to have a referral for therapy.

## 2015-11-27 NOTE — Telephone Encounter (Signed)
Spoke with patients wife and she stated that she was very concerned about her husband. He has been very depressed and has talked about hurting himself. See her chart for more information. I called the patient and ask him if I could talk to him about his depression. He said that was fine. He stated that he was in the car with his wife and daughter. I ask him if I could ask him questions that he could answer yes or no to. He said yes. I ask him if his depression had gotten so bad that he wanted to hurt himself? He said no. He said that he has been dealing with this for a while and hopefully when he talks with someone about it that it will get better. I ask him if he had mentioned to anyone that he wanted to hurt himself and he said no. I advised him that if he starts having thoughts of hurting himself that he needs to go to the ED immediately. If he does not want to go to th ED he should go to an urgent care and seek medical advise. Patient verbalized understanding and said that he would.

## 2015-11-27 NOTE — Telephone Encounter (Signed)
Noted. I was standing beside CMA during the conversation with this patient. She additionally asked if he had any intent or plan to harm himself and he stated no. I agree with her documentation and advised her to tell him that if he develops any thoughts of harming  himself he needs to seek medical attention immediately.

## 2015-11-27 NOTE — Telephone Encounter (Signed)
Notified patient that referral has been placed. 

## 2015-11-29 ENCOUNTER — Inpatient Hospital Stay
Admission: EM | Admit: 2015-11-29 | Discharge: 2015-12-01 | DRG: 310 | Disposition: A | Discharge status: Home / Self Care | Payer: Medicare Other | Attending: Internal Medicine | Admitting: Internal Medicine

## 2015-11-29 ENCOUNTER — Emergency Department: Payer: Medicare Other

## 2015-11-29 ENCOUNTER — Encounter: Payer: Self-pay | Admitting: Emergency Medicine

## 2015-11-29 DIAGNOSIS — Z8249 Family history of ischemic heart disease and other diseases of the circulatory system: Secondary | ICD-10-CM | POA: Diagnosis not present

## 2015-11-29 DIAGNOSIS — Z7189 Other specified counseling: Secondary | ICD-10-CM | POA: Diagnosis not present

## 2015-11-29 DIAGNOSIS — R0602 Shortness of breath: Secondary | ICD-10-CM | POA: Diagnosis not present

## 2015-11-29 DIAGNOSIS — F329 Major depressive disorder, single episode, unspecified: Secondary | ICD-10-CM | POA: Diagnosis present

## 2015-11-29 DIAGNOSIS — N401 Enlarged prostate with lower urinary tract symptoms: Secondary | ICD-10-CM | POA: Diagnosis present

## 2015-11-29 DIAGNOSIS — G8929 Other chronic pain: Secondary | ICD-10-CM

## 2015-11-29 DIAGNOSIS — R05 Cough: Secondary | ICD-10-CM | POA: Diagnosis not present

## 2015-11-29 DIAGNOSIS — K219 Gastro-esophageal reflux disease without esophagitis: Secondary | ICD-10-CM | POA: Diagnosis not present

## 2015-11-29 DIAGNOSIS — R0600 Dyspnea, unspecified: Secondary | ICD-10-CM

## 2015-11-29 DIAGNOSIS — M542 Cervicalgia: Secondary | ICD-10-CM | POA: Diagnosis not present

## 2015-11-29 DIAGNOSIS — Z809 Family history of malignant neoplasm, unspecified: Secondary | ICD-10-CM

## 2015-11-29 DIAGNOSIS — Z791 Long term (current) use of non-steroidal anti-inflammatories (NSAID): Secondary | ICD-10-CM

## 2015-11-29 DIAGNOSIS — I4892 Unspecified atrial flutter: Secondary | ICD-10-CM | POA: Diagnosis present

## 2015-11-29 DIAGNOSIS — I480 Paroxysmal atrial fibrillation: Secondary | ICD-10-CM | POA: Diagnosis not present

## 2015-11-29 DIAGNOSIS — Z87891 Personal history of nicotine dependence: Secondary | ICD-10-CM

## 2015-11-29 DIAGNOSIS — M4802 Spinal stenosis, cervical region: Secondary | ICD-10-CM | POA: Diagnosis present

## 2015-11-29 DIAGNOSIS — E785 Hyperlipidemia, unspecified: Secondary | ICD-10-CM | POA: Diagnosis present

## 2015-11-29 DIAGNOSIS — I483 Typical atrial flutter: Principal | ICD-10-CM | POA: Diagnosis present

## 2015-11-29 DIAGNOSIS — M419 Scoliosis, unspecified: Secondary | ICD-10-CM | POA: Diagnosis present

## 2015-11-29 DIAGNOSIS — Z87438 Personal history of other diseases of male genital organs: Secondary | ICD-10-CM | POA: Diagnosis not present

## 2015-11-29 DIAGNOSIS — E7800 Pure hypercholesterolemia, unspecified: Secondary | ICD-10-CM | POA: Diagnosis not present

## 2015-11-29 LAB — BASIC METABOLIC PANEL
Anion gap: 7 (ref 5–15)
BUN: 14 mg/dL (ref 6–20)
CO2: 26 mmol/L (ref 22–32)
Calcium: 9.3 mg/dL (ref 8.9–10.3)
Chloride: 106 mmol/L (ref 101–111)
Creatinine, Ser: 0.94 mg/dL (ref 0.61–1.24)
GFR calc Af Amer: >60 mL/min (ref >60)
GFR calc non Af Amer: >60 mL/min (ref >60)
Glucose, Bld: 99 mg/dL (ref 65–99)
Potassium: 3.8 mmol/L (ref 3.5–5.1)
Sodium: 139 mmol/L (ref 135–145)

## 2015-11-29 LAB — CBC
HCT: 51 % (ref 40.0–52.0)
Hemoglobin: 17.8 g/dL (ref 13.0–18.0)
MCH: 30.4 pg (ref 26.0–34.0)
MCHC: 35 g/dL (ref 32.0–36.0)
MCV: 86.9 fL (ref 80.0–100.0)
Platelets: 210 10*3/uL (ref 150–440)
RBC: 5.86 MIL/uL (ref 4.40–5.90)
RDW: 14 % (ref 11.5–14.5)
WBC: 8 10*3/uL (ref 3.8–10.6)

## 2015-11-29 LAB — TROPONIN I
Troponin I: <0.03 ng/mL (ref <0.03)
Troponin I: <0.03 ng/mL (ref <0.03)
Troponin I: <0.03 ng/mL (ref <0.03)

## 2015-11-29 LAB — TSH: TSH: 0.585 u[IU]/mL (ref 0.350–4.500)

## 2015-11-29 LAB — FIBRIN DERIVATIVES D-DIMER (ARMC ONLY): Fibrin derivatives D-dimer (ARMC): 169 (ref 0–499)

## 2015-11-29 MED ORDER — TRIAMCINOLONE ACETONIDE 55 MCG/ACT NA AERO: 2.0000 | INHALATION_SPRAY | Freq: Every day | NASAL | Status: DC

## 2015-11-29 MED ORDER — DILTIAZEM HCL 100 MG IV SOLR
5.0000 mg/h | Freq: Once | INTRAVENOUS | Status: AC
  Administered 2015-11-29: 5 mg/h via INTRAVENOUS
  Filled 2015-11-29: qty 100

## 2015-11-29 MED ORDER — ACETAMINOPHEN 325 MG PO TABS
650.0000 mg | ORAL_TABLET | Freq: Four times a day (QID) | ORAL | Status: DC | PRN
  Administered 2015-11-29: 650 mg via ORAL
  Filled 2015-11-29: qty 2

## 2015-11-29 MED ORDER — ENOXAPARIN SODIUM 100 MG/ML ~~LOC~~ SOLN
90.0000 mg | Freq: Two times a day (BID) | SUBCUTANEOUS | Status: DC
  Administered 2015-11-29 – 2015-11-30 (×3): 90 mg via SUBCUTANEOUS
  Filled 2015-11-29 (×3): qty 1

## 2015-11-29 MED ORDER — OXYCODONE HCL 5 MG PO TABS: 5.0000 mg | ORAL_TABLET | ORAL | Status: DC | PRN

## 2015-11-29 MED ORDER — FLUTICASONE PROPIONATE 50 MCG/ACT NA SUSP
2.0000 | Freq: Every day | NASAL | Status: DC
  Filled 2015-11-29: qty 16

## 2015-11-29 MED ORDER — DILTIAZEM HCL 100 MG IV SOLR
5.0000 mg/h | INTRAVENOUS | Status: DC
  Administered 2015-11-29: 10 mg/h via INTRAVENOUS
  Administered 2015-11-29: 12.5 mg/h via INTRAVENOUS
  Administered 2015-11-29: 10 mg/h via INTRAVENOUS
  Administered 2015-11-29: 5 mg/h via INTRAVENOUS
  Administered 2015-11-29: 12.5 mg/h via INTRAVENOUS
  Administered 2015-11-29: 7.5 mg/h via INTRAVENOUS
  Administered 2015-11-30: 5 mg/h via INTRAVENOUS
  Administered 2015-11-30 (×2): 7.5 mg/h via INTRAVENOUS
  Administered 2015-11-30: 5 mg/h via INTRAVENOUS
  Administered 2015-11-30: 10 mg/h via INTRAVENOUS
  Administered 2015-11-30: 7.5 mg/h via INTRAVENOUS
  Administered 2015-11-30: 12.5 mg/h via INTRAVENOUS
  Administered 2015-11-30: 10 mg/h via INTRAVENOUS
  Filled 2015-11-29 (×2): qty 100

## 2015-11-29 MED ORDER — ALPRAZOLAM 0.5 MG PO TABS: 0.5000 mg | ORAL_TABLET | Freq: Every evening | ORAL | Status: DC | PRN

## 2015-11-29 MED ORDER — DILTIAZEM HCL 25 MG/5ML IV SOLN
10.0000 mg | Freq: Once | INTRAVENOUS | Status: AC
  Administered 2015-11-29: 10 mg via INTRAVENOUS
  Filled 2015-11-29: qty 5

## 2015-11-29 MED ORDER — ONDANSETRON HCL 4 MG PO TABS
4.0000 mg | ORAL_TABLET | Freq: Four times a day (QID) | ORAL | Status: DC | PRN
Start: 2015-11-29 — End: 2015-12-01

## 2015-11-29 MED ORDER — CYCLOBENZAPRINE HCL 10 MG PO TABS: 10.0000 mg | ORAL_TABLET | Freq: Three times a day (TID) | ORAL | Status: DC | PRN

## 2015-11-29 MED ORDER — TADALAFIL 10 MG PO TABS: 10.0000 mg | ORAL_TABLET | Freq: Every day | ORAL | Status: DC

## 2015-11-29 MED ORDER — ESCITALOPRAM OXALATE 10 MG PO TABS
20.0000 mg | ORAL_TABLET | Freq: Every day | ORAL | Status: DC
  Administered 2015-11-29 – 2015-11-30 (×2): 20 mg via ORAL
  Filled 2015-11-29 (×3): qty 2

## 2015-11-29 MED ORDER — FINASTERIDE 5 MG PO TABS
5.0000 mg | ORAL_TABLET | Freq: Every day | ORAL | Status: DC
  Administered 2015-11-29 – 2015-12-01 (×3): 5 mg via ORAL
  Filled 2015-11-29 (×3): qty 1

## 2015-11-29 MED ORDER — ATORVASTATIN CALCIUM 10 MG PO TABS
10.0000 mg | ORAL_TABLET | ORAL | Status: DC
  Administered 2015-11-30 – 2015-12-01 (×2): 10 mg via ORAL
  Filled 2015-11-29 (×2): qty 1

## 2015-11-29 MED ORDER — SODIUM CHLORIDE 0.9% FLUSH
3.0000 mL | Freq: Two times a day (BID) | INTRAVENOUS | Status: DC
  Administered 2015-11-29 – 2015-12-01 (×5): 3 mL via INTRAVENOUS

## 2015-11-29 MED ORDER — PANTOPRAZOLE SODIUM 40 MG PO TBEC
40.0000 mg | DELAYED_RELEASE_TABLET | Freq: Every day | ORAL | Status: DC
  Administered 2015-11-30 – 2015-12-01 (×2): 40 mg via ORAL
  Filled 2015-11-29 (×2): qty 1

## 2015-11-29 MED ORDER — ONDANSETRON HCL 4 MG/2ML IJ SOLN: 4.0000 mg | Freq: Four times a day (QID) | INTRAMUSCULAR | Status: DC | PRN

## 2015-11-29 MED ORDER — ACETAMINOPHEN 650 MG RE SUPP: 650.0000 mg | Freq: Four times a day (QID) | RECTAL | Status: DC | PRN

## 2015-11-29 NOTE — ED Triage Notes (Signed)
First Nurse: Patient ambulatory to triage. Patient speaking in full sentences. C/O of cough/shob

## 2015-11-29 NOTE — Progress Notes (Signed)
Heart rate dropping in 50's decreased Cardizem drip by 2.5 to 12.9ml/hr, verified by Baldo Ash, [place]. Will continue to assess.

## 2015-11-29 NOTE — ED Notes (Signed)
MD Lord at bedside. 

## 2015-11-29 NOTE — ED Provider Notes (Signed)
[facility] Emergency Department Provider Note ____________________________________________   I have reviewed the triage vital signs and the triage nursing note.  HISTORY  Chief Complaint Cough and Shortness of Breath   Historian Patient  HPI [name]. is a 60 y.o. male presenting today due to shortness of breath with dry hacking cough for about 2 weeks, doesn't seem to be improving, he is wondering if he has has pneumonia. Yesterday when he walked his dog he felt short of breath just walking the dog which is pretty unusual for him. Today he was brushing his dog on the floor, no exertional activity and also felt short of breath. No fever. No history of wheezing or bronchospasm. He is a nonsmoker. No leg swelling, significant travel history or other risk factors for PE. No pleuritic chest pain. Denies palpitations. States that in the last few months he had a nuclear stress test as well as an echo which he states were ultimately deemed to be reassuring. He also had brain imaging due to some episodes of dizziness, and states that those were fine. During this last 2 weeks he has not had any dizziness episodes. No weakness or numbness.  He does report nasal sinus congestion and nasal drainage.    Past Medical History:  Diagnosis Date  . Benign prostatic hyperplasia with urinary obstruction 08/23/2012  . Benign prostatic hypertrophy with lower urinary tract symptoms (LUTS)   . BPH (benign prostatic hyperplasia) 11/19/2013  . 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 Bulging disc    C2/3, C3/4, C6/7  . Cervical disc herniation    C4/5 and C5/6  . Colon polyp 04/18/2011  . Colon polyps   . Constipation 08/31/2012  . Diverticulosis   . 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 GERD (gastroesophageal reflux disease)   . H/O diverticulitis of colon   . Hemorrhoid   . Hiatal hernia   . Hyperlipidemia   . Infection of the  upper respiratory tract 12/29/2011  . Infection of the upper respiratory tract 12/29/2011  . Levoscoliosis   . Routine general medical examination at a health care facility 08/31/2012  . Scoliosis   . Spinal stenosis in cervical region    cord abutment C4/5  . Testicular pain, left   . Testicular pain, left     Patient Active Problem List   Diagnosis Date Noted  . Eye pain 09/04/2015  . Adjustment disorder with mixed anxiety and depressed mood 07/23/2015  . Episodic lightheadedness 07/23/2015  . Nocturia 03/31/2015  . OAB (overactive bladder) 03/31/2015  . Eye lesion 04/24/2014  . Plantar fasciitis of left foot 12/26/2013  . Generalized anxiety disorder 12/26/2013  . Belching 11/19/2013  . BPH (benign prostatic hyperplasia) 11/19/2013  . Hyperlipidemia 11/19/2013  . Spinal stenosis in cervical region 03/08/2013  . Lipoma of back 03/08/2013  . Routine general medical examination at a health care facility 08/31/2012  . Constipation 08/31/2012  . Benign prostatic hyperplasia with urinary obstruction 08/23/2012  . 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 Colon polyp 04/18/2011  . Pure hypercholesterolemia 04/18/2011    Past Surgical History:  Procedure Laterality Date  . GANGLION CYST EXCISION Right 1994   wrist & back  . HERNIA REPAIR Left 1994   abdominal repair with mesh  . NASAL SEPTUM SURGERY  2011   Dr. [name]     Prior to Admission medications   Medication Sig Start Date End  Date Taking? Authorizing Provider  ALPRAZolam Duanne Moron) 0.5 MG tablet Take 1 tablet (0.5 mg total) by mouth at bedtime as needed for anxiety. 10/27/15  Yes [name], MD  atorvastatin (LIPITOR) 10 MG tablet Take 10 mg by mouth every morning.   Yes Historical Provider, MD  cyclobenzaprine (FLEXERIL) 10 MG tablet TAKE 1 TABLET(10 MG) BY MOUTH THREE TIMES DAILY AS NEEDED FOR MUSCLE SPASMS Patient taking differently: Take 10 mg by mouth 3 (three) times daily as needed for  muscle spasms.  09/21/15  Yes [name], MD  escitalopram (LEXAPRO) 20 MG tablet Take 1 tablet (20 mg total) by mouth daily. 10/27/15  Yes [name], MD  finasteride (PROSCAR) 5 MG tablet Take 1 tablet (5 mg total) by mouth daily. 11/25/15  Yes Shannon A McGowan, [name]  meloxicam (MOBIC) 15 MG tablet Take 15 mg by mouth daily as needed for pain.   Yes Historical Provider, MD  mirabegron ER (MYRBETRIQ) 50 MG TB24 tablet Take 50 mg by mouth daily.   Yes Historical Provider, MD  pantoprazole (PROTONIX) 40 MG tablet Take 1 tablet (40 mg total) by mouth daily. 10/01/15  Yes [name], MD  Probiotic Product (ALIGN) 4 MG CAPS Take 1 capsule by mouth daily Patient taking differently: Take 1 capsule by mouth daily as needed. For bowel regularity 06/12/15  Yes [name], MD  tadalafil (CIALIS) 10 MG tablet Take 1 tablet (10 mg total) by mouth as needed. Patient taking differently: Take 10 mg by mouth daily.  09/28/15  Yes [name], MD  triamcinolone (NASACORT) 55 MCG/ACT AERO nasal inhaler Place 2 sprays into the nose daily as needed. For allergies   Yes Historical Provider, MD    Allergies  Allergen Reactions  . Other Other (See Comments)    Pollen:  Sinus infection, cough, fatigue    Family History  Problem Relation Age of Onset  . Hyperlipidemia Mother   . Cancer Mother     skin cancer?  . Heart disease Mother 54    MI - died in her sleep  . Hyperlipidemia Father   . Heart disease Father   . Kidney disease Neg Hx   . Prostate cancer Neg Hx     Social History Social History  Substance Use Topics  . Smoking status: Former Smoker    Years: 20.00    Types: Cigarettes    Quit date: 02/08/1991  . Smokeless tobacco: Never Used  . Alcohol use No    Review of Systems  Constitutional: Negative for fever. Eyes: Negative for visual changes. ENT: Negative for sore throat. Cardiovascular: Some mild chest pressure without chest pain. Respiratory: Positive  for shortness of breath. Gastrointestinal: Negative for abdominal pain, vomiting and diarrhea. Genitourinary: Negative for dysuria. Musculoskeletal: Negative for back pain. Skin: Negative for rash. Neurological: Negative for headache. 10 point Review of Systems otherwise negative ____________________________________________   PHYSICAL EXAM:  VITAL SIGNS: ED Triage Vitals  Enc Vitals Group     BP 11/29/15 0818 126/89     Pulse Rate 11/29/15 0818 (!) 133     Resp 11/29/15 0818 18     Temp 11/29/15 0818 97.4 F (36.3 C)     Temp Source 11/29/15 0818 Oral     SpO2 11/29/15 0818 99 %     Weight 11/29/15 0819 195 lb (88.5 kg)     Height 11/29/15 0819 5\' 10"  (1.778 m)     Head Circumference --  Peak Flow --      Pain Score 11/29/15 0819 4     Pain Loc --      Pain Edu? --      Excl. in [place]? --      Constitutional: Alert and oriented. Well appearing and in no distress. HEENT   Head: Normocephalic and atraumatic.      Eyes: Conjunctivae are normal. PERRL. Normal extraocular movements.      Ears:         Nose: No congestion/rhinnorhea.   Mouth/Throat: Mucous membranes are moist.   Neck: No stridor. Cardiovascular/Chest: Regular and tachycardic.  No murmurs, rubs, or gallops. Respiratory: Normal respiratory effort without tachypnea nor retractions. Breath sounds are clear and equal bilaterally. No wheezes/rales/rhonchi.  Occasional dry cough, but able to take a deep breath without coughing. Gastrointestinal: Soft. No distention, no guarding, no rebound. Nontender.    Genitourinary/rectal:Deferred Musculoskeletal: Nontender with normal range of motion in all extremities. No joint effusions.  No lower extremity tenderness.  No edema. Neurologic:  Normal speech and language. No gross or focal neurologic deficits are appreciated. Skin:  Skin is warm, dry and intact. No rash noted. Psychiatric: Mood and affect are normal. Speech and behavior are normal. Patient exhibits  appropriate insight and judgment.   ____________________________________________  LABS (pertinent positives/negatives)  Labs Reviewed  BASIC METABOLIC PANEL  CBC  TROPONIN I  FIBRIN DERIVATIVES D-DIMER ([facility] ONLY)    ____________________________________________    EKG I, Lisa Roca, MD, the attending physician have personally viewed and interpreted all ECGs.  131 bpm. Narrow complex tachycardia consistent with atrial flutter with 2:1 conduction. Undetermined axis. Nonspecific ST and T-wave ____________________________________________  RADIOLOGY All Xrays were viewed by me. Imaging interpreted by Radiologist.  Chest x-ray 1 view:  IMPRESSION: No active disease. Mild hyperinflation. Mild basilar reticular atelectasis or scarring. __________________________________________  PROCEDURES  Procedure(s) performed: None  Critical Care performed: None  ____________________________________________   ED COURSE / ASSESSMENT AND PLAN  Pertinent labs & imaging results that were available during my care of the patient were reviewed by me and considered in my medical decision making (see chart for details).    Mr. Yebra presented for dyspnea and fatigue over 2 weeks, was found to have rapid A. fib flutter, this is a new diagnosis.  Chest x-ray is clear. He is not having any symptoms of sepsis although it does sound like he's had a cough recently. He has no wheezing or hypoxia here.  I did give him diltiazem 10 mg IV bolus, with no significant improvement and then followed with a second 10 mg diltiazem bolus. Again no real change in her tachycardia to the 130s.  I ordered for patient to start a diltiazem drip. I spoke with the patient and his wife about hospital admission for workup and treatment of new onset atrial flutter with rapid ventricular response.    CONSULTATIONS:  Hospitalist for admission.   Patient / Family / Caregiver informed of clinical course, medical  decision-making process, and agree with plan.   ___________________________________________   FINAL CLINICAL IMPRESSION(S) / ED DIAGNOSES   Final diagnoses:  New onset atrial flutter (HCC)  Atrial flutter with rapid ventricular response ([place])  Dyspnea, unspecified type              Note: This dictation was prepared with Dragon dictation. Any transcriptional errors that result from this process are unintentional    Lisa Roca, MD 11/29/15 1239

## 2015-11-29 NOTE — Progress Notes (Signed)
Heart rate dropping in 50's decreased Cardizem drip by 2.5 to 10.59ml/hr, verified by Baldo Ash, [place]. Will continue to assess.

## 2015-11-29 NOTE — H&P (Signed)
[place] at [place] NAME: [name]    MR#:  [id number]  DATE OF BIRTH:  1955-09-15   DATE OF ADMISSION:  11/29/2015  PRIMARY CARE PHYSICIAN: [name], MD   REQUESTING/REFERRING PHYSICIAN: Reita Cliche  CHIEF COMPLAINT:   Chief Complaint  Patient presents with  . Cough  . Shortness of Breath    HISTORY OF PRESENT ILLNESS:  [name]  is a 60 y.o. male with a known history of BPH who is presenting with shortness of breath. He describes 2-3 day duration of shortness of breath mainly dyspnea on exertion but has progressed to shortness of breath at rest. He had associated palpitations this came to the Hospital further workup and evaluation Found to be in atrial flutter heart rate 150 received Cardizem 2 in emergency department without any improvement  PAST MEDICAL HISTORY:   Past Medical History:  Diagnosis Date  . Benign prostatic hyperplasia with urinary obstruction 08/23/2012  . Benign prostatic hypertrophy with lower urinary tract symptoms (LUTS)   . BPH (benign prostatic hyperplasia) 11/19/2013  . 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 Bulging disc    C2/3, C3/4, C6/7  . Cervical disc herniation    C4/5 and C5/6  . Colon polyp 04/18/2011  . Colon polyps   . Constipation 08/31/2012  . Diverticulosis   . 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 GERD (gastroesophageal reflux disease)   . H/O diverticulitis of colon   . Hemorrhoid   . Hiatal hernia   . Hyperlipidemia   . Infection of the upper respiratory tract 12/29/2011  . Infection of the upper respiratory tract 12/29/2011  . Levoscoliosis   . Routine general medical examination at a health care facility 08/31/2012  . Scoliosis   . Spinal stenosis in cervical region    cord abutment C4/5  . Testicular pain, left   . Testicular pain, left     PAST SURGICAL HISTORY:   Past Surgical History:  Procedure Laterality Date  .  GANGLION CYST EXCISION Right 1994   wrist & back  . HERNIA REPAIR Left 1994   abdominal repair with mesh  . NASAL SEPTUM SURGERY  2011   Dr. [name]     SOCIAL HISTORY:   Social History  Substance Use Topics  . Smoking status: Former Smoker    Years: 20.00    Types: Cigarettes    Quit date: 02/08/1991  . Smokeless tobacco: Never Used  . Alcohol use No    FAMILY HISTORY:   Family History  Problem Relation Age of Onset  . Hyperlipidemia Mother   . Cancer Mother     skin cancer?  . Heart disease Mother 52    MI - died in her sleep  . Hyperlipidemia Father   . Heart disease Father   . Kidney disease Neg Hx   . Prostate cancer Neg Hx     DRUG ALLERGIES:   Allergies  Allergen Reactions  . Other Other (See Comments)    Pollen:  Sinus infection, cough, fatigue    REVIEW OF SYSTEMS:  REVIEW OF SYSTEMS:  CONSTITUTIONAL: Denies fevers, chills, fatigue, weakness.  EYES: Denies blurred vision, double vision, or eye pain.  EARS, NOSE, THROAT: Denies tinnitus, ear pain, hearing loss.  RESPIRATORY: Positive cough, shortness of breath, denies wheezing  CARDIOVASCULAR: Denies chest pain, positive palpitations, denies edema.  GASTROINTESTINAL: Denies nausea, vomiting, diarrhea, abdominal pain.  GENITOURINARY: Denies dysuria,  hematuria.  ENDOCRINE: Denies nocturia or thyroid problems. HEMATOLOGIC AND LYMPHATIC: Denies easy bruising or bleeding.  SKIN: Denies rash or lesions.  MUSCULOSKELETAL: Denies pain in neck, back, shoulder, knees, hips, or further arthritic symptoms.  NEUROLOGIC: Denies paralysis, paresthesias.  PSYCHIATRIC: Denies anxiety or depressive symptoms. Otherwise full review of systems performed by me is negative.   MEDICATIONS AT HOME:   Prior to Admission medications   Medication Sig Start Date End Date Taking? Authorizing Provider  ALPRAZolam Duanne Moron) 0.5 MG tablet Take 1 tablet (0.5 mg total) by mouth at bedtime as needed for anxiety. 10/27/15  Yes [name], MD  atorvastatin (LIPITOR) 10 MG tablet Take 10 mg by mouth every morning.   Yes Historical Provider, MD  cyclobenzaprine (FLEXERIL) 10 MG tablet TAKE 1 TABLET(10 MG) BY MOUTH THREE TIMES DAILY AS NEEDED FOR MUSCLE SPASMS Patient taking differently: Take 10 mg by mouth 3 (three) times daily as needed for muscle spasms.  09/21/15  Yes [name], MD  escitalopram (LEXAPRO) 20 MG tablet Take 1 tablet (20 mg total) by mouth daily. 10/27/15  Yes [name], MD  finasteride (PROSCAR) 5 MG tablet Take 1 tablet (5 mg total) by mouth daily. 11/25/15  Yes Shannon A McGowan, [name]  meloxicam (MOBIC) 15 MG tablet Take 15 mg by mouth daily as needed for pain.   Yes Historical Provider, MD  mirabegron ER (MYRBETRIQ) 50 MG TB24 tablet Take 50 mg by mouth daily.   Yes Historical Provider, MD  pantoprazole (PROTONIX) 40 MG tablet Take 1 tablet (40 mg total) by mouth daily. 10/01/15  Yes [name], MD  Probiotic Product (ALIGN) 4 MG CAPS Take 1 capsule by mouth daily Patient taking differently: Take 1 capsule by mouth daily as needed. For bowel regularity 06/12/15  Yes [name], MD  tadalafil (CIALIS) 10 MG tablet Take 1 tablet (10 mg total) by mouth as needed. Patient taking differently: Take 10 mg by mouth daily.  09/28/15  Yes [name], MD  triamcinolone (NASACORT) 55 MCG/ACT AERO nasal inhaler Place 2 sprays into the nose daily as needed. For allergies   Yes Historical Provider, MD      VITAL SIGNS:  Blood pressure 124/83, pulse (!) 135, temperature 97.4 F (36.3 C), temperature source Oral, resp. rate 16, height 5\' 10"  (1.778 m), weight 88.5 kg (195 lb), SpO2 97 %.  PHYSICAL EXAMINATION:  VITAL SIGNS: Vitals:   11/29/15 1100 11/29/15 1148  BP: 131/87 124/83  Pulse: (!) 136 (!) 135  Resp: 12 16  Temp:     GENERAL:60 y.o.male currently in no acute distress.  HEAD: Normocephalic, atraumatic.  EYES: Pupils equal, round, reactive to light. Extraocular  muscles intact. No scleral icterus.  MOUTH: Moist mucosal membrane. Dentition intact. No abscess noted.  EAR, NOSE, THROAT: Clear without exudates. No external lesions.  NECK: Supple. No thyromegaly. No nodules. No JVD.  PULMONARY: Clear to ascultation, without wheeze rails or rhonci. No use of accessory muscles, Good respiratory effort. good air entry bilaterally CHEST: Nontender to palpation.  CARDIOVASCULAR: S1 and S2. Irregular rate and rhythm. No murmurs, rubs, or gallops. No edema. Pedal pulses 2+ bilaterally.  GASTROINTESTINAL: Soft, nontender, nondistended. No masses. Positive bowel sounds. No hepatosplenomegaly.  MUSCULOSKELETAL: No swelling, clubbing, or edema. Range of motion full in all extremities.  NEUROLOGIC: Cranial nerves II through XII are intact. No gross focal neurological deficits. Sensation intact. Reflexes intact.  SKIN: No ulceration, lesions, rashes, or cyanosis. Skin warm and dry.  Turgor intact.  PSYCHIATRIC: Mood, affect within normal limits. The patient is awake, alert and oriented x 3. Insight, judgment intact.    LABORATORY PANEL:   CBC  Recent Labs Lab 11/29/15 0850  WBC 8.0  HGB 17.8  HCT 51.0  PLT 210   ------------------------------------------------------------------------------------------------------------------  Chemistries   Recent Labs Lab 11/29/15 0850  NA 139  K 3.8  CL 106  CO2 26  GLUCOSE 99  BUN 14  CREATININE 0.94  CALCIUM 9.3   ------------------------------------------------------------------------------------------------------------------  Cardiac Enzymes  Recent Labs Lab 11/29/15 0850  TROPONINI <0.03   ------------------------------------------------------------------------------------------------------------------  RADIOLOGY:  Dg Chest Port 1 View  Result Date: 11/29/2015 CLINICAL DATA:  Cough, shortness of Breath EXAM: PORTABLE CHEST 1 VIEW COMPARISON:  11/20/2013 FINDINGS: Cardiomediastinal silhouette is  stable. Mild hyperinflation. No infiltrate or pulmonary edema. Mild basilar reticular atelectasis or scarring. IMPRESSION: No active disease. Mild hyperinflation. Mild basilar reticular atelectasis or scarring. Electronically Signed   By: Lahoma Crocker M.D.   On: 11/29/2015 09:16    EKG:   Orders placed or performed during the hospital encounter of 11/29/15  . EKG 12-Lead  . EKG 12-Lead  . EKG 12-Lead  . EKG 12-Lead    IMPRESSION AND PLAN:   60 year old Caucasian gentleman history of BPH who is presenting with shortness of breath  1. Atrial flutter with 2:1 conduction: Started on Cardizem will give dose of Lovenox for anticoagulation patient follows with [facility] for cardiology-consult, check TSH, check echocardiogram 2. BPH continue Proscar will hold myrbetriq as it can cause tachycardia 3. GERD without esophagitis PPI therapy and 4. Depression Lexapro 5. Venous thrombi embolism prophylactic: Therapeutic Lovenox    All the records are reviewed and case discussed with ED provider. Management plans discussed with the patient, family and they are in agreement.  CODE STATUS: Full  TOTAL TIME TAKING CARE OF THIS PATIENT: 33 minutes.    Hower,  Karenann Cai.D on 11/29/2015 at 1:09 PM  Between 7am to 6pm - Pager - [phone]  After 6pm: House Pager: - [facility]  Office  [phone]  CC: Primary care physician; [name], MD

## 2015-11-29 NOTE — Progress Notes (Signed)
Heart rate dropped to sustaining in 50's Cardizem drip decreased by 2.5 to 75ml/hr, verified by Baldo Ash, Therapist, sports. Paged prime awaiting call back. Will continue to assess

## 2015-11-29 NOTE — Progress Notes (Signed)
Pts HR is sustaining in the 130s, cardizem gtt will be increased by 2.5 and infusing at 12.5. SBP .> 110. I will continue to assess.

## 2015-11-29 NOTE — Progress Notes (Signed)
Dr. Jannifer Franklin returned call, new order to hold Cardizem order not discontinued incase HR increases then restart drip. Shae at [place] notified drip stopped. Will continue to monitor.

## 2015-11-29 NOTE — Progress Notes (Signed)
Pts HR is substained in 130s. SBP is >110, I increased cardizem gtt by 2.38ml/hr. Now infusing at 65ml/hr. I will continue to assess.

## 2015-11-29 NOTE — Progress Notes (Signed)
ANTICOAGULATION CONSULT NOTE - Initial Consult  Pharmacy Consult for enoxaparin Indication: atrial fibrillation  Allergies  Allergen Reactions  . Other Other (See Comments)    Pollen:  Sinus infection, cough, fatigue    Patient Measurements: Height: 5\' 10"  (177.8 cm) Weight: 195 lb (88.5 kg) IBW/kg (Calculated) : 73 Heparin Dosing Weight: 88.5kg  Vital Signs: Temp: 97.4 F (36.3 C) (10/22 0818) Temp Source: Oral (10/22 0818) BP: 124/83 (10/22 1148) Pulse Rate: 135 (10/22 1148)  Labs:  Recent Labs  11/29/15 0850  HGB 17.8  HCT 51.0  PLT 210  CREATININE 0.94  TROPONINI <0.03    Estimated Creatinine Clearance: 94.8 mL/min (by C-G formula based on SCr of 0.94 mg/dL).   Medical History: Past Medical History:  Diagnosis Date  . Benign prostatic hyperplasia with urinary obstruction 08/23/2012  . Benign prostatic hypertrophy with lower urinary tract symptoms (LUTS)   . BPH (benign prostatic hyperplasia) 11/19/2013  . 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 Bulging disc    C2/3, C3/4, C6/7  . Cervical disc herniation    C4/5 and C5/6  . Colon polyp 04/18/2011  . Colon polyps   . Constipation 08/31/2012  . Diverticulosis   . 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 GERD (gastroesophageal reflux disease)   . H/O diverticulitis of colon   . Hemorrhoid   . Hiatal hernia   . Hyperlipidemia   . Infection of the upper respiratory tract 12/29/2011  . Infection of the upper respiratory tract 12/29/2011  . Levoscoliosis   . Routine general medical examination at a health care facility 08/31/2012  . Scoliosis   . Spinal stenosis in cervical region    cord abutment C4/5  . Testicular pain, left   . Testicular pain, left     Assessment: 60yo M who presents with SOB and palpitations. Pt found to be in atrial fibrillation on ECG.   Goal of Therapy:  Monitor platelets by anticoagulation protocol: Yes   Plan:  Lovenox 90 mg  (1mg /kg) IV Q12h will be initiated. Baseline platelets and SCr checked. Continue to monitor SCr and CBC, in addition to signs or symptoms of bleeding.    Loree Fee, PharmD 11/29/2015,1:32 PM

## 2015-11-29 NOTE — Progress Notes (Signed)
Heart rate dropped to 35 returned to 50's Cardizem drip decreased by 2.5 to 7.52ml/hr, verified by Baldo Ash, [place]. Will continue to assess.

## 2015-11-29 NOTE — Progress Notes (Signed)
Pts HR is between 88-120, Pt SBP remains >100, cardizem gtt remains at 7.5. I will continue to assess.

## 2015-11-29 NOTE — Progress Notes (Signed)
Patient's cardizem gtt is at 7.5mg /hr, HR is in the 90-100s, SBP is <120, and pt has no complaints of pain. I will continue to monitor.

## 2015-11-29 NOTE — ED Triage Notes (Addendum)
C/O dry cough x 2 weeks.  Yesterday noticed feeling sob and some chest discomfort.   Pt AAOx3.  Skin warm and dry.  DOE noted.  States recently had stress test, which was "fine".

## 2015-11-29 NOTE — Progress Notes (Signed)
Pt arrives from ER cardizem gtt infusing at 65ml/hr. I will continue to assess and monitor patient and titrate gtt per policy.

## 2015-11-29 NOTE — Progress Notes (Signed)
Heart rate sustaining in 120's-130's increased Cardizem drip by 2.72ml to 20ml/hr. Verified by Thomas Hoff, RN. Will continue to monitor.

## 2015-11-29 NOTE — Progress Notes (Signed)
Pt remains stable on cardizem gtt. Pt is a-fib in 90-100s on telemetry. Pts SBP remains above 100. I will continue to assess.

## 2015-11-29 NOTE — Care Management Important Message (Signed)
Important Message  Patient Details  Name: [name]. MRN: [id number] Date of Birth: April 09, 1955   Medicare Important Message Given:  Yes    Daianna Vasques A, RN 11/29/2015, 3:09 PM

## 2015-11-30 ENCOUNTER — Inpatient Hospital Stay (HOSPITAL_COMMUNITY)
Admit: 2015-11-30 | Discharge: 2015-11-30 | Disposition: A | Discharge status: Home / Self Care | Payer: Medicare Other | Attending: Internal Medicine | Admitting: Internal Medicine

## 2015-11-30 DIAGNOSIS — M542 Cervicalgia: Secondary | ICD-10-CM

## 2015-11-30 DIAGNOSIS — R0600 Dyspnea, unspecified: Secondary | ICD-10-CM

## 2015-11-30 DIAGNOSIS — G8929 Other chronic pain: Secondary | ICD-10-CM

## 2015-11-30 DIAGNOSIS — E7800 Pure hypercholesterolemia, unspecified: Secondary | ICD-10-CM

## 2015-11-30 DIAGNOSIS — I4892 Unspecified atrial flutter: Secondary | ICD-10-CM

## 2015-11-30 LAB — BASIC METABOLIC PANEL
Anion gap: 4 — ABNORMAL LOW (ref 5–15)
BUN: 18 mg/dL (ref 6–20)
CO2: 27 mmol/L (ref 22–32)
Calcium: 9 mg/dL (ref 8.9–10.3)
Chloride: 106 mmol/L (ref 101–111)
Creatinine, Ser: 0.97 mg/dL (ref 0.61–1.24)
GFR calc Af Amer: >60 mL/min (ref >60)
GFR calc non Af Amer: >60 mL/min (ref >60)
Glucose, Bld: 113 mg/dL — ABNORMAL HIGH (ref 65–99)
Potassium: 3.9 mmol/L (ref 3.5–5.1)
Sodium: 137 mmol/L (ref 135–145)

## 2015-11-30 LAB — CBC
HCT: 49.6 % (ref 40.0–52.0)
Hemoglobin: 17 g/dL (ref 13.0–18.0)
MCH: 30 pg (ref 26.0–34.0)
MCHC: 34.2 g/dL (ref 32.0–36.0)
MCV: 87.6 fL (ref 80.0–100.0)
Platelets: 186 10*3/uL (ref 150–440)
RBC: 5.67 MIL/uL (ref 4.40–5.90)
RDW: 13.6 % (ref 11.5–14.5)
WBC: 7.9 10*3/uL (ref 3.8–10.6)

## 2015-11-30 LAB — ECHOCARDIOGRAM COMPLETE
Height: 70 in
Weight: 3113.6 oz

## 2015-11-30 LAB — MAGNESIUM: Magnesium: 2 mg/dL (ref 1.7–2.4)

## 2015-11-30 LAB — TROPONIN I: Troponin I: <0.03 ng/mL (ref <0.03)

## 2015-11-30 MED ORDER — APIXABAN 5 MG PO TABS
5.0000 mg | ORAL_TABLET | Freq: Two times a day (BID) | ORAL | Status: DC
  Administered 2015-12-01: 5 mg via ORAL
  Filled 2015-11-30: qty 1

## 2015-11-30 MED ORDER — SENNA 8.6 MG PO TABS
1.0000 | ORAL_TABLET | Freq: Every day | ORAL | Status: DC
  Administered 2015-11-30 – 2015-12-01 (×2): 8.6 mg via ORAL
  Filled 2015-11-30 (×2): qty 1

## 2015-11-30 MED ORDER — DIGOXIN 250 MCG PO TABS
0.5000 mg | ORAL_TABLET | Freq: Once | ORAL | Status: AC
  Administered 2015-11-30: 0.5 mg via ORAL
  Filled 2015-11-30: qty 2

## 2015-11-30 MED ORDER — METOPROLOL TARTRATE 25 MG PO TABS
12.5000 mg | ORAL_TABLET | Freq: Three times a day (TID) | ORAL | Status: DC
  Administered 2015-11-30 – 2015-12-01 (×3): 12.5 mg via ORAL
  Filled 2015-11-30 (×3): qty 1

## 2015-11-30 MED ORDER — DILTIAZEM HCL 30 MG PO TABS
30.0000 mg | ORAL_TABLET | Freq: Three times a day (TID) | ORAL | Status: DC
  Administered 2015-11-30 – 2015-12-01 (×2): 30 mg via ORAL
  Filled 2015-11-30 (×2): qty 1

## 2015-11-30 MED ORDER — DIGOXIN 250 MCG PO TABS
0.2500 mg | ORAL_TABLET | Freq: Every day | ORAL | Status: DC
  Administered 2015-12-01: 0.25 mg via ORAL
  Filled 2015-11-30: qty 1

## 2015-11-30 NOTE — Progress Notes (Signed)
Cardizem gtt decreased to 7.5mg /hr, will continue to monitor.

## 2015-11-30 NOTE — Progress Notes (Signed)
HR continues to sustain in 130's, Cardizem drip increased to 10.21ml/hr, verified with Franco Collet, RN. Will continue to monitor pt.

## 2015-11-30 NOTE — Plan of Care (Signed)
Problem: Tissue Perfusion: Goal: Risk factors for ineffective tissue perfusion will decrease SQ Lovenox

## 2015-11-30 NOTE — Progress Notes (Signed)
HR continues to sustain in 130's, Cardizem drip increased to 7.40ml/hr, verified with Franco Collet, RN. Will continue to monitor pt.

## 2015-11-30 NOTE — Progress Notes (Signed)
Cardizem decreased to 5mg /hr, will continue to monitor.

## 2015-11-30 NOTE — Progress Notes (Signed)
[place] at [place] NAME: [name]    MR#:  [id number]  DATE OF BIRTH:  1955/07/12  SUBJECTIVE:  CHIEF COMPLAINT:   Chief Complaint  Patient presents with  . Cough  . Shortness of Breath     Came with worsening SOB, and found having palpitation. Found to have A fib with RVR.   Started on cardizem drip- HR is under control, cardio started on oral meds to help rate control, pt feels fine.  REVIEW OF SYSTEMS:  CONSTITUTIONAL: No fever, fatigue or weakness.  EYES: No blurred or double vision.  EARS, NOSE, AND THROAT: No tinnitus or ear pain.  RESPIRATORY: No cough,positive for shortness of breath,no wheezing or hemoptysis.  CARDIOVASCULAR: No chest pain, orthopnea, edema.  GASTROINTESTINAL: No nausea, vomiting, diarrhea or abdominal pain.  GENITOURINARY: No dysuria, hematuria.  ENDOCRINE: No polyuria, nocturia,  HEMATOLOGY: No anemia, easy bruising or bleeding SKIN: No rash or lesion. MUSCULOSKELETAL: No joint pain or arthritis.   NEUROLOGIC: No tingling, numbness, weakness.  PSYCHIATRY: No anxiety or depression.   ROS  DRUG ALLERGIES:   Allergies  Allergen Reactions  . Other Other (See Comments)    Pollen:  Sinus infection, cough, fatigue    VITALS:  Blood pressure 115/79, pulse (!) 59, temperature 97.5 F (36.4 C), temperature source Oral, resp. rate 18, height 5\' 10"  (1.778 m), weight 88.3 kg (194 lb 9.6 oz), SpO2 98 %.  PHYSICAL EXAMINATION:  GENERAL:  60 y.o.-year-old patient lying in the bed with no acute distress.  EYES: Pupils equal, round, reactive to light and accommodation. No scleral icterus. Extraocular muscles intact.  HEENT: Head atraumatic, normocephalic. Oropharynx and nasopharynx clear.  NECK:  Supple, no jugular venous distention. No thyroid enlargement, no tenderness.  LUNGS: Normal breath sounds bilaterally, no wheezing, rales,rhonchi or crepitation. No use of accessory muscles of respiration.   CARDIOVASCULAR: S1, S2 normal. No murmurs, rubs, or gallops.  ABDOMEN: Soft, nontender, nondistended. Bowel sounds present. No organomegaly or mass.  EXTREMITIES: No pedal edema, cyanosis, or clubbing.  NEUROLOGIC: Cranial nerves II through XII are intact. Muscle strength 5/5 in all extremities. Sensation intact. Gait not checked.  PSYCHIATRIC: The patient is alert and oriented x 3.  SKIN: No obvious rash, lesion, or ulcer.   Physical Exam LABORATORY PANEL:   CBC  Recent Labs Lab 11/30/15 0233  WBC 7.9  HGB 17.0  HCT 49.6  PLT 186   ------------------------------------------------------------------------------------------------------------------  Chemistries   Recent Labs Lab 11/30/15 0233  NA 137  K 3.9  CL 106  CO2 27  GLUCOSE 113*  BUN 18  CREATININE 0.97  CALCIUM 9.0  MG 2.0   ------------------------------------------------------------------------------------------------------------------  Cardiac Enzymes  Recent Labs Lab 11/29/15 1942 11/30/15 0233  TROPONINI <0.03 <0.03   ------------------------------------------------------------------------------------------------------------------  RADIOLOGY:  Dg Chest Port 1 View  Result Date: 11/29/2015 CLINICAL DATA:  Cough, shortness of Breath EXAM: PORTABLE CHEST 1 VIEW COMPARISON:  11/20/2013 FINDINGS: Cardiomediastinal silhouette is stable. Mild hyperinflation. No infiltrate or pulmonary edema. Mild basilar reticular atelectasis or scarring. IMPRESSION: No active disease. Mild hyperinflation. Mild basilar reticular atelectasis or scarring. Electronically Signed   By: Lahoma Crocker M.D.   On: 11/29/2015 09:16    ASSESSMENT AND PLAN:   Active Problems:   Atrial flutter with rapid ventricular response ([employer])  1. Atrial flutter with rapid ventricular response:    Started on Cardizem , Lovenox for anticoagulation     TSH normal , check echocardiogram- awaited result.   Appreciated  cardio consult- Given one  dose oral digoxin and metoprolol. 2. BPH continue Proscar will hold myrbetriq as it can cause tachycardia 3. GERD without esophagitis PPI therapy and 4. Depression Lexapro 5. Venous thrombi embolism prophylactic: Therapeutic Lovenox  All the records are reviewed and case discussed with Care Management/Social Workerr. Management plans discussed with the patient, family and they are in agreement.  CODE STATUS: full.  TOTAL TIME TAKING CARE OF THIS PATIENT: 35 critical care minutes.  Pt is still on IV rate controlling meds, monitor on tele.   POSSIBLE D/C IN 1-2 DAYS, DEPENDING ON CLINICAL CONDITION.   Vaughan Basta M.D on 11/30/2015   Between 7am to 6pm - Pager - [phone]  After 6pm go to [email or web address] - password [email or web address] [facility]  Office  [phone]  CC: Primary care physician; [name], MD  Note: This dictation was prepared with Dragon dictation along with smaller phrase technology. Any transcriptional errors that result from this process are unintentional.

## 2015-11-30 NOTE — Progress Notes (Signed)
Cardizem gtt decreased to 10mg /hr, HR running 70-80's, will monitor

## 2015-11-30 NOTE — Consult Note (Signed)
Cardiology Consultation Note  Patient ID: Tyler Wichers., MRN: [id number], DOB/AGE: Dec 03, 1955 60 y.o. Admit date: 11/29/2015   Date of Consult: 11/30/2015 Primary Physician: [name], MD Primary Cardiologist: Dr. Fletcher Anon, MD Requesting Physician: Dr. Lavetta Nielsen, MD  Chief Complaint: Palpitations/SOB Reason for Consult: New onset atrial flutter with RVR  HPI: 60 y.o. male with h/o atypical chest pain that improved with PPI, HLD, prior tobacco abuse quitting in 1993, BPH on Proscar and Myrbetriq, and chronic low back pain who presented to [facility] on 10/22 with 2 day history of palpitations and SOB.  He was last seen by Dr. Fletcher Anon on 10/22/15 for follow up of his atypical chest pain. At taht time he had noted intermittent dizziness, SOB, and palpitations. He underwent a Lexiscan Myoview on 09/09/2015 that showed no evidence of ischemia with an inaccurate EF. Because of the inaccurate EF he underwent echo on 09/30/15 that showed an EF of 50-55% with no significant valvular abnormalities. At his follow up abive he noted headache a poor sleep that improved with Xanax. MRI brain had previously been unremarkable. There was concern for sleep apena and he was advised to discuss this with his PCP.   He presented to [facility] on 10/22 with 2 day history of sudden onset of palpitations, SOB, and fatigue while getting his dog ready to go to the groomer. He reports becoming so fatigued he could not even move. Symptoms felt similar to what he was experiencing over the spring and summer, though worse this time. No chest pain, presyncope, nausea, vomiting, or syncope. No recent illnesses or trauma. He does snore while sleeping.   Upon his arrival he was found to have a K+ of 3.8-->3.9, SCr 0.94, unremarkable CBC x 2, normal TSH, troponin negative x 4, negative d-dimer, CXR without active disease. EKG showed typical atrial flutter with 2:1 AV conduction, 131 bpm, left axis deviation, non specific st/t changes. He received IV  Cardizem x 2 in the ED without much improvement in heart rate. He was started on a Cardizem gtt with improvement in his heart rate to the 50's bpm overnight leading to the discontinuation of Cardizem gtt without PO medication. Unfortunatelym he redeveloped tachycardia and required restarting of the infusion overnight for rate control. His gtt is now being weaned back down as he recently had a heart rate in the 80's bpm, though upon weaning again he redeveloped tachycardia with a heart rate in the 130s bpm in atrial flutter. He has also been started on full-dose Lovenox by IM (CHASDS2VASc of 0). Echo is pending.   Past Medical History:  Diagnosis Date  . Benign prostatic hyperplasia with urinary obstruction 08/23/2012  . Benign prostatic hypertrophy with lower urinary tract symptoms (LUTS)   . BPH (benign prostatic hyperplasia) 11/19/2013  . 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 Bulging disc    C2/3, C3/4, C6/7  . Cervical disc herniation    C4/5 and C5/6  . Colon polyp 04/18/2011  . Colon polyps   . Constipation 08/31/2012  . Diverticulosis   . 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 GERD (gastroesophageal reflux disease)   . H/O diverticulitis of colon   . Hemorrhoid   . Hiatal hernia   . Hyperlipidemia   . Infection of the upper respiratory tract 12/29/2011  . Infection of the upper respiratory tract 12/29/2011  . Levoscoliosis   . Routine general medical examination at a health care facility 08/31/2012  .  Scoliosis   . Spinal stenosis in cervical region    cord abutment C4/5  . Testicular pain, left   . Testicular pain, left       Most Recent Cardiac Studies: As above   Surgical History:  Past Surgical History:  Procedure Laterality Date  . GANGLION CYST EXCISION Right 1994   wrist & back  . HERNIA REPAIR Left 1994   abdominal repair with mesh  . NASAL SEPTUM SURGERY  2011   Dr. [name]      Home Meds: Prior to Admission  medications   Medication Sig Start Date End Date Taking? Authorizing Provider  ALPRAZolam Duanne Moron) 0.5 MG tablet Take 1 tablet (0.5 mg total) by mouth at bedtime as needed for anxiety. 10/27/15  Yes [name], MD  atorvastatin (LIPITOR) 10 MG tablet Take 10 mg by mouth every morning.   Yes Historical Provider, MD  cyclobenzaprine (FLEXERIL) 10 MG tablet TAKE 1 TABLET(10 MG) BY MOUTH THREE TIMES DAILY AS NEEDED FOR MUSCLE SPASMS Patient taking differently: Take 10 mg by mouth 3 (three) times daily as needed for muscle spasms.  09/21/15  Yes [name], MD  escitalopram (LEXAPRO) 20 MG tablet Take 1 tablet (20 mg total) by mouth daily. 10/27/15  Yes [name], MD  finasteride (PROSCAR) 5 MG tablet Take 1 tablet (5 mg total) by mouth daily. 11/25/15  Yes Shannon A McGowan, [name]  meloxicam (MOBIC) 15 MG tablet Take 15 mg by mouth daily as needed for pain.   Yes Historical Provider, MD  mirabegron ER (MYRBETRIQ) 50 MG TB24 tablet Take 50 mg by mouth daily.   Yes Historical Provider, MD  pantoprazole (PROTONIX) 40 MG tablet Take 1 tablet (40 mg total) by mouth daily. 10/01/15  Yes [name], MD  Probiotic Product (ALIGN) 4 MG CAPS Take 1 capsule by mouth daily Patient taking differently: Take 1 capsule by mouth daily as needed. For bowel regularity 06/12/15  Yes [name], MD  tadalafil (CIALIS) 10 MG tablet Take 1 tablet (10 mg total) by mouth as needed. Patient taking differently: Take 10 mg by mouth daily.  09/28/15  Yes [name], MD  triamcinolone (NASACORT) 55 MCG/ACT AERO nasal inhaler Place 2 sprays into the nose daily as needed. For allergies   Yes Historical Provider, MD    Inpatient Medications:  . atorvastatin  10 mg Oral [id number]  . enoxaparin (LOVENOX) injection  90 mg Subcutaneous Q12H  . escitalopram  20 mg Oral Daily  . finasteride  5 mg Oral Daily  . fluticasone  2 spray Each Nare Daily  . pantoprazole  40 mg Oral Daily  . sodium chloride  flush  3 mL Intravenous Q12H   . diltiazem (CARDIZEM) infusion 7.5 mg/hr (11/30/15 1156)    Allergies:  Allergies  Allergen Reactions  . Other Other (See Comments)    Pollen:  Sinus infection, cough, fatigue    Social History   Social History  . Marital status: Married    Spouse name: [name]  . Number of children: 1  . Years of education: GED   Occupational History  . Disability     Herniated disk, spinal stenosis   Social History Main Topics  . Smoking status: Former Smoker    Years: 20.00    Types: Cigarettes    Quit date: 02/08/1991  . Smokeless tobacco: Never Used  . Alcohol use No  . Drug use: No  . Sexual activity: Yes    Partners:  Female   Other Topics Concern  . Not on file   Social History Narrative   Lavone was born and raised in [place], [place]. He quit school after the 8th grade because of financial hardship. He was working since age 83. He went back and got his GED. He moved to [place] in 2006. He lives at home with his wife of 60 years and their 36 year old daughter. Previously, Lannie was married to his first wife for 20 years and they divorced. They had no children.  Jaqwon has been disabled 2/2 back problems.       Family History  Problem Relation Age of Onset  . Hyperlipidemia Mother   . Cancer Mother     skin cancer?  . Heart disease Mother 49    MI - died in her sleep  . Hyperlipidemia Father   . Heart disease Father   . Kidney disease Neg Hx   . Prostate cancer Neg Hx      Review of Systems: Review of Systems  Constitutional: Positive for malaise/fatigue. Negative for chills, diaphoresis, fever and weight loss.  HENT: Negative for congestion.   Eyes: Negative for discharge and redness.  Respiratory: Positive for shortness of breath. Negative for cough, hemoptysis, sputum production and wheezing.   Cardiovascular: Positive for palpitations. Negative for chest pain, orthopnea, claudication, leg swelling and PND.  Gastrointestinal: Negative  for abdominal pain, blood in stool, heartburn, melena, nausea and vomiting.  Genitourinary: Negative for hematuria.  Musculoskeletal: Negative for falls and myalgias.  Skin: Negative for rash.  Neurological: Positive for dizziness and weakness. Negative for tingling, tremors, sensory change, speech change, focal weakness and loss of consciousness.  Endo/Heme/Allergies: Does not bruise/bleed easily.  Psychiatric/Behavioral: Negative for substance abuse. The patient is nervous/anxious.   All other systems reviewed and are negative.   Labs:  Recent Labs  11/29/15 0850 11/29/15 1424 11/29/15 1942 11/30/15 0233  TROPONINI <0.03 <0.03 <0.03 <0.03   Lab Results  Component Value Date   WBC 7.9 11/30/2015   HGB 17.0 11/30/2015   HCT 49.6 11/30/2015   MCV 87.6 11/30/2015   PLT 186 11/30/2015    Recent Labs Lab 11/30/15 0233  NA 137  K 3.9  CL 106  CO2 27  BUN 18  CREATININE 0.97  CALCIUM 9.0  GLUCOSE 113*   Lab Results  Component Value Date   CHOL 166 06/12/2015   HDL 44.60 06/12/2015   LDLCALC 106 (H) 06/12/2015   TRIG 78.0 06/12/2015   No results found for: DDIMER  Radiology/Studies:  Dg Chest Port 1 View  Result Date: 11/29/2015 CLINICAL DATA:  Cough, shortness of Breath EXAM: PORTABLE CHEST 1 VIEW COMPARISON:  11/20/2013 FINDINGS: Cardiomediastinal silhouette is stable. Mild hyperinflation. No infiltrate or pulmonary edema. Mild basilar reticular atelectasis or scarring. IMPRESSION: No active disease. Mild hyperinflation. Mild basilar reticular atelectasis or scarring. Electronically Signed   By: Lahoma Crocker M.D.   On: 11/29/2015 09:16    EKG: Interpreted by me showed: Typical atrial flutter, 131 bpm, left axis deviation, nonspecific st/t changes  Telemetry: Interpreted by me showed: currently in atrial flutter with heart rate in the 130 bpm. Has had episodes of rate controlled atrial flutter  Weights: Filed Weights   11/29/15 0819 11/29/15 1409  Weight: 195 lb  (88.5 kg) 194 lb 9.6 oz (88.3 kg)     Physical Exam: Blood pressure 115/79, pulse (!) 59, temperature 97.5 F (36.4 C), temperature source Oral, resp. rate 18, height 5\' 10"  (1.778  m), weight 194 lb 9.6 oz (88.3 kg), SpO2 98 %. Body mass index is 27.92 kg/m. General: Well developed, well nourished, in no acute distress. Head: Normocephalic, atraumatic, sclera non-icteric, no xanthomas, nares are without discharge.  Neck: Negative for carotid bruits. JVD not elevated. Lungs: Clear bilaterally to auscultation without wheezes, rales, or rhonchi. Breathing is unlabored. Heart: Tachycardic with S1 S2. No murmurs, rubs, or gallops appreciated. Abdomen: Soft, non-tender, non-distended with normoactive bowel sounds. No hepatomegaly. No rebound/guarding. No obvious abdominal masses. Msk:  Strength and tone appear normal for age. Extremities: No clubbing or cyanosis. No edema. Distal pedal pulses are 2+ and equal bilaterally. Neuro: Alert and oriented X 3. No facial asymmetry. No focal deficit. Moves all extremities spontaneously. Psych:  Responds to questions appropriately with a normal affect.    Assessment and Plan:  Active Problems:   Atrial flutter with rapid ventricular response (HCC)    1. New onset atrial flutter with RVR: -Difficult to control rate upon weaning Cardizem gtt x 2 now -Will likely need to bridge with PO rate controlling medication such as metoprolol while attempting to wean IV Cardizem as he will not be able to wean IV medication without also being on PO medication -Start Lopressor 12.5 mg q 8 hours (given BP) and attempt to wean Cardizem slowly -Give a one-time dose of digoxin 0.5 mg. Check digoxin level in the morning -Slowly wean Cardizem as above -Echo pending (this was done while tachycardic) -Add magnesium level -TSH and potassium ok -Possible undiagnosed sleep apnea, will need outpatient sleep study -Continue Lovenox for now in case he will require DCCV if he  proves his rate will be difficult to control -May need short-term anticoagulation until he undergoes evaluation for atrial flutter ablation as an outpatient  -Could use Eliquis or Xarelto -Check A1C and lipid panel for further risk stratification    Signed, Christell Faith, [name] [facility] Pager: [phone] 11/30/2015, 12:54 PM

## 2015-11-30 NOTE — Progress Notes (Signed)
Cardizem increased to 12.5mg /hr, HR 130's, verified with Cristopher Peru RN, will monitor.

## 2015-11-30 NOTE — Progress Notes (Signed)
Cardizem gtt stopped per md orders, will continue to monitor

## 2015-11-30 NOTE — Progress Notes (Signed)
Pt. HR began to sustain in 130's, paged Dr. Marcille Blanco, new order to restart the drip. Cardizem drip restarted at 26ml/hr. Franco Collet, RN verified. Pt. Alert and oriented with no c/o pain, SOB or acute distress noted. Pt. Able to carry on complete converations with distress. Will continue to monitor pt.

## 2015-12-01 DIAGNOSIS — Z7189 Other specified counseling: Secondary | ICD-10-CM

## 2015-12-01 LAB — CBC
HCT: 48.7 % (ref 40.0–52.0)
Hemoglobin: 16.6 g/dL (ref 13.0–18.0)
MCH: 30.2 pg (ref 26.0–34.0)
MCHC: 34.1 g/dL (ref 32.0–36.0)
MCV: 88.6 fL (ref 80.0–100.0)
Platelets: 187 10*3/uL (ref 150–440)
RBC: 5.49 MIL/uL (ref 4.40–5.90)
RDW: 14 % (ref 11.5–14.5)
WBC: 7.5 10*3/uL (ref 3.8–10.6)

## 2015-12-01 LAB — HEMOGLOBIN A1C
Hgb A1c MFr Bld: 5.4 % (ref 4.8–5.6)
Mean Plasma Glucose: 108 mg/dL

## 2015-12-01 LAB — LIPID PANEL
Cholesterol: 159 mg/dL (ref 0–200)
HDL: 43 mg/dL (ref >40)
LDL Cholesterol: 98 mg/dL (ref 0–99)
Total CHOL/HDL Ratio: 3.7 RATIO
Triglycerides: 91 mg/dL (ref <150)
VLDL: 18 mg/dL (ref 0–40)

## 2015-12-01 LAB — BASIC METABOLIC PANEL
Anion gap: 6 (ref 5–15)
BUN: 16 mg/dL (ref 6–20)
CO2: 26 mmol/L (ref 22–32)
Calcium: 9 mg/dL (ref 8.9–10.3)
Chloride: 107 mmol/L (ref 101–111)
Creatinine, Ser: 0.9 mg/dL (ref 0.61–1.24)
GFR calc Af Amer: >60 mL/min (ref >60)
GFR calc non Af Amer: >60 mL/min (ref >60)
Glucose, Bld: 117 mg/dL — ABNORMAL HIGH (ref 65–99)
Potassium: 4.1 mmol/L (ref 3.5–5.1)
Sodium: 139 mmol/L (ref 135–145)

## 2015-12-01 MED ORDER — APIXABAN 5 MG PO TABS: 5.0000 mg | ORAL_TABLET | Freq: Two times a day (BID) | ORAL | 0 refills | Status: DC

## 2015-12-01 MED ORDER — DIGOXIN 250 MCG PO TABS: 0.2500 mg | ORAL_TABLET | Freq: Every day | ORAL | 0 refills | Status: DC

## 2015-12-01 MED ORDER — DILTIAZEM HCL ER COATED BEADS 120 MG PO CP24: 120.0000 mg | ORAL_CAPSULE | Freq: Every day | ORAL | 0 refills | Status: DC

## 2015-12-01 MED ORDER — DILTIAZEM HCL 30 MG PO TABS: 30.0000 mg | ORAL_TABLET | Freq: Three times a day (TID) | ORAL | 0 refills | Status: DC | PRN

## 2015-12-01 MED ORDER — DILTIAZEM HCL 30 MG PO TABS: 30.0000 mg | ORAL_TABLET | Freq: Three times a day (TID) | ORAL | Status: DC | PRN

## 2015-12-01 MED ORDER — DILTIAZEM HCL ER COATED BEADS 120 MG PO CP24
120.0000 mg | ORAL_CAPSULE | Freq: Every day | ORAL | Status: DC
  Administered 2015-12-01: 120 mg via ORAL
  Filled 2015-12-01: qty 1

## 2015-12-01 MED ORDER — DILTIAZEM HCL ER COATED BEADS 120 MG PO CP24
120.0000 mg | ORAL_CAPSULE | Freq: Every day | ORAL | 0 refills | Status: DC
Start: 2015-12-02 — End: 2015-12-09

## 2015-12-01 NOTE — Discharge Summary (Signed)
[facility] [place] NAME: [name]    MR#:  [id number]  DATE OF BIRTH:  1956/01/21  DATE OF ADMISSION:  11/29/2015 ADMITTING PHYSICIAN: Lytle Butte, MD  DATE OF DISCHARGE: 12/01/2015  PRIMARY CARE PHYSICIAN: [name], MD    ADMISSION DIAGNOSIS:  Atrial flutter with rapid ventricular response ([place]) [I48.92] New onset atrial flutter (HCC) [I48.92] Dyspnea, unspecified type [R06.00]  DISCHARGE DIAGNOSIS:  Active Problems:   Atrial flutter with rapid ventricular response (HCC)   Dyspnea   Chronic neck pain   Encounter for anticoagulation discussion and counseling   SECONDARY DIAGNOSIS:   Past Medical History:  Diagnosis Date  . Benign prostatic hyperplasia with urinary obstruction 08/23/2012  . Benign prostatic hypertrophy with lower urinary tract symptoms (LUTS)   . BPH (benign prostatic hyperplasia) 11/19/2013  . 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 Bulging disc    C2/3, C3/4, C6/7  . Cervical disc herniation    C4/5 and C5/6  . Colon polyp 04/18/2011  . Colon polyps   . Constipation 08/31/2012  . Diverticulosis   . 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 GERD (gastroesophageal reflux disease)   . H/O diverticulitis of colon   . Hemorrhoid   . Hiatal hernia   . Hyperlipidemia   . Infection of the upper respiratory tract 12/29/2011  . Infection of the upper respiratory tract 12/29/2011  . Levoscoliosis   . Routine general medical examination at a health care facility 08/31/2012  . Scoliosis   . Spinal stenosis in cervical region    cord abutment C4/5  . Testicular pain, left   . Testicular pain, left     HOSPITAL COURSE:   1. Atrial flutter with rapid ventricular response:    Started on Cardizem , Lovenox for anticoagulation     TSH normal , checked echocardiogram- EF 55%   Appreciated cardio consult- Given one dose oral digoxin and metoprolol.   HR  reasonably stable on cardizem CD 120 mg daily and 30 mg short acting PRN.   Also started on Oral eliquis and plan is to set up for ablation procedure as out pt. 2. BPH continue Proscar will hold myrbetriq as it can cause tachycardia 3. GERD without esophagitis PPI therapy and 4. Depression Lexapro 5. Venous thrombi embolism prophylactic: Therapeutic Lovenox  DISCHARGE CONDITIONS:   Stable.  CONSULTS OBTAINED:  Treatment Team:  Lytle Butte, MD Minna Merritts, MD  DRUG ALLERGIES:   Allergies  Allergen Reactions  . Other Other (See Comments)    Pollen:  Sinus infection, cough, fatigue    DISCHARGE MEDICATIONS:   Current Discharge Medication List    START taking these medications   Details  apixaban (ELIQUIS) 5 MG TABS tablet Take 1 tablet (5 mg total) by mouth 2 (two) times daily. Qty: 60 tablet, Refills: 0    digoxin (LANOXIN) 0.25 MG tablet Take 1 tablet (0.25 mg total) by mouth daily. Qty: 30 tablet, Refills: 0    diltiazem (CARDIZEM CD) 120 MG 24 hr capsule Take 1 capsule (120 mg total) by mouth daily. Qty: 30 capsule, Refills: 0    diltiazem (CARDIZEM) 30 MG tablet Take 1 tablet (30 mg total) by mouth every 8 (eight) hours as needed (for tachycardia). Qty: 30 tablet, Refills: 0      CONTINUE these medications which have NOT CHANGED   Details  ALPRAZolam (XANAX) 0.5 MG tablet Take 1  tablet (0.5 mg total) by mouth at bedtime as needed for anxiety. Qty: 20 tablet, Refills: 0    atorvastatin (LIPITOR) 10 MG tablet Take 10 mg by mouth every morning.    cyclobenzaprine (FLEXERIL) 10 MG tablet TAKE 1 TABLET(10 MG) BY MOUTH THREE TIMES DAILY AS NEEDED FOR MUSCLE SPASMS Qty: 30 tablet, Refills: 0    escitalopram (LEXAPRO) 20 MG tablet Take 1 tablet (20 mg total) by mouth daily. Qty: 90 tablet, Refills: 3    finasteride (PROSCAR) 5 MG tablet Take 1 tablet (5 mg total) by mouth daily. Qty: 30 tablet, Refills: 3   Associated Diagnoses: Elevated PSA    meloxicam  (MOBIC) 15 MG tablet Take 15 mg by mouth daily as needed for pain.    mirabegron ER (MYRBETRIQ) 50 MG TB24 tablet Take 50 mg by mouth daily.    pantoprazole (PROTONIX) 40 MG tablet Take 1 tablet (40 mg total) by mouth daily. Qty: 90 tablet, Refills: 1    Probiotic Product (ALIGN) 4 MG CAPS Take 1 capsule by mouth daily Qty: 30 capsule, Refills: 3   Associated Diagnoses: Belching    tadalafil (CIALIS) 10 MG tablet Take 1 tablet (10 mg total) by mouth as needed. Qty: 30 tablet, Refills: 3    triamcinolone (NASACORT) 55 MCG/ACT AERO nasal inhaler Place 2 sprays into the nose daily as needed. For allergies         DISCHARGE INSTRUCTIONS:    Follow with Cardiology clinic in 1-2 weeks.  If you experience worsening of your admission symptoms, develop shortness of breath, life threatening emergency, suicidal or homicidal thoughts you must seek medical attention immediately by calling 911 or calling your MD immediately  if symptoms less severe.  You Must read complete instructions/literature along with all the possible adverse reactions/side effects for all the Medicines you take and that have been prescribed to you. Take any new Medicines after you have completely understood and accept all the possible adverse reactions/side effects.   Please note  You were cared for by a hospitalist during your hospital stay. If you have any questions about your discharge medications or the care you received while you were in the hospital after you are discharged, you can call the unit and asked to speak with the hospitalist on call if the hospitalist that took care of you is not available. Once you are discharged, your primary care physician will handle any further medical issues. Please note that NO REFILLS for any discharge medications will be authorized once you are discharged, as it is imperative that you return to your primary care physician (or establish a relationship with a primary care physician if  you do not have one) for your aftercare needs so that they can reassess your need for medications and monitor your lab values.    Today   CHIEF COMPLAINT:   Chief Complaint  Patient presents with  . Cough  . Shortness of Breath    HISTORY OF PRESENT ILLNESS:  [name]  is a 60 y.o. male with a known history of BPH who is presenting with shortness of breath. He describes 2-3 day duration of shortness of breath mainly dyspnea on exertion but has progressed to shortness of breath at rest. He had associated palpitations this came to the Hospital further workup and evaluation Found to be in atrial flutter heart rate 150 received Cardizem 2 in emergency department without any improvement  VITAL SIGNS:  Blood pressure (!) 117/93, pulse 87, temperature 97.5 F (36.4 C),  temperature source Oral, resp. rate 20, height 5\' 10"  (1.778 m), weight 88.3 kg (194 lb 9.6 oz), SpO2 96 %.  I/O:   Intake/Output Summary (Last 24 hours) at 12/01/15 1505 Last data filed at 12/01/15 0949  Gross per 24 hour  Intake            492.5 ml  Output              225 ml  Net            267.5 ml    PHYSICAL EXAMINATION:  GENERAL:  60 y.o.-year-old patient lying in the bed with no acute distress.  EYES: Pupils equal, round, reactive to light and accommodation. No scleral icterus. Extraocular muscles intact.  HEENT: Head atraumatic, normocephalic. Oropharynx and nasopharynx clear.  NECK:  Supple, no jugular venous distention. No thyroid enlargement, no tenderness.  LUNGS: Normal breath sounds bilaterally, no wheezing, rales,rhonchi or crepitation. No use of accessory muscles of respiration.  CARDIOVASCULAR: S1, S2 normal. No murmurs, rubs, or gallops.  ABDOMEN: Soft, non-tender, non-distended. Bowel sounds present. No organomegaly or mass.  EXTREMITIES: No pedal edema, cyanosis, or clubbing.  NEUROLOGIC: Cranial nerves II through XII are intact. Muscle strength 5/5 in all extremities. Sensation intact. Gait  not checked.  PSYCHIATRIC: The patient is alert and oriented x 3.  SKIN: No obvious rash, lesion, or ulcer.   DATA REVIEW:   CBC  Recent Labs Lab 12/01/15 0354  WBC 7.5  HGB 16.6  HCT 48.7  PLT 187    Chemistries   Recent Labs Lab 11/30/15 0233 12/01/15 0354  NA 137 139  K 3.9 4.1  CL 106 107  CO2 27 26  GLUCOSE 113* 117*  BUN 18 16  CREATININE 0.97 0.90  CALCIUM 9.0 9.0  MG 2.0  --     Cardiac Enzymes  Recent Labs Lab 11/30/15 0233  TROPONINI <0.03    Microbiology Results  Results for orders placed or performed in visit on 04/21/15  Microscopic Examination     Status: None   Collection Time: 04/21/15  9:15 AM  Result Value Ref Range Status   WBC, UA None seen 0 - 5 /hpf Final   RBC, UA None seen 0 - 2 /hpf Final   Epithelial Cells (non renal) None seen 0 - 10 /hpf Final   Bacteria, UA None seen None seen/Few Final    RADIOLOGY:  No results found.  EKG:   Orders placed or performed during the hospital encounter of 11/29/15  . EKG 12-Lead  . EKG 12-Lead  . EKG 12-Lead  . EKG 12-Lead      Management plans discussed with the patient, family and they are in agreement.  CODE STATUS:     Code Status Orders        Start     Ordered   11/29/15 1250  Full code  Continuous     11/29/15 1250    Code Status History    Date Active Date Inactive Code Status Order ID Comments User Context   This patient has a current code status but no historical code status.      TOTAL TIME TAKING CARE OF THIS PATIENT: 35 minutes.    Vaughan Basta M.D on 12/01/2015 at 3:05 PM  Between 7am to 6pm - Pager - [phone]  After 6pm go to [email or web address] - password [email or web address] [facility]  Office  [phone]  CC: Primary care physician; [name], MD   Note: This dictation  was prepared with Dragon dictation along with smaller phrase technology. Any transcriptional errors that result from this process are  unintentional.

## 2015-12-01 NOTE — Care Management Note (Signed)
Case Management Note  Patient Details  Name: [name]. MRN: [id number] Date of Birth: 07-Jul-1955  Subjective/Objective:      Faxed at [phone] coupon for Eliquis to Mr Kellogg, [facility] at Fax: [phone]. Spoke with pharmacist and alerted him that a faxed coupon for Eliquis is coming.               Action/Plan:   Expected Discharge Date:                  Expected Discharge Plan:     In-House Referral:     Discharge planning Services     Post Acute Care Choice:    Choice offered to:     DME Arranged:    DME Agency:     HH Arranged:    HH Agency:     Status of Service:     If discussed at [employer] of Stay Meetings, dates discussed:    Additional Comments:  [name] A, RN 12/01/2015, 3:58 PM

## 2015-12-01 NOTE — Progress Notes (Signed)
Patient discharged to home with family with proper discharge instructions, follow-up appointments and new prescriptions were sent electronically to local pharmacy. Patient was receptive towards discharge education and did not have any further questions.

## 2015-12-01 NOTE — Progress Notes (Signed)
Patient: [name]. / Admit Date: 11/29/2015 / Date of Encounter: 12/01/2015, 11:01 AM   Subjective: Heart rate better controlled this morning with rates in the 70's at rest and into the 130's with ambulation at times. Echo with normal EF. Lytes ok, LDL ok, A1C ok. Not interested to TEE/DCCV. Has not yet received AM meds at time of rounding.   Review of Systems: Review of Systems  Constitutional: Positive for malaise/fatigue. Negative for chills, diaphoresis, fever and weight loss.  HENT: Negative for congestion.   Eyes: Negative for discharge and redness.  Respiratory: Positive for cough and shortness of breath. Negative for hemoptysis, sputum production and wheezing.   Cardiovascular: Positive for palpitations. Negative for chest pain, orthopnea, claudication, leg swelling and PND.  Gastrointestinal: Negative for abdominal pain, blood in stool, heartburn, melena, nausea and vomiting.  Genitourinary: Negative for hematuria.  Musculoskeletal: Negative for falls and myalgias.  Skin: Negative for rash.  Neurological: Negative for dizziness, tingling, tremors, sensory change, speech change, focal weakness, loss of consciousness and weakness.  Endo/Heme/Allergies: Does not bruise/bleed easily.  Psychiatric/Behavioral: Negative for substance abuse. The patient is nervous/anxious.   All other systems reviewed and are negative.   Objective: Telemetry: Atrial flutter with rates ranging from 70's at rest to 130's with ambulation  Physical Exam: Blood pressure 113/66, pulse (!) 105, temperature 98 F (36.7 C), temperature source Oral, resp. rate 18, height 5\' 10"  (1.778 m), weight 194 lb 9.6 oz (88.3 kg), SpO2 99 %. Body mass index is 27.92 kg/m. General: Well developed, well nourished, in no acute distress. Head: Normocephalic, atraumatic, sclera non-icteric, no xanthomas, nares are without discharge. Neck: Negative for carotid bruits. JVP not elevated. Lungs: Clear bilaterally to  auscultation without wheezes, rales, or rhonchi. Breathing is unlabored. Heart: Irregular S1 S2 without murmurs, rubs, or gallops.  Abdomen: Soft, non-tender, non-distended with normoactive bowel sounds. No rebound/guarding. Extremities: No clubbing or cyanosis. No edema. Distal pedal pulses are 2+ and equal bilaterally. Neuro: Alert and oriented X 3. Moves all extremities spontaneously. Psych:  Responds to questions appropriately with a normal affect.   Intake/Output Summary (Last 24 hours) at 12/01/15 1101 Last data filed at 12/01/15 0949  Gross per 24 hour  Intake            752.5 ml  Output              450 ml  Net            302.5 ml    Inpatient Medications:  . apixaban  5 mg Oral BID  . atorvastatin  10 mg Oral [id number]  . digoxin  0.25 mg Oral Daily  . diltiazem  30 mg Oral Q8H  . escitalopram  20 mg Oral Daily  . finasteride  5 mg Oral Daily  . fluticasone  2 spray Each Nare Daily  . metoprolol tartrate  12.5 mg Oral Q8H  . pantoprazole  40 mg Oral Daily  . senna  1 tablet Oral Daily  . sodium chloride flush  3 mL Intravenous Q12H   Infusions:    Labs:  Recent Labs  11/30/15 0233 12/01/15 0354  NA 137 139  K 3.9 4.1  CL 106 107  CO2 27 26  GLUCOSE 113* 117*  BUN 18 16  CREATININE 0.97 0.90  CALCIUM 9.0 9.0  MG 2.0  --    No results for input(s): AST, ALT, ALKPHOS, BILITOT, PROT, ALBUMIN in the last 72 hours.  Recent Labs  11/30/15 0233 12/01/15 0354  WBC 7.9 7.5  HGB 17.0 16.6  HCT 49.6 48.7  MCV 87.6 88.6  PLT 186 187    Recent Labs  11/29/15 0850 11/29/15 1424 11/29/15 1942 11/30/15 0233  TROPONINI <0.03 <0.03 <0.03 <0.03   Invalid input(s): POCBNP  Recent Labs  11/30/15 0233  HGBA1C 5.4     Weights: Filed Weights   11/29/15 0819 11/29/15 1409  Weight: 195 lb (88.5 kg) 194 lb 9.6 oz (88.3 kg)     Radiology/Studies:  Dg Chest Port 1 View  Result Date: 11/29/2015 CLINICAL DATA:  Cough, shortness of Breath EXAM: PORTABLE  CHEST 1 VIEW COMPARISON:  11/20/2013 FINDINGS: Cardiomediastinal silhouette is stable. Mild hyperinflation. No infiltrate or pulmonary edema. Mild basilar reticular atelectasis or scarring. IMPRESSION: No active disease. Mild hyperinflation. Mild basilar reticular atelectasis or scarring. Electronically Signed   By: Lahoma Crocker M.D.   On: 11/29/2015 09:16     Assessment and Plan  Active Problems:   Atrial flutter with rapid ventricular response (HCC)   Dyspnea   Chronic neck pain    1. New onset atrial flutter with RVR: -Rate improving as above -Has not yet received AM medication, will reassess rate after her has gotten all AM medications -If remains well rate controlled with AM medications he may be able to be discharged later today on short-acting diltiazem as his BP may not support long acting given the need for Lopressor 25 mg bid as well. Would also send home with low-dose digoxin with a planned recheck digoxin level later this week in the office.  -Continue Eliquis 5 mg bid -CHADS2VASc 0 -He is interested in atrial flutter ablation. Plan to have patient see EP as an outpatient to set this up -He is disabled and spends most of his time resting at home, thus the above heart rates may be ok for him  Signed, Marcille Blanco [place] Pager: [phone] 12/01/2015, 11:01 AM

## 2015-12-07 ENCOUNTER — Telehealth: Payer: Self-pay | Admitting: *Deleted

## 2015-12-07 MED ORDER — ATORVASTATIN CALCIUM 10 MG PO TABS: 10.0000 mg | ORAL_TABLET | ORAL | 1 refills | Status: DC

## 2015-12-07 NOTE — Telephone Encounter (Signed)
Sent to pharmacy 

## 2015-12-07 NOTE — Telephone Encounter (Signed)
Patient has requested a medication refill for atorvastatin  Pharmacy [facility]

## 2015-12-07 NOTE — Telephone Encounter (Signed)
Pease advise on refill

## 2015-12-08 ENCOUNTER — Encounter: Payer: Self-pay | Admitting: Physician Assistant

## 2015-12-08 NOTE — Progress Notes (Signed)
Cardiology Office Note Date:  12/09/2015  Patient ID:  Tyler Kuzara., DOB 01-09-1956, MRN [id number] PCP:  [name], MD  Cardiologist:  Dr. Fletcher Anon, MD    Chief Complaint: Hospital follow up  History of Present Illness: Tyler Stavros. is a 60 y.o. male with history of recently diagnosed atrial flutter, atypical chest pain with improved with PPI, HLD, prior tobacco abuse quitting in 1993, BPH on Proscar and Myrbetriq, and chronic back pain who presents for hospital follow up of recent admission to [facility] from 10/22 to 10/24 for newly diagnosed atrial flutter.   He was last seen by Dr. Fletcher Anon on 10/22/15 for follow up of his atypical chest pain. At that time he had noted intermittent dizziness, SOB, and palpitations. He underwent a Lexiscan Myoview on 09/09/2015 that showed no evidence of ischemia with an inaccurate EF. Because of the inaccurate EF he underwent echo on 09/30/15 that showed an EF of 50-55% with no significant valvular abnormalities. At his follow up above he noted headache a poor sleep that improved with Xanax. MRI brain had previously been unremarkable. There was concern for sleep apena and he was advised to discuss this with his PCP. He was admitted on 10/22 with a 2-day history of sudden onset of palpitations, SOB, and fatigue while getting his dog ready to go to the groomer. He was found to be in new onset typical atrial flutter with RVR with heart rate in the 130-150 bpm range. He was rate controlled and started on full-dose anticoagulation with Eliquis. Echo showed EF 50-55%, RWMA could not be excluded, not technically sufficient to evaluate LV diastolic function, mild MR, left atrium mildly dilated, PASP normal, rhythm of atrial flutter. TSH normal, K+ 3.8 repleted to 4.1, Mg++ 2.0, negative D-dimer, troponin negative x 3, CBC unremarkable, LDL 98, A1C 5.4%. He did not want TEE/DCCV inpatient.   He has done well since his hospital discharge. Heart rate has averaged int he 80's  to 90's bpm. At times, if he gets very worked up or stressed he has noted a tachycardic heart rate into the 130's bpm for a few minutes then this improves back to his baseline. Tolerating all medications without issues. Has not missed any doses of Eliquis. Weight stable. No LE swelling. No orthopnea or early satiety. No chest pain. Occasionally feels palpitations when laying in bed at nighttime trying to fall asleep. He continues to not want a cardioversion and only wants an atrial flutter ablation.    Past Medical History:  Diagnosis Date  . BPH (benign prostatic hyperplasia) 11/19/2013  . 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 Bulging disc    C2/3, C3/4, C6/7  . Cervical disc herniation    C4/5 and C5/6  . Colon polyp 04/18/2011  . Constipation 08/31/2012  . Diverticulosis   . 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 GERD (gastroesophageal reflux disease)   . H/O diverticulitis of colon   . Hemorrhoid   . Hiatal hernia   . Hyperlipidemia   . Levoscoliosis   . Spinal stenosis in cervical region    cord abutment C4/5  . Testicular pain, left   . Typical atrial flutter ([place]) 11/2015   a. CHADS2VASc => 0; b. on Eliquis for pending ablation     Past Surgical History:  Procedure Laterality Date  . GANGLION CYST EXCISION Right 1994   wrist & back  . HERNIA REPAIR Left 1994  abdominal repair with mesh  . NASAL SEPTUM SURGERY  2011   Dr. [name]     Current Outpatient Prescriptions  Medication Sig Dispense Refill  . ALPRAZolam (XANAX) 0.5 MG tablet Take 1 tablet (0.5 mg total) by mouth at bedtime as needed for anxiety. 20 tablet 0  . apixaban (ELIQUIS) 5 MG TABS tablet Take 1 tablet (5 mg total) by mouth 2 (two) times daily. 60 tablet 0  . atorvastatin (LIPITOR) 10 MG tablet Take 1 tablet (10 mg total) by mouth every morning. 90 tablet 1  . cyclobenzaprine (FLEXERIL) 10 MG tablet TAKE 1 TABLET(10 MG) BY MOUTH THREE TIMES DAILY AS NEEDED  FOR MUSCLE SPASMS (Patient taking differently: Take 10 mg by mouth 3 (three) times daily as needed for muscle spasms. ) 30 tablet 0  . digoxin (LANOXIN) 0.25 MG tablet Take 1 tablet (0.25 mg total) by mouth daily. 30 tablet 0  . diltiazem (CARDIZEM CD) 120 MG 24 hr capsule Take 1 capsule (120 mg total) by mouth daily. 30 capsule 0  . diltiazem (CARDIZEM) 30 MG tablet Take 1 tablet (30 mg total) by mouth every 8 (eight) hours as needed (for tachycardia). 30 tablet 0  . escitalopram (LEXAPRO) 20 MG tablet Take 1 tablet (20 mg total) by mouth daily. 90 tablet 3  . finasteride (PROSCAR) 5 MG tablet Take 1 tablet (5 mg total) by mouth daily. 30 tablet 3  . meloxicam (MOBIC) 15 MG tablet Take 15 mg by mouth daily as needed for pain.    . mirabegron ER (MYRBETRIQ) 50 MG TB24 tablet Take 50 mg by mouth daily.    . pantoprazole (PROTONIX) 40 MG tablet Take 1 tablet (40 mg total) by mouth daily. 90 tablet 1  . Probiotic Product (ALIGN) 4 MG CAPS Take 1 capsule by mouth daily (Patient taking differently: Take 1 capsule by mouth daily as needed. For bowel regularity) 30 capsule 3  . tadalafil (CIALIS) 10 MG tablet Take 1 tablet (10 mg total) by mouth as needed. (Patient taking differently: Take 10 mg by mouth daily. ) 30 tablet 3  . triamcinolone (NASACORT) 55 MCG/ACT AERO nasal inhaler Place 2 sprays into the nose daily as needed. For allergies     No current facility-administered medications for this visit.     Allergies:   Other   Social History:  The patient  reports that he quit smoking about 24 years ago. His smoking use included Cigarettes. He quit after 20.00 years of use. He has never used smokeless tobacco. He reports that he does not drink alcohol or use drugs.   Family History:  The patient's family history includes Cancer in his mother; Heart disease in his father; Heart disease (age of onset: 28) in his mother; Hyperlipidemia in his father and mother.  ROS:   Review of Systems    Constitutional: Positive for malaise/fatigue. Negative for chills, diaphoresis, fever and weight loss.  HENT: Negative for congestion.   Eyes: Negative for discharge and redness.  Respiratory: Negative for cough, hemoptysis, sputum production, shortness of breath and wheezing.   Cardiovascular: Positive for palpitations. Negative for chest pain, orthopnea, claudication, leg swelling and PND.  Gastrointestinal: Negative for abdominal pain, blood in stool, heartburn, melena, nausea and vomiting.  Genitourinary: Negative for hematuria.  Musculoskeletal: Negative for falls and myalgias.  Skin: Negative for rash.  Neurological: Positive for weakness. Negative for dizziness, tingling, tremors, sensory change, speech change, focal weakness and loss of consciousness.  Endo/Heme/Allergies: Does not bruise/bleed easily.  Psychiatric/Behavioral:  Negative for substance abuse. The patient is nervous/anxious.   All other systems reviewed and are negative.    PHYSICAL EXAM:  VS:  BP 112/68 (BP Location: Left Arm, Patient Position: Sitting, Cuff Size: Normal)   Pulse 83   Ht 5\' 10"  (1.778 m)   Wt 199 lb 8 oz (90.5 kg)   BMI 28.63 kg/m  BMI: Body mass index is 28.63 kg/m.  Physical Exam  Constitutional: He is oriented to person, place, and time. He appears well-developed and well-nourished.  HENT:  Head: Normocephalic and atraumatic.  Eyes: Right eye exhibits no discharge. Left eye exhibits no discharge.  Neck: Normal range of motion. No JVD present.  Cardiovascular: Normal rate, S1 normal, S2 normal and normal heart sounds.  An irregular rhythm present. Exam reveals no distant heart sounds, no friction rub, no midsystolic click and no opening snap.   No murmur heard. Pulmonary/Chest: Effort normal and breath sounds normal. No respiratory distress. He has no decreased breath sounds. He has no wheezes. He has no rales. He exhibits no tenderness.  Abdominal: Soft. He exhibits no distension. There is  no tenderness.  Musculoskeletal: He exhibits no edema.  Neurological: He is alert and oriented to person, place, and time.  Skin: Skin is warm and dry. No cyanosis. Nails show no clubbing.  Psychiatric: He has a normal mood and affect. His speech is normal and behavior is normal. Judgment and thought content normal.     EKG:  Was ordered and interpreted by me today. Shows typical atrial flutter with variable AV block, 83 bpm, nonspecific st/t changes  Recent Labs: 06/12/2015: ALT 13 11/29/2015: TSH 0.585 11/30/2015: Magnesium 2.0 12/01/2015: BUN 16; Creatinine, Ser 0.90; Hemoglobin 16.6; Platelets 187; Potassium 4.1; Sodium 139  12/01/2015: Cholesterol 159; HDL 43; LDL Cholesterol 98; Total CHOL/HDL Ratio 3.7; Triglycerides 91; VLDL 18   Estimated Creatinine Clearance: 100 mL/min (by C-G formula based on SCr of 0.9 mg/dL).   Wt Readings from Last 3 Encounters:  12/09/15 199 lb 8 oz (90.5 kg)  11/29/15 194 lb 9.6 oz (88.3 kg)  11/23/15 196 lb 14.4 oz (89.3 kg)     Other studies reviewed: Additional studies/records reviewed today include: summarized above  ASSESSMENT AND PLAN:  1. Typical atrial flutter: Currently in rate-controlled atrial flutter with variable AV block and a heart rate in the 80's bpm. Continue full-dose anticoagulation with Eliquis 5 mg bid for now as he will be scheduled to see EP for evaluation of possible atrial flutter ablation. Discussed with him the option of DCCV, he declines. Only wants ablation. Continue Cardizem CD 120 gm daily for rate control. Hold digoxin in the morning of 11/2 until we get in touch with him regarding his digoxin level from his blood work today. Has not needed to take any prn short-acting diltiazem. Referral to pulmonary medicine for possible sleep apnea. Check digoxin level, bmet, and cbc. CHADS2VASc => 0.   2. HLD: LDL from 12/01/2015 of 98. LFT from 06/2015 ok. Continue Lipitor 10 mg daily.   Disposition: F/u with me in 2 months.    Current medicines are reviewed at length with the patient today.  The patient did not have any concerns regarding medicines.  Melvern Banker [name] 12/09/2015 2:19 PM     [address], [place] [address] [phone]

## 2015-12-09 ENCOUNTER — Ambulatory Visit (INDEPENDENT_AMBULATORY_CARE_PROVIDER_SITE_OTHER): Payer: Medicare Other | Admitting: Physician Assistant

## 2015-12-09 ENCOUNTER — Encounter: Payer: Self-pay | Admitting: Physician Assistant

## 2015-12-09 VITALS — BP 112/68 | HR 83 | Ht 70.0 in | Wt 199.5 lb

## 2015-12-09 DIAGNOSIS — I483 Typical atrial flutter: Secondary | ICD-10-CM | POA: Diagnosis not present

## 2015-12-09 DIAGNOSIS — E782 Mixed hyperlipidemia: Secondary | ICD-10-CM

## 2015-12-09 DIAGNOSIS — R0683 Snoring: Secondary | ICD-10-CM | POA: Diagnosis not present

## 2015-12-09 MED ORDER — APIXABAN 5 MG PO TABS: 5.0000 mg | ORAL_TABLET | Freq: Two times a day (BID) | ORAL | 3 refills | Status: DC

## 2015-12-09 MED ORDER — DILTIAZEM HCL 30 MG PO TABS: 30.0000 mg | ORAL_TABLET | Freq: Three times a day (TID) | ORAL | 3 refills | Status: DC | PRN

## 2015-12-09 MED ORDER — DILTIAZEM HCL ER COATED BEADS 120 MG PO CP24: 120.0000 mg | ORAL_CAPSULE | Freq: Every day | ORAL | 3 refills | Status: DC

## 2015-12-09 NOTE — Patient Instructions (Signed)
Medication Instructions:  Your physician recommends that you continue on your current medications as directed. Please refer to the Current Medication list given to you today.   Labwork: BMET, CBC, digoxin level  Testing/Procedures: none  Follow-Up: Your physician recommends that you schedule a follow-up appointment with Christell Faith, [name] or Dr. Rockey Situ after your ablation   Any Other Special Instructions Will Be Listed Below (If Applicable). Your physician recommends that you schedule an appointment with Dr. Caryl Comes, EP, for aflutter Your physician recommends that you schedule an appointment with pulmonary for a sleep study.       If you need a refill on your cardiac medications before your next appointment, please call your pharmacy.

## 2015-12-10 ENCOUNTER — Other Ambulatory Visit: Payer: Self-pay

## 2015-12-10 DIAGNOSIS — I4892 Unspecified atrial flutter: Secondary | ICD-10-CM

## 2015-12-10 LAB — BASIC METABOLIC PANEL
BUN/Creatinine Ratio: 14 (ref 9–20)
BUN: 15 mg/dL (ref 6–24)
CO2: 25 mmol/L (ref 18–29)
Calcium: 9 mg/dL (ref 8.7–10.2)
Chloride: 102 mmol/L (ref 96–106)
Creatinine, Ser: 1.07 mg/dL (ref 0.76–1.27)
GFR calc Af Amer: 87 mL/min/{1.73_m2} (ref >59)
GFR calc non Af Amer: 76 mL/min/{1.73_m2} (ref >59)
Glucose: 130 mg/dL — ABNORMAL HIGH (ref 65–99)
Potassium: 4.3 mmol/L (ref 3.5–5.2)
Sodium: 142 mmol/L (ref 134–144)

## 2015-12-10 LAB — CBC
Hematocrit: 43.2 % (ref 37.5–51.0)
Hemoglobin: 14.7 g/dL (ref 12.6–17.7)
MCH: 29.3 pg (ref 26.6–33.0)
MCHC: 34 g/dL (ref 31.5–35.7)
MCV: 86 fL (ref 79–97)
Platelets: 240 10*3/uL (ref 150–379)
RBC: 5.01 x10E6/uL (ref 4.14–5.80)
RDW: 13.9 % (ref 12.3–15.4)
WBC: 6.9 10*3/uL (ref 3.4–10.8)

## 2015-12-10 LAB — DIGOXIN LEVEL: Digoxin, Serum: 0.9 ng/mL (ref 0.5–0.9)

## 2015-12-10 MED ORDER — DIGOXIN 250 MCG PO TABS: 0.1250 mg | ORAL_TABLET | Freq: Every day | ORAL | 0 refills | Status: DC

## 2015-12-14 NOTE — Progress Notes (Signed)
[facility] Consultation      Assessment and Plan:  Excessive daytime sleepiness. --Symptoms and signs of obstructive sleep apnea.  --Will send for sleep study.   Atrial Fibrillation.  -Sleep apnea may be contributing to atrial fibrillation, the patient is scheduled for an ablation. -We will be important to treat sleep apnea present to try to prevent recurrence of cardiac arrhythmias.  Syncopal episodes.  -Secondary to above.  Depression/Anxiety.  -Patient has been on Lexapro which may be contributing to daytime sleepiness.  Claustrophobia -Patient has severe claustrophobia and is not interested in starting CPAP. -Therefore, we'll consider a dental device if the sleep study is positive.  Date: 12/14/2015  MRN# [id number] [name]. 05/28/1955  Referring Physician:   [name] is a 60 y.o. old male seen in consultation for chief complaint of:    Chief Complaint  Patient presents with  . Follow-up    Per Ryann Dunn. former MW pt last seen in 2015. pt c/o daytime sleepiness, restless sleep, loud snoring & occ gasp for breathe during sleep.     HPI:   The patient is a 60 year old male with the recently diagnosed persistent atrial flutter. He was seen by his cardiologist for this problem and was referred for possible obstructive sleep apnea. He goes to bed at 9pm and takes 30 min to fall asleep. He gets up a few times to urinate. Out of bed 5 am, when wakes in am feels tired. He takes a nap a few times per week.  He has no sleep attacks but has had syncopal episodes which were worked up and was noted to be due to aflutter.   He has chronic back problems, he takes flexeril less than once per week. He was prescribed lexapro for depression and took it for some time, but is not taking it right now because he wants to be on less medication. He has a prescription for xanax but only took it for MRI.    PMHX:   Past Medical History:  Diagnosis Date  .  BPH (benign prostatic hyperplasia) 11/19/2013  . 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 Bulging disc    C2/3, C3/4, C6/7  . Cervical disc herniation    C4/5 and C5/6  . Colon polyp 04/18/2011  . Constipation 08/31/2012  . Diverticulosis   . 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 GERD (gastroesophageal reflux disease)   . H/O diverticulitis of colon   . Hemorrhoid   . Hiatal hernia   . Hyperlipidemia   . Levoscoliosis   . Spinal stenosis in cervical region    cord abutment C4/5  . Testicular pain, left   . Typical atrial flutter ([place]) 11/2015   a. CHADS2VASc => 0; b. on Eliquis for pending ablation    Surgical Hx:  Past Surgical History:  Procedure Laterality Date  . GANGLION CYST EXCISION Right 1994   wrist & back  . HERNIA REPAIR Left 1994   abdominal repair with mesh  . NASAL SEPTUM SURGERY  2011   Dr. [name]    Family Hx:  Family History  Problem Relation Age of Onset  . Hyperlipidemia Mother   . Cancer Mother     skin cancer?  . Heart disease Mother 26    MI - died in her sleep  . Hyperlipidemia Father   . Heart disease Father   . Kidney disease Neg Hx   .  Prostate cancer Neg Hx    Social Hx:   Social History  Substance Use Topics  . Smoking status: Former Smoker    Years: 20.00    Types: Cigarettes    Quit date: 02/08/1991  . Smokeless tobacco: Never Used  . Alcohol use No   Medication:    reviewed   Allergies:  Other  Review of Systems: Gen:  Denies  fever, sweats, chills HEENT: Denies blurred vision, double vision. bleeds, sore throat Cvc:  No dizziness, chest pain. Resp:   Denies cough or sputum production, shortness of breath Gi: Denies swallowing difficulty, stomach pain. Gu:  Denies bladder incontinence, burning urine Ext:   No Joint pain, stiffness. Skin: No skin rash,  hives  Endoc:  No polyuria, polydipsia. Psych: No depression, insomnia. Other:  All other systems were reviewed with the  patient and were negative other that what is mentioned in the HPI.   Physical Examination:   VS: BP 124/78 (BP Location: Left Arm, Cuff Size: Normal)   Pulse 99   Ht 5\' 10"  (1.778 m)   Wt 199 lb (90.3 kg)   SpO2 100%   BMI 28.55 kg/m   General Appearance: No distress  Neuro:without focal findings,  speech normal,  HEENT: PERRLA, EOM intact.   Pulmonary: normal breath sounds, No wheezing.  CardiovascularNormal S1,S2.  No m/r/g.   Abdomen: Benign, Soft, non-tender. Renal:  No costovertebral tenderness  GU:  No performed at this time. Endoc: No evident thyromegaly, no signs of acromegaly. Skin:   warm, no rashes, no ecchymosis  Extremities: normal, no cyanosis, clubbing.  Other findings:    LABORATORY PANEL:   CBC  Recent Labs Lab 12/09/15 1418  WBC 6.9  HCT 43.2  PLT 240   ------------------------------------------------------------------------------------------------------------------  Chemistries   Recent Labs Lab 12/09/15 1418  NA 142  K 4.3  CL 102  CO2 25  GLUCOSE 130*  BUN 15  CREATININE 1.07  CALCIUM 9.0   ------------------------------------------------------------------------------------------------------------------  Cardiac Enzymes No results for input(s): TROPONINI in the last 168 hours. ------------------------------------------------------------  RADIOLOGY:  No results found.     Thank  you for the consultation and for allowing [facility] to assist in the care of your patient. Our recommendations are noted above.  Please contact us if we can be of further service.   Marda Stalker, MD.  Board Certified in Internal Medicine, Pulmonary Medicine, [place], and Sleep Medicine.  [facility] Office Number: [phone]  Patricia Pesa, M.D.  Vilinda Boehringer, M.D.  Merton Border, M.D  12/14/2015

## 2015-12-17 ENCOUNTER — Encounter: Payer: Self-pay | Admitting: Internal Medicine

## 2015-12-17 ENCOUNTER — Ambulatory Visit (INDEPENDENT_AMBULATORY_CARE_PROVIDER_SITE_OTHER): Payer: Medicare Other | Admitting: Internal Medicine

## 2015-12-17 ENCOUNTER — Other Ambulatory Visit (INDEPENDENT_AMBULATORY_CARE_PROVIDER_SITE_OTHER): Payer: Medicare Other

## 2015-12-17 VITALS — BP 124/78 | HR 99 | Ht 70.0 in | Wt 199.0 lb

## 2015-12-17 DIAGNOSIS — I480 Paroxysmal atrial fibrillation: Secondary | ICD-10-CM | POA: Diagnosis not present

## 2015-12-17 DIAGNOSIS — I4892 Unspecified atrial flutter: Secondary | ICD-10-CM | POA: Diagnosis not present

## 2015-12-17 DIAGNOSIS — G4719 Other hypersomnia: Secondary | ICD-10-CM

## 2015-12-17 NOTE — Patient Instructions (Addendum)
--

## 2015-12-18 LAB — DIGOXIN LEVEL: Digoxin, Serum: 0.6 ng/mL (ref 0.5–0.9)

## 2015-12-21 ENCOUNTER — Other Ambulatory Visit: Payer: Self-pay

## 2015-12-21 DIAGNOSIS — I4892 Unspecified atrial flutter: Secondary | ICD-10-CM

## 2015-12-24 ENCOUNTER — Encounter: Payer: Self-pay | Admitting: Internal Medicine

## 2015-12-24 ENCOUNTER — Ambulatory Visit (INDEPENDENT_AMBULATORY_CARE_PROVIDER_SITE_OTHER): Payer: Medicare Other | Admitting: Family Medicine

## 2015-12-24 ENCOUNTER — Ambulatory Visit (INDEPENDENT_AMBULATORY_CARE_PROVIDER_SITE_OTHER): Payer: Medicare Other | Admitting: Internal Medicine

## 2015-12-24 ENCOUNTER — Encounter: Payer: Self-pay | Admitting: Family Medicine

## 2015-12-24 VITALS — BP 120/88 | HR 71 | Ht 70.0 in | Wt 199.2 lb

## 2015-12-24 DIAGNOSIS — R972 Elevated prostate specific antigen [PSA]: Secondary | ICD-10-CM | POA: Diagnosis not present

## 2015-12-24 DIAGNOSIS — I499 Cardiac arrhythmia, unspecified: Secondary | ICD-10-CM

## 2015-12-24 DIAGNOSIS — F4323 Adjustment disorder with mixed anxiety and depressed mood: Secondary | ICD-10-CM

## 2015-12-24 DIAGNOSIS — I4892 Unspecified atrial flutter: Secondary | ICD-10-CM | POA: Diagnosis not present

## 2015-12-24 NOTE — Assessment & Plan Note (Signed)
Patient is rate controlled today. Continues to have intermittent symptoms of lightheadedness and sweatiness. No noted palpitations or chest pain. Discussed doing an EKG today though he wanted to defer to his cardiology appointment later this morning. We did do orthostatics which were positive which could be contributing some to his lightheadedness. Discussed having him discuss this with his cardiologist to see if backing off on his diltiazem could be beneficial. He will see cardiology later today. He is given return precautions.

## 2015-12-24 NOTE — Assessment & Plan Note (Signed)
Patient continues to have issues with this. He notes no SI or HI. Has not been taking Lexapro. Xanax does help with his symptoms of anxiety at night. Discussed continuing this as needed. Discussed getting through his cardiac issues and then considering seeing a therapist. Also advised that we could try another SSRI though he would like to wait on this at this time. He is given return precautions.

## 2015-12-24 NOTE — Progress Notes (Signed)
Pre visit review using our clinic review tool, if applicable. No additional management support is needed unless otherwise documented below in the visit note. 

## 2015-12-24 NOTE — Progress Notes (Signed)
  [name], MD Phone: [phone]  [name]. is a 60 y.o. male who presents today for follow-up.  Atrial flutter: Recently diagnosed and hospitalized with this. Started on Eliquis and digoxin and diltiazem. Currently taking extended-release diltiazem. Notes he continues to intermittently get lightheaded and sweaty at times. Did have some lightheadedness earlier today and on orthostatics in the office. No palpitations or chest pain with this. Occasional shortness of breath with his lightheadedness. He sees cardiology later today. He has no shortness of breath or chest pain at this time. No palpitations at this time.  Anxiety/depression: Has not been taking Lexapro. Does take Xanax. Has only taken 3-4 the tablets that I prescribed to him since the last time I saw him. Anxiety some of the time regarding life situations. Also some depression. No SI or HI.  Elevated PSA: Slight elevation compared to previously. He is following with urology. He is on finasteride. He follows up with them next week. There considering whether or not to do a biopsy.  PMH: Former smoker   ROS see history of present illness  Objective  Physical Exam Vitals:   12/24/15 0834  BP: 118/82  Pulse: (!) 54  Temp: 97.9 F (36.6 C)   Laying blood pressure 136/92 pulse 59 Sitting blood pressure 130/92 pulse 51 Standing blood pressure 112/80 pulse 88, pulse was 44 when he initially started up and then increased  BP Readings from Last 3 Encounters:  12/24/15 118/82  12/17/15 124/78  12/09/15 112/68   Wt Readings from Last 3 Encounters:  12/24/15 200 lb 6.4 oz (90.9 kg)  12/17/15 199 lb (90.3 kg)  12/09/15 199 lb 8 oz (90.5 kg)    Physical Exam  Constitutional: He is well-developed, well-nourished, and in no distress.  HENT:  Head: Normocephalic and atraumatic.  Mouth/Throat: Oropharynx is clear and moist. No oropharyngeal exudate.  Eyes: Conjunctivae are normal. Pupils are equal, round, and  reactive to light.  Cardiovascular: Normal rate and normal heart sounds.  An irregularly irregular rhythm present.  Pulmonary/Chest: Effort normal and breath sounds normal.  Musculoskeletal: He exhibits no edema.  Neurological: He is alert. Gait normal.  Skin: Skin is warm and dry.     Assessment/Plan: Please see individual problem list.  Atrial flutter ([place]) Patient is rate controlled today. Continues to have intermittent symptoms of lightheadedness and sweatiness. No noted palpitations or chest pain. Discussed doing an EKG today though he wanted to defer to his cardiology appointment later this morning. We did do orthostatics which were positive which could be contributing some to his lightheadedness. Discussed having him discuss this with his cardiologist to see if backing off on his diltiazem could be beneficial. He will see cardiology later today. He is given return precautions.  Adjustment disorder with mixed anxiety and depressed mood Patient continues to have issues with this. He notes no SI or HI. Has not been taking Lexapro. Xanax does help with his symptoms of anxiety at night. Discussed continuing this as needed. Discussed getting through his cardiac issues and then considering seeing a therapist. Also advised that we could try another SSRI though he would like to wait on this at this time. He is given return precautions.  Elevated PSA Slight elevation recently. Currently on finasteride. Following with urology. He will follow up with them next week.   [name], MD [place]

## 2015-12-24 NOTE — Patient Instructions (Signed)
Nice to see you. Please continue Xanax. When you need a refill please let us know. You are orthostatic which is likely causing your lightheadedness. This is likely related to the medication you're taking for your heart rate. I will include your orthostatic vital signs in this and then you will see the cardiologist later today. If you develop chest pain, shortness of breath, palpitations, persistent lightheadedness, or any new or changing symptoms please seek medical attention immediately.  Laying blood pressure 136/92 pulse 59 Sitting blood pressure 130/92 pulse 51 Standing blood pressure 112/80 pulse 88, though pulse initially dropped to 44 and then increased

## 2015-12-24 NOTE — Patient Instructions (Signed)
Medication Instructions: - Your physician has recommended you make the following change in your medication: 1) stop digoxin- after your cardioversion 2) stop cardizem (diltiazem)- after your cardioversion  Labwork: - none ordered  Procedures/Testing: - Your physician has recommended that you have a Cardioversion (DCCV). Electrical Cardioversion uses a jolt of electricity to your heart either through paddles or wired patches attached to your chest. This is a controlled, usually prescheduled, procedure. Defibrillation is done under light anesthesia in the hospital, and you usually go home the day of the procedure. This is done to get your heart back into a normal rhythm. You are not awake for the procedure.   You are scheduled for a Cardioversion on ________________ with Dr.___________ Please arrive at the [place] [facility] at _________ a.m. on the day of your procedure.  DIET INSTRUCTIONS:  Nothing to eat or drink after midnight except your medications with a              sip of water.         1) Labs: __________________  2) Medications:  YOU MAY TAKE ALL of your medications with a small amount of water the morning of your procedure  3) Must have a responsible person to drive you home.  4) Bring a current list of your medications and current insurance cards.    If you have any questions after you get home, please call the office at [phone]   - Your physician has recommended that you have an atrial flutter ablation- 4 weeks post cardioversion. Catheter ablation is a medical procedure used to treat some cardiac arrhythmias (irregular heartbeats). During catheter ablation, a long, thin, flexible tube is put into a blood vessel in your groin (upper thigh), or neck. This tube is called an ablation catheter. It is then guided to your heart through the blood vessel. Radio frequency waves destroy small areas of heart tissue where abnormal heartbeats may cause an arrhythmia to start.   **  Dr. Olin Pia nurse, Nira Conn, will call you to schedule this **   Follow-Up: - Pending procedure dates  Any Additional Special Instructions Will Be Listed Below (If Applicable).     If you need a refill on your cardiac medications before your next appointment, please call your pharmacy.

## 2015-12-24 NOTE — Assessment & Plan Note (Signed)
Slight elevation recently. Currently on finasteride. Following with urology. He will follow up with them next week.

## 2015-12-24 NOTE — Progress Notes (Signed)
ELECTROPHYSIOLOGY CONSULT NOTE  Patient ID: [name]., MRN: [id number], DOB/AGE: 1955/04/14 60 y.o. Admit date: (Not on file) Date of Consult: 12/24/2015  Primary Ayala: Tyler Rumps, MD Primary Cardiologist: [name] Tyler  Chief Complaint: Atrial  flutter   HPI Tyler Loop. is a 60 y.o. male  Referred because of atrial flutter.  He presented to hospital 10/17 with a history of abrupt onset tachypalpitations accompanied by shortness of breath and worsening fatigue. He was found to be in atrial flutter with a rapid rate. TE cardioversion was recommended; however, he interpreted the sequence of events that he would have to be "shocked again "in order to undergo ablation. Thus, he deferred.[name] reviewed. He was discharged on a combination of Cardizem and Lanoxin and apixoban.  CHADS-VASc score 0  ECGs personally reviewed: Suggestive but not clearly typical atrial flutter with negative flutter waves inferiorly relatively isoelectric flutter waves in lead V1  Cardiac evaluation includes echocardiogram 8/17--EF 50-55%; LAE-(45/2.1/26) Myoview 8/17 no ischemia  He is disabled because of chronic neck and back pain. Past Medical History:  Diagnosis Date  . BPH (benign prostatic hyperplasia) 11/19/2013  . 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 Bulging disc    C2/3, C3/4, C6/7  . Cervical disc herniation    C4/5 and C5/6  . Colon polyp 04/18/2011  . Constipation 08/31/2012  . Diverticulosis   . 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 GERD (gastroesophageal reflux disease)   . H/O diverticulitis of colon   . Hemorrhoid   . Hiatal hernia   . Hyperlipidemia   . Levoscoliosis   . Spinal stenosis in cervical region    cord abutment C4/5  . Testicular pain, left   . Typical atrial flutter ([name]) 11/2015   a. CHADS2VASc => 0; b. on Eliquis for pending ablation       Surgical History:  Past  Surgical History:  Procedure Laterality Date  . GANGLION CYST EXCISION Right 1994   wrist & back  . HERNIA REPAIR Left 1994   abdominal repair with mesh  . NASAL SEPTUM SURGERY  2011   Dr. Richardson Ayala      Home Meds: Prior to Admission medications   Medication Sig Start Date End Date Taking? Authorizing Provider  ALPRAZolam Duanne Moron) 0.5 MG tablet Take 1 tablet (0.5 mg total) by mouth at bedtime as needed for anxiety. 10/27/15   Tyler Haven, MD  apixaban (ELIQUIS) 5 MG TABS tablet Take 1 tablet (5 mg total) by mouth 2 (two) times daily. 12/09/15   Tyler Haber Dunn, [name]  atorvastatin (LIPITOR) 10 MG tablet Take 1 tablet (10 mg total) by mouth every morning. 12/07/15   Tyler Haven, MD  cyclobenzaprine (FLEXERIL) 10 MG tablet TAKE 1 TABLET(10 MG) BY MOUTH THREE TIMES DAILY AS NEEDED FOR MUSCLE SPASMS Patient taking differently: Take 10 mg by mouth 3 (three) times daily as needed for muscle spasms.  09/21/15   Tyler Haven, MD  digoxin (LANOXIN) 0.25 MG tablet Take 0.5 tablets (0.125 mg total) by mouth daily. 12/10/15   Tyler Haber Dunn, [name]  diltiazem (CARDIZEM CD) 120 MG 24 hr capsule Take 1 capsule (120 mg total) by mouth daily. 12/09/15   Tyler Haber Dunn, [name]  diltiazem (CARDIZEM) 30 MG tablet Take 1 tablet (30 mg total) by mouth every 8 (eight) hours as needed (for tachycardia). 12/09/15   Tyler Mu, [name]  escitalopram (LEXAPRO) 20 MG tablet Take 1 tablet (20 mg total) by mouth daily. 10/27/15   Tyler Haven, MD  finasteride (PROSCAR) 5 MG tablet Take 1 tablet (5 mg total) by mouth daily. 11/25/15   Tyler Riis, [name]  meloxicam (MOBIC) 15 MG tablet Take 15 mg by mouth daily as needed for pain.    Historical Provider, MD  mirabegron ER (MYRBETRIQ) 50 MG TB24 tablet Take 50 mg by mouth daily.    Historical Provider, MD  pantoprazole (PROTONIX) 40 MG tablet Take 1 tablet (40 mg total) by mouth daily. 10/01/15   Tyler Haven, MD  Probiotic Product (ALIGN) 4 MG CAPS Take 1 capsule by  mouth daily Patient taking differently: Take 1 capsule by mouth daily as needed. For bowel regularity 06/12/15   Tyler Confer, MD  tadalafil (CIALIS) 10 MG tablet Take 1 tablet (10 mg total) by mouth as needed. Patient taking differently: Take 10 mg by mouth daily.  09/28/15   Tyler Haven, MD  triamcinolone (NASACORT) 55 MCG/ACT AERO nasal inhaler Place 2 sprays into the nose daily as needed. For allergies    Historical Provider, MD    Allergies:  Allergies  Allergen Reactions  . Other Other (See Comments)    Pollen:  Sinus infection, cough, fatigue    Social History   Social History  . Marital status: Married    Spouse name: [name]  . Number of children: 1  . Years of education: GED   Occupational History  . Disability     Herniated disk, spinal stenosis   Social History Main Topics  . Smoking status: Former Smoker    Years: 20.00    Types: Cigarettes    Quit date: 02/08/1991  . Smokeless tobacco: Never Used  . Alcohol use No  . Drug use: No  . Sexual activity: Yes    Partners: Female   Other Topics Concern  . Not on file   Social History Narrative   [name] was born and raised in [place], [place]. He quit school after the 8th grade because of financial hardship. He was working since age 53. He went back and got his GED. He moved to [place] in 2006. He lives at home with his wife of 81 years and their 62 year old daughter. Previously, [name] was married to his first wife for 20 years and they divorced. They had no children.  [name] has been disabled 2/2 back problems.       Family History  Problem Relation Age of Onset  . Hyperlipidemia Mother   . Cancer Mother     skin cancer?  . Heart disease Mother 65    MI - died in her sleep  . Hyperlipidemia Father   . Heart disease Father   . Kidney disease Neg Hx   . Prostate cancer Neg Hx      ROS:  Please see the history of present illness.     All other systems reviewed and negative.    Physical Exam: Blood  pressure 120/88, pulse 71, height 5\' 10"  (1.778 m), weight 199 lb 4 oz (90.4 kg). General: Well developed, well nourished male in no acute distress. Head: Normocephalic, atraumatic, sclera non-icteric, no xanthomas, nares are without discharge. EENT: normal  Lymph Nodes:  none Neck: Negative for carotid bruits. JVD not elevated. Back:without scoliosis kyphosis Lungs: Clear bilaterally to auscultation without wheezes, rales, or rhonchi. Breathing is unlabored. Heart: RRR with S1 S2. No  murmur . No  rubs, or gallops appreciated. Abdomen: Soft, non-tender, non-distended with normoactive bowel sounds. No hepatomegaly. No rebound/guarding. No obvious abdominal masses. Msk:  Strength and tone appear normal for age. Extremities: No clubbing or cyanosis. No edema.  Distal pedal pulses are 2+ and equal bilaterally. Skin: Warm and Dry Neuro: Alert and oriented X 3. CN III-XII intact Grossly normal sensory and motor function . Psych:  Responds to questions appropriately with a normal affect.      Labs: Cardiac Enzymes No results for input(s): CKTOTAL, CKMB, TROPONINI in the last 72 hours. CBC Lab Results  Component Value Date   WBC 6.9 12/09/2015   HGB 16.6 12/01/2015   HCT 43.2 12/09/2015   MCV 86 12/09/2015   PLT 240 12/09/2015   PROTIME: No results for input(s): LABPROT, INR in the last 72 hours. Chemistry No results for input(s): NA, K, CL, CO2, BUN, CREATININE, CALCIUM, PROT, BILITOT, ALKPHOS, ALT, AST, GLUCOSE in the last 168 hours.  Invalid input(s): LABALBU Lipids Lab Results  Component Value Date   CHOL 159 12/01/2015   HDL 43 12/01/2015   LDLCALC 98 12/01/2015   TRIG 91 12/01/2015   BNP No results found for: PROBNP Thyroid Function Tests: No results for input(s): TSH, T4TOTAL, T3FREE, THYROIDAB in the last 72 hours.  Invalid input(s): FREET3 Miscellaneous No results found for: DDIMER  Radiology/Studies:  Dg Chest Port 1 View  Result Date: 11/29/2015 CLINICAL  DATA:  Cough, shortness of Breath EXAM: PORTABLE CHEST 1 VIEW COMPARISON:  11/20/2013 FINDINGS: Cardiomediastinal silhouette is stable. Mild hyperinflation. No infiltrate or pulmonary edema. Mild basilar reticular atelectasis or scarring. IMPRESSION: No active disease. Mild hyperinflation. Mild basilar reticular atelectasis or scarring. Electronically Signed   By: Lahoma Crocker M.D.   On: 11/29/2015 09:16    EKG: Atrial flutter with variable block. Flutter waves are more typical sawtooth seen inferiorly and positive flutter waves in lead V1  Dig level 11/17 0.6   Assessment and Plan:  Atrial flutter   The patient has symptomatically atrial flutter not withstanding control of his ventricular rate. It is appropriate to consider catheter ablation. Given his chronic back pain we would anticipate doing this with general anesthesia. In anticipation, I recommended cardioversion. This allows both the procedure to be done off anticoagulation thereby decreasing bleeding risks as well as sooner rather than later restoring a sinus rhythm, getting him off some of his rate controlling medications and help him to feel better.  We have discussed potential benefits and risks of catheter ablation as well as thromboembolic risks associated with atrial flutter and a CHADS-VASc score of 0:  he understands that anticoagulation is only indicated in the context of his anticipated cardioversion.   a sleep study is scheduled in the next couple of weeks.    [name]

## 2015-12-25 ENCOUNTER — Ambulatory Visit: Payer: Medicare Other | Admitting: Internal Medicine

## 2015-12-28 ENCOUNTER — Other Ambulatory Visit: Payer: Medicare Other

## 2015-12-28 DIAGNOSIS — R972 Elevated prostate specific antigen [PSA]: Secondary | ICD-10-CM | POA: Diagnosis not present

## 2015-12-29 ENCOUNTER — Other Ambulatory Visit: Payer: Self-pay

## 2015-12-29 ENCOUNTER — Telehealth: Payer: Self-pay

## 2015-12-29 DIAGNOSIS — R972 Elevated prostate specific antigen [PSA]: Secondary | ICD-10-CM

## 2015-12-29 LAB — PSA: Prostate Specific Ag, Serum: 2.8 ng/mL (ref 0.0–4.0)

## 2015-12-29 MED ORDER — FINASTERIDE 5 MG PO TABS: 5.0000 mg | ORAL_TABLET | Freq: Every day | ORAL | 3 refills | Status: DC

## 2015-12-29 NOTE — Telephone Encounter (Signed)
-----   Message from [name], [name] sent at 12/29/2015  8:48 AM EST ----- Please notify the patient that his PSA is reducing.  He needs to continue the finasteride and would like to see him in March for a PSA and office visit.

## 2015-12-29 NOTE — Telephone Encounter (Signed)
Spoke with pt in reference to PSA, finasteride and needing an appt in march. Pt voiced understanding. Pt requested appt be made and send him the appt info.

## 2016-01-04 ENCOUNTER — Ambulatory Visit: Payer: Medicare Other | Admitting: Anesthesiology

## 2016-01-04 ENCOUNTER — Ambulatory Visit
Admission: RE | Admit: 2016-01-04 | Discharge: 2016-01-04 | Disposition: A | Discharge status: Home / Self Care | Payer: Medicare Other | Source: Ambulatory Visit | Attending: Cardiovascular Disease | Admitting: Cardiovascular Disease

## 2016-01-04 ENCOUNTER — Other Ambulatory Visit: Payer: Medicare Other

## 2016-01-04 ENCOUNTER — Encounter
Admission: RE | Disposition: A | Discharge status: Home / Self Care | Payer: Self-pay | Source: Ambulatory Visit | Attending: Cardiovascular Disease

## 2016-01-04 ENCOUNTER — Encounter: Payer: Self-pay | Admitting: Cardiovascular Disease

## 2016-01-04 DIAGNOSIS — Z6828 Body mass index (BMI) 28.0-28.9, adult: Secondary | ICD-10-CM | POA: Diagnosis not present

## 2016-01-04 DIAGNOSIS — I4892 Unspecified atrial flutter: Secondary | ICD-10-CM | POA: Diagnosis not present

## 2016-01-04 DIAGNOSIS — I4891 Unspecified atrial fibrillation: Secondary | ICD-10-CM | POA: Diagnosis not present

## 2016-01-04 DIAGNOSIS — Z79899 Other long term (current) drug therapy: Secondary | ICD-10-CM | POA: Diagnosis not present

## 2016-01-04 DIAGNOSIS — I483 Typical atrial flutter: Secondary | ICD-10-CM | POA: Diagnosis not present

## 2016-01-04 DIAGNOSIS — N529 Male erectile dysfunction, unspecified: Secondary | ICD-10-CM | POA: Insufficient documentation

## 2016-01-04 DIAGNOSIS — E785 Hyperlipidemia, unspecified: Secondary | ICD-10-CM | POA: Diagnosis not present

## 2016-01-04 DIAGNOSIS — Z8249 Family history of ischemic heart disease and other diseases of the circulatory system: Secondary | ICD-10-CM | POA: Diagnosis not present

## 2016-01-04 DIAGNOSIS — G8929 Other chronic pain: Secondary | ICD-10-CM | POA: Diagnosis not present

## 2016-01-04 DIAGNOSIS — K219 Gastro-esophageal reflux disease without esophagitis: Secondary | ICD-10-CM | POA: Insufficient documentation

## 2016-01-04 DIAGNOSIS — Z87891 Personal history of nicotine dependence: Secondary | ICD-10-CM | POA: Insufficient documentation

## 2016-01-04 DIAGNOSIS — M549 Dorsalgia, unspecified: Secondary | ICD-10-CM | POA: Diagnosis not present

## 2016-01-04 DIAGNOSIS — M542 Cervicalgia: Secondary | ICD-10-CM | POA: Insufficient documentation

## 2016-01-04 DIAGNOSIS — E669 Obesity, unspecified: Secondary | ICD-10-CM | POA: Diagnosis not present

## 2016-01-04 DIAGNOSIS — I499 Cardiac arrhythmia, unspecified: Secondary | ICD-10-CM

## 2016-01-04 DIAGNOSIS — Z8679 Personal history of other diseases of the circulatory system: Secondary | ICD-10-CM

## 2016-01-04 HISTORY — PX: ELECTROPHYSIOLOGIC STUDY: SHX172A

## 2016-01-04 SURGERY — CARDIOVERSION (CATH LAB)
Anesthesia: General

## 2016-01-04 MED ORDER — PROPOFOL 10 MG/ML IV BOLUS
INTRAVENOUS | Status: DC | PRN
  Administered 2016-01-04: 80 mg via INTRAVENOUS

## 2016-01-04 MED ORDER — APIXABAN 5 MG PO TABS
5.0000 mg | ORAL_TABLET | Freq: Once | ORAL | Status: AC
  Administered 2016-01-04: 5 mg via ORAL
  Filled 2016-01-04: qty 1

## 2016-01-04 MED ORDER — SODIUM CHLORIDE 0.9 % IV SOLN: INTRAVENOUS | Status: DC

## 2016-01-04 MED ORDER — SODIUM CHLORIDE 0.9 % IV SOLN
INTRAVENOUS | Status: DC
  Administered 2016-01-04: 07:00:00 via INTRAVENOUS

## 2016-01-04 NOTE — Discharge Instructions (Signed)
Electrical Cardioversion, Care After °This sheet gives you information about how to care for yourself after your procedure. Your health care provider may also give you more specific instructions. If you have problems or questions, contact your health care provider. °What can I expect after the procedure? °After the procedure, it is common to have: °· Some redness on the skin where the shocks were given. °Follow these instructions at home: °· Do not drive for 24 hours if you were given a medicine to help you relax (sedative). °· Take over-the-counter and prescription medicines only as told by your health care provider. °· Ask your health care provider how to check your pulse. Check it often. °· Rest for 48 hours after the procedure or as told by your health care provider. °· Avoid or limit your caffeine use as told by your health care provider. °Contact a health care provider if: °· You feel like your heart is beating too quickly or your pulse is not regular. °· You have a serious muscle cramp that does not go away. °Get help right away if: °· You have discomfort in your chest. °· You are dizzy or you feel faint. °· You have trouble breathing or you are short of breath. °· Your speech is slurred. °· You have trouble moving an arm or leg on one side of your body. °· Your fingers or toes turn cold or blue. °This information is not intended to replace advice given to you by your health care provider. Make sure you discuss any questions you have with your health care provider. °Document Released: 11/14/2012 Document Revised: 08/28/2015 Document Reviewed: 07/31/2015 °Elsevier Interactive Patient Education © 2017 [employer] ° °

## 2016-01-04 NOTE — Anesthesia Postprocedure Evaluation (Signed)
Anesthesia Post Note  Patient: Gauge Skluzacek.  Procedure(s) Performed: Procedure(s) (LRB): Cardioversion (N/A)  Patient location during evaluation: Other Anesthesia Type: General Level of consciousness: awake and alert Pain management: pain level controlled Vital Signs Assessment: post-procedure vital signs reviewed and stable Respiratory status: spontaneous breathing, nonlabored ventilation and respiratory function stable Cardiovascular status: blood pressure returned to baseline and stable Postop Assessment: no signs of nausea or vomiting Anesthetic complications: no    Last Vitals:  Vitals:   01/04/16 0744 01/04/16 0745  BP:  (!) 159/79  Pulse: 60 (!) 55  Resp:  14  Temp:      Last Pain:  Vitals:   01/04/16 0642  TempSrc: Oral                 Yamila Cragin

## 2016-01-04 NOTE — H&P (View-Only) (Signed)
ELECTROPHYSIOLOGY CONSULT NOTE  Patient ID: [name]., MRN: [id number], DOB/AGE: 1955/08/18 60 y.o. Admit date: (Not on file) Date of Consult: 12/24/2015  Primary Physician: [name], MD Primary Cardiologist: TG Consulting Physician TG  Chief Complaint: Atrial  flutter   HPI [name]. is a 60 y.o. male  Referred because of atrial flutter.  He presented to hospital 10/17 with a history of abrupt onset tachypalpitations accompanied by shortness of breath and worsening fatigue. He was found to be in atrial flutter with a rapid rate. TE cardioversion was recommended; however, he interpreted the sequence of events that he would have to be "shocked again "in order to undergo ablation. Thus, he deferred.[name]. He was discharged on a combination of Cardizem and Lanoxin and apixoban.  CHADS-VASc score 0  ECGs personally reviewed: Suggestive but not clearly typical atrial flutter with negative flutter waves inferiorly relatively isoelectric flutter waves in lead V1  Cardiac evaluation includes echocardiogram 8/17--EF 50-55%; LAE-(45/2.1/26) Myoview 8/17 no ischemia  He is disabled because of chronic neck and back pain. Past Medical History:  Diagnosis Date  . BPH (benign prostatic hyperplasia) 11/19/2013  . 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 Bulging disc    C2/3, C3/4, C6/7  . Cervical disc herniation    C4/5 and C5/6  . Colon polyp 04/18/2011  . Constipation 08/31/2012  . Diverticulosis   . 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 GERD (gastroesophageal reflux disease)   . H/O diverticulitis of colon   . Hemorrhoid   . Hiatal hernia   . Hyperlipidemia   . Levoscoliosis   . Spinal stenosis in cervical region    cord abutment C4/5  . Testicular pain, left   . Typical atrial flutter ([place]) 11/2015   a. CHADS2VASc => 0; b. on Eliquis for pending ablation       Surgical History:  Past  Surgical History:  Procedure Laterality Date  . GANGLION CYST EXCISION Right 1994   wrist & back  . HERNIA REPAIR Left 1994   abdominal repair with mesh  . NASAL SEPTUM SURGERY  2011   Dr. [name]      Home Meds: Prior to Admission medications   Medication Sig Start Date End Date Taking? Authorizing Provider  ALPRAZolam Duanne Moron) 0.5 MG tablet Take 1 tablet (0.5 mg total) by mouth at bedtime as needed for anxiety. 10/27/15   [name], MD  apixaban (ELIQUIS) 5 MG TABS tablet Take 1 tablet (5 mg total) by mouth 2 (two) times daily. 12/09/15   [name], [name]  atorvastatin (LIPITOR) 10 MG tablet Take 1 tablet (10 mg total) by mouth every morning. 12/07/15   [name], MD  cyclobenzaprine (FLEXERIL) 10 MG tablet TAKE 1 TABLET(10 MG) BY MOUTH THREE TIMES DAILY AS NEEDED FOR MUSCLE SPASMS Patient taking differently: Take 10 mg by mouth 3 (three) times daily as needed for muscle spasms.  09/21/15   [name], MD  digoxin (LANOXIN) 0.25 MG tablet Take 0.5 tablets (0.125 mg total) by mouth daily. 12/10/15   [name], [name]  diltiazem (CARDIZEM CD) 120 MG 24 hr capsule Take 1 capsule (120 mg total) by mouth daily. 12/09/15   [name], [name]  diltiazem (CARDIZEM) 30 MG tablet Take 1 tablet (30 mg total) by mouth every 8 (eight) hours as needed (for tachycardia). 12/09/15   [name], [name]  escitalopram (LEXAPRO) 20 MG tablet Take 1 tablet (20 mg total) by mouth daily. 10/27/15   [name], MD  finasteride (PROSCAR) 5 MG tablet Take 1 tablet (5 mg total) by mouth daily. 11/25/15   [name], [name]  meloxicam (MOBIC) 15 MG tablet Take 15 mg by mouth daily as needed for pain.    Historical Provider, MD  mirabegron ER (MYRBETRIQ) 50 MG TB24 tablet Take 50 mg by mouth daily.    Historical Provider, MD  pantoprazole (PROTONIX) 40 MG tablet Take 1 tablet (40 mg total) by mouth daily. 10/01/15   [name], MD  Probiotic Product (ALIGN) 4 MG CAPS Take 1 capsule by  mouth daily Patient taking differently: Take 1 capsule by mouth daily as needed. For bowel regularity 06/12/15   [name], MD  tadalafil (CIALIS) 10 MG tablet Take 1 tablet (10 mg total) by mouth as needed. Patient taking differently: Take 10 mg by mouth daily.  09/28/15   [name], MD  triamcinolone (NASACORT) 55 MCG/ACT AERO nasal inhaler Place 2 sprays into the nose daily as needed. For allergies    Historical Provider, MD    Allergies:  Allergies  Allergen Reactions  . Other Other (See Comments)    Pollen:  Sinus infection, cough, fatigue    Social History   Social History  . Marital status: Married    Spouse name: [name]  . Number of children: 1  . Years of education: GED   Occupational History  . Disability     Herniated disk, spinal stenosis   Social History Main Topics  . Smoking status: Former Smoker    Years: 20.00    Types: Cigarettes    Quit date: 02/08/1991  . Smokeless tobacco: Never Used  . Alcohol use No  . Drug use: No  . Sexual activity: Yes    Partners: Female   Other Topics Concern  . Not on file   Social History Narrative   Kestin was born and raised in [place], [place]. He quit school after the 8th grade because of financial hardship. He was working since age 26. He went back and got his GED. He moved to [place] in 2006. He lives at home with his wife of 32 years and their 40 year old daughter. Previously, [name] was married to his first wife for 20 years and they divorced. They had no children.  Augusten has been disabled 2/2 back problems.       Family History  Problem Relation Age of Onset  . Hyperlipidemia Mother   . Cancer Mother     skin cancer?  . Heart disease Mother 20    MI - died in her sleep  . Hyperlipidemia Father   . Heart disease Father   . Kidney disease Neg Hx   . Prostate cancer Neg Hx      ROS:  Please see the history of present illness.     All other systems reviewed and negative.    Physical Exam: Blood  pressure 120/88, pulse 71, height 5\' 10"  (1.778 m), weight 199 lb 4 oz (90.4 kg). General: Well developed, well nourished male in no acute distress. Head: Normocephalic, atraumatic, sclera non-icteric, no xanthomas, nares are without discharge. EENT: normal  Lymph Nodes:  none Neck: Negative for carotid bruits. JVD not elevated. Back:without scoliosis kyphosis Lungs: Clear bilaterally to auscultation without wheezes, rales, or rhonchi. Breathing is unlabored. Heart: RRR with S1 S2. No  murmur . No  rubs, or gallops appreciated. Abdomen: Soft, non-tender, non-distended with normoactive bowel sounds. No hepatomegaly. No rebound/guarding. No obvious abdominal masses. Msk:  Strength and tone appear normal for age. Extremities: No clubbing or cyanosis. No edema.  Distal pedal pulses are 2+ and equal bilaterally. Skin: Warm and Dry Neuro: Alert and oriented X 3. CN III-XII intact Grossly normal sensory and motor function . Psych:  Responds to questions appropriately with a normal affect.      Labs: Cardiac Enzymes No results for input(s): CKTOTAL, CKMB, TROPONINI in the last 72 hours. CBC Lab Results  Component Value Date   WBC 6.9 12/09/2015   HGB 16.6 12/01/2015   HCT 43.2 12/09/2015   MCV 86 12/09/2015   PLT 240 12/09/2015   PROTIME: No results for input(s): LABPROT, INR in the last 72 hours. Chemistry No results for input(s): NA, K, CL, CO2, BUN, CREATININE, CALCIUM, PROT, BILITOT, ALKPHOS, ALT, AST, GLUCOSE in the last 168 hours.  Invalid input(s): LABALBU Lipids Lab Results  Component Value Date   CHOL 159 12/01/2015   HDL 43 12/01/2015   LDLCALC 98 12/01/2015   TRIG 91 12/01/2015   BNP No results found for: PROBNP Thyroid Function Tests: No results for input(s): TSH, T4TOTAL, T3FREE, THYROIDAB in the last 72 hours.  Invalid input(s): FREET3 Miscellaneous No results found for: DDIMER  Radiology/Studies:  Dg Chest Port 1 View  Result Date: 11/29/2015 CLINICAL  DATA:  Cough, shortness of Breath EXAM: PORTABLE CHEST 1 VIEW COMPARISON:  11/20/2013 FINDINGS: Cardiomediastinal silhouette is stable. Mild hyperinflation. No infiltrate or pulmonary edema. Mild basilar reticular atelectasis or scarring. IMPRESSION: No active disease. Mild hyperinflation. Mild basilar reticular atelectasis or scarring. Electronically Signed   By: Lahoma Crocker M.D.   On: 11/29/2015 09:16    EKG: Atrial flutter with variable block. Flutter waves are more typical sawtooth seen inferiorly and positive flutter waves in lead V1  Dig level 11/17 0.6   Assessment and Plan:  Atrial flutter   The patient has symptomatically atrial flutter not withstanding control of his ventricular rate. It is appropriate to consider catheter ablation. Given his chronic back pain we would anticipate doing this with general anesthesia. In anticipation, I recommended cardioversion. This allows both the procedure to be done off anticoagulation thereby decreasing bleeding risks as well as sooner rather than later restoring a sinus rhythm, getting him off some of his rate controlling medications and help him to feel better.  We have discussed potential benefits and risks of catheter ablation as well as thromboembolic risks associated with atrial flutter and a CHADS-VASc score of 0:  he understands that anticoagulation is only indicated in the context of his anticipated cardioversion.   a sleep study is scheduled in the next couple of weeks.    Virl Axe

## 2016-01-04 NOTE — Anesthesia Preprocedure Evaluation (Signed)
Anesthesia Evaluation  Patient identified by MRN, date of birth, ID band Patient awake    Reviewed: Allergy & Precautions, NPO status , Patient's Chart, lab work & pertinent test results  History of Anesthesia Complications Negative for: history of anesthetic complications  Airway Mallampati: II  TM Distance: >3 FB Neck ROM: Full    Dental no notable dental hx.    Pulmonary neg sleep apnea, neg COPD, former smoker,    breath sounds clear to auscultation- rhonchi (-) wheezing      Cardiovascular Exercise Tolerance: Good hypertension, (-) CAD and (-) Past MI + dysrhythmias Atrial Fibrillation  Rhythm:Regular Rate:Normal - Systolic murmurs and - Diastolic murmurs    Neuro/Psych Anxiety negative neurological ROS     GI/Hepatic Neg liver ROS, hiatal hernia, GERD  ,  Endo/Other  negative endocrine ROSneg diabetes  Renal/GU negative Renal ROS     Musculoskeletal negative musculoskeletal ROS (+)   Abdominal (+) - obese,   Peds  Hematology negative hematology ROS (+)   Anesthesia Other Findings Past Medical History: 11/19/2013: BPH (benign prostatic hyperplasia) No date: Bulge of lumbar disc without myelopathy     Comment: L2/3 through L5/6 No date: Bulging disc     Comment: C2/3, C3/4, C6/7 No date: Cervical disc herniation     Comment: C4/5 and C5/6 04/18/2011: Colon polyp 08/31/2012: Constipation No date: Diverticulosis 01/04/2012: ED (erectile dysfunction) of organic origin No date: GERD (gastroesophageal reflux disease) No date: H/O diverticulitis of colon No date: Hemorrhoid No date: Hiatal hernia No date: Hyperlipidemia No date: Levoscoliosis No date: Spinal stenosis in cervical region     Comment: cord abutment C4/5 No date: Testicular pain, left 11/2015: Typical atrial flutter ([facility])     Comment: a. CHADS2VASc => 0; b. on Eliquis for pending               ablation    Reproductive/Obstetrics                              Anesthesia Physical Anesthesia Plan  ASA: II  Anesthesia Plan: General   Post-op Pain Management:    Induction: Intravenous  Airway Management Planned: Natural Airway  Additional Equipment:   Intra-op Plan:   Post-operative Plan:   Informed Consent: I have reviewed the patients History and Physical, chart, labs and discussed the procedure including the risks, benefits and alternatives for the proposed anesthesia with the patient or authorized representative who has indicated his/her understanding and acceptance.   Dental advisory given  Plan Discussed with: Anesthesiologist and CRNA  Anesthesia Plan Comments:         Anesthesia Quick Evaluation

## 2016-01-04 NOTE — Interval H&P Note (Signed)
History and Physical Interval Note:  01/04/2016 9:24 AM  [name].  has presented today for surgery, with the diagnosis of Cardioversion     Afib  The various methods of treatment have been discussed with the patient and family. After consideration of risks, benefits and other options for treatment, the patient has consented to  Procedure(s): Cardioversion (N/A) as a surgical intervention .  The patient's history has been reviewed, patient examined, no change in status, stable for surgery.  I have reviewed the patient's chart and labs.  Questions were answered to the patient's satisfaction.     Kathlyn Sacramento

## 2016-01-04 NOTE — Transfer of Care (Signed)
Immediate Anesthesia Transfer of Care Note  Patient: [name].  Procedure(s) Performed: Procedure(s): Cardioversion (N/A)  Patient Location: PACU  Anesthesia Type:General  Level of Consciousness: awake and sedated  Airway & Oxygen Therapy: Patient Spontanous Breathing  Post-op Assessment: Report given to RN and Post -op Vital signs reviewed and stable  Post vital signs: Reviewed and stable  Last Vitals:  Vitals:   01/04/16 0744 01/04/16 0745  BP:    Pulse: 60 (!) 55  Resp:    Temp:      Last Pain:  Vitals:   01/04/16 0642  TempSrc: Oral         Complications: No apparent anesthesia complications

## 2016-01-04 NOTE — CV Procedure (Signed)
Cardioversion note: A standard informed consent was obtained. Timeout was performed. The pads were placed in the anterior posterior fashion. The patient was given propofol by the anesthesia team.  Successful cardioversion was performed with a 100 J. The patient converted to sinus rhythm. Pre-and post EKGs were reviewed. The patient tolerated the procedure with no immediate complications.  Recommendations: Continue same medications and follow-up in 2-3 weeks.  

## 2016-01-04 NOTE — Telephone Encounter (Signed)
done

## 2016-01-05 ENCOUNTER — Telehealth: Payer: Self-pay | Admitting: Family Medicine

## 2016-01-05 ENCOUNTER — Ambulatory Visit: Payer: Medicare Other | Attending: Internal Medicine

## 2016-01-05 ENCOUNTER — Other Ambulatory Visit: Payer: Self-pay | Admitting: Family Medicine

## 2016-01-05 ENCOUNTER — Encounter: Payer: Self-pay | Admitting: Internal Medicine

## 2016-01-05 DIAGNOSIS — G4719 Other hypersomnia: Secondary | ICD-10-CM

## 2016-01-05 DIAGNOSIS — R0683 Snoring: Secondary | ICD-10-CM | POA: Diagnosis not present

## 2016-01-05 DIAGNOSIS — G4763 Sleep related bruxism: Secondary | ICD-10-CM | POA: Diagnosis not present

## 2016-01-05 DIAGNOSIS — G4733 Obstructive sleep apnea (adult) (pediatric): Secondary | ICD-10-CM | POA: Diagnosis not present

## 2016-01-05 NOTE — Telephone Encounter (Signed)
Pt was supposed to have seen Denisa. Thank you!

## 2016-01-06 NOTE — Telephone Encounter (Signed)
RX faxed

## 2016-01-06 NOTE — Telephone Encounter (Signed)
Please fax refill.

## 2016-01-06 NOTE — Telephone Encounter (Signed)
Last filled 10/27/15. Last OV 12/24/15.

## 2016-01-07 ENCOUNTER — Encounter: Payer: Self-pay | Admitting: *Deleted

## 2016-01-07 ENCOUNTER — Telehealth: Payer: Self-pay | Admitting: Internal Medicine

## 2016-01-07 DIAGNOSIS — I4892 Unspecified atrial flutter: Secondary | ICD-10-CM

## 2016-01-07 DIAGNOSIS — Z01812 Encounter for preprocedural laboratory examination: Secondary | ICD-10-CM

## 2016-01-07 NOTE — Telephone Encounter (Signed)
I spoke with the patient in regards to scheduling his a-flutter ablation. I advised Dr. Caryl Comes would prefer to do this on 01/29/16- he would come in at 5:30 am for a 7:30 am procedure. The patient states that this should be fine, but he will check his calendar when he gets come and call me back to confirm.

## 2016-01-07 NOTE — Telephone Encounter (Signed)
Call back from the patient- he would prefer to go ahead and have his ablation on 12/22. He will come in on 12/20 for his labs/ EKG. Letter of instructions will be mailed to the patient.

## 2016-01-11 ENCOUNTER — Other Ambulatory Visit: Payer: Self-pay | Admitting: Family Medicine

## 2016-01-11 NOTE — Telephone Encounter (Signed)
Pt called and stated that he needs prior auth for tadalafil (CIALIS) 10 MG tablet. Please advise, thank you!  Call pt @ [phone]

## 2016-01-12 NOTE — Telephone Encounter (Signed)
Patient requested a PA on Cialis, we have not received one from the pharmacy, it looks like his is due for a refill on the cialis, so I am going to refill if okay and then see if a PA is needed.  Please advise.

## 2016-01-12 NOTE — Telephone Encounter (Signed)
I would hold off on refilling this until he has completed his cardiac evaluation for atrial flutter. Please inform the patient of this.

## 2016-01-13 NOTE — Telephone Encounter (Signed)
Patient has the ablation scheduled on the 22nd, they already got his Heart rhythm back to sinus at this point and took away all the meds with the exception of a blood thinner.  He will be taken off that on the 20th for labs.  He was told by cardiology that he was okay to take the cialis.  Please advise. thanks

## 2016-01-14 ENCOUNTER — Ambulatory Visit: Payer: Medicare Other | Admitting: Internal Medicine

## 2016-01-14 ENCOUNTER — Telehealth: Payer: Self-pay | Admitting: Cardiovascular Disease

## 2016-01-14 NOTE — Telephone Encounter (Signed)
Noted. I'll send a message to his cardiologist to confirm this. Thanks.

## 2016-01-14 NOTE — Telephone Encounter (Signed)
Patient was calling and wanting to get sleep study results. Let him know that I did not see them in the system but that I would reach out to see if I can get those results and have someone give him a call. He was appreciative for the call and had no further questions.

## 2016-01-14 NOTE — Telephone Encounter (Signed)
Pt has sleep study on 01/08/16, pt is requesting these results. I informed pt that it could a take a week or more to get these results, and once we have received the results we will contact him. Pt voiced understanding and had no further questions.  DR please advise. Thanks.

## 2016-01-14 NOTE — Telephone Encounter (Signed)
Pt states DR. Arida ordered a sleep study and would like results. Please call

## 2016-01-19 ENCOUNTER — Telehealth: Payer: Self-pay

## 2016-01-19 DIAGNOSIS — G4733 Obstructive sleep apnea (adult) (pediatric): Secondary | ICD-10-CM | POA: Diagnosis not present

## 2016-01-19 NOTE — Telephone Encounter (Signed)
-----   Message from Laverle Hobby, MD sent at 01/19/2016 11:00 AM EST ----- Regarding: sleep study Sleep study positive for OSA with AHI of 60. Needs in lab sleep study if covered, otherwise auto PAP pressure 5-20

## 2016-01-19 NOTE — Telephone Encounter (Signed)
Titration ordered. Pt aware and voiced understanding. Nothing further needed.

## 2016-01-20 ENCOUNTER — Other Ambulatory Visit: Payer: Self-pay | Admitting: Internal Medicine

## 2016-01-20 DIAGNOSIS — I483 Typical atrial flutter: Secondary | ICD-10-CM

## 2016-01-20 MED ORDER — TADALAFIL 10 MG PO TABS: 5.0000 mg | ORAL_TABLET | Freq: Every day | ORAL | 0 refills | Status: DC | PRN

## 2016-01-20 NOTE — Telephone Encounter (Signed)
-----   Message from Wellington Hampshire, MD sent at 01/18/2016  5:54 PM EST ----- Regarding: RE: question about cialis That should be fine from our standpoint. Thanks.   MA  ----- Message ----- From: [name], MD Sent: 01/14/2016   6:03 PM To: Wellington Hampshire, MD Subject: question about cialis                          Hi Dr Fletcher Anon,   I wanted to see if you could help with a question regarding [name]. He recently underwent cardioversion for a-flutter and has an ablation scheduled for a-flutter later this month. He is requesting a refill on his cialis and I wanted to check with you to make sure this would be okay given his recent cardiac issues. Thanks for your help.   [name]

## 2016-01-20 NOTE — Telephone Encounter (Signed)
Please let the patient know that his cardiologist is okay with the Cialis. This was sent to his pharmacy. Thanks.

## 2016-01-21 MED ORDER — TADALAFIL 10 MG PO TABS: 5.0000 mg | ORAL_TABLET | Freq: Every day | ORAL | 0 refills | Status: DC | PRN

## 2016-01-21 NOTE — Telephone Encounter (Signed)
Patient walked in the office today and he is requesting to have a refill with #30 pills. Please advise, he states  That Dr. Gilford Rile would prescribe this in the past and he cut them in half due to cost. (8 pills was 60dollars and so is 30 pills)

## 2016-01-21 NOTE — Telephone Encounter (Signed)
New prescription sent to pharmacy 

## 2016-01-25 ENCOUNTER — Telehealth: Payer: Self-pay | Admitting: *Deleted

## 2016-01-25 NOTE — Telephone Encounter (Signed)
Patient will need a prior authorization  for Cialis Pharmacy [facility]

## 2016-01-25 NOTE — Telephone Encounter (Signed)
PA completed on Covermymeds.

## 2016-01-27 ENCOUNTER — Other Ambulatory Visit (INDEPENDENT_AMBULATORY_CARE_PROVIDER_SITE_OTHER): Payer: Medicare Other

## 2016-01-27 ENCOUNTER — Ambulatory Visit (INDEPENDENT_AMBULATORY_CARE_PROVIDER_SITE_OTHER): Payer: Medicare Other | Admitting: *Deleted

## 2016-01-27 VITALS — HR 51 | Resp 22 | Ht 70.0 in | Wt 195.5 lb

## 2016-01-27 DIAGNOSIS — Z01812 Encounter for preprocedural laboratory examination: Secondary | ICD-10-CM | POA: Diagnosis not present

## 2016-01-27 DIAGNOSIS — Z01818 Encounter for other preprocedural examination: Secondary | ICD-10-CM | POA: Diagnosis not present

## 2016-01-27 DIAGNOSIS — I4892 Unspecified atrial flutter: Secondary | ICD-10-CM | POA: Diagnosis not present

## 2016-01-27 NOTE — Patient Instructions (Signed)
Hold Eliquis for procedure on Friday 01/29/16  Please call if you have any questions. [phone]  It was a pleasure seeing you today here in the office. Please do not hesitate to give [place] a call back if you have any further questions. [place], BSN

## 2016-01-28 LAB — CBC WITH DIFFERENTIAL/PLATELET
Basophils Absolute: 0 10*3/uL (ref 0.0–0.2)
Basos: 1 %
EOS (ABSOLUTE): 0.1 10*3/uL (ref 0.0–0.4)
Eos: 3 %
Hematocrit: 43.9 % (ref 37.5–51.0)
Hemoglobin: 15.2 g/dL (ref 13.0–17.7)
Immature Grans (Abs): 0 10*3/uL (ref 0.0–0.1)
Immature Granulocytes: 0 %
Lymphocytes Absolute: 1.8 10*3/uL (ref 0.7–3.1)
Lymphs: 39 %
MCH: 30.5 pg (ref 26.6–33.0)
MCHC: 34.6 g/dL (ref 31.5–35.7)
MCV: 88 fL (ref 79–97)
Monocytes Absolute: 0.5 10*3/uL (ref 0.1–0.9)
Monocytes: 10 %
Neutrophils Absolute: 2.1 10*3/uL (ref 1.4–7.0)
Neutrophils: 47 %
Platelets: 215 10*3/uL (ref 150–379)
RBC: 4.98 x10E6/uL (ref 4.14–5.80)
RDW: 13.8 % (ref 12.3–15.4)
WBC: 4.5 10*3/uL (ref 3.4–10.8)

## 2016-01-28 LAB — BASIC METABOLIC PANEL
BUN/Creatinine Ratio: 15 (ref 9–20)
BUN: 14 mg/dL (ref 6–24)
CO2: 28 mmol/L (ref 18–29)
Calcium: 9.4 mg/dL (ref 8.7–10.2)
Chloride: 104 mmol/L (ref 96–106)
Creatinine, Ser: 0.93 mg/dL (ref 0.76–1.27)
GFR calc Af Amer: 104 mL/min/{1.73_m2} (ref >59)
GFR calc non Af Amer: 90 mL/min/{1.73_m2} (ref >59)
Glucose: 79 mg/dL (ref 65–99)
Potassium: 4.5 mmol/L (ref 3.5–5.2)
Sodium: 143 mmol/L (ref 134–144)

## 2016-01-28 NOTE — Telephone Encounter (Signed)
PA denied, thanks

## 2016-01-29 ENCOUNTER — Encounter (HOSPITAL_COMMUNITY): Admission: RE | Payer: Self-pay | Source: Ambulatory Visit

## 2016-01-29 ENCOUNTER — Ambulatory Visit (HOSPITAL_COMMUNITY): Admission: RE | Admit: 2016-01-29 | Payer: Medicare Other | Source: Ambulatory Visit | Admitting: Internal Medicine

## 2016-01-29 SURGERY — A-FLUTTER ABLATION
Anesthesia: General

## 2016-02-02 ENCOUNTER — Encounter: Payer: Self-pay | Admitting: *Deleted

## 2016-02-02 ENCOUNTER — Other Ambulatory Visit: Payer: Self-pay | Admitting: *Deleted

## 2016-02-02 DIAGNOSIS — I4892 Unspecified atrial flutter: Secondary | ICD-10-CM

## 2016-02-02 NOTE — Telephone Encounter (Signed)
Pt came in and was asking more about why the PA was denied. Please advise, thank you!  Call pt @ [phone]

## 2016-02-02 NOTE — Telephone Encounter (Signed)
PA states that requested medication is not covered with this plan contact member services for further assistance.    Can you please inform patient, thanks

## 2016-02-02 NOTE — Telephone Encounter (Signed)
Patient advised of below . He will contact his member services department for further assistance.

## 2016-02-04 NOTE — Telephone Encounter (Signed)
Pt called back with some information for you. Pt called [facility] and stated that they have not received your fax from yesterday. There fax number is [phone], and if you wanted to speak with directly the number is [phone].  Pt would like you to call him. [phone]

## 2016-02-04 NOTE — Telephone Encounter (Signed)
Spoke with the [facility] PA dept. Re submitted over the phone for the medication and then notified the patient that we are under a 24 hour review with the pharmacist.  I will let him know what the outcome is as soon as possible. thanks

## 2016-02-10 ENCOUNTER — Telehealth: Payer: Self-pay | Admitting: Family Medicine

## 2016-02-10 NOTE — Telephone Encounter (Signed)
I have not

## 2016-02-10 NOTE — Telephone Encounter (Signed)
[name], have we received anything for this patient via fax for a PA?

## 2016-02-10 NOTE — Telephone Encounter (Signed)
Pt called and asked to speak with you in regards to a medication you were trying to get approved for him. Wouldn't give me any details, just asked for you to call him.   Call pt @ [phone]

## 2016-02-11 ENCOUNTER — Other Ambulatory Visit: Payer: Self-pay

## 2016-02-11 MED ORDER — TADALAFIL 10 MG PO TABS: 5.0000 mg | ORAL_TABLET | Freq: Every day | ORAL | 0 refills | Status: DC | PRN

## 2016-02-11 NOTE — Telephone Encounter (Signed)
Patient stated that his insurance has approved his Cialis, however someone from this office will need to call to let them know that he will need 30 tablets 10 mg monthly.  [facility] [phone]

## 2016-02-11 NOTE — Telephone Encounter (Signed)
Script Cialis 10 mg sent into [facility].    Per patients request. Patient advised.

## 2016-02-15 ENCOUNTER — Other Ambulatory Visit (INDEPENDENT_AMBULATORY_CARE_PROVIDER_SITE_OTHER): Payer: Medicare Other

## 2016-02-15 ENCOUNTER — Ambulatory Visit: Payer: Medicare Other | Admitting: Physical Therapy

## 2016-02-15 ENCOUNTER — Ambulatory Visit (INDEPENDENT_AMBULATORY_CARE_PROVIDER_SITE_OTHER): Payer: Medicare Other | Admitting: *Deleted

## 2016-02-15 VITALS — Ht 70.0 in | Wt 200.5 lb

## 2016-02-15 DIAGNOSIS — I4892 Unspecified atrial flutter: Secondary | ICD-10-CM | POA: Diagnosis not present

## 2016-02-15 NOTE — Patient Instructions (Signed)
1.) Reason for visit: Preop EKG for A flutter ablation for 02/17/16.  2.) Name of MD requesting visit: Dr Caryl Comes  3.) H&P: hx A. flutter  4.) ROS related to problem: Patient denies chest pain, palpitations, dizziness, SOB. Patient had EKG for preop Ablation.  5.) Assessment and plan per MD: EKG shown to Dr Rockey Situ. EKG showed Sinus Bradycardia. Patient instructed to stop Eliquis from today and until instructed to continue after the procedure. Patient verbalized understanding.

## 2016-02-16 ENCOUNTER — Telehealth: Payer: Self-pay | Admitting: Internal Medicine

## 2016-02-16 ENCOUNTER — Encounter: Payer: Self-pay | Admitting: *Deleted

## 2016-02-16 LAB — CBC WITH DIFFERENTIAL/PLATELET
Basophils Absolute: 0 10*3/uL (ref 0.0–0.2)
Basos: 0 %
EOS (ABSOLUTE): 0.1 10*3/uL (ref 0.0–0.4)
Eos: 2 %
Hematocrit: 45.1 % (ref 37.5–51.0)
Hemoglobin: 15.4 g/dL (ref 13.0–17.7)
Immature Grans (Abs): 0 10*3/uL (ref 0.0–0.1)
Immature Granulocytes: 0 %
Lymphocytes Absolute: 1.7 10*3/uL (ref 0.7–3.1)
Lymphs: 35 %
MCH: 30.6 pg (ref 26.6–33.0)
MCHC: 34.1 g/dL (ref 31.5–35.7)
MCV: 90 fL (ref 79–97)
Monocytes Absolute: 0.4 10*3/uL (ref 0.1–0.9)
Monocytes: 8 %
Neutrophils Absolute: 2.7 10*3/uL (ref 1.4–7.0)
Neutrophils: 55 %
Platelets: 213 10*3/uL (ref 150–379)
RBC: 5.04 x10E6/uL (ref 4.14–5.80)
RDW: 13.8 % (ref 12.3–15.4)
WBC: 4.9 10*3/uL (ref 3.4–10.8)

## 2016-02-16 LAB — BASIC METABOLIC PANEL
BUN/Creatinine Ratio: 13 (ref 9–20)
BUN: 15 mg/dL (ref 6–24)
CO2: 25 mmol/L (ref 18–29)
Calcium: 9.3 mg/dL (ref 8.7–10.2)
Chloride: 102 mmol/L (ref 96–106)
Creatinine, Ser: 1.12 mg/dL (ref 0.76–1.27)
GFR calc Af Amer: 83 mL/min/{1.73_m2} (ref >59)
GFR calc non Af Amer: 72 mL/min/{1.73_m2} (ref >59)
Glucose: 92 mg/dL (ref 65–99)
Potassium: 4.4 mmol/L (ref 3.5–5.2)
Sodium: 144 mmol/L (ref 134–144)

## 2016-02-16 NOTE — Anesthesia Preprocedure Evaluation (Addendum)
Anesthesia Evaluation  Patient identified by MRN, date of birth, ID band Patient awake    Reviewed: Allergy & Precautions, NPO status , Patient's Chart, lab work & pertinent test results  History of Anesthesia Complications Negative for: history of anesthetic complications  Airway Mallampati: II  TM Distance: >3 FB Neck ROM: Full    Dental  (+) Teeth Intact, Dental Advisory Given   Pulmonary neg sleep apnea, neg COPD, former smoker,    breath sounds clear to auscultation- rhonchi (-) wheezing      Cardiovascular Exercise Tolerance: Good hypertension, (-) CAD and (-) Past MI + dysrhythmias Atrial Fibrillation  Rhythm:Regular Rate:Normal - Systolic murmurs and - Diastolic murmurs    Neuro/Psych PSYCHIATRIC DISORDERS Anxiety negative neurological ROS     GI/Hepatic Neg liver ROS, hiatal hernia, GERD  ,  Endo/Other  negative endocrine ROSneg diabetes  Renal/GU negative Renal ROS     Musculoskeletal negative musculoskeletal ROS (+)   Abdominal (+) - obese,   Peds  Hematology negative hematology ROS (+)   Anesthesia Other Findings Past Medical History: 11/19/2013: BPH (benign prostatic hyperplasia) No date: Bulge of lumbar disc without myelopathy     Comment: L2/3 through L5/6 No date: Bulging disc     Comment: C2/3, C3/4, C6/7 No date: Cervical disc herniation     Comment: C4/5 and C5/6 04/18/2011: Colon polyp 08/31/2012: Constipation No date: Diverticulosis 01/04/2012: ED (erectile dysfunction) of organic origin No date: GERD (gastroesophageal reflux disease) No date: H/O diverticulitis of colon No date: Hemorrhoid No date: Hiatal hernia No date: Hyperlipidemia No date: Levoscoliosis No date: Spinal stenosis in cervical region     Comment: cord abutment C4/5 No date: Testicular pain, left 11/2015: Typical atrial flutter ([facility]CC)     Comment: a. CHADS2VASc => 0; b. on Eliquis for pending               ablation     Reproductive/Obstetrics                            Anesthesia Physical  Anesthesia Plan  ASA: II  Anesthesia Plan: General   Post-op Pain Management:    Induction: Intravenous  Airway Management Planned: LMA  Additional Equipment:   Intra-op Plan:   Post-operative Plan: Extubation in OR  Informed Consent: I have reviewed the patients History and Physical, chart, labs and discussed the procedure including the risks, benefits and alternatives for the proposed anesthesia with the patient or authorized representative who has indicated his/her understanding and acceptance.     Plan Discussed with: CRNA  Anesthesia Plan Comments:        Anesthesia Quick Evaluation

## 2016-02-16 NOTE — Telephone Encounter (Signed)
I called and spoke with the patient- he is aware of his lab results and his questions have been answered regarding his procedure.

## 2016-02-16 NOTE — Telephone Encounter (Signed)
Pt calling having some questions on the ablation he is having tomorrow  Please call

## 2016-02-17 ENCOUNTER — Ambulatory Visit (HOSPITAL_COMMUNITY)
Admission: RE | Admit: 2016-02-17 | Discharge: 2016-02-17 | Disposition: A | Discharge status: Home / Self Care | Payer: Medicare Other | Source: Ambulatory Visit | Attending: Internal Medicine | Admitting: Internal Medicine

## 2016-02-17 ENCOUNTER — Ambulatory Visit (HOSPITAL_COMMUNITY): Payer: Medicare Other | Admitting: Anesthesiology

## 2016-02-17 ENCOUNTER — Encounter (HOSPITAL_COMMUNITY): Payer: Self-pay | Admitting: Anesthesiology

## 2016-02-17 ENCOUNTER — Encounter (HOSPITAL_COMMUNITY)
Admission: RE | Disposition: A | Discharge status: Home / Self Care | Payer: Self-pay | Source: Ambulatory Visit | Attending: Internal Medicine

## 2016-02-17 DIAGNOSIS — F419 Anxiety disorder, unspecified: Secondary | ICD-10-CM | POA: Insufficient documentation

## 2016-02-17 DIAGNOSIS — G8929 Other chronic pain: Secondary | ICD-10-CM | POA: Diagnosis not present

## 2016-02-17 DIAGNOSIS — G4730 Sleep apnea, unspecified: Secondary | ICD-10-CM | POA: Diagnosis not present

## 2016-02-17 DIAGNOSIS — Z86010 Personal history of colonic polyps: Secondary | ICD-10-CM | POA: Diagnosis not present

## 2016-02-17 DIAGNOSIS — E785 Hyperlipidemia, unspecified: Secondary | ICD-10-CM | POA: Insufficient documentation

## 2016-02-17 DIAGNOSIS — M4802 Spinal stenosis, cervical region: Secondary | ICD-10-CM | POA: Diagnosis not present

## 2016-02-17 DIAGNOSIS — K449 Diaphragmatic hernia without obstruction or gangrene: Secondary | ICD-10-CM | POA: Insufficient documentation

## 2016-02-17 DIAGNOSIS — N400 Enlarged prostate without lower urinary tract symptoms: Secondary | ICD-10-CM | POA: Insufficient documentation

## 2016-02-17 DIAGNOSIS — I483 Typical atrial flutter: Secondary | ICD-10-CM | POA: Diagnosis not present

## 2016-02-17 DIAGNOSIS — K219 Gastro-esophageal reflux disease without esophagitis: Secondary | ICD-10-CM | POA: Diagnosis not present

## 2016-02-17 DIAGNOSIS — Z8679 Personal history of other diseases of the circulatory system: Secondary | ICD-10-CM | POA: Diagnosis present

## 2016-02-17 DIAGNOSIS — M5020 Other cervical disc displacement, unspecified cervical region: Secondary | ICD-10-CM | POA: Insufficient documentation

## 2016-02-17 HISTORY — PX: ATRIAL FLUTTER ABLATION: SHX5733

## 2016-02-17 HISTORY — PX: ELECTROPHYSIOLOGIC STUDY: SHX172A

## 2016-02-17 SURGERY — A-FLUTTER ABLATION
Anesthesia: General

## 2016-02-17 MED ORDER — FINASTERIDE 5 MG PO TABS: 5.0000 mg | ORAL_TABLET | Freq: Every day | ORAL | Status: DC

## 2016-02-17 MED ORDER — SODIUM CHLORIDE 0.9% FLUSH: 3.0000 mL | INTRAVENOUS | Status: DC | PRN

## 2016-02-17 MED ORDER — SODIUM CHLORIDE 0.9 % IV SOLN: 250.0000 mL | INTRAVENOUS | Status: DC | PRN

## 2016-02-17 MED ORDER — PROMETHAZINE HCL 25 MG/ML IJ SOLN: 6.2500 mg | INTRAMUSCULAR | Status: DC | PRN

## 2016-02-17 MED ORDER — PROPOFOL 10 MG/ML IV BOLUS
INTRAVENOUS | Status: DC | PRN
  Administered 2016-02-17: 200 mg via INTRAVENOUS

## 2016-02-17 MED ORDER — ACETAMINOPHEN 325 MG PO TABS: 650.0000 mg | ORAL_TABLET | ORAL | Status: DC | PRN

## 2016-02-17 MED ORDER — PANTOPRAZOLE SODIUM 40 MG PO TBEC: 40.0000 mg | DELAYED_RELEASE_TABLET | Freq: Every day | ORAL | Status: DC

## 2016-02-17 MED ORDER — MIDAZOLAM HCL 5 MG/5ML IJ SOLN
INTRAMUSCULAR | Status: DC | PRN
  Administered 2016-02-17: 2 mg via INTRAVENOUS

## 2016-02-17 MED ORDER — BUPIVACAINE HCL (PF) 0.25 % IJ SOLN
INTRAMUSCULAR | Status: AC
  Filled 2016-02-17: qty 60

## 2016-02-17 MED ORDER — LIDOCAINE HCL (CARDIAC) 20 MG/ML IV SOLN
INTRAVENOUS | Status: DC | PRN
  Administered 2016-02-17: 50 mg via INTRAVENOUS

## 2016-02-17 MED ORDER — SODIUM CHLORIDE 0.9 % IV SOLN: INTRAVENOUS | Status: DC

## 2016-02-17 MED ORDER — SODIUM CHLORIDE 0.9% FLUSH: 3.0000 mL | Freq: Two times a day (BID) | INTRAVENOUS | Status: DC

## 2016-02-17 MED ORDER — BUPIVACAINE HCL (PF) 0.25 % IJ SOLN
INTRAMUSCULAR | Status: DC | PRN
  Administered 2016-02-17: 45 mL

## 2016-02-17 MED ORDER — LACTATED RINGERS IV SOLN
INTRAVENOUS | Status: DC | PRN
  Administered 2016-02-17: 10:00:00 via INTRAVENOUS

## 2016-02-17 MED ORDER — HEPARIN (PORCINE) IN NACL 2-0.9 UNIT/ML-% IJ SOLN
INTRAMUSCULAR | Status: AC
Start: 2016-02-17 — End: 2016-02-17
  Filled 2016-02-17: qty 500

## 2016-02-17 MED ORDER — ONDANSETRON HCL 4 MG/2ML IJ SOLN: 4.0000 mg | Freq: Four times a day (QID) | INTRAMUSCULAR | Status: DC | PRN

## 2016-02-17 MED ORDER — SODIUM CHLORIDE 0.9 % IV SOLN
INTRAVENOUS | Status: DC
Start: 2016-02-17 — End: 2016-02-17
  Administered 2016-02-17 (×2): via INTRAVENOUS

## 2016-02-17 MED ORDER — MIRABEGRON ER 50 MG PO TB24: 50.0000 mg | ORAL_TABLET | Freq: Every day | ORAL | Status: DC

## 2016-02-17 MED ORDER — FENTANYL CITRATE (PF) 100 MCG/2ML IJ SOLN
INTRAMUSCULAR | Status: DC | PRN
  Administered 2016-02-17: 100 ug via INTRAVENOUS

## 2016-02-17 MED ORDER — HYDROMORPHONE HCL 1 MG/ML IJ SOLN: 0.2500 mg | INTRAMUSCULAR | Status: DC | PRN

## 2016-02-17 MED ORDER — SODIUM CHLORIDE 0.9 % IV SOLN: INTRAVENOUS | Status: AC

## 2016-02-17 MED ORDER — ATORVASTATIN CALCIUM 10 MG PO TABS: 10.0000 mg | ORAL_TABLET | ORAL | Status: DC

## 2016-02-17 MED ORDER — HEPARIN (PORCINE) IN NACL 2-0.9 UNIT/ML-% IJ SOLN
INTRAMUSCULAR | Status: DC | PRN
  Administered 2016-02-17: 500 mL

## 2016-02-17 MED ORDER — CYCLOBENZAPRINE HCL 10 MG PO TABS: 10.0000 mg | ORAL_TABLET | Freq: Three times a day (TID) | ORAL | Status: DC | PRN

## 2016-02-17 MED ORDER — MELOXICAM 15 MG PO TABS: 15.0000 mg | ORAL_TABLET | Freq: Every day | ORAL | Status: DC | PRN

## 2016-02-17 SURGICAL SUPPLY — 13 items
BAG SNAP BAND KOVER 36X36 (MISCELLANEOUS) ×2 IMPLANT
CATH BLAZERPRIME XP LG CV 10MM (ABLATOR) ×2 IMPLANT
CATH DUODECA HALO/ISMUS 7FR (CATHETERS) ×2 IMPLANT
CATH OCTAPOLOR 6F 125CM 2-5-2 (CATHETERS) ×2 IMPLANT
CATH QUAD COURNAND 5FR (CATHETERS) ×2 IMPLANT
PACK EP LATEX FREE (CUSTOM PROCEDURE TRAY) ×1
PACK EP LF (CUSTOM PROCEDURE TRAY) ×1 IMPLANT
PAD DEFIB LIFELINK (PAD) ×2 IMPLANT
SHEATH ATRIAL FLUTTER SAFL 8F (SHEATH) ×2 IMPLANT
SHEATH PINNACLE 6F 10CM (SHEATH) ×2 IMPLANT
SHEATH PINNACLE 7F 10CM (SHEATH) ×2 IMPLANT
SHEATH PINNACLE 8F 10CM (SHEATH) ×4 IMPLANT
SHIELD RADPAD SCOOP 12X17 (MISCELLANEOUS) ×2 IMPLANT

## 2016-02-17 NOTE — Discharge Instructions (Signed)
Angiogram, Care After These instructions give you information about caring for yourself after your procedure. Your doctor may also give you more specific instructions. Call your doctor if you have any problems or questions after your procedure. Follow these instructions at home:  Take medicines only as told by your doctor.  Follow your doctor's instructions about:  Care of the area where the tube was inserted.  Bandage (dressing) changes and removal.  You may shower 24-48 hours after the procedure or as told by your doctor.  Do not take baths, swim, or use a hot tub until your doctor approves.  Every day, check the area where the tube was inserted. Watch for:  Redness, swelling, or pain.  Fluid, blood, or pus.  Do not apply powder or lotion to the site.  Do not lift anything that is heavier than 10 lb (4.5 kg) for 5 days or as told by your doctor.  Ask your doctor when you can:  Return to work or school.  Do physical activities or play sports.  Have sex.  Do not drive or operate heavy machinery for 24 hours or as told by your doctor.  Have someone with you for the first 24 hours after the procedure.  Keep all follow-up visits as told by your doctor. This is important. Contact a health care provider if:  You have a fever.  You have chills.  You have more bleeding from the area where the tube was inserted. Hold pressure on the area.  You have redness, swelling, or pain in the area where the tube was inserted.  You have fluid or pus coming from the area. Get help right away if:  You have a lot of pain in the area where the tube was inserted.  The area where the tube was inserted is bleeding, and the bleeding does not stop after 30 minutes of holding steady pressure on the area.  The area near or just beyond the insertion site becomes pale, cool, tingly, or numb. This information is not intended to replace advice given to you by your health care provider. Make  sure you discuss any questions you have with your health care provider. Document Released: 04/22/2008 Document Revised: 07/02/2015 Document Reviewed: 06/27/2012 Elsevier Interactive Patient Education  2017 [place].   Cardiac Ablation Cardiac ablation is a procedure to stop some heart tissue from causing problems. The heart has many electrical connections. Sometimes these connections cause the heart to beat very fast or irregularly. Removing some of the problem areas can improve heart rhythm or make it normal. Ablation is done for people who:  Have Wolff-Parkinson-White syndrome.  Have other fast heart rhythms (tachycardia).  Have taken medicines for an abnormal heart rhythm (arrhythmia) and the medicines had:  No success.  Side effects.  May have a type of heartbeat that could cause death. What happens before the procedure?  Follow instructions from your doctor about eating and drinking before the procedure.  Take your medicines as told by your doctor. Take them at regular times with water unless told differently by your doctor.  If you are taking diabetes medicine, ask your doctor how to take it. Ask if there are any special instructions you should follow. Your doctor may change how much insulin you take the day of the procedure. What happens during the procedure?  A special type of X-ray will be used. The X-ray helps your doctor see images of your heart during the procedure.  A small cut (incision) will be  made in your neck or groin.  An IV tube will be started before the procedure begins.  You will be given a numbing medicine (anesthetic) or a medicine to help you relax (sedative).  The skin on your neck or groin will be numbed.  A needle will be put into a large vein in your neck or groin.  A thin, flexible tube (catheter) will be put in to reach your heart.  A dye will be put in the tube. The dye will show up on X-rays. It will help your doctor see the area of the  heart that needs treatment.  When the heart tissue that is causing problems is found, the tip of the tube will send an electrical current to it. This will stop it from causing problems.  The tube will be taken out.  Pressure will be put on the area where the tube was. This will keep it from bleeding. A bandage will be placed over the area. What happens after the procedure?  You will be taken to a recovery area. Your blood pressure, heart rate, and breathing will be watched. The area where the tube was will also be watched for bleeding.  You will need to lie still for 4-6 hours. This keeps the area where the tube was from bleeding. This information is not intended to replace advice given to you by your health care provider. Make sure you discuss any questions you have with your health care provider. Document Released: 09/26/2012 Document Revised: 07/02/2015 Document Reviewed: 06/21/2012 Elsevier Interactive Patient Education  2017 [employer].

## 2016-02-17 NOTE — Anesthesia Postprocedure Evaluation (Addendum)
Anesthesia Post Note  Patient: [name].  Procedure(s) Performed: Procedure(s) (LRB): A-Flutter Ablation (N/A)  Patient location during evaluation: PACU Anesthesia Type: General Level of consciousness: awake and alert Pain management: pain level controlled Vital Signs Assessment: post-procedure vital signs reviewed and stable Respiratory status: spontaneous breathing, nonlabored ventilation, respiratory function stable and patient connected to nasal cannula oxygen Cardiovascular status: blood pressure returned to baseline and stable Postop Assessment: no signs of nausea or vomiting Anesthetic complications: no       Last Vitals:  Vitals:   02/17/16 1125 02/17/16 1130  BP: 140/78 (!) 144/87  Pulse: (!) 57 (!) 59  Resp: 17 14  Temp:      Last Pain:  Vitals:   02/17/16 1035  TempSrc: Temporal                 Linzie Criss,JAMES TERRILL

## 2016-02-17 NOTE — Progress Notes (Signed)
Groins examined  No bruit no rub a little L sided tenderness  Ok for discharge

## 2016-02-17 NOTE — Progress Notes (Addendum)
Site area: RFV x 2 /LFV x2 Site Prior to Removal:  Level 0/0 Pressure Applied For:20 min left - 25 min right Manual:   yes Patient Status During Pull:  stable Post Pull Site:  Level 0 Post Pull Instructions Given:  yes Post Pull Pulses Present: palpable Dressing Applied: tegaderm  Bedrest begins @ [id number] till 1745 Comments:

## 2016-02-17 NOTE — H&P (Signed)
      Patient Care Team: [name], MD as PCP - General (Family Medicine) Robert Bellow, MD (General Surgery)   HPI  [name]. is a 61 y.o. male With hx of atrial flutter-- now for catheter ablation  He underwent DCCV and no intercurrent arrhythmia  Abnormal sleep study,  Results reviewed    Past Medical History:  Diagnosis Date  . BPH (benign prostatic hyperplasia) 11/19/2013  . 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 Bulging disc    C2/3, C3/4, C6/7  . Cervical disc herniation    C4/5 and C5/6  . Colon polyp 04/18/2011  . Constipation 08/31/2012  . Diverticulosis   . 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 GERD (gastroesophageal reflux disease)   . H/O diverticulitis of colon   . Hemorrhoid   . Hiatal hernia   . Hyperlipidemia   . Levoscoliosis   . Spinal stenosis in cervical region    cord abutment C4/5  . Testicular pain, left   . Typical atrial flutter ([place]) 11/2015   a. CHADS2VASc => 0; b. on Eliquis for pending ablation     Past Surgical History:  Procedure Laterality Date  . ELECTROPHYSIOLOGIC STUDY N/A 01/04/2016   Procedure: Cardioversion;  Surgeon: Wellington Hampshire, MD;  Location: [facility] ORS;  Service: Cardiovascular;  Laterality: N/A;  . GANGLION CYST EXCISION Right 1994   wrist & back  . HERNIA REPAIR Left 1994   abdominal repair with mesh  . NASAL SEPTUM SURGERY  2011   Dr. [name]     Current Facility-Administered Medications  Medication Dose Route Frequency Provider Last Rate Last Dose  . 0.9 %  sodium chloride infusion   Intravenous Continuous Deboraha Sprang, MD 50 mL/hr at 02/17/16 0715      Allergies  Allergen Reactions  . Other Other (See Comments)    Pollen:  Sinus infection, cough, fatigue    Scheduled Meds: Continuous Infusions: . sodium chloride 50 mL/hr at 02/17/16 0715   PRN Meds:.   Review of Systems negative except from HPI and PMH  Physical Exam BP (!)  141/76   Pulse 70   Temp 98.1 F (36.7 C) (Oral)   Resp 20   Ht 5\' 10"  (1.778 m)   Wt 195 lb (88.5 kg)   SpO2 100%   BMI 27.98 kg/m  Well developed and well nourished in no acute distress HENT normal E scleral and icterus clear Neck Supple JVP flat; carotids brisk and full Clear to ausculation  Regular rate and rhythm, no murmurs gallops or rub Soft with active bowel sounds No clubbing cyanosis  Edema Alert and oriented, grossly normal motor and sensory function Skin Warm and Dry  ECG 1/8 sinus rhythm  Assessment and  Plan  Atrial flutter--  typical   Sleep apnea  Chronic back and neck pain     Pt admitted for ablation of atrial flutter substrate.  Reviewed risks and benefits incl but not limited to death, bleeding cardiac perforation and heart block

## 2016-02-17 NOTE — Progress Notes (Signed)
Pt stable without bleeding or arrhythmias.  Verbalizes understanding of all discharge instructions.  Dr Caryl Comes came and assessed pt and spoke with him also about his medications and discharge instructions.  Pt able to correctly verbalize back to me.  Pt's wife also present and verbalizes understanding.  Pt discharged to home.

## 2016-02-17 NOTE — Transfer of Care (Signed)
Immediate Anesthesia Transfer of Care Note  Patient: [name].  Procedure(s) Performed: Procedure(s): A-Flutter Ablation (N/A)  Patient Location: PACU  Anesthesia Type:General  Level of Consciousness: awake, alert  and oriented  Airway & Oxygen Therapy: Patient Spontanous Breathing  Post-op Assessment: Report given to RN and Post -op Vital signs reviewed and stable  Post vital signs: Reviewed and stable  Last Vitals:  Vitals:   02/17/16 0649 02/17/16 0656  BP: (!) 141/76 (!) 141/76  Pulse: (!) 56 70  Resp: 20 20  Temp: 36.6 C 36.7 C    Last Pain:  Vitals:   02/17/16 0656  TempSrc: Oral         Complications: No apparent anesthesia complications

## 2016-02-17 NOTE — Anesthesia Procedure Notes (Signed)
Procedure Name: LMA Insertion Date/Time: 02/17/2016 9:01 AM Performed by: Eligha Bridegroom Pre-anesthesia Checklist: Patient identified, Emergency Drugs available, Suction available, Patient being monitored and Timeout performed Patient Re-evaluated:Patient Re-evaluated prior to inductionOxygen Delivery Method: Circle system utilized Preoxygenation: Pre-oxygenation with 100% oxygen Intubation Type: IV induction Ventilation: Mask ventilation without difficulty LMA: LMA inserted LMA Size: 5.0 Number of attempts: 2 Placement Confirmation: positive ETCO2 and breath sounds checked- equal and bilateral Tube secured with: Tape Dental Injury: Teeth and Oropharynx as per pre-operative assessment

## 2016-02-18 ENCOUNTER — Encounter (HOSPITAL_COMMUNITY): Payer: Self-pay | Admitting: Internal Medicine

## 2016-02-18 ENCOUNTER — Other Ambulatory Visit: Payer: Self-pay | Admitting: Family Medicine

## 2016-02-18 NOTE — Telephone Encounter (Signed)
Last filled 09/21/15 30 0rf Also would like refill on meloxicam  Patient states he only uses these as needed for his neck and back pain

## 2016-02-18 NOTE — Op Note (Signed)
NAMEARTEMIS, [name] NO.:  [id number]  MEDICAL RECORD NO.:  [id number]  LOCATION:  [facility]                         FACILITY:  [facility]  PHYSICIAN:  Deboraha Sprang, MD, FACCDATE OF BIRTH:  [birth year]  DATE OF PROCEDURE:  02/17/2016 DATE OF DISCHARGE:                              OPERATIVE REPORT   PREOPERATIVE DIAGNOSIS:  Atrial flutter.  POSTOPERATIVE DIAGNOSIS:  Atrial flutter.  PROCEDURE:  Invasive electrophysiological study, electrogram mapping, and RF catheter ablation.  PROCEDURE:  Following obtaining informed consent, the patient brought to electrophysiology laboratory and placed on the fluoroscopic table in a supine position.  After a routine prep and drape and having been admitted to general anesthesia under the care of Dr. Orene Desanctis, catheterization was performed with adjunct of local anesthesia. Following the procedure, the catheters were removed.  The patient was transferred to the holding area for sheath removal in stable condition.  CATHETERS: 1. A 5-French quadripolar catheter was inserted via left femoral vein     to the AV junction. 2. A 6-French octapolar catheter was inserted via the right femoral     vein to the coronary sinus. 3. A 7-French dual decapolar catheter was inserted via the left     femoral vein to the tricuspid anulus. 4. An 8-French 10-mm deflectable tip of catheter was inserted via the     right femoral vein using SAFL sheath to mapping sites in the     posterior septal space.  SURFACE LEADS:  1, aVF and V1 were monitored continuously throughout the procedure.  Following insertion of the catheters, a stimulation protocol included: 1. Incremental atrial pacing. 2. Incremental ventricular pacing. 3. Single and double atrial extrastimuli at paced cycle length of 600     milliseconds.  END-TIDAL RESULTS:  End-tidal basic intervals and intracardiac measurements. 1. Rhythm:  Sinus; RR interval 1061 millisecond; P-wave duration  126     milliseconds; PR interval 171 milliseconds; QRS duration 123     milliseconds; QT interval 403 milliseconds; AH interval 77     milliseconds; and HV interval 64 milliseconds.  Final: Rhythm:  Sinus; RR interval 910 milliseconds; P-wave duration 138 milliseconds; PR interval 185 milliseconds; QRS duration 119 milliseconds; QTc interval 419 milliseconds; AH interval 72 milliseconds; HV interval 64 milliseconds.  END-TIDAL AV NODAL FUNCTION: 1. AV Wenckebach was 350 milliseconds. 2. VA Wenckebach was 450 milliseconds. 3. Dual AV nodal physiology was evident with PR greater than RR     although a jump was not noted apart from distal. 4. ERP, no echoes were identified.  NORMAL HIS-PURKINJE SYSTEM FUNCTION:  Modest prolongation of the HV interval.  ACCESSORY PATHWAY FUNCTION:  No evidence of any accessory pathway was identified.  Ventricular response to programmed stimulation was normal as described above.  RADIOFREQUENCY ENERGY:  A total of 5.4 minutes of RF was delivered across the cavotricuspid isthmus resulted in bidirectional isthmus conduction block.  FLUOROSCOPY TIME:  A total of 5.3 minutes of fluoroscopy time was utilized.  IMPRESSION: 1. Normal sinus function. 2. Abnormal atrial function manifested by previously demonstrable     typical atrial flutter.  Successful cavotricuspid isthmus     conduction block was accomplished  with RF energy. 3. Abnormal AV nodal function with PR greater than RR with evidence of     dual antegrade AV nodal physiology without inducible echo beats;     without a history of SVT, no isoproterenol was utilized to try to     provoke AV nodal reentry. 4. Modest prolongation of the HV interval. 5. No accessory pathway. 6. Normal ventricular response to programmed stimulation.  CONCLUSION:  Results of electrophysiological testing confirmed cavotricuspid isthmus conduction with elimination of conduction across the cavotricuspid isthmus  with RF energy eliminating the substrate for the patient's typical atrial flutter.  The patient has modest prolongation of the HV interval in the context of this QRS widening.  Also has dual AV nodal physiology with a present slow pathway.  No tachycardia history is known.  The patient will be watched this afternoon and anticipate discharge home later today without ongoing anticoagulation.     Deboraha Sprang, MD, [facility]     SCK/MEDQ  D:  02/17/2016  T:  02/18/2016  Job:  [id number]

## 2016-02-19 ENCOUNTER — Ambulatory Visit: Payer: Medicare Other | Admitting: Cardiovascular Disease

## 2016-02-19 ENCOUNTER — Telehealth: Payer: Self-pay | Admitting: Family Medicine

## 2016-02-19 MED ORDER — MELOXICAM 15 MG PO TABS
15.0000 mg | ORAL_TABLET | Freq: Every day | ORAL | 1 refills | Status: DC | PRN
Start: 2016-02-19 — End: 2016-08-16

## 2016-02-19 NOTE — Telephone Encounter (Signed)
PA was started on covermymeds today for patient.

## 2016-02-19 NOTE — Telephone Encounter (Signed)
Sent to pharmacy 

## 2016-02-22 ENCOUNTER — Encounter: Payer: Medicare Other | Admitting: Physical Therapy

## 2016-02-22 ENCOUNTER — Other Ambulatory Visit: Payer: Self-pay

## 2016-02-22 MED ORDER — ATORVASTATIN CALCIUM 10 MG PO TABS: 10.0000 mg | ORAL_TABLET | ORAL | 0 refills | Status: DC

## 2016-02-23 NOTE — Telephone Encounter (Signed)
What medication is this in regards to? Thanks.

## 2016-02-23 NOTE — Telephone Encounter (Signed)
I apologize Cialis.

## 2016-02-23 NOTE — Telephone Encounter (Signed)
PA was denied by [employer] due to patient does not meet their criteria for medication they will only cover 6 pills for 30 day script. Would you want try another medication or I can start an appeal.

## 2016-02-24 NOTE — Telephone Encounter (Signed)
Please see if patient would be fine with me sending in 6 tablets for a 30 day prescription. Thanks.

## 2016-02-25 ENCOUNTER — Ambulatory Visit: Payer: Medicare Other | Admitting: Internal Medicine

## 2016-02-29 ENCOUNTER — Ambulatory Visit: Payer: Medicare Other | Admitting: Physical Therapy

## 2016-03-04 ENCOUNTER — Telehealth: Payer: Self-pay | Admitting: Family Medicine

## 2016-03-04 NOTE — Telephone Encounter (Signed)
Noted  

## 2016-03-04 NOTE — Telephone Encounter (Signed)
[facility] tried to refill medication  To soon is why Cialis was denied patient has been approved for 21 pills monthly for one year. FYI

## 2016-03-04 NOTE — Telephone Encounter (Signed)
Pt called back and stated that he was approved for 21 pills for 340 days and [facility] tried to refill to soon and that is why you got that letter.   Call pt @ [phone]

## 2016-03-04 NOTE — Telephone Encounter (Signed)
Patient stated that insurance covered medication. For 21 pills and sent him letter that confirms coverage for one year. We received rejection due to pharmacy refilled medication to soon.

## 2016-03-07 ENCOUNTER — Ambulatory Visit: Payer: Medicare Other | Attending: Urology | Admitting: Physical Therapy

## 2016-03-07 ENCOUNTER — Encounter: Payer: Self-pay | Admitting: Physical Therapy

## 2016-03-07 DIAGNOSIS — R2689 Other abnormalities of gait and mobility: Secondary | ICD-10-CM | POA: Diagnosis not present

## 2016-03-07 DIAGNOSIS — M542 Cervicalgia: Secondary | ICD-10-CM | POA: Insufficient documentation

## 2016-03-07 DIAGNOSIS — R278 Other lack of coordination: Secondary | ICD-10-CM

## 2016-03-07 DIAGNOSIS — M5440 Lumbago with sciatica, unspecified side: Secondary | ICD-10-CM

## 2016-03-07 DIAGNOSIS — M4125 Other idiopathic scoliosis, thoracolumbar region: Secondary | ICD-10-CM | POA: Diagnosis not present

## 2016-03-07 NOTE — Patient Instructions (Addendum)
    Increase water by taking 16 fl oz water this week, next week increase to 32 fl oz or 2 tall glasses   Decrease from 2 sodas to one 1 soda this and next week.    Achieve a ratio between bladder irritants to water from 3:0  -->   1:1   --> gradually 1: 3    ______  Sleeping with bottom pillow lower at midback to decrease neck issues Top pillow lies above shoulder  Folded handtowel corner inunder R low back to fill in scoilosis gap  Pillow under knees      _____  Catch yourself with feet on the ground to slouch less   ______ Log rolling out bed

## 2016-03-07 NOTE — Progress Notes (Signed)
error 

## 2016-03-08 NOTE — Therapy (Signed)
[place] MAIN [facility] SERVICES [address] [place], [place], [address] Phone: [phone]   Fax:  [phone]  Physical Therapy Evaluation  Patient Details  Name: [name]. MRN: [id number] Date of Birth: 1955-10-03 Referring Provider: Zara Council  Encounter Date: 03/07/2016      PT End of Session - 03/08/16 2127    Visit Number 1   Number of Visits 12   Date for PT Re-Evaluation 05/24/16   Authorization Type g code   PT Start Time 0815   PT Stop Time 0920   PT Time Calculation (min) 65 min   Activity Tolerance Patient tolerated treatment well;No increased pain   Behavior During Therapy WFL for tasks assessed/performed      Past Medical History:  Diagnosis Date  . Anxiety   . BPH (benign prostatic hyperplasia) 11/19/2013  . 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 Bulging disc    C2/3, C3/4, C6/7  . Cervical disc herniation    C4/5 and C5/6  . Colon polyp 04/18/2011  . Constipation 08/31/2012  . Depression   . Diverticulosis   . 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 GERD (gastroesophageal reflux disease)   . H/O diverticulitis of colon   . Hemorrhoid   . Hiatal hernia   . Hyperlipidemia   . Levoscoliosis   . Sleep difficulties   . Spinal stenosis in cervical region    cord abutment C4/5  . Testicular pain, left   . Typical atrial flutter ([place]) 11/2015   a. CHADS2VASc => 0; b. on Eliquis for pending ablation     Past Surgical History:  Procedure Laterality Date  . ATRIAL FLUTTER ABLATION    . ELECTROPHYSIOLOGIC STUDY N/A 01/04/2016   Procedure: Cardioversion;  Surgeon: Wellington Hampshire, MD;  Location: [facility] ORS;  Service: Cardiovascular;  Laterality: N/A;  . ELECTROPHYSIOLOGIC STUDY N/A 02/17/2016   Procedure: A-Flutter Ablation;  Surgeon: Deboraha Sprang, MD;  Location: [place] CV LAB;  Service: Cardiovascular;  Laterality: N/A;  . GANGLION CYST EXCISION Right  1994   wrist & back  . HERNIA REPAIR Left 1994   abdominal repair with mesh  . NASAL SEPTUM SURGERY  2011   Dr. [name]     There were no vitals filed for this visit.       Subjective Assessment - 03/08/16 2121    Subjective 1) Pt reports he has had frequent urination for the past 20 years. Pt also reports he broke his tailbone in 1998 after falling off a stoop. Pt did not undergo surgery. The tailbone no longer bothers him. Pt noticed his urinary frequency occurred every hour following this injury. Over the recent years, pt noticed the frequency Sx became worse. Pt has been told he has an enlarged prostate. Pt goes to urinate 1x/ every hour during the day and nocturia episodes occur 4x/ night. Pt notices when he gets nervous, he goes more to urinate. Pt will be getting fitted for a CPAP for sleep apnea in the upcoming weeks.  Denied urinary leakage. Pt has difficulty initating and stopping urination at night. Medications prescribed by urologist have helped his Sx during day but not during the night.     2) LBP and sciatic pain have been an issue for over 20 years. Pt sustained multiple falls. Pt sustatined a fall on the ice in 2007 and showed herniated disc and mild scoliosis in 2007. Pt went onto  disability. Pt has received DC Tx and PT when he was in [place]. Currently, pain comes on after walking 1/2 block. Pain radiating BLE on the lateral edge to the feet L > R  " feels like electricity" that can last 5 min to 1 hr . Pain level 5-6/10.  Pain is primarily started in the low back, wraps around to the R hip and then radiates down the LLE more of the time.   It interrupts sleep sparodically.  Pt is not doing any physical activities due pain.     3) neck pain has occurred for the past ten years after his fall on the ice.  Pt states he has herniated discs. Pt has radiating pain down L arm to digits 3-5 palm side. Pt does not lift anything heavy due to pain.         Patient Stated Goals not to go to  the bathroom so much             [facility] PT Assessment - 03/08/16 2121      Assessment   Medical Diagnosis pelvic floor dysfunction   Referring Provider Zara Council     Precautions   Precautions None     Restrictions   Weight Bearing Restrictions No     Prior Function   Level of Independence Independent     Observation/Other Assessments   Observations slump posture, forward head    Other Surveys  --  NDI   , ODI       Coordination   Gross Motor Movements are Fluid and Coordinated --  diaphragmatic breathing achieved with cues   Fine Motor Movements are Fluid and Coordinated --  abdominal strain w/ cue BM     Single Leg Stance   Comments no deficits     Posture/Postural Control   Posture Comments lumbopelvic perturbation with leg movements      ROM / Strength   AROM / PROM / Strength --  limited R sidebend > L w/ back pain      Palpation   Spinal mobility L thoracolumbar scoliosis, L trunk rotation   SI assessment  plan to assess coccyx and SIJ more thoroughly at next session     Bed Mobility   Bed Mobility --  crunch method, increased strain on spine/pelvic floor          Neck and lumbar assessment to be performed at next session       Pelvic Floor Special Questions - 03/08/16 2122    Diastasis Recti to assess at next session          [facility] Adult PT Treatment/Exercise - 03/08/16 2121      Therapeutic Activites    Therapeutic Activities --  see pt instructions                PT Education - 03/08/16 2126    Education provided Yes   Education Details POC, anatomy, physiology, goals, HEP,   Person(s) Educated Patient   Methods Explanation;Demonstration;Tactile cues;Verbal cues;Handout   Comprehension Returned demonstration;Verbalized understanding             PT Long Term Goals - 03/08/16 2141      PT LONG TERM GOAL #1   Title Pt will decrease his score on PSQI from  %  <  % to indicate improved sleep and better  managed nocturia    Time 12   Period Weeks   Status New     PT LONG TERM GOAL #  2   Title Pt will demo decreased score on ZUNG anxiety score from  %   to <  % in order to imdicate IND with stress manage strategies to decrease frequent urination and sleep   Time 12   Period Weeks   Status New     PT LONG TERM GOAL #3   Title Pt will decrease his score on NDI from %  to  <  % to increase his ADLs   Time 12   Period Weeks   Status New     PT LONG TERM GOAL #4   Title Pt will show decreased ODI score from  %   to   <   % in order to perform ADLs   Time 12   Period Weeks   Status New     PT LONG TERM GOAL #5   Title pt will demo IND with proper body mechanics with lifting, out of bed t/f, and sitting without cuing in order to improv e QOL   Time 12   Period Weeks   Status New     Additional Long Term Goals   Additional Long Term Goals Yes     PT LONG TERM GOAL #6   Title pt will demo IND with scoliosis HEP in order to manage pain and return to fitness with decreased risk for relapse of Sx   Time 12   Period Weeks   Status New     PT LONG TERM GOAL #7   Title Pt will report centralization of radiating pain from his fingers to the cervical spine across 2 visits in order to increase his functional mobility   Time 12   Period Weeks   Status New     PT LONG TERM GOAL #8   Title Pt will report centralization of radiating pain from feet to his lumbar/glut region across 2 visits in order to walk 1-2 blocks.   Time 12   Period Weeks   Status New               Plan - 03/08/16 2128    Clinical Impression Statement Pt is a 61 yo male who complains of worsening of urinary Sx across the past 20 years. Sx started following a fall that caused an injury to his tailbone. These Sx include frequent urination, difficulty with initiating and stopping urination, and nocturia.   Pt also complains of neck and back pain 2/2 scoliosis, herniated disks, and previous falls.  Pt's clincial  presentation includes a L thoracolumbar spine, limited spinal mobility, dyscoordination of deep core mm, limited education on bladder health habits and limited stress-management strategies to manage his anxiety and poor sleep. Following Tx today, pt learned body mechanics OOB to not strain spine and pelvic floor. Pt also was educated on increasing water intake in ratio to bladder irritants and how to lay comfortably on his back. Anticipate pt 's nocturia may improve with use of CPAP which he will be fitted for the upcoming weeks.       Rehab Potential Good   PT Frequency 1x / week   PT Duration 12 weeks   PT Treatment/Interventions ADLs/Self Care Home Management;Electrical Stimulation;Moist Heat;Traction;Gait training;Stair training;Neuromuscular re-education;Patient/family education;Therapeutic activities;Balance training;Therapeutic exercise;Scar mobilization;Manual lymph drainage;Manual techniques;Energy conservation;Taping;Cryotherapy   Consulted and Agree with Plan of Care Patient      Patient will benefit from skilled therapeutic intervention in order to improve the following deficits and impairments:  Decreased activity tolerance, Decreased balance, Increased muscle  spasms, Postural dysfunction, Pain, Improper body mechanics, Hypomobility, Decreased range of motion, Decreased endurance, Difficulty walking, Decreased strength, Decreased coordination, Decreased mobility, Decreased safety awareness, Other (comment)  Visit Diagnosis: Other abnormalities of gait and mobility  Other idiopathic scoliosis, thoracolumbar region  Other lack of coordination  Cervicalgia  Low back pain with sciatica, sciatica laterality unspecified, unspecified back pain laterality, unspecified chronicity      G-Codes - 2016-04-04 06-04-21    Functional Assessment Tool Used Clinical judegment    Functional Limitation Mobility: Walking and moving around;Self care   Mobility: Walking and Moving Around Current Status  [phone]) At least 40 percent but less than 60 percent impaired, limited or restricted   Mobility: Walking and Moving Around Goal Status [id number]) At least 20 percent but less than 40 percent impaired, limited or restricted   Self Care Current Status [id number]) At least 20 percent but less than 40 percent impaired, limited or restricted   Self Care Goal Status [id number]) At least 1 percent but less than 20 percent impaired, limited or restricted       Problem List Patient Active Problem List   Diagnosis Date Noted  . Typical atrial flutter ([place])   . Elevated PSA 12/24/2015  . Encounter for anticoagulation discussion and counseling   . Dyspnea   . Chronic neck pain   . Adjustment disorder with mixed anxiety and depressed mood 07/23/2015  . OAB (overactive bladder) 03/31/2015  . Generalized anxiety disorder 12/26/2013  . BPH (benign prostatic hyperplasia) 11/19/2013  . Hyperlipidemia 11/19/2013  . Spinal stenosis in cervical region 03/08/2013  . Lipoma of back 03/08/2013  . Routine general medical examination at a health care facility 08/31/2012  . Benign prostatic hyperplasia with urinary obstruction 08/23/2012  . 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 Colon polyp 04/18/2011    Jerl Mina ,PT, DPT, E-RYT   03/08/2016, 9:52 PM  [place] MAIN [facility] SERVICES [address] [place], [place], [address] Phone: [phone]   Fax:  [phone]  Name: Roper Hoeg. MRN: [id number] Date of Birth: 22-Jul-1955

## 2016-03-10 ENCOUNTER — Other Ambulatory Visit: Payer: Self-pay | Admitting: Family Medicine

## 2016-03-10 DIAGNOSIS — R972 Elevated prostate specific antigen [PSA]: Secondary | ICD-10-CM

## 2016-03-10 NOTE — Telephone Encounter (Signed)
Pt called and is requesting a refill on his finasteride (PROSCAR) 5 MG tablet and would like it for 90 days. Please advise, thank you!  Pharmacy - [facility], [place] - [address]  Call pt @ [phone]

## 2016-03-10 NOTE — Telephone Encounter (Signed)
Last filled 12/29/15 by Zara Council

## 2016-03-11 ENCOUNTER — Other Ambulatory Visit: Payer: Self-pay

## 2016-03-11 DIAGNOSIS — R972 Elevated prostate specific antigen [PSA]: Secondary | ICD-10-CM

## 2016-03-11 MED ORDER — FINASTERIDE 5 MG PO TABS: 5.0000 mg | ORAL_TABLET | Freq: Every day | ORAL | 3 refills | Status: DC

## 2016-03-11 NOTE — Telephone Encounter (Signed)
This refill should come from urology. He should contact them to request this.

## 2016-03-11 NOTE — Telephone Encounter (Signed)
Patient notified

## 2016-03-17 ENCOUNTER — Ambulatory Visit (INDEPENDENT_AMBULATORY_CARE_PROVIDER_SITE_OTHER): Payer: Medicare Other | Admitting: Internal Medicine

## 2016-03-17 ENCOUNTER — Encounter: Payer: Self-pay | Admitting: Internal Medicine

## 2016-03-17 VITALS — BP 120/88 | HR 55 | Ht 70.0 in | Wt 197.5 lb

## 2016-03-17 DIAGNOSIS — I483 Typical atrial flutter: Secondary | ICD-10-CM | POA: Diagnosis not present

## 2016-03-17 DIAGNOSIS — R0683 Snoring: Secondary | ICD-10-CM

## 2016-03-17 DIAGNOSIS — F3289 Other specified depressive episodes: Secondary | ICD-10-CM

## 2016-03-17 DIAGNOSIS — R001 Bradycardia, unspecified: Secondary | ICD-10-CM

## 2016-03-17 NOTE — Progress Notes (Signed)
Patient Care Team: [name], MD as PCP - General (Family Medicine) Robert Bellow, MD (General Surgery)   HPI  Tyler Cork Mahyar Vink. is a 61 y.o. male Seen in follow-up for ablation of atrial flutter undertaken 12/17.  No recurrent arrhythmia.  [name] has chronic fatigue and is undergoing sleep testing  Records and Results Reviewed*   Past Medical History:  Diagnosis Date  . Anxiety   . BPH (benign prostatic hyperplasia) 11/19/2013  . 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 Bulging disc    C2/3, C3/4, C6/7  . Cervical disc herniation    C4/5 and C5/6  . Colon polyp 04/18/2011  . Constipation 08/31/2012  . Depression   . Diverticulosis   . 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 GERD (gastroesophageal reflux disease)   . H/O diverticulitis of colon   . Hemorrhoid   . Hiatal hernia   . Hyperlipidemia   . Levoscoliosis   . Sleep difficulties   . Spinal stenosis in cervical region    cord abutment C4/5  . Testicular pain, left   . Typical atrial flutter ([place]) 11/2015   a. CHADS2VASc => 0; b. on Eliquis for pending ablation     Past Surgical History:  Procedure Laterality Date  . ATRIAL FLUTTER ABLATION    . ELECTROPHYSIOLOGIC STUDY N/A 01/04/2016   Procedure: Cardioversion;  Surgeon: Wellington Hampshire, MD;  Location: [facility] ORS;  Service: Cardiovascular;  Laterality: N/A;  . ELECTROPHYSIOLOGIC STUDY N/A 02/17/2016   Procedure: A-Flutter Ablation;  Surgeon: Deboraha Sprang, MD;  Location: [place] CV LAB;  Service: Cardiovascular;  Laterality: N/A;  . GANGLION CYST EXCISION Right 1994   wrist & back  . HERNIA REPAIR Left 1994   abdominal repair with mesh  . NASAL SEPTUM SURGERY  2011   Dr. [name]     Current Outpatient Prescriptions  Medication Sig Dispense Refill  . ALPRAZolam (XANAX) 0.5 MG tablet TAKE 1 TABLET BY MOUTH EVERY NIGHT AT BEDTIME AS NEEDED FOR ANXIETY 20 tablet 0  . atorvastatin (LIPITOR) 10  MG tablet Take 1 tablet (10 mg total) by mouth every morning. 90 tablet 0  . cyclobenzaprine (FLEXERIL) 10 MG tablet TAKE 1 TABLET(10 MG) BY MOUTH THREE TIMES DAILY AS NEEDED FOR MUSCLE SPASMS 90 tablet 0  . escitalopram (LEXAPRO) 20 MG tablet Take 1 tablet (20 mg total) by mouth daily. 90 tablet 3  . finasteride (PROSCAR) 5 MG tablet Take 1 tablet (5 mg total) by mouth daily. 90 tablet 3  . meloxicam (MOBIC) 15 MG tablet Take 1 tablet (15 mg total) by mouth daily as needed for pain. 90 tablet 1  . mirabegron ER (MYRBETRIQ) 50 MG TB24 tablet Take 50 mg by mouth daily.    . pantoprazole (PROTONIX) 40 MG tablet Take 1 tablet (40 mg total) by mouth daily. 90 tablet 1  . Probiotic Product (ALIGN) 4 MG CAPS Take 1 capsule by mouth daily (Patient taking differently: Take 1 capsule by mouth daily as needed. For bowel regularity) 30 capsule 3  . tadalafil (CIALIS) 10 MG tablet Take 0.5 tablets (5 mg total) by mouth daily as needed. 30 tablet 0  . triamcinolone (NASACORT) 55 MCG/ACT AERO nasal inhaler Place 2 sprays into the nose daily as needed. For allergies     No current facility-administered medications for this visit.     Allergies  Allergen Reactions  . Other Other (See  Comments)    Pollen:  Sinus infection, cough, fatigue      Review of Systems negative except from HPI and PMH  Physical Exam Ht 5\' 10"  (1.778 m)   Wt 197 lb 8 oz (89.6 kg)   BMI 28.34 kg/m  Well developed and well nourished in no acute distress HENT normal E scleral and icterus clear Neck Supple JVP flat;   Clear to ausculation  Regular rate and rhythm, no murmurs gallops or rub Soft with active bowel sounds No clubbing cyanosis  Edema Alert and oriented, grossly normal motor and sensory function Skin Warm and Dry  ECG demonstrates sinus rhythm at 55 Intervals 18/11/41  Assessment and  Plan   Atrial flutter status post ablation  Sinus bradycardia-asymptomatic  Chronic fatigue/sleep disordered  breathing  Depression    At this juncture no further cardiac follow-up as necessary.   [name] is scheduled to undergo further sleep testing which is I think really important. Hopefully addressing this will also help his chronic depression         Current medicines are reviewed at length with the patient today .  The patient does not have concerns regarding medicines.

## 2016-03-17 NOTE — Patient Instructions (Signed)
Medication Instructions: - Your physician recommends that you continue on your current medications as directed. Please refer to the Current Medication list given to you today.  Labwork: - none ordered  Procedures/Testing: - none ordered  Follow-Up: - Dr. Klein will see you back on an as needed basis.  Any Additional Special Instructions Will Be Listed Below (If Applicable).     If you need a refill on your cardiac medications before your next appointment, please call your pharmacy.   

## 2016-03-21 ENCOUNTER — Other Ambulatory Visit: Payer: Self-pay | Admitting: Family Medicine

## 2016-03-21 ENCOUNTER — Ambulatory Visit: Payer: Medicare Other | Attending: Urology | Admitting: Physical Therapy

## 2016-03-21 DIAGNOSIS — R278 Other lack of coordination: Secondary | ICD-10-CM | POA: Diagnosis not present

## 2016-03-21 DIAGNOSIS — R2689 Other abnormalities of gait and mobility: Secondary | ICD-10-CM | POA: Diagnosis not present

## 2016-03-21 DIAGNOSIS — M542 Cervicalgia: Secondary | ICD-10-CM | POA: Diagnosis not present

## 2016-03-21 DIAGNOSIS — M5440 Lumbago with sciatica, unspecified side: Secondary | ICD-10-CM | POA: Diagnosis not present

## 2016-03-21 DIAGNOSIS — M4125 Other idiopathic scoliosis, thoracolumbar region: Secondary | ICD-10-CM

## 2016-03-21 NOTE — Therapy (Signed)
[place] MAIN [facility] SERVICES [address] [place], [place], [address] Phone: [phone]   Fax:  [phone]  Physical Therapy Treatment  Patient Details  Name: [name]. MRN: [id number] Date of Birth: 1955-11-22 Referring Provider: Zara Council  Encounter Date: 03/21/2016      PT End of Session - 03/21/16 0856    Visit Number 2   Number of Visits 12   Date for PT Re-Evaluation 05/24/16   Authorization Type g code   PT Start Time 0810   PT Stop Time 0900   PT Time Calculation (min) 50 min   Activity Tolerance Patient tolerated treatment well;No increased pain   Behavior During Therapy WFL for tasks assessed/performed      Past Medical History:  Diagnosis Date  . Anxiety   . BPH (benign prostatic hyperplasia) 11/19/2013  . 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 Bulging disc    C2/3, C3/4, C6/7  . Cervical disc herniation    C4/5 and C5/6  . Colon polyp 04/18/2011  . Constipation 08/31/2012  . Depression   . Diverticulosis   . 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 GERD (gastroesophageal reflux disease)   . H/O diverticulitis of colon   . Hemorrhoid   . Hiatal hernia   . Hyperlipidemia   . Levoscoliosis   . Sleep difficulties   . Spinal stenosis in cervical region    cord abutment C4/5  . Testicular pain, left   . Typical atrial flutter ([place]) 11/2015   a. CHADS2VASc => 0; b. on Eliquis for pending ablation     Past Surgical History:  Procedure Laterality Date  . ATRIAL FLUTTER ABLATION    . ELECTROPHYSIOLOGIC STUDY N/A 01/04/2016   Procedure: Cardioversion;  Surgeon: Wellington Hampshire, MD;  Location: [facility] ORS;  Service: Cardiovascular;  Laterality: N/A;  . ELECTROPHYSIOLOGIC STUDY N/A 02/17/2016   Procedure: A-Flutter Ablation;  Surgeon: Deboraha Sprang, MD;  Location: [place] CV LAB;  Service: Cardiovascular;  Laterality: N/A;  . GANGLION CYST EXCISION Right 1994    wrist & back  . HERNIA REPAIR Left 1994   abdominal repair with mesh  . NASAL SEPTUM SURGERY  2011   Dr. [name]     There were no vitals filed for this visit.      Subjective Assessment - 03/21/16 0814    Subjective Pt reported he had a headache and a neck ache after leaving last session which lasted until the next day. Pt has started to drink one more bottle of water but has not decreased his sodas.    Patient Stated Goals not to go to the bathroom so much             [facility] PT Assessment - 03/21/16 0912      Observation/Other Assessments   Observations slump posture, required cues      Palpation   Spinal mobility increased STM at paraspinal thoracic T4-10 L .                       [place] Adult PT Treatment/Exercise - 03/21/16 0918      Therapeutic Activites    Therapeutic Activities --  see pt instructions, explained POC     Manual Therapy   Manual therapy comments STM in supine, PA at T4-10 Grade mob I-II , MWM w/ HEP exercises  PT Education - 03/21/16 0911    Education provided Yes   Education Details HEP, explanation about customized stretches due to scoliotic spine, POC to treat scoliotic spine to yield long term benefits for pelvic floor function, continue with water intake   Person(s) Educated Patient   Methods Explanation;Demonstration;Tactile cues;Verbal cues;Handout   Comprehension Returned demonstration;Verbalized understanding             PT Long Term Goals - 03/21/16 0930      PT LONG TERM GOAL #1   Title Pt will decrease his score on PSQI from  %  <  % to indicate improved sleep and better managed nocturia    Time 12   Period Weeks   Status On-going     PT LONG TERM GOAL #2   Title Pt will demo decreased mm tensions on L paraspinals at T4-10 in order to show IND with managing pain related to scoliosis and to perform ADLs with less onset of pain    Time 12   Period Weeks   Status On-going     PT  LONG TERM GOAL #3   Title Pt will decrease his score on NDI from 72 %  to  < 62  % to increase his ADLs   Time 12   Period Weeks   Status On-going     PT LONG TERM GOAL #4   Title Pt will show decreased ODI score from 62  %   to   < 52   % in order to perform ADLs   Time 12   Period Weeks   Status On-going     PT LONG TERM GOAL #5   Title pt will demo IND with proper body mechanics with lifting, out of bed t/f, and sitting without cuing in order to improv e QOL   Time 12   Period Weeks   Status On-going     PT LONG TERM GOAL #6   Title pt will demo IND with scoliosis HEP in order to manage pain and return to fitness with decreased risk for relapse of Sx   Time 12   Period Weeks   Status On-going     PT LONG TERM GOAL #7   Title Pt will report centralization of radiating pain from his fingers to the cervical spine across 2 visits in order to increase his functional mobility   Time 12   Period Weeks   Status On-going     PT LONG TERM GOAL #8   Title Pt will report centralization of radiating pain from feet to his lumbar/glut region across 2 visits in order to walk 1-2 blocks.   Time 12   Period Weeks   Status New               Plan - 03/21/16 [id number]    Clinical Impression Statement Pt tolerated manual Tx with report of decreased pain from 9/10 to 7/10. Addressed thoracic scoliotic curve with HEP and plan to add strengthening HEP at next session. Explained the importance of addressing spinal dysfunctions to yield long term benefits for urinary and pelvic floor symptoms. Pt voiced understanding and expressed compliance to increasing water intake. Pt continues to benefit form skilled PT.     Rehab Potential Good   PT Frequency 1x / week   PT Duration 12 weeks   PT Treatment/Interventions ADLs/Self Care Home Management;Electrical Stimulation;Moist Heat;Traction;Gait training;Stair training;Neuromuscular re-education;Patient/family education;Therapeutic activities;Balance  training;Therapeutic exercise;Scar mobilization;Manual lymph drainage;Manual techniques;Energy conservation;Taping;Cryotherapy   Consulted and  Agree with Plan of Care Patient      Patient will benefit from skilled therapeutic intervention in order to improve the following deficits and impairments:  Decreased activity tolerance, Decreased balance, Increased muscle spasms, Postural dysfunction, Pain, Improper body mechanics, Hypomobility, Decreased range of motion, Decreased endurance, Difficulty walking, Decreased strength, Decreased coordination, Decreased mobility, Decreased safety awareness, Other (comment)  Visit Diagnosis: Other abnormalities of gait and mobility  Other idiopathic scoliosis, thoracolumbar region  Other lack of coordination  Cervicalgia  Low back pain with sciatica, sciatica laterality unspecified, unspecified back pain laterality, unspecified chronicity     Problem List Patient Active Problem List   Diagnosis Date Noted  . Typical atrial flutter ([place])   . Elevated PSA 12/24/2015  . Encounter for anticoagulation discussion and counseling   . Dyspnea   . Chronic neck pain   . Adjustment disorder with mixed anxiety and depressed mood 07/23/2015  . OAB (overactive bladder) 03/31/2015  . Generalized anxiety disorder 12/26/2013  . BPH (benign prostatic hyperplasia) 11/19/2013  . Hyperlipidemia 11/19/2013  . Spinal stenosis in cervical region 03/08/2013  . Lipoma of back 03/08/2013  . Routine general medical examination at a health care facility 08/31/2012  . Benign prostatic hyperplasia with urinary obstruction 08/23/2012  . 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 Colon polyp 04/18/2011    Jerl Mina ,PT, DPT, E-RYT  03/21/2016, 9:39 AM  [place] MAIN [facility] SERVICES [address] [place], [place], [address] Phone: [phone]   Fax:  [phone]  Name: [name]. MRN:  [id number] Date of Birth: 08/10/1955

## 2016-03-21 NOTE — Patient Instructions (Addendum)
Stretches for the scoliosis in your upper midback spine to release neck tensions     Laying down in bed   In the morning and night    Perform 10 reps each   one pillow  Under head to keep ears in line with shoulder    1) half snow angel: Start with both palms up  Lower R shoulder down, look over R shoulder Left palm up, drag arm like you are making a snow angel (do not lift arm off bed)  Once your L hand is about ~120 deg up, turn head back to middle, lower L arm down to side   Repeat 10 reps    2) self-hug L arm over R , self-hug Breath in, feel back of midback inflate, Exhale, chin up slightly, spread elbows apart and down like you are talking off a sweater   Repeat with 5 reps on L arm over R, 5 reps R over L     __________  Seated stretches throughout the day 3 x    1) self hug  --> lower hands by your side on the chair, behind you, "giraffe"  Lengthen your spine, gentle lift of chest upward while you press hand downward to chair      5 reps      2)  Pat your L hand on your back while keeping your head in line with shoulders (not forward like a turtle head) Lean to R  Lower elbow down     5 reps     ________________    Urinary goal for this week: Increased to 2 bottles total (32 fl oz)   Keep 2 sodas today.    ________________  Back pain:  Catch yourself to sit with better posture :  feet under knees Pelvis Back against chair

## 2016-03-22 NOTE — Telephone Encounter (Signed)
Last OV 12/24/15 last filled 01/06/16 20 0rf

## 2016-03-22 NOTE — Telephone Encounter (Signed)
faxed

## 2016-03-24 ENCOUNTER — Telehealth: Payer: Self-pay | Admitting: Family Medicine

## 2016-03-24 NOTE — Telephone Encounter (Signed)
I called pt and left a vm to call the office to sch AWV. Thank you! °

## 2016-03-25 ENCOUNTER — Ambulatory Visit: Payer: Medicare Other | Attending: Internal Medicine

## 2016-03-27 ENCOUNTER — Other Ambulatory Visit: Payer: Self-pay | Admitting: Family Medicine

## 2016-03-30 ENCOUNTER — Encounter: Payer: Self-pay | Admitting: Family Medicine

## 2016-03-30 ENCOUNTER — Ambulatory Visit (INDEPENDENT_AMBULATORY_CARE_PROVIDER_SITE_OTHER): Payer: Medicare Other | Admitting: Family Medicine

## 2016-03-30 VITALS — BP 140/86 | HR 64 | Temp 98.1°F | Wt 199.0 lb

## 2016-03-30 DIAGNOSIS — F4323 Adjustment disorder with mixed anxiety and depressed mood: Secondary | ICD-10-CM

## 2016-03-30 DIAGNOSIS — M25561 Pain in right knee: Secondary | ICD-10-CM | POA: Insufficient documentation

## 2016-03-30 DIAGNOSIS — M5441 Lumbago with sciatica, right side: Secondary | ICD-10-CM

## 2016-03-30 DIAGNOSIS — I483 Typical atrial flutter: Secondary | ICD-10-CM

## 2016-03-30 DIAGNOSIS — N401 Enlarged prostate with lower urinary tract symptoms: Secondary | ICD-10-CM | POA: Diagnosis not present

## 2016-03-30 DIAGNOSIS — G8929 Other chronic pain: Secondary | ICD-10-CM | POA: Diagnosis not present

## 2016-03-30 DIAGNOSIS — M5442 Lumbago with sciatica, left side: Secondary | ICD-10-CM

## 2016-03-30 DIAGNOSIS — R3916 Straining to void: Secondary | ICD-10-CM

## 2016-03-30 DIAGNOSIS — M545 Low back pain, unspecified: Secondary | ICD-10-CM | POA: Insufficient documentation

## 2016-03-30 MED ORDER — ALPRAZOLAM 0.5 MG PO TABS: ORAL_TABLET | ORAL | 0 refills | Status: DC

## 2016-03-30 MED ORDER — TADALAFIL 10 MG PO TABS: 5.0000 mg | ORAL_TABLET | Freq: Every day | ORAL | 0 refills | Status: DC | PRN

## 2016-03-30 MED ORDER — ESCITALOPRAM OXALATE 10 MG PO TABS: 10.0000 mg | ORAL_TABLET | Freq: Every day | ORAL | 2 refills | Status: DC

## 2016-03-30 NOTE — Assessment & Plan Note (Signed)
Patient with depression and anxiety. Not taking any medicines at this time. We'll restart on Lexapro 10 mg daily. Xanax as needed. Given return precautions.

## 2016-03-30 NOTE — Progress Notes (Signed)
Tyler Rumps, MD Phone: [phone]  [name]. is a 61 y.o. male who presents today for follow-up.  Anxiety: Patient notes it is still there. Some mild depression at times as well. He notes he is most anxious regarding daily issues and has trouble shutting his brain off at night. He is not taking Lexapro. He hasn't taken Xanax recently either.  Patient had an ablation for atrial flutter. He notes that went well and he is not had any significant symptoms since the ablation. He has followed up with Dr. Caryl Comes for this.  BPH: Patient's been taking Cialis and Myrbetriq to help with this. He has had some issues with his insurance company with getting Cialis. He notes when he is not on the Cialis he has to strain and start and stop his urinary stream. He also gets up 2-4 times a night when he is not taking it. He does feel like he empties his bladder. He has significantly improved symptoms when he is taking Cialis.  Right knee pain: Patient notes over the last several months his knee will occasionally catch on him and feels that is going to give out. He notes no injury. Does not lock or pop on him. He has not done anything for it. It is not bothering him at this time.  Low back pain: Patient has a history of scoliosis. He has chronic issues with his low back. He has done some physical therapy for this. Occasionally it bothers him in his back. Does occasionally get some sciatica down the right and left leg. No numbness or weakness. No loss of bowel or bladder function. No saddle anesthesia.  PMH: Former smoker   ROS see history of present illness  Objective  Physical Exam Vitals:   03/30/16 0757  BP: 140/86  Pulse: 64  Temp: 98.1 F (36.7 C)    BP Readings from Last 3 Encounters:  03/30/16 140/86  03/17/16 120/88  02/17/16 130/89   Wt Readings from Last 3 Encounters:  03/30/16 199 lb (90.3 kg)  03/17/16 197 lb 8 oz (89.6 kg)  02/17/16 195 lb (88.5 kg)    Physical Exam    Constitutional: No distress.  Cardiovascular: Normal rate, regular rhythm and normal heart sounds.   Pulmonary/Chest: Effort normal and breath sounds normal.  Musculoskeletal: He exhibits no edema.  Right knee with lateral joint line tenderness, no other tenderness in the knee, no warmth or erythema, no swelling, popping on McMurray's though no pain, no ligamentous laxity, left knee with no tenderness, warmth, or erythema, no swelling, negative McMurray's, no ligamentous laxity  Neurological: He is alert. Gait normal.  Skin: Skin is warm and dry. He is not diaphoretic.  Psychiatric:  Mood anxious, affect anxious     Assessment/Plan: Please see individual problem list.  BPH (benign prostatic hyperplasia) Patient on Cialis and myrbetriq for his BPH. Has had issues getting this with his insurance company. We will resubmit this to his pharmacy and see if they will send [place] an appeal.  Adjustment disorder with mixed anxiety and depressed mood Patient with depression and anxiety. Not taking any medicines at this time. We'll restart on Lexapro 10 mg daily. Xanax as needed. Given return precautions.  Chronic low back pain We'll refer to orthopedics.  Right knee pain Concern for degenerative meniscal issue. We'll refer to orthopedics.  Typical atrial flutter ([place]) Doing well status post ablation. Continue to follow with cardiology.   Orders Placed This Encounter  Procedures  . Ambulatory referral to  Orthopedic Surgery    Referral Priority:   Routine    Referral Type:   Surgical    Referral Reason:   Specialty Services Required    Requested Specialty:   Orthopedic Surgery    Number of Visits Requested:   1    Meds ordered this encounter  Medications  . ALPRAZolam (XANAX) 0.5 MG tablet    Sig: TAKE 1 TABLET BY MOUTH EVERY NIGHT AT BEDTIME AS NEEDED FOR ANXIETY    Dispense:  20 tablet    Refill:  0  . escitalopram (LEXAPRO) 10 MG tablet    Sig: Take 1 tablet (10 mg total) by mouth  daily.    Dispense:  90 tablet    Refill:  2  . tadalafil (CIALIS) 10 MG tablet    Sig: Take 0.5 tablets (5 mg total) by mouth daily as needed.    Dispense:  30 tablet    Refill:  0    Insurance approved re order    Tyler Rumps, MD [place]

## 2016-03-30 NOTE — Assessment & Plan Note (Signed)
Concern for degenerative meniscal issue. We'll refer to orthopedics.

## 2016-03-30 NOTE — Assessment & Plan Note (Signed)
We'll refer to orthopedics.

## 2016-03-30 NOTE — Assessment & Plan Note (Signed)
Doing well status post ablation. Continue to follow with cardiology.

## 2016-03-30 NOTE — Patient Instructions (Signed)
Nice to see you. We will refer you to orthopedics for your knee. We will resubmit your cialis and file the appeal once we get this.  We will restart you on lexapro. If you develop worsening knee pain, worsening anxiety or depression, thoughts of harming yourself or others, or any new or change in symptoms please seek medical attention medially.

## 2016-03-30 NOTE — Assessment & Plan Note (Addendum)
Patient on Cialis and myrbetriq for his BPH. Has had issues getting this with his insurance company. We will resubmit this to his pharmacy and see if they will send [place] an appeal.

## 2016-03-30 NOTE — Progress Notes (Signed)
Pre visit review using our clinic review tool, if applicable. No additional management support is needed unless otherwise documented below in the visit note. 

## 2016-04-04 ENCOUNTER — Ambulatory Visit: Payer: Medicare Other | Admitting: Physical Therapy

## 2016-04-04 DIAGNOSIS — M5440 Lumbago with sciatica, unspecified side: Secondary | ICD-10-CM | POA: Diagnosis not present

## 2016-04-04 DIAGNOSIS — R278 Other lack of coordination: Secondary | ICD-10-CM

## 2016-04-04 DIAGNOSIS — M542 Cervicalgia: Secondary | ICD-10-CM | POA: Diagnosis not present

## 2016-04-04 DIAGNOSIS — R2689 Other abnormalities of gait and mobility: Secondary | ICD-10-CM | POA: Diagnosis not present

## 2016-04-04 DIAGNOSIS — M4125 Other idiopathic scoliosis, thoracolumbar region: Secondary | ICD-10-CM | POA: Diagnosis not present

## 2016-04-04 NOTE — Patient Instructions (Addendum)
Read the research on pain science and the implications to sleep apnea and the benefits of managing it with CPAP use to decrease nighttime voiding and improve other health issues    Follow up with sleep clinic on CPAP machine. Practice getting used to it or contact clinic for another type of machine that works better. Do not give up on it. Please refer to sleep handouts on the benefits to getting OSA managed    Log roll out of bed to decrease strain on back   Complete exercise chart to increase compliance 4x/ week instead of 4/ per 2 weeks.   _stretches  _deep core level 2 for strengthening   You are now ready to begin training the deep core muscles system: diaphragm, transverse abdominis, pelvic floor . These muscles must work together as a team.           The key to these exercises to train the brain to coordinate the timing of these muscles and to have them turn on for long periods of time to hold you upright against gravity (especially important if you are on your feet all day).These muscles are postural muscles and play a role stabilizing your spine and bodyweight. By doing these repetitions slowly and correctly instead of doing crunches, you will achieve a flatter belly without a lower pooch. You are also placing your spine in a more neutral position and breathing properly which in turn, decreases your risk for problems related to your pelvic floor, abdominal, and low back such as pelvic organ prolapse, hernias, diastasis recti (separation of superficial muscles), disk herniations, spinal fractures. These exercises set a solid foundation for you to later progress to resistance/ strength training with therabands and weights and return to other typical fitness exercises with a stronger deeper core.   Do level 1 : 10 reps Do level 2: 10 reps (left and right = 1 rep) x 3 sets , 2 x day Do not progress to level 3 for 3-4 weeks. You know you are ready when you do not have any rocking of  pelvis nor arching in your back

## 2016-04-04 NOTE — Therapy (Signed)
[place] MAIN [facility] SERVICES [address] [place], [place], [address] Phone: [phone]   Fax:  [phone]  Physical Therapy Treatment  Patient Details  Name: [name]. MRN: [id number] Date of Birth: Nov 03, 1955 Referring Provider: Zara Council  Encounter Date: 04/04/2016      PT End of Session - 04/04/16 0921    Visit Number 3   Number of Visits 12   Date for PT Re-Evaluation 05/24/16   Authorization Type g code   PT Start Time 0805   PT Stop Time 0904   PT Time Calculation (min) 59 min   Activity Tolerance Patient tolerated treatment well;No increased pain   Behavior During Therapy WFL for tasks assessed/performed      Past Medical History:  Diagnosis Date  . Anxiety   . BPH (benign prostatic hyperplasia) 11/19/2013  . 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 Bulging disc    C2/3, C3/4, C6/7  . Cervical disc herniation    C4/5 and C5/6  . Colon polyp 04/18/2011  . Constipation 08/31/2012  . Depression   . Diverticulosis   . 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 GERD (gastroesophageal reflux disease)   . H/O diverticulitis of colon   . Hemorrhoid   . Hiatal hernia   . Hyperlipidemia   . Levoscoliosis   . Sleep difficulties   . Spinal stenosis in cervical region    cord abutment C4/5  . Testicular pain, left   . Typical atrial flutter ([place]) 11/2015   a. CHADS2VASc => 0; b. on Eliquis for pending ablation     Past Surgical History:  Procedure Laterality Date  . ATRIAL FLUTTER ABLATION    . ELECTROPHYSIOLOGIC STUDY N/A 01/04/2016   Procedure: Cardioversion;  Surgeon: Wellington Hampshire, MD;  Location: [facility] ORS;  Service: Cardiovascular;  Laterality: N/A;  . ELECTROPHYSIOLOGIC STUDY N/A 02/17/2016   Procedure: A-Flutter Ablation;  Surgeon: Deboraha Sprang, MD;  Location: [place] [facility];  Service: Cardiovascular;  Laterality: N/A;  . GANGLION CYST EXCISION Right 1994    wrist & back  . HERNIA REPAIR Left 1994   abdominal repair with mesh  . NASAL SEPTUM SURGERY  2011   Dr. [name]     There were no vitals filed for this visit.      Subjective Assessment - 04/04/16 0816    Subjective Pt reported he has not been doing his exercises. Pt will be seeing his orthopedic MD for R knee pain. Pt still is drinking one bottle of water.  Pt has been tested positive for OSA but he felt very claustophobic with CPAP machine trials. Pt has the machine at home and was advised to practice using it.    Patient Stated Goals not to go to the bathroom so much             [facility] PT Assessment - 04/04/16 0916      Observation/Other Assessments   Observations cued for log rolling. self-corrected end of session with upright sitting     Posture/Postural Control   Posture Comments minor cuing for stabilty deep core level 2                     [facility] Adult PT Treatment/Exercise - 04/04/16 0916      Therapeutic Activites    Therapeutic Activities --  see pt instructions      Neuro Re-ed  Neuro Re-ed Details  see pt instructions                PT Education - 04/04/16 0840    Education provided Yes   Education Details pt education, heavy emphasis on follow through with CPAP compliance to address night time voiding and cardiac conditions, HEP   Person(s) Educated Patient   Methods Explanation;Demonstration;Tactile cues;Verbal cues;Handout   Comprehension Returned demonstration;Verbalized understanding             PT Long Term Goals - 04/04/16 0817      PT LONG TERM GOAL #1   Title Pt will decrease his score on PSQI from  %  <  % to indicate improved sleep and better managed nocturia    Time 12   Period Weeks   Status On-going     PT LONG TERM GOAL #2   Title Pt will demo decreased mm tensions on L paraspinals at T4-10 in order to show IND with managing pain related to scoliosis and to perform ADLs with less onset of pain    Time  12   Period Weeks   Status On-going     PT LONG TERM GOAL #3   Title Pt will decrease his score on NDI from 72 %  to  < 62  % to increase his ADLs   Time 12   Period Weeks   Status On-going     PT LONG TERM GOAL #4   Title Pt will show decreased ODI score from 62  %   to   < 52   % in order to perform ADLs   Time 12   Period Weeks   Status On-going     PT LONG TERM GOAL #5   Title pt will demo IND with proper body mechanics with lifting, out of bed t/f, and sitting without cuing in order to improv e QOL   Time 12   Period Weeks   Status On-going     PT LONG TERM GOAL #6   Title pt will demo IND with scoliosis HEP in order to manage pain and return to fitness with decreased risk for relapse of Sx   Time 12   Period Weeks   Status On-going     PT LONG TERM GOAL #7   Title Pt will report centralization of radiating pain from his fingers to the cervical spine across 2 visits in order to increase his functional mobility   Time 12   Period Weeks   Status On-going     PT LONG TERM GOAL #8   Title Pt will report centralization of radiating pain from feet to his lumbar/glut region across 2 visits in order to walk 1-2 blocks.   Time 12   Period Weeks   Status New               Plan - 04/04/16 [id number]    Clinical Impression Statement Pt required biopsychosocial approaches and motivational interviewing to increase compliance to HEP. PT emphasized pt's compliance to CPAP use and provided info on the importance of OSA management for nocturia and decreasing risks for many health issues. Pt voiced understanding and expressed interest in continuing with PT and will try to use the check off grid sheet to build HEP compliance. Progressed pt to deep core strengthening. Pt will continues to benefit from skilled PT    Rehab Potential Good   PT Frequency 1x / week   PT Duration 12 weeks  PT Treatment/Interventions ADLs/Self Care Home Management;Electrical Stimulation;Moist  Heat;Traction;Gait training;Stair training;Neuromuscular re-education;Patient/family education;Therapeutic activities;Balance training;Therapeutic exercise;Scar mobilization;Manual lymph drainage;Manual techniques;Energy conservation;Taping;Cryotherapy   Consulted and Agree with Plan of Care Patient      Patient will benefit from skilled therapeutic intervention in order to improve the following deficits and impairments:  Decreased activity tolerance, Decreased balance, Increased muscle spasms, Postural dysfunction, Pain, Improper body mechanics, Hypomobility, Decreased range of motion, Decreased endurance, Difficulty walking, Decreased strength, Decreased coordination, Decreased mobility, Decreased safety awareness, Other (comment)  Visit Diagnosis: Other abnormalities of gait and mobility  Other idiopathic scoliosis, thoracolumbar region  Other lack of coordination  Cervicalgia  Low back pain with sciatica, sciatica laterality unspecified, unspecified back pain laterality, unspecified chronicity     Problem List Patient Active Problem List   Diagnosis Date Noted  . Chronic low back pain 03/30/2016  . Right knee pain 03/30/2016  . Typical atrial flutter ([place])   . Elevated PSA 12/24/2015  . Encounter for anticoagulation discussion and counseling   . Dyspnea   . Chronic neck pain   . Adjustment disorder with mixed anxiety and depressed mood 07/23/2015  . OAB (overactive bladder) 03/31/2015  . Generalized anxiety disorder 12/26/2013  . BPH (benign prostatic hyperplasia) 11/19/2013  . Hyperlipidemia 11/19/2013  . Spinal stenosis in cervical region 03/08/2013  . Lipoma of back 03/08/2013  . Routine general medical examination at a health care facility 08/31/2012  . Benign prostatic hyperplasia with urinary obstruction 08/23/2012  . 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 Colon polyp 04/18/2011    Jerl Mina ,PT, DPT, E-RYT  04/04/2016, 7:57 PM  [place] MAIN [facility] SERVICES [address] [place], [place], [address] Phone: [phone]   Fax:  [phone]  Name: [name]. MRN: [id number] Date of Birth: Jun 09, 1955

## 2016-04-06 ENCOUNTER — Telehealth: Payer: Self-pay | Admitting: Family Medicine

## 2016-04-06 NOTE — Telephone Encounter (Signed)
Pt will call back to sch AWV. Thank you!

## 2016-04-18 ENCOUNTER — Encounter: Payer: Medicare Other | Admitting: Physical Therapy

## 2016-04-19 ENCOUNTER — Other Ambulatory Visit: Payer: Self-pay | Admitting: Family Medicine

## 2016-04-19 MED ORDER — TADALAFIL 10 MG PO TABS: 5.0000 mg | ORAL_TABLET | Freq: Every day | ORAL | 0 refills | Status: DC | PRN

## 2016-04-19 NOTE — Telephone Encounter (Signed)
Can you please check with the insurance company to see if there is anything we can do? He is on this for BPH. Thanks.

## 2016-04-19 NOTE — Telephone Encounter (Signed)
Last OV 03/30/16 last filled 03/30/16 30 0rf

## 2016-04-19 NOTE — Telephone Encounter (Signed)
Pt called and stated that the insurance company is still not approving his tadalafil (CIALIS) 10 MG tablet as being requested, and he also needs a refill.. Please advise, thank you!  Call pt @ [phone]

## 2016-04-25 DIAGNOSIS — G8929 Other chronic pain: Secondary | ICD-10-CM | POA: Diagnosis not present

## 2016-04-25 DIAGNOSIS — M25561 Pain in right knee: Secondary | ICD-10-CM | POA: Diagnosis not present

## 2016-04-25 DIAGNOSIS — M1711 Unilateral primary osteoarthritis, right knee: Secondary | ICD-10-CM | POA: Diagnosis not present

## 2016-04-28 NOTE — Telephone Encounter (Signed)
Patient Cialis authorization has been resubmitted for BPH, will ltake at least 24 to 48 hours to approve or deny medication, Patient is aware and I also had patient fill out an AUA score via phone which I ned on all patient who are on Cialis for  BPH. I placed form on Jessica's desk need PCP initials to file to chart.

## 2016-04-29 DIAGNOSIS — K5732 Diverticulitis of large intestine without perforation or abscess without bleeding: Secondary | ICD-10-CM | POA: Diagnosis not present

## 2016-04-29 DIAGNOSIS — R1032 Left lower quadrant pain: Secondary | ICD-10-CM | POA: Diagnosis not present

## 2016-04-29 DIAGNOSIS — K5900 Constipation, unspecified: Secondary | ICD-10-CM | POA: Diagnosis not present

## 2016-04-29 NOTE — Telephone Encounter (Signed)
noted 

## 2016-05-02 ENCOUNTER — Ambulatory Visit (INDEPENDENT_AMBULATORY_CARE_PROVIDER_SITE_OTHER): Payer: Medicare Other | Admitting: Family Medicine

## 2016-05-02 ENCOUNTER — Encounter: Payer: Self-pay | Admitting: Family Medicine

## 2016-05-02 VITALS — BP 120/84 | HR 81 | Temp 98.0°F | Wt 200.8 lb

## 2016-05-02 DIAGNOSIS — K5792 Diverticulitis of intestine, part unspecified, without perforation or abscess without bleeding: Secondary | ICD-10-CM

## 2016-05-02 DIAGNOSIS — R221 Localized swelling, mass and lump, neck: Secondary | ICD-10-CM | POA: Diagnosis not present

## 2016-05-02 NOTE — Assessment & Plan Note (Signed)
Patient with somewhat nonspecific left-sided neck swelling and into the left sided supraclavicular region. There are no palpable defects or lymphadenopathy. No tenderness. We will obtain an ultrasound to evaluate this area further. He'll continue to monitor.

## 2016-05-02 NOTE — Progress Notes (Signed)
Pre visit review using our clinic review tool, if applicable. No additional management support is needed unless otherwise documented below in the visit note. 

## 2016-05-02 NOTE — Patient Instructions (Signed)
Nice to see you. Please finish the course of antibiotics prescribed by the urgent care. You should eat a bland diet and try to stay well hydrated. Please watch the area of puffiness in your neck. We will obtain an ultrasound to evaluate this area. If you develop worsening abdominal pain, blood in your stool, fevers, or any new or changing symptoms please seek medical attention immediately.

## 2016-05-02 NOTE — Progress Notes (Signed)
  [name], MD Phone: [phone]  [name]. is a 61 y.o. male who presents today for follow-up.  Patient was seen at urgent care on Friday for left lower quadrant abdominal pain. He reports he was diagnosed with diverticulitis. They placed him on ciprofloxacin and Flagyl. They also advised him to take Colace and Senokot. He notes his pain is significantly less. He is having small bowel movements. He had a small amount of diarrhea on Thursday. It was dark brown. He notes no blood in the stool. No nausea or vomiting. He's tried to stay away from fried foods though is otherwise eating a normal diet.  Patient notes for several months the left anterior lateral aspect of his neck and into his supraclavicular region have been somewhat "puffy." Patient reports rare discomfort though no specific pain. He notes occasionally it will swell up and then come back down. He has never had anything like this before. He has had no trouble swallowing.  ROS see history of present illness  Objective  Physical Exam Vitals:   05/02/16 0810  BP: 120/84  Pulse: 81  Temp: 98 F (36.7 C)    BP Readings from Last 3 Encounters:  05/02/16 120/84  03/30/16 140/86  03/17/16 120/88   Wt Readings from Last 3 Encounters:  05/02/16 200 lb 12.8 oz (91.1 kg)  03/30/16 199 lb (90.3 kg)  03/17/16 197 lb 8 oz (89.6 kg)    Physical Exam  Constitutional: No distress.  HENT:  Head: Normocephalic and atraumatic.  Neck: Neck supple. No thyromegaly present.  Mild puffiness to the left anterior lateral neck and into the left supraclavicular region, there are no palpable underlying abnormalities, there is no lymphadenopathy in the left supraclavicular region or right supraclavicular region  Cardiovascular: Normal rate, regular rhythm and normal heart sounds.   Pulmonary/Chest: Effort normal and breath sounds normal.  Abdominal: Soft. Bowel sounds are normal. He exhibits no distension. There is no tenderness.  There is no rebound and no guarding.  Lymphadenopathy:    He has no cervical adenopathy.  Neurological: He is alert.  Skin: Skin is warm and dry. He is not diaphoretic.     Assessment/Plan: Please see individual problem list.  Diverticulitis Patient diagnosed with diverticulitis at urgent care. Has progressively been improving on ciprofloxacin and Flagyl. Benign abdominal exam today. Advised to finish the antibiotics. He needs to eat a bland diet until his stomach pain is gone. Discussed monitoring if not continuing to improve or if he worsens again he should be reevaluated immediately. Given return precautions.  Neck swelling Patient with somewhat nonspecific left-sided neck swelling and into the left sided supraclavicular region. There are no palpable defects or lymphadenopathy. No tenderness. We will obtain an ultrasound to evaluate this area further. He'll continue to monitor.   Orders Placed This Encounter  Procedures  . US Soft Tissue Head/Neck    Standing Status:   Future    Standing Expiration Date:   07/02/2017    Order Specific Question:   Reason for Exam (SYMPTOM  OR DIAGNOSIS REQUIRED)    Answer:   left anterolateral neck swelling and supraclavicular swelling for several months, no pain    Order Specific Question:   Preferred imaging location?    Answer:   [facility]    [name], MD [place]

## 2016-05-02 NOTE — Assessment & Plan Note (Signed)
Patient diagnosed with diverticulitis at urgent care. Has progressively been improving on ciprofloxacin and Flagyl. Benign abdominal exam today. Advised to finish the antibiotics. He needs to eat a bland diet until his stomach pain is gone. Discussed monitoring if not continuing to improve or if he worsens again he should be reevaluated immediately. Given return precautions.

## 2016-05-03 ENCOUNTER — Telehealth: Payer: Self-pay | Admitting: Urology

## 2016-05-03 NOTE — Telephone Encounter (Signed)
Spoke with pt in reference to myrbetriq samples. Made pt aware we are currently out of samples but will call when we get some. Pt voiced understanding.

## 2016-05-03 NOTE — Telephone Encounter (Signed)
Pt calls office stating that he needs more mybetrique samples. Please advise.

## 2016-05-06 ENCOUNTER — Encounter: Payer: Medicare Other | Admitting: Physical Therapy

## 2016-05-09 ENCOUNTER — Telehealth: Payer: Self-pay | Admitting: *Deleted

## 2016-05-09 NOTE — Telephone Encounter (Signed)
:   Spoke with patient. He notes no abdominal pain though does note issues with constipation and occasional discomfort with this. Advised we should reevaluate in the office to determine the next step. He's been using Senokot to help have bowel movements. We will see him tomorrow at 9:45 AM. Advised if he develops abdominal pain or blood in the stool he should be reevaluated.

## 2016-05-09 NOTE — Telephone Encounter (Signed)
Please advise 

## 2016-05-09 NOTE — Telephone Encounter (Signed)
Patient requested to know if he should have a colonostomy, Pt is currently has no pain, he feels some discomfort on the left side.  Pt contact [phone]

## 2016-05-10 ENCOUNTER — Encounter: Payer: Self-pay | Admitting: Family Medicine

## 2016-05-10 ENCOUNTER — Ambulatory Visit
Admission: RE | Admit: 2016-05-10 | Discharge: 2016-05-10 | Disposition: A | Discharge status: Home / Self Care | Payer: Medicare Other | Source: Ambulatory Visit | Attending: Family Medicine | Admitting: Family Medicine

## 2016-05-10 ENCOUNTER — Other Ambulatory Visit: Payer: Self-pay | Admitting: Internal Medicine

## 2016-05-10 ENCOUNTER — Other Ambulatory Visit: Payer: Self-pay | Admitting: Family Medicine

## 2016-05-10 ENCOUNTER — Ambulatory Visit (INDEPENDENT_AMBULATORY_CARE_PROVIDER_SITE_OTHER): Payer: Medicare Other | Admitting: Family Medicine

## 2016-05-10 ENCOUNTER — Telehealth: Payer: Self-pay | Admitting: Radiology

## 2016-05-10 VITALS — BP 112/80 | HR 83 | Temp 98.1°F | Wt 200.8 lb

## 2016-05-10 DIAGNOSIS — K4091 Unilateral inguinal hernia, without obstruction or gangrene, recurrent: Secondary | ICD-10-CM

## 2016-05-10 DIAGNOSIS — R195 Other fecal abnormalities: Secondary | ICD-10-CM

## 2016-05-10 DIAGNOSIS — N400 Enlarged prostate without lower urinary tract symptoms: Secondary | ICD-10-CM | POA: Insufficient documentation

## 2016-05-10 DIAGNOSIS — R221 Localized swelling, mass and lump, neck: Secondary | ICD-10-CM | POA: Diagnosis present

## 2016-05-10 DIAGNOSIS — R1032 Left lower quadrant pain: Secondary | ICD-10-CM

## 2016-05-10 DIAGNOSIS — K4090 Unilateral inguinal hernia, without obstruction or gangrene, not specified as recurrent: Secondary | ICD-10-CM | POA: Insufficient documentation

## 2016-05-10 DIAGNOSIS — K5792 Diverticulitis of intestine, part unspecified, without perforation or abscess without bleeding: Secondary | ICD-10-CM

## 2016-05-10 DIAGNOSIS — K5730 Diverticulosis of large intestine without perforation or abscess without bleeding: Secondary | ICD-10-CM | POA: Insufficient documentation

## 2016-05-10 MED ORDER — LACTULOSE 10 GM/15ML PO SOLN: 10.0000 g | Freq: Every day | ORAL | 0 refills | Status: DC | PRN

## 2016-05-10 MED ORDER — IOPAMIDOL (ISOVUE-300) INJECTION 61%
100.0000 mL | Freq: Once | INTRAVENOUS | Status: AC | PRN
  Administered 2016-05-10: 100 mL via INTRAVENOUS

## 2016-05-10 NOTE — Progress Notes (Signed)
Pre visit review using our clinic review tool, if applicable. No additional management support is needed unless otherwise documented below in the visit note. 

## 2016-05-10 NOTE — Patient Instructions (Signed)
Nice to see you. We'll get a scan of your abdomen and pelvis today to evaluate for a cause of your discomfort. If you have worsening pain or develop blood in your stool or any new symptoms please seek medical attention medially.

## 2016-05-10 NOTE — Assessment & Plan Note (Signed)
Patient previously diagnosed with diverticulitis on clinical basis. Had been improving when last evaluated though has stabilized with mild discomfort in his left lower quadrant. Mild tenderness today in the left lower quadrant. Does note some constipation and penciling of stools as well. Given his tenderness and stool penciling we will obtain a CT scan abdomen and pelvis to evaluate this area. He will also be referred to GI. We will determine if we should treat his constipation after his CT scan. Given return precautions.

## 2016-05-10 NOTE — Progress Notes (Signed)
  [name], MD Phone: [phone]  [name]. is a 61 y.o. male who presents today for same-day visit.  Patient reports continued issues with left lower quadrant discomfort following treatment for diverticulitis. He was treated by an urgent care with ciprofloxacin and Flagyl and then followed up here and was improving at that time. He notes persistent mild discomfort in the left lower quadrant and a sensation of fullness in this area. He notes he is penciling his stools as well. He notes no blood in his stool. No diarrhea or vomiting. No fevers. No history of colon cancer. He did have polyps on his most recent colonoscopy about 4 years ago per his report. He's been using Senokot and Metamucil with little benefit.  ROS see history of present illness  Objective  Physical Exam Vitals:   05/10/16 0930  BP: 112/80  Pulse: 83  Temp: 98.1 F (36.7 C)    BP Readings from Last 3 Encounters:  05/10/16 112/80  05/02/16 120/84  03/30/16 140/86   Wt Readings from Last 3 Encounters:  05/10/16 200 lb 12.8 oz (91.1 kg)  05/02/16 200 lb 12.8 oz (91.1 kg)  03/30/16 199 lb (90.3 kg)    Physical Exam  Constitutional: No distress.  Cardiovascular: Normal rate, regular rhythm and normal heart sounds.   Pulmonary/Chest: Effort normal and breath sounds normal.  Abdominal: Soft. Bowel sounds are normal. He exhibits no distension and no mass. There is tenderness (mild left lower quadrant soreness). There is no rebound and no guarding.  Neurological: He is alert.  Skin: Skin is warm and dry. He is not diaphoretic.     Assessment/Plan: Please see individual problem list.  Diverticulitis Patient previously diagnosed with diverticulitis on clinical basis. Had been improving when last evaluated though has stabilized with mild discomfort in his left lower quadrant. Mild tenderness today in the left lower quadrant. Does note some constipation and penciling of stools as well. Given his  tenderness and stool penciling we will obtain a CT scan abdomen and pelvis to evaluate this area. He will also be referred to GI. We will determine if we should treat his constipation after his CT scan. Given return precautions.   Orders Placed This Encounter  Procedures  . CT Abdomen Pelvis W Contrast    Standing Status:   Future    Standing Expiration Date:   08/09/2017    Order Specific Question:   If indicated for the ordered procedure, I authorize the administration of contrast media per Radiology protocol    Answer:   Yes    Order Specific Question:   Reason for Exam (SYMPTOM  OR DIAGNOSIS REQUIRED)    Answer:   left lower quadrant abdominal discomfort persisting following empiric treatment for diverticulitis, penciling of stools    Order Specific Question:   Preferred imaging location?    Answer:   [facility]    Order Specific Question:   Call Results- Best Contact Number?    Answer:   [phone]    Order Specific Question:   Radiology Contrast Protocol - do NOT remove file path    Answer:   [email or web address]  . Ambulatory referral to Gastroenterology    Referral Priority:   Routine    Referral Type:   Consultation    Referral Reason:   Specialty Services Required    Number of Visits Requested:   [place], MD [place]

## 2016-05-10 NOTE — Telephone Encounter (Signed)
Janie from [facility] called with stat CT Abdomen and Pelvis results. Provider notified and aware of results.

## 2016-05-11 ENCOUNTER — Encounter: Payer: Self-pay | Admitting: General Surgery

## 2016-05-11 ENCOUNTER — Telehealth: Payer: Self-pay | Admitting: Family Medicine

## 2016-05-11 NOTE — Telephone Encounter (Signed)
-----   Message from [name], MD sent at 05/10/2016  7:49 PM EDT ----- Regarding: RE: RX Thanks for taking care of this for me. I will have my CMA cancel the prescription to [facility]. Randall Hiss.    ----- Message ----- From: Colon Branch, MD Sent: 05/10/2016   5:19 PM To: [name], MD Subject: Jim Desanctis The answering service call, apparently lactulose was send to [facility], the patient requested a local prescription which I sent. Please consider cancel the Rx sent to [facility]. Thanks [place], on call for the group

## 2016-05-11 NOTE — Telephone Encounter (Signed)
rx cancelled

## 2016-05-11 NOTE — Telephone Encounter (Signed)
Pt called requesting u/s results. Pt was advised that they have not yet been reviewed. Thank you!  Call pt @ [phone]

## 2016-05-11 NOTE — Telephone Encounter (Signed)
The results of the ultrasound were unremarkable. We could consider CT scan of his neck to evaluate for a further cause. If he is willing I can place an order or he can monitor this.

## 2016-05-11 NOTE — Telephone Encounter (Signed)
Please advise 

## 2016-05-12 NOTE — Telephone Encounter (Signed)
Patient notified and does not want a ct at this time

## 2016-05-18 ENCOUNTER — Encounter: Payer: Self-pay | Admitting: General Surgery

## 2016-05-18 ENCOUNTER — Ambulatory Visit (INDEPENDENT_AMBULATORY_CARE_PROVIDER_SITE_OTHER): Payer: Medicare Other | Admitting: General Surgery

## 2016-05-18 VITALS — BP 122/70 | HR 59 | Resp 12 | Ht 70.0 in | Wt 198.0 lb

## 2016-05-18 DIAGNOSIS — K5900 Constipation, unspecified: Secondary | ICD-10-CM

## 2016-05-18 DIAGNOSIS — R9389 Abnormal findings on diagnostic imaging of other specified body structures: Secondary | ICD-10-CM

## 2016-05-18 DIAGNOSIS — R938 Abnormal findings on diagnostic imaging of other specified body structures: Secondary | ICD-10-CM | POA: Diagnosis not present

## 2016-05-18 LAB — POC HEMOCCULT BLD/STL (OFFICE/1-CARD/DIAGNOSTIC): Fecal Occult Blood, POC: NEGATIVE

## 2016-05-18 NOTE — Patient Instructions (Signed)
Return as needed

## 2016-05-18 NOTE — Progress Notes (Signed)
Patient ID: [name]., male   DOB: 1956/01/22, 61 y.o.   MRN: [id number]  Chief Complaint  Patient presents with  . Other    HPI [name]. is a 61 y.o. male here today for a evaluation of a left inguinal hernia. Patient states he had a ct scan done on 05/10/2016 and they found a left inguinal hernia. No pain . He had a spell of diverticulosis two weeks ago.  Moves his bowels daily and always has constipation. He has try stool softer and metamucil . Left inguinal hernia repair was done in 1994. HPI  Past Medical History:  Diagnosis Date  . Anxiety   . BPH (benign prostatic hyperplasia) 11/19/2013  . 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 Bulging disc    C2/3, C3/4, C6/7  . Cervical disc herniation    C4/5 and C5/6  . Colon polyp 04/18/2011  . Constipation 08/31/2012  . Depression   . Diverticulosis   . 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 GERD (gastroesophageal reflux disease)   . H/O diverticulitis of colon   . Hemorrhoid   . Hiatal hernia   . Hyperlipidemia   . Levoscoliosis   . Sleep difficulties   . Spinal stenosis in cervical region    cord abutment C4/5  . Testicular pain, left   . Typical atrial flutter ([name]) 11/2015   a. CHADS2VASc => 0; b. on Eliquis for pending ablation     Past Surgical History:  Procedure Laterality Date  . ATRIAL FLUTTER ABLATION  02/17/2016  . COLONOSCOPY  2014  . ELECTROPHYSIOLOGIC STUDY N/A 01/04/2016   Procedure: Cardioversion;  Surgeon: Tyler Hampshire, MD;  Location: [facility] OR[facility];  Service: Cardiovascular;  Laterality: N/A;  . ELECTROPHYSIOLOGIC STUDY N/A 02/17/2016   Procedure: A-Flutter Ablation;  Surgeon: Tyler Sprang, MD;  Location: [facility];  Service: Cardiovascular;  Laterality: N/A;  . GANGLION CYST EXCISION Right 1994   wrist & back  . HERNIA REPAIR Right 1994   abdominal repair with mesh  . NASAL SEPTUM SURGERY  2011   Dr. Richardson Ayala     Family History   Problem Relation Age of Onset  . Hyperlipidemia Mother   . Cancer Mother     skin cancer?  . Heart disease Mother 38    MI - died in her sleep  . Hyperlipidemia Father   . Heart disease Father   . Kidney disease Neg Hx   . Prostate cancer Neg Hx     Social History Social History  Substance Use Topics  . Smoking status: Former Smoker    Years: 20.00    Types: Cigarettes    Quit date: 02/08/1991  . Smokeless tobacco: Never Used  . Alcohol use No    Allergies  Allergen Reactions  . Other Other (See Comments)    Pollen:  Sinus infection, cough, fatigue    Current Outpatient Prescriptions  Medication Sig Dispense Refill  . ALPRAZolam (XANAX) 0.5 MG tablet TAKE 1 TABLET BY MOUTH EVERY NIGHT AT BEDTIME AS NEEDED FOR ANXIETY 20 tablet 0  . atorvastatin (LIPITOR) 10 MG tablet Take 1 tablet (10 mg total) by mouth every morning. 90 tablet 0  . cyclobenzaprine (FLEXERIL) 10 MG tablet TAKE 1 TABLET(10 MG) BY MOUTH THREE TIMES DAILY AS NEEDED FOR MUSCLE SPASMS 90 tablet 0  . escitalopram (LEXAPRO) 10 MG tablet Take 1 tablet (10 mg total) by mouth daily. [place]  tablet 2  . finasteride (PROSCAR) 5 MG tablet Take 1 tablet (5 mg total) by mouth daily. 90 tablet 3  . lactulose (CHRONULAC) 10 GM/15ML solution Take 15 mLs (10 g total) by mouth daily as needed for mild constipation. 240 mL 0  . meloxicam (MOBIC) 15 MG tablet Take 1 tablet (15 mg total) by mouth daily as needed for pain. 90 tablet 1  . mirabegron ER (MYRBETRIQ) 50 MG TB24 tablet Take 50 mg by mouth daily.    . pantoprazole (PROTONIX) 40 MG tablet TAKE 1 TABLET(40 MG) BY MOUTH DAILY 90 tablet 1  . Probiotic Product (ALIGN) 4 MG CAPS Take 1 capsule by mouth daily (Patient taking differently: Take 1 capsule by mouth daily as needed. For bowel regularity) 30 capsule 3  . psyllium (METAMUCIL) 58.6 % powder Take 1 packet by mouth 3 (three) times daily.    . tadalafil (CIALIS) 10 MG tablet Take 0.5 tablets (5 mg total) by mouth daily as  needed. 30 tablet 0  . triamcinolone (NASACORT) 55 MCG/ACT AERO nasal inhaler Place 2 sprays into the nose daily as needed. For allergies     No current facility-administered medications for this visit.     Review of Systems Review of Systems  Constitutional: Negative.   Respiratory: Negative.   Cardiovascular: Negative.   Gastrointestinal: Positive for constipation.    Blood pressure 122/70, pulse (!) 59, resp. rate 12, height 5\' 10"  (1.778 m), weight 198 lb (89.8 kg).  Physical Exam Physical Exam  Constitutional: He is oriented to person, place, and time. He appears well-developed.  Eyes: Conjunctivae are normal. No scleral icterus.  Neck: Neck supple.  Cardiovascular: Normal rate, regular rhythm and normal heart sounds.   Pulmonary/Chest: Effort normal and breath sounds normal.  Abdominal: Soft. Normal appearance and bowel sounds are normal. There is no hepatomegaly. There is no tenderness. No hernia.    Epidermoid cyst left groin    Lymphadenopathy:    He has no cervical adenopathy.       Right: No inguinal adenopathy present.       Left: No inguinal adenopathy present.  Neurological: He is alert and oriented to person, place, and time.  Skin: Skin is warm and dry.    Data Reviewed CT scan of the abdomen and pelvis completed 05/10/2016 based bar for left lower quadrant pain was reviewed. Diverticulosis without evidence of diverticulitis. Report of a left inguinal hernia with fat. Surgical changes of prior left inguinal hernia reported, but review of the films shows multiple tacks in the right inguinal area and none on the left. The patient's procedure was completed before absorbable tacks were available which makes me think that he previously had a right inguinal hernia repaired.  Assessment    Normal clinical exam.    Plan    The patient is symptom-free and has no clinical evidence of an inguinal hernia. No intervention is required for the CT findings of fat in the  inguinal canal.    Patient to return as needed.  HPI, Physical Exam, Assessment and Plan have been scribed under the direction and in the presence of J.W.Tyler Bascomb, MD  [name], CMA  I have completed the exam and reviewed the above documentation for accuracy and completeness.  I agree with the above.  Haematologist has been used and any errors in dictation or transcription are unintentional.  Hervey Ard, M.D., F.A.C.S.  Robert Bellow 05/19/2016, 4:12 PM

## 2016-05-19 DIAGNOSIS — K5909 Other constipation: Secondary | ICD-10-CM | POA: Insufficient documentation

## 2016-05-19 DIAGNOSIS — K5900 Constipation, unspecified: Secondary | ICD-10-CM | POA: Insufficient documentation

## 2016-05-19 DIAGNOSIS — R9389 Abnormal findings on diagnostic imaging of other specified body structures: Secondary | ICD-10-CM | POA: Insufficient documentation

## 2016-05-20 ENCOUNTER — Other Ambulatory Visit: Payer: Self-pay | Admitting: Family Medicine

## 2016-06-07 ENCOUNTER — Other Ambulatory Visit: Payer: Self-pay | Admitting: Urology

## 2016-06-07 DIAGNOSIS — R972 Elevated prostate specific antigen [PSA]: Secondary | ICD-10-CM

## 2016-06-08 ENCOUNTER — Other Ambulatory Visit: Payer: Self-pay | Admitting: Family Medicine

## 2016-06-08 NOTE — Telephone Encounter (Signed)
Last OV 05/10/16 last filled 02/19/16 90 rf 0

## 2016-06-11 ENCOUNTER — Other Ambulatory Visit: Payer: Self-pay | Admitting: Urology

## 2016-06-11 DIAGNOSIS — R972 Elevated prostate specific antigen [PSA]: Secondary | ICD-10-CM

## 2016-06-14 ENCOUNTER — Other Ambulatory Visit: Payer: Self-pay

## 2016-06-14 ENCOUNTER — Encounter: Payer: Self-pay | Admitting: Gastroenterology

## 2016-06-14 ENCOUNTER — Ambulatory Visit (INDEPENDENT_AMBULATORY_CARE_PROVIDER_SITE_OTHER): Payer: Medicare Other | Admitting: Gastroenterology

## 2016-06-14 VITALS — BP 98/65 | HR 92 | Temp 97.9°F | Ht 70.0 in | Wt 197.0 lb

## 2016-06-14 DIAGNOSIS — K5900 Constipation, unspecified: Secondary | ICD-10-CM

## 2016-06-14 DIAGNOSIS — R1032 Left lower quadrant pain: Secondary | ICD-10-CM

## 2016-06-14 MED ORDER — PLECANATIDE 3 MG PO TABS: 3.0000 mg | ORAL_TABLET | Freq: Every day | ORAL | Status: DC

## 2016-06-14 NOTE — Progress Notes (Signed)
Gastroenterology Consultation  Referring Provider:     [name], MD Primary Care Physician:  [name], MD Primary Gastroenterologist:  Dr. Allen Norris     Reason for Consultation:    Left lower quadrant pain and constipation        HPI:   Tyler Caudill. is a 61 y.o. y/o male referred for consultation & management of Left lower quadrant pain by Dr. Caryl Bis, Angela Adam, MD.  This patient comes in after being clinically diagnosed by his PCP with diverticulitis. The patient then was sent for CT scan that showed a thickening of the sigmoid colon with diffuse diverticulosis without any acute diverticulitis. The patient also reports that his stools have changed in caliber with pencil like stools at the present time. The CT scan did show some hydronephrosis but did not show any acute processes in the colon or masses. The patient does report constipation. The patient reports the left lower quadrant discomfort to be mild in nature. The patient's last colonoscopy was 4 years ago and he has a history of polyps.  The patient's previous colonoscopy was in 2009.  He reports that the abdominal pain has improved greatly. The patient reports that he has to take multiple medications  Past Medical History:  Diagnosis Date  . Anxiety   . BPH (benign prostatic hyperplasia) 11/19/2013  . 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 Bulging disc    C2/3, C3/4, C6/7  . Cervical disc herniation    C4/5 and C5/6  . Colon polyp 04/18/2011  . Constipation 08/31/2012  . Depression   . Diverticulosis   . 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 GERD (gastroesophageal reflux disease)   . H/O diverticulitis of colon   . Hemorrhoid   . Hiatal hernia   . Hyperlipidemia   . Levoscoliosis   . Sleep difficulties   . Spinal stenosis in cervical region    cord abutment C4/5  . Testicular pain, left   . Typical atrial flutter ([place]) 11/2015   a. CHADS2VASc => 0; b. on  Eliquis for pending ablation     Past Surgical History:  Procedure Laterality Date  . ATRIAL FLUTTER ABLATION  02/17/2016  . COLONOSCOPY  2014  . ELECTROPHYSIOLOGIC STUDY N/A 01/04/2016   Procedure: Cardioversion;  Surgeon: Wellington Hampshire, MD;  Location: [facility];  Service: Cardiovascular;  Laterality: N/A;  . ELECTROPHYSIOLOGIC STUDY N/A 02/17/2016   Procedure: A-Flutter Ablation;  Surgeon: Deboraha Sprang, MD;  Location: [facility];  Service: Cardiovascular;  Laterality: N/A;  . GANGLION CYST EXCISION Right 1994   wrist & back  . HERNIA REPAIR Right 1994   abdominal repair with mesh  . NASAL SEPTUM SURGERY  2011   Dr. [name]     Prior to Admission medications   Medication Sig Start Date End Date Taking? Authorizing Provider  ALPRAZolam Duanne Moron) 0.5 MG tablet TAKE 1 TABLET BY MOUTH EVERY NIGHT AT BEDTIME AS NEEDED FOR ANXIETY 03/30/16   [name], MD  atorvastatin (LIPITOR) 10 MG tablet TAKE 1 TABLET(10 MG) BY MOUTH EVERY MORNING 05/20/16   [name], MD  cyclobenzaprine (FLEXERIL) 10 MG tablet TAKE 1 TABLET(10 MG) BY MOUTH THREE TIMES DAILY AS NEEDED FOR MUSCLE SPASMS 06/08/16   [name], MD  escitalopram (LEXAPRO) 10 MG tablet Take 1 tablet (10 mg total) by mouth daily. 03/30/16   [name], MD  finasteride (  PROSCAR) 5 MG tablet TAKE 1 TABLET BY MOUTH DAILY 06/12/16   Ernestine Conrad, Larene Beach A, [name]  lactulose (CHRONULAC) 10 GM/15ML solution Take 15 mLs (10 g total) by mouth daily as needed for mild constipation. 05/10/16   Colon Branch, MD  meloxicam (MOBIC) 15 MG tablet Take 1 tablet (15 mg total) by mouth daily as needed for pain. 02/19/16   [name], MD  mirabegron ER (MYRBETRIQ) 50 MG TB24 tablet Take 50 mg by mouth daily.    [provider]  pantoprazole (PROTONIX) 40 MG tablet TAKE 1 TABLET(40 MG) BY MOUTH DAILY 03/28/16   [name], MD  Probiotic Product (ALIGN) 4 MG CAPS Take 1 capsule by mouth daily Patient taking  differently: Take 1 capsule by mouth daily as needed. For bowel regularity 06/12/15   [name], MD  psyllium (METAMUCIL) 58.6 % powder Take 1 packet by mouth 3 (three) times daily.    [provider]  tadalafil (CIALIS) 10 MG tablet Take 0.5 tablets (5 mg total) by mouth daily as needed. 04/19/16   [name], MD  triamcinolone (NASACORT) 55 MCG/ACT AERO nasal inhaler Place 2 sprays into the nose daily as needed. For allergies    [provider]    Family History  Problem Relation Age of Onset  . Hyperlipidemia Mother   . Cancer Mother     skin cancer?  . Heart disease Mother 58    MI - died in her sleep  . Hyperlipidemia Father   . Heart disease Father   . Kidney disease Neg Hx   . Prostate cancer Neg Hx      Social History  Substance Use Topics  . Smoking status: Former Smoker    Years: 20.00    Types: Cigarettes    Quit date: 02/08/1991  . Smokeless tobacco: Never Used  . Alcohol use No    Allergies as of 06/14/2016 - Review Complete 05/18/2016  Allergen Reaction Noted  . Other Other (See Comments) 04/23/2013    Review of Systems:    All systems reviewed and negative except where noted in HPI.   Physical Exam:  There were no vitals taken for this visit. No LMP for male patient. Psych:  Alert and cooperative. Normal mood and affect. General:   Alert,  Well-developed, well-nourished, pleasant and cooperative in NAD Head:  Normocephalic and atraumatic. Eyes:  Sclera clear, no icterus.   Conjunctiva pink. Ears:  Normal auditory acuity. Nose:  No deformity, discharge, or lesions. Mouth:  No deformity or lesions,oropharynx pink & moist. Neck:  Supple; no masses or thyromegaly. Lungs:  Respirations even and unlabored.  Clear throughout to auscultation.   No wheezes, crackles, or rhonchi. No acute distress. Heart:  Regular rate and rhythm; no murmurs, clicks, rubs, or gallops. Abdomen:  Normal bowel sounds.  No bruits.  Soft, non-tender and  non-distended without masses, hepatosplenomegaly or hernias noted.  No guarding or rebound tenderness.  Negative Carnett sign.   Rectal:  Deferred.  Msk:  Symmetrical without gross deformities.  Good, equal movement & strength bilaterally. Pulses:  Normal pulses noted. Extremities:  No clubbing or edema.  No cyanosis. Neurologic:  Alert and oriented x3;  grossly normal neurologically. Skin:  Intact without significant lesions or rashes.  No jaundice. Lymph Nodes:  No significant cervical adenopathy. Psych:  Alert and cooperative. Normal mood and affect.  Imaging Studies: No results found.  Assessment and Plan:   [name]. is a 61 y.o.  y/o male With a history of diverticulosis and colon polyps.  The patient reports that he takes laxatives quite frequently to help move his bowels.  The patient has been given samples of [place].  The patient has been told to take this to see if he can avoid taking his multiple stool softeners every day. The patient will also be set up for colonoscopy due to the change in the caliber of his stools with a history of polyps and his left lower quadrant pain. I have discussed risks & benefits which include, but are not limited to, bleeding, infection, perforation & drug reaction.  The patient agrees with this plan & written consent will be obtained.       Lucilla Lame, MD. Marval Regal   Note: This dictation was prepared with Dragon dictation along with smaller phrase technology. Any transcriptional errors that result from this process are unintentional.

## 2016-06-14 NOTE — Patient Instructions (Signed)
You were given 3 boxes of Trulance samples today. If this medication works well for you, please call the office for a prescription.  You have been scheduled for a Colonoscopy at [facility] on 06/30/16. You were given instructions today for this procedure. If you have any questions regarding this, please call our office.

## 2016-06-29 NOTE — Discharge Instructions (Signed)

## 2016-06-30 ENCOUNTER — Ambulatory Visit: Payer: Medicare Other | Admitting: Anesthesiology

## 2016-06-30 ENCOUNTER — Encounter
Admission: RE | Disposition: A | Discharge status: Home / Self Care | Payer: Self-pay | Source: Ambulatory Visit | Attending: Gastroenterology

## 2016-06-30 ENCOUNTER — Ambulatory Visit
Admission: RE | Admit: 2016-06-30 | Discharge: 2016-06-30 | Disposition: A | Discharge status: Home / Self Care | Payer: Medicare Other | Source: Ambulatory Visit | Attending: Gastroenterology | Admitting: Gastroenterology

## 2016-06-30 DIAGNOSIS — I10 Essential (primary) hypertension: Secondary | ICD-10-CM | POA: Insufficient documentation

## 2016-06-30 DIAGNOSIS — F419 Anxiety disorder, unspecified: Secondary | ICD-10-CM | POA: Diagnosis not present

## 2016-06-30 DIAGNOSIS — K5730 Diverticulosis of large intestine without perforation or abscess without bleeding: Secondary | ICD-10-CM | POA: Insufficient documentation

## 2016-06-30 DIAGNOSIS — F329 Major depressive disorder, single episode, unspecified: Secondary | ICD-10-CM | POA: Diagnosis not present

## 2016-06-30 DIAGNOSIS — K641 Second degree hemorrhoids: Secondary | ICD-10-CM | POA: Diagnosis not present

## 2016-06-30 DIAGNOSIS — Z87891 Personal history of nicotine dependence: Secondary | ICD-10-CM | POA: Diagnosis not present

## 2016-06-30 DIAGNOSIS — N529 Male erectile dysfunction, unspecified: Secondary | ICD-10-CM | POA: Insufficient documentation

## 2016-06-30 DIAGNOSIS — Z79899 Other long term (current) drug therapy: Secondary | ICD-10-CM | POA: Insufficient documentation

## 2016-06-30 DIAGNOSIS — E785 Hyperlipidemia, unspecified: Secondary | ICD-10-CM | POA: Diagnosis not present

## 2016-06-30 DIAGNOSIS — K5900 Constipation, unspecified: Secondary | ICD-10-CM | POA: Insufficient documentation

## 2016-06-30 DIAGNOSIS — K219 Gastro-esophageal reflux disease without esophagitis: Secondary | ICD-10-CM | POA: Insufficient documentation

## 2016-06-30 DIAGNOSIS — R195 Other fecal abnormalities: Secondary | ICD-10-CM | POA: Diagnosis not present

## 2016-06-30 HISTORY — DX: Unspecified osteoarthritis, unspecified site: M19.90

## 2016-06-30 HISTORY — PX: COLONOSCOPY WITH PROPOFOL: SHX5780

## 2016-06-30 SURGERY — COLONOSCOPY WITH PROPOFOL
Anesthesia: Monitor Anesthesia Care | Wound class: Contaminated

## 2016-06-30 MED ORDER — STERILE WATER FOR IRRIGATION IR SOLN
Status: DC | PRN
  Administered 2016-06-30: 09:00:00

## 2016-06-30 MED ORDER — LACTATED RINGERS IV SOLN
10.0000 mL/h | INTRAVENOUS | Status: DC
  Administered 2016-06-30: 10 mL/h via INTRAVENOUS

## 2016-06-30 MED ORDER — PROPOFOL 10 MG/ML IV BOLUS
INTRAVENOUS | Status: DC | PRN
  Administered 2016-06-30 (×4): 50 mg via INTRAVENOUS
  Administered 2016-06-30: 100 mg via INTRAVENOUS
  Administered 2016-06-30: 50 mg via INTRAVENOUS

## 2016-06-30 MED ORDER — LIDOCAINE HCL (CARDIAC) 20 MG/ML IV SOLN
INTRAVENOUS | Status: DC | PRN
  Administered 2016-06-30: 50 mg via INTRAVENOUS

## 2016-06-30 MED ORDER — PLECANATIDE 3 MG PO TABS: 1.0000 | ORAL_TABLET | Freq: Once | ORAL | 5 refills | Status: AC

## 2016-06-30 SURGICAL SUPPLY — 23 items

## 2016-06-30 NOTE — Transfer of Care (Signed)
Immediate Anesthesia Transfer of Care Note  Patient: [name].  Procedure(s) Performed: Procedure(s): COLONOSCOPY WITH PROPOFOL (N/A)  Patient Location: PACU  Anesthesia Type: MAC  Level of Consciousness: awake, alert  and patient cooperative  Airway and Oxygen Therapy: Patient Spontanous Breathing and Patient connected to supplemental oxygen  Post-op Assessment: Post-op Vital signs reviewed, Patient's Cardiovascular Status Stable, Respiratory Function Stable, Patent Airway and No signs of Nausea or vomiting  Post-op Vital Signs: Reviewed and stable  Complications: No apparent anesthesia complications

## 2016-06-30 NOTE — Anesthesia Postprocedure Evaluation (Signed)
Anesthesia Post Note  Patient: [name].  Procedure(s) Performed: Procedure(s) (LRB): COLONOSCOPY WITH PROPOFOL (N/A)  Patient location during evaluation: PACU Anesthesia Type: MAC Level of consciousness: awake and alert Pain management: pain level controlled Vital Signs Assessment: post-procedure vital signs reviewed and stable Respiratory status: spontaneous breathing, nonlabored ventilation and respiratory function stable Cardiovascular status: stable Postop Assessment: no signs of nausea or vomiting Anesthetic complications: no    Veda Canning

## 2016-06-30 NOTE — Op Note (Signed)
[facility] Patient Name: [name] Procedure Date: 06/30/2016 8:42 AM MRN: [id number] Account #: [id number] Date of Birth: [birth year] Admit Type: Outpatient Age: 61 Room: [facility] OR ROOM 01 Gender: Male Note Status: Finalized Procedure:            Colonoscopy Indications:          Change in stool caliber Providers:            Lucilla Lame MD, MD Referring MD:         Angela Adam. Caryl Bis (Referring MD) Medicines:            Propofol per Anesthesia Complications:        No immediate complications. Procedure:            Pre-Anesthesia Assessment:                       - Prior to the procedure, a History and Physical was                        performed, and patient medications and allergies were                        reviewed. The patient's tolerance of previous                        anesthesia was also reviewed. The risks and benefits of                        the procedure and the sedation options and risks were                        discussed with the patient. All questions were                        answered, and informed consent was obtained. Prior                        Anticoagulants: The patient has taken no previous                        anticoagulant or antiplatelet agents. ASA Grade                        Assessment: II - A patient with mild systemic disease.                        After reviewing the risks and benefits, the patient was                        deemed in satisfactory condition to undergo the                        procedure.                       After obtaining informed consent, the colonoscope was                        passed under direct vision. Throughout the procedure,  the patient's blood pressure, pulse, and oxygen                        saturations were monitored continuously. The Olympus CF                        H180AL Colonoscope (S#: [id number]) was introduced through                         the anus and advanced to the the cecum, identified by                        appendiceal orifice and ileocecal valve. The                        colonoscopy was performed without difficulty. The                        patient tolerated the procedure well. The quality of                        the bowel preparation was excellent. Findings:      The perianal and digital rectal examinations were normal.      Non-bleeding internal hemorrhoids were found during retroflexion. The       hemorrhoids were Grade II (internal hemorrhoids that prolapse but reduce       spontaneously).      Multiple small-mouthed diverticula were found in the sigmoid colon. Impression:           - Non-bleeding internal hemorrhoids.                       - Diverticulosis in the sigmoid colon.                       - No specimens collected. Recommendation:       - Discharge patient to home.                       - Resume previous diet.                       - Repeat colonoscopy in 10 years for screening unless                        any change in family history or lower GI problems. Procedure Code(s):    --- Professional ---                       [phone], Colonoscopy, flexible; diagnostic, including                        collection of specimen(s) by brushing or washing, when                        performed (separate procedure) Diagnosis Code(s):    --- Professional ---                       R19.5, Other fecal abnormalities CPT copyright 2016 American Medical Association. All rights reserved. The codes documented in this report are preliminary and upon coder review may  be revised to meet current compliance requirements. Lucilla Lame MD, MD 06/30/2016 9:10:03 AM This report has been signed electronically. Number of Addenda: 0 Note Initiated On: 06/30/2016 8:42 AM Scope Withdrawal Time: 0 hours 8 minutes 25 seconds  Total Procedure Duration: 0 hours 13 minutes 15 seconds       [facility]

## 2016-06-30 NOTE — H&P (Signed)
Lucilla Lame, MD [facility] [address] [place], [place] [address] [phone] Fax : [phone]  Primary Care Physician:  [name], MD Primary Gastroenterologist:  Dr. Allen Norris  Pre-Procedure History & Physical: HPI:  [name]. is a 61 y.o. male is here for an colonoscopy.   Past Medical History:  Diagnosis Date  . Anxiety   . Arthritis   . BPH (benign prostatic hyperplasia) 11/19/2013  . 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 Bulging disc    C2/3, C3/4, C6/7  . Cervical disc herniation    C4/5 and C5/6  . Colon polyp 04/18/2011  . Constipation 08/31/2012  . Depression   . Diverticulosis   . 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 GERD (gastroesophageal reflux disease)   . H/O diverticulitis of colon   . Hemorrhoid   . Hiatal hernia   . Hyperlipidemia   . Levoscoliosis   . Sleep difficulties   . Spinal stenosis in cervical region    cord abutment C4/5  . Testicular pain, left   . Typical atrial flutter ([place]) 11/2015   a. CHADS2VASc => 0; b. on Eliquis for pending ablation     Past Surgical History:  Procedure Laterality Date  . ATRIAL FLUTTER ABLATION  02/17/2016  . COLONOSCOPY  2014  . ELECTROPHYSIOLOGIC STUDY N/A 01/04/2016   Procedure: Cardioversion;  Surgeon: Wellington Hampshire, MD;  Location: [facility] ORS;  Service: Cardiovascular;  Laterality: N/A;  . ELECTROPHYSIOLOGIC STUDY N/A 02/17/2016   Procedure: A-Flutter Ablation;  Surgeon: Deboraha Sprang, MD;  Location: [facility];  Service: Cardiovascular;  Laterality: N/A;  . GANGLION CYST EXCISION Right 1994   wrist & back  . HERNIA REPAIR Right 1994   abdominal repair with mesh  . NASAL SEPTUM SURGERY  2011   Dr. [name]     Prior to Admission medications   Medication Sig Start Date End Date Taking? Authorizing Provider  ALPRAZolam Duanne Moron) 0.5 MG tablet TAKE 1 TABLET BY MOUTH EVERY NIGHT AT BEDTIME AS NEEDED FOR ANXIETY 03/30/16  Yes  [name], MD  atorvastatin (LIPITOR) 10 MG tablet TAKE 1 TABLET(10 MG) BY MOUTH EVERY MORNING 05/20/16  Yes [name], MD  cyclobenzaprine (FLEXERIL) 10 MG tablet TAKE 1 TABLET(10 MG) BY MOUTH THREE TIMES DAILY AS NEEDED FOR MUSCLE SPASMS 06/08/16  Yes [name], MD  escitalopram (LEXAPRO) 10 MG tablet Take 1 tablet (10 mg total) by mouth daily. 03/30/16  Yes [name], MD  finasteride (PROSCAR) 5 MG tablet TAKE 1 TABLET BY MOUTH DAILY 06/12/16  Yes McGowan, Larene Beach A, [name]  meloxicam (MOBIC) 15 MG tablet Take 1 tablet (15 mg total) by mouth daily as needed for pain. 02/19/16  Yes [name], MD  mirabegron ER (MYRBETRIQ) 50 MG TB24 tablet Take 50 mg by mouth daily.   Yes [provider]  pantoprazole (PROTONIX) 40 MG tablet TAKE 1 TABLET(40 MG) BY MOUTH DAILY 03/28/16  Yes [name], MD  Plecanatide (TRULANCE) 3 MG TABS Take 3 mg by mouth daily. 3 boxes of samples given Lot# [id number] Exp:5/19 06/14/16  Yes Lucilla Lame, MD  Probiotic Product (ALIGN) 4 MG CAPS Take 1 capsule by mouth daily Patient taking differently: Take 1 capsule by mouth daily as needed. For bowel regularity 06/12/15  Yes [name], MD  psyllium (METAMUCIL) 58.6 % powder Take 1 packet by mouth 3 (three) times daily.  Yes [provider]  tadalafil (CIALIS) 10 MG tablet Take 0.5 tablets (5 mg total) by mouth daily as needed. 04/19/16  Yes [name], MD  triamcinolone (NASACORT) 55 MCG/ACT AERO nasal inhaler Place 2 sprays into the nose daily as needed. For allergies   Yes [provider]  lactulose (CHRONULAC) 10 GM/15ML solution Take 15 mLs (10 g total) by mouth daily as needed for mild constipation. Patient not taking: Reported on 06/30/2016 05/10/16   Colon Branch, MD    Allergies as of 06/14/2016 - Review Complete 06/14/2016  Allergen Reaction Noted  . Other Other (See Comments) 04/23/2013    Family History  Problem Relation Age of Onset    . Hyperlipidemia Mother   . Cancer Mother        skin cancer?  . Heart disease Mother 79       MI - died in her sleep  . Hyperlipidemia Father   . Heart disease Father   . Kidney disease Neg Hx   . Prostate cancer Neg Hx     Social History   Social History  . Marital status: Married    Spouse name: [name]  . Number of children: 1  . Years of education: GED   Occupational History  . Disability     Herniated disk, spinal stenosis   Social History Main Topics  . Smoking status: Former Smoker    Years: 20.00    Types: Cigarettes    Quit date: 02/08/1991  . Smokeless tobacco: Never Used  . Alcohol use No  . Drug use: No  . Sexual activity: Yes    Partners: Female   Other Topics Concern  . Not on file   Social History Narrative   Wyland was born and raised in [place], [place]. He quit school after the 8th grade because of financial hardship. He was working since age 47. He went back and got his GED. He moved to [place] in 2006. He lives at home with his wife of 35 years and their 10 year old daughter. Previously, Bryler was married to his first wife for 20 years and they divorced. They had no children.  Gracin has been disabled 2/2 back problems.      Review of Systems: See HPI, otherwise negative ROS  Physical Exam: BP 133/79   Pulse 73   Temp 98.1 F (36.7 C) (Temporal)   Resp 16   Ht 5\' 10"  (1.778 m)   Wt 191 lb (86.6 kg)   SpO2 99%   BMI 27.41 kg/m  General:   Alert,  pleasant and cooperative in NAD Head:  Normocephalic and atraumatic. Neck:  Supple; no masses or thyromegaly. Lungs:  Clear throughout to auscultation.    Heart:  Regular rate and rhythm. Abdomen:  Soft, nontender and nondistended. Normal bowel sounds, without guarding, and without rebound.   Neurologic:  Alert and  oriented x4;  grossly normal neurologically.  Impression/Plan: [name] is here for an colonoscopy to be performed for constipation  Risks, benefits, limitations, and  alternatives regarding  colonoscopy have been reviewed with the patient.  Questions have been answered.  All parties agreeable.   Lucilla Lame, MD  06/30/2016, 8:42 AM

## 2016-06-30 NOTE — Anesthesia Preprocedure Evaluation (Addendum)
Anesthesia Evaluation  Patient identified by MRN, date of birth, ID band Patient awake    Reviewed: Allergy & Precautions, NPO status   History of Anesthesia Complications Negative for: history of anesthetic complications  Airway Mallampati: II  TM Distance: >3 FB     Dental  (+) Teeth Intact, Dental Advisory Given   Pulmonary former smoker,    breath sounds clear to auscultation- rhonchi (-) wheezing      Cardiovascular Exercise Tolerance: Good hypertension, + dysrhythmias (s/p ablation - in sinus rhythm) Atrial Fibrillation  Rhythm:Regular Rate:Normal - Systolic murmurs and - Diastolic murmurs    Neuro/Psych PSYCHIATRIC DISORDERS Anxiety negative neurological ROS     GI/Hepatic hiatal hernia, GERD  ,  Endo/Other  negative endocrine ROS  Renal/GU      Musculoskeletal  (+) Arthritis ,   Abdominal (+) - obese,   Peds  Hematology   Anesthesia Other Findings   Reproductive/Obstetrics                            Anesthesia Physical  Anesthesia Plan  ASA: II  Anesthesia Plan: MAC   Post-op Pain Management:    Induction: Intravenous  Airway Management Planned: Natural Airway  Additional Equipment:   Intra-op Plan:   Post-operative Plan:   Informed Consent: I have reviewed the patients History and Physical, chart, labs and discussed the procedure including the risks, benefits and alternatives for the proposed anesthesia with the patient or authorized representative who has indicated his/her understanding and acceptance.     Plan Discussed with: CRNA  Anesthesia Plan Comments:         Anesthesia Quick Evaluation

## 2016-06-30 NOTE — Anesthesia Procedure Notes (Signed)
Procedure Name: MAC Performed by: Lavoy Bernards Pre-anesthesia Checklist: Patient identified, Emergency Drugs available, Suction available, Timeout performed and Patient being monitored Patient Re-evaluated:Patient Re-evaluated prior to inductionOxygen Delivery Method: Nasal cannula Placement Confirmation: positive ETCO2     

## 2016-07-01 ENCOUNTER — Other Ambulatory Visit: Payer: Self-pay

## 2016-07-01 MED ORDER — PLECANATIDE 3 MG PO TABS: 3.0000 mg | ORAL_TABLET | Freq: Every day | ORAL | 6 refills | Status: DC

## 2016-07-07 ENCOUNTER — Other Ambulatory Visit: Payer: Self-pay | Admitting: Family Medicine

## 2016-07-07 ENCOUNTER — Other Ambulatory Visit: Payer: Self-pay | Admitting: Urology

## 2016-07-07 DIAGNOSIS — R972 Elevated prostate specific antigen [PSA]: Secondary | ICD-10-CM

## 2016-07-07 NOTE — Telephone Encounter (Signed)
Pt called needing a refill for medication of tadalafil (CIALIS) 10 MG tablet.  Pharmacy is [id number] - [place] [address]  Call pt @ [phone]. Thank you!

## 2016-07-08 MED ORDER — TADALAFIL 10 MG PO TABS: 5.0000 mg | ORAL_TABLET | Freq: Every day | ORAL | 0 refills | Status: DC | PRN

## 2016-07-08 NOTE — Telephone Encounter (Signed)
Last OV 0/03/18 last filled 04/19/16 30 0rf

## 2016-07-08 NOTE — Addendum Note (Signed)
Addendum  created 07/08/16 0901 by Rica Koyanagi, MD   Sign clinical note

## 2016-07-08 NOTE — Addendum Note (Signed)
Addendum  created 07/08/16 0909 by Rica Koyanagi, MD   Sign clinical note

## 2016-07-12 ENCOUNTER — Telehealth: Payer: Self-pay | Admitting: Family Medicine

## 2016-07-12 NOTE — Telephone Encounter (Signed)
Spoke to pt. He will call back to schedule AWV

## 2016-08-01 ENCOUNTER — Telehealth: Payer: Self-pay | Admitting: Urology

## 2016-08-01 NOTE — Telephone Encounter (Signed)
Spoke with patient and let him know that Myrbetriq 50mg  up front for pick up per [facility] and that next time we may need to send in an RX for medication. Patient understands.

## 2016-08-01 NOTE — Telephone Encounter (Signed)
Pt called office stating that he's been told to call before he comes to office to pick up samples of Myrbetriq 50mg . He is requesting more samples. Please advise. Thank you.

## 2016-08-09 ENCOUNTER — Telehealth: Payer: Self-pay | Admitting: Family Medicine

## 2016-08-09 NOTE — Telephone Encounter (Signed)
Please advise 

## 2016-08-09 NOTE — Telephone Encounter (Signed)
Patient notified

## 2016-08-09 NOTE — Telephone Encounter (Signed)
Pt called and would like to have labs done. Order needed for lab work. Please and thank you!

## 2016-08-09 NOTE — Telephone Encounter (Signed)
Patient is not due for any labs at this time. Please check to see what labs he thought he needs. Thanks.

## 2016-08-12 ENCOUNTER — Encounter: Payer: Self-pay | Admitting: Family Medicine

## 2016-08-12 ENCOUNTER — Ambulatory Visit (INDEPENDENT_AMBULATORY_CARE_PROVIDER_SITE_OTHER): Payer: Medicare Other | Admitting: Family Medicine

## 2016-08-12 VITALS — BP 126/78 | HR 83 | Temp 98.1°F | Ht 70.0 in | Wt 195.1 lb

## 2016-08-12 DIAGNOSIS — R221 Localized swelling, mass and lump, neck: Secondary | ICD-10-CM

## 2016-08-12 DIAGNOSIS — R05 Cough: Secondary | ICD-10-CM

## 2016-08-12 DIAGNOSIS — F4323 Adjustment disorder with mixed anxiety and depressed mood: Secondary | ICD-10-CM | POA: Diagnosis not present

## 2016-08-12 DIAGNOSIS — R053 Chronic cough: Secondary | ICD-10-CM | POA: Insufficient documentation

## 2016-08-12 MED ORDER — ALPRAZOLAM 0.5 MG PO TABS: ORAL_TABLET | ORAL | 0 refills | Status: DC

## 2016-08-12 NOTE — Progress Notes (Signed)
Tyler Rumps, MD Phone: [phone]  [name]. is a 61 y.o. male who presents today for same-day visit.  Patient notes chronic cough over the last several years. Previously evaluated by his prior PCP and had a chest x-ray at that time. Looks like he was evaluated by pulmonology at that time and it was felt to be secondary to GERD and possibly air swallowing. Symptoms have worsened to some degree over the last several weeks to months. He notes no wheezing. No shortness of breath. No postnasal drip. Does have intermittent reflux symptoms though only if he forgets to take his medications. No TB exposure. No fevers. No night sweats. No weight loss. Does note occasionally having to sit up in bed to take a deep breath.  Patient continues to note the area of swelling in his left lateral neck that we'll get larger and then proceed. Occasionally is uncomfortable. Ultrasound previously was unremarkable.  Does note he rarely takes Xanax to help him sleep. Most the time he sleeps well though occasionally he'll feel anxious and has to take the Xanax. It does help him sleep. He does not feel drowsy the next day.  PMH: Former smoker   ROS see history of present illness  Objective  Physical Exam Vitals:   08/12/16 0826  BP: 126/78  Pulse: 83  Temp: 98.1 F (36.7 C)    BP Readings from Last 3 Encounters:  08/12/16 126/78  06/30/16 129/79  06/14/16 98/65   Wt Readings from Last 3 Encounters:  08/12/16 195 lb 1.9 oz (88.5 kg)  06/30/16 191 lb (86.6 kg)  06/14/16 197 lb (89.4 kg)    Physical Exam  Constitutional: No distress.  HENT:  Head: Normocephalic and atraumatic.  Mild oropharyngeal erythema, no exudate  Eyes: Conjunctivae are normal. Pupils are equal, round, and reactive to light.  Neck: Neck supple.    Cardiovascular: Normal rate, regular rhythm and normal heart sounds.   Pulmonary/Chest: Effort normal and breath sounds normal.  Musculoskeletal: He exhibits no edema.   Lymphadenopathy:    He has no cervical adenopathy.  Neurological: He is alert. Gait normal.  Skin: Skin is warm and dry. He is not diaphoretic.     Assessment/Plan: Please see individual problem list.  Chronic cough Patient has had intermittent cough for a number of years. Prior evaluation revealed it to possibly be related to reflux or air swallowing. He has a benign exam today. Most recent chest x-ray with some hyperinflation. We will refer to pulmonology. We will have him take his reflux medication twice daily for the next 2 weeks and see if that benefits him. He can try over-the-counter Claritin as well.  Neck swelling Continues to have intermittent swelling in his left neck. Prior ultrasound did not reveal any cause. Does note some discomfort today on exam. Given persistence we will proceed with CT scan of his neck to evaluate for any abnormalities that could cause this.  Adjustment disorder with mixed anxiety and depressed mood Continue as needed Xanax. Refill given.   Orders Placed This Encounter  Procedures  . CT Soft Tissue Neck W Contrast    Standing Status:   Future    Standing Expiration Date:   11/12/2017    Order Specific Question:   If indicated for the ordered procedure, I authorize the administration of contrast media per Radiology protocol    Answer:   Yes    Order Specific Question:   Reason for Exam (SYMPTOM  OR DIAGNOSIS REQUIRED)  Answer:   persistent left anterolateral neck puffiness in to upper supraclavicular region, no palpable abnormalities noted, negative US of the area previously    Order Specific Question:   Preferred imaging location?    Answer:   [facility]    Order Specific Question:   Radiology Contrast Protocol - do NOT remove file path    Answer:   [email or web address]  . Ambulatory referral to Pulmonology    Referral Priority:   Routine    Referral Type:   Consultation    Referral Reason:   Specialty Services  Required    Requested Specialty:   Pulmonary Disease    Number of Visits Requested:   1    Meds ordered this encounter  Medications  . ALPRAZolam (XANAX) 0.5 MG tablet    Sig: TAKE 1 TABLET BY MOUTH EVERY NIGHT AT BEDTIME AS NEEDED FOR ANXIETY    Dispense:  20 tablet    Refill:  0   Tyler Rumps, MD [place]

## 2016-08-12 NOTE — Patient Instructions (Addendum)
Nice to see you. We are going to have you see pulmonology for evaluation of your chronic cough and you may need lung function testing given your history of smoking. Please try taking Protonix 40 mg 30 minutes prior to breakfast and dinner. You can do this for 2 weeks. You could also try adding over-the-counter Claritin to see if that would be beneficial. If you develop shortness of breath, cough productive of blood, night sweats, fevers, or any new or changing symptoms please seek medical attention.

## 2016-08-12 NOTE — Assessment & Plan Note (Signed)
Patient has had intermittent cough for a number of years. Prior evaluation revealed it to possibly be related to reflux or air swallowing. He has a benign exam today. Most recent chest x-ray with some hyperinflation. We will refer to pulmonology. We will have him take his reflux medication twice daily for the next 2 weeks and see if that benefits him. He can try over-the-counter Claritin as well.

## 2016-08-12 NOTE — Assessment & Plan Note (Addendum)
Continue as needed Xanax. Refill given.

## 2016-08-12 NOTE — Progress Notes (Signed)
Pre visit review using our clinic review tool, if applicable. No additional management support is needed unless otherwise documented below in the visit note. 

## 2016-08-12 NOTE — Assessment & Plan Note (Signed)
Continues to have intermittent swelling in his left neck. Prior ultrasound did not reveal any cause. Does note some discomfort today on exam. Given persistence we will proceed with CT scan of his neck to evaluate for any abnormalities that could cause this.

## 2016-08-16 ENCOUNTER — Ambulatory Visit (INDEPENDENT_AMBULATORY_CARE_PROVIDER_SITE_OTHER): Payer: Medicare Other

## 2016-08-16 ENCOUNTER — Telehealth: Payer: Self-pay

## 2016-08-16 ENCOUNTER — Other Ambulatory Visit: Payer: Self-pay | Admitting: Family Medicine

## 2016-08-16 VITALS — BP 110/70 | HR 60 | Temp 98.0°F | Resp 16 | Ht 70.0 in | Wt 197.0 lb

## 2016-08-16 DIAGNOSIS — Z0000 Encounter for general adult medical examination without abnormal findings: Secondary | ICD-10-CM

## 2016-08-16 NOTE — Telephone Encounter (Signed)
Gave verbal to pharmacy, for patients refill of xanax.

## 2016-08-16 NOTE — Progress Notes (Signed)
Subjective:   [name]. is a 61 y.o. male who presents for an Initial Medicare Annual Wellness Visit.  Review of Systems  No ROS.  Medicare Wellness Visit. Additional risk factors are reflected in the social history.  Cardiac Risk Factors include: advanced age (>40men, >75 women);male gender    Objective:    Today's Vitals   08/16/16 0814 08/16/16 0828  BP: 110/70   Pulse: 60   Resp: 16   Temp: 98 F (36.7 C)   TempSrc: Oral   SpO2: 97%   Weight: 197 lb (89.4 kg)   Height: 5\' 10"  (1.778 m)   PainSc:  4    Body mass index is 28.27 kg/m.  Current Medications (verified) Outpatient Encounter Prescriptions as of 08/16/2016  Medication Sig  . ALPRAZolam (XANAX) 0.5 MG tablet TAKE 1 TABLET BY MOUTH EVERY NIGHT AT BEDTIME AS NEEDED FOR ANXIETY  . atorvastatin (LIPITOR) 10 MG tablet TAKE 1 TABLET(10 MG) BY MOUTH EVERY MORNING  . cyclobenzaprine (FLEXERIL) 10 MG tablet TAKE 1 TABLET(10 MG) BY MOUTH THREE TIMES DAILY AS NEEDED FOR MUSCLE SPASMS  . escitalopram (LEXAPRO) 10 MG tablet Take 1 tablet (10 mg total) by mouth daily.  . finasteride (PROSCAR) 5 MG tablet TAKE 1 TABLET BY MOUTH DAILY  . meloxicam (MOBIC) 15 MG tablet Take 1 tablet (15 mg total) by mouth daily as needed for pain.  . mirabegron ER (MYRBETRIQ) 50 MG TB24 tablet Take 50 mg by mouth daily.  . pantoprazole (PROTONIX) 40 MG tablet TAKE 1 TABLET(40 MG) BY MOUTH DAILY  . Plecanatide (TRULANCE) 3 MG TABS Take 3 mg by mouth daily.  . Probiotic Product (ALIGN) 4 MG CAPS Take 1 capsule by mouth daily (Patient taking differently: Take 1 capsule by mouth daily as needed. For bowel regularity)  . psyllium (METAMUCIL) 58.6 % powder Take 1 packet by mouth 3 (three) times daily.  . tadalafil (CIALIS) 10 MG tablet Take 0.5 tablets (5 mg total) by mouth daily as needed.  . triamcinolone (NASACORT) 55 MCG/ACT AERO nasal inhaler Place 2 sprays into the nose daily as needed. For allergies   No facility-administered  encounter medications on file as of 08/16/2016.     Allergies (verified) Other   History: Past Medical History:  Diagnosis Date  . Anxiety   . Arthritis   . BPH (benign prostatic hyperplasia) 11/19/2013  . 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 Bulging disc    C2/3, C3/4, C6/7  . Cervical disc herniation    C4/5 and C5/6  . Colon polyp 04/18/2011  . Constipation 08/31/2012  . Depression   . Diverticulosis   . 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 GERD (gastroesophageal reflux disease)   . H/O diverticulitis of colon   . Hemorrhoid   . Hiatal hernia   . Hyperlipidemia   . Levoscoliosis   . Sleep difficulties   . Spinal stenosis in cervical region    cord abutment C4/5  . Testicular pain, left   . Typical atrial flutter ([place]) 11/2015   a. CHADS2VASc => 0; b. on Eliquis for pending ablation    Past Surgical History:  Procedure Laterality Date  . ATRIAL FLUTTER ABLATION  02/17/2016  . COLONOSCOPY  2014  . COLONOSCOPY WITH PROPOFOL N/A 06/30/2016   Procedure: COLONOSCOPY WITH PROPOFOL;  Surgeon: Lucilla Lame, MD;  Location: [facility];  Service: Endoscopy;  Laterality: N/A;  . ELECTROPHYSIOLOGIC STUDY N/A 01/04/2016  Procedure: Cardioversion;  Surgeon: Wellington Hampshire, MD;  Location: [facility];  Service: Cardiovascular;  Laterality: N/A;  . ELECTROPHYSIOLOGIC STUDY N/A 02/17/2016   Procedure: A-Flutter Ablation;  Surgeon: Deboraha Sprang, MD;  Location: [facility];  Service: Cardiovascular;  Laterality: N/A;  . GANGLION CYST EXCISION Right 1994   wrist & back  . HERNIA REPAIR Right 1994   abdominal repair with mesh  . NASAL SEPTUM SURGERY  2011   Dr. [name]    Family History  Problem Relation Age of Onset  . Hyperlipidemia Mother   . Cancer Mother        skin cancer?  . Heart disease Mother 9       MI - died in her sleep  . Hyperlipidemia Father   . Heart disease Father   . Kidney disease Neg Hx   .  Prostate cancer Neg Hx    Social History   Occupational History  . Disability     Herniated disk, spinal stenosis   Social History Main Topics  . Smoking status: Former Smoker    Years: 20.00    Types: Cigarettes    Quit date: 02/08/1991  . Smokeless tobacco: Never Used  . Alcohol use No  . Drug use: No  . Sexual activity: Yes    Partners: Female   Tobacco Counseling Counseling given: Not Answered   Activities of Daily Living In your present state of health, do you have any difficulty performing the following activities: 08/16/2016 06/30/2016  Hearing? N N  Vision? N N  Difficulty concentrating or making decisions? N N  Walking or climbing stairs? Y N  Dressing or bathing? N N  Doing errands, shopping? N -  Preparing Food and eating ? N -  Using the Toilet? N -  In the past six months, have you accidently leaked urine? N -  Do you have problems with loss of bowel control? N -  Managing your Medications? N -  Managing your Finances? N -  Housekeeping or managing your Housekeeping? N -  Some recent data might be hidden    Immunizations and Health Maintenance Immunization History  Administered Date(s) Administered  . Influenza Whole 11/18/2010  . Influenza,inj,Quad PF,36+ Mos 10/17/2012, 11/02/2013, 10/16/2014  . Influenza-Unspecified 11/02/2011, 10/17/2012, 11/02/2013, 10/22/2015  . Tdap 10/17/2012   There are no preventive care reminders to display for this patient.  Patient Care Team: [name], MD as PCP - General (Family Medicine) Bary Castilla, Forest Gleason, MD (General Surgery) [name], MD as Consulting Physician (Family Medicine) Bary Castilla Forest Gleason, MD (General Surgery)  Indicate any recent Medical Services you may have received from other than [facility] providers in the past year (date may be approximate).    Assessment:   This is a routine wellness examination for [name]. The goal of the wellness visit is to assist the patient how to close the gaps  in care and create a preventative care plan for the patient.   The roster of all physicians providing medical care to patient is listed in the Snapshot section of the chart.  Osteoporosis risk reviewed.    Safety issues reviewed; Smoke and carbon monoxide detectors in the home. No firearms in the home.  Wears seatbelts when driving or riding with others. Patient does wear sunscreen or protective clothing when in direct sunlight. No violence in the home.  Patient is alert, normal appearance, oriented to person/place/and time.  Correctly identified the president of the [place], recall of 3/3  words, and performing simple calculations. Displays appropriate judgement and can read correct time from watch face.   No new identified risk were noted.  No failures at ADL's or IADL's.    BMI- discussed the importance of a healthy diet, water intake and the benefits of aerobic exercise. Educational material provided.   24 hour diet recall: Breakfast: cereal Lunch: tuna sandwich  Dinner: shell fish meat, green vegetable  Daily fluid intake:  1 cups of caffeine,  cups of water  Dental- every 3 months.  [facility].  Sleep patterns- Sleeps 6 hours at night.  Wakes feeling tired.  Rests during the day.  HIV screening discussed, postponed per patient preference.  Educational material provided.  Patient Concerns: None at this time. Follow up with PCP as needed.  Hearing/Vision screen Hearing Screening Comments: Patient is able to hear conversational tones without difficulty.  No issues reported.   Vision Screening Comments: Followed by [facility] Wears corrective lenses when reading Last OV 2017 Visual acuity not assessed per patient preference since they have regular follow up with the ophthalmologist  Dietary issues and exercise activities discussed: Current Exercise Habits: Home exercise routine, Type of exercise: stretching, Time (Minutes): 15, Frequency (Times/Week): 3, Weekly  Exercise (Minutes/Week): 45, Intensity: Mild, Exercise limited by: orthopedic condition(s)  Goals    . Healthy Lifestyle          Low carb foods Stay hydrated Stay active, stretch and walk for exercise as tolerated      Depression Screen PHQ 2/9 Scores 08/16/2016 08/12/2016 07/23/2015  PHQ - 2 Score 0 0 -  Exception Documentation - - Patient refusal    Fall Risk Fall Risk  08/16/2016 08/12/2016 07/23/2015  Falls in the past year? No No No    Cognitive Function: MMSE - Mini Mental State Exam 08/16/2016  Orientation to time 5  Orientation to Place 5  Registration 3  Attention/ Calculation 5  Recall 3  Language- name 2 objects 2  Language- repeat 1  Language- follow 3 step command 3  Language- read & follow direction 1  Write a sentence 1  Copy design 1  Total score 30        Screening Tests Health Maintenance  Topic Date Due  . HIV Screening  09/06/2017 (Originally 04/30/1970)  . INFLUENZA VACCINE  09/07/2016  . TETANUS/TDAP  10/18/2022  . COLONOSCOPY  07/01/2026  . Hepatitis C Screening  Completed        Plan:    End of life planning; Advance aging; Advanced directives discussed. Copy of current HCPOA/Living Will requested.    I have personally reviewed and noted the following in the patient's chart:   . Medical and social history . Use of alcohol, tobacco or illicit drugs  . Current medications and supplements . Functional ability and status . Nutritional status . Physical activity . Advanced directives . List of other physicians . Hospitalizations, surgeries, and ER visits in previous 12 months . Vitals . Screenings to include cognitive, depression, and falls . Referrals and appointments  In addition, I have reviewed and discussed with patient certain preventive protocols, quality metrics, and best practice recommendations. A written personalized care plan for preventive services as well as general preventive health recommendations were provided to  patient.     Varney Biles, LPN   3/49/1791

## 2016-08-16 NOTE — Patient Instructions (Addendum)
  [name] , Thank you for taking time to come for your Medicare Wellness Visit. I appreciate your ongoing commitment to your health goals. Please review the following plan we discussed and let me know if I can assist you in the future.   Follow up with Dr. Caryl Bis as needed.    Bring a copy of your [place] and/or Living Will to be scanned into chart.  Have a great day!  These are the goals we discussed: Goals    . Healthy Lifestyle          Low carb foods Stay hydrated Stay active, stretch and walk for exercise as tolerated       This is a list of the screening recommended for you and due dates:  Health Maintenance  Topic Date Due  . HIV Screening  09/06/2017*  . Flu Shot  09/07/2016  . Tetanus Vaccine  10/18/2022  . Colon Cancer Screening  07/01/2026  .  Hepatitis C: One time screening is recommended by Center for Disease Control  (CDC) for  adults born from [birth year] through 1964.   Completed  *Topic was postponed. The date shown is not the original due date.

## 2016-08-19 ENCOUNTER — Ambulatory Visit
Admission: RE | Admit: 2016-08-19 | Discharge: 2016-08-19 | Disposition: A | Discharge status: Home / Self Care | Payer: Medicare Other | Source: Ambulatory Visit | Attending: Family Medicine | Admitting: Family Medicine

## 2016-08-19 ENCOUNTER — Other Ambulatory Visit: Payer: Self-pay | Admitting: Family Medicine

## 2016-08-19 DIAGNOSIS — R221 Localized swelling, mass and lump, neck: Secondary | ICD-10-CM | POA: Diagnosis not present

## 2016-08-19 DIAGNOSIS — J3489 Other specified disorders of nose and nasal sinuses: Secondary | ICD-10-CM | POA: Diagnosis not present

## 2016-08-19 DIAGNOSIS — M47892 Other spondylosis, cervical region: Secondary | ICD-10-CM | POA: Insufficient documentation

## 2016-08-19 DIAGNOSIS — E042 Nontoxic multinodular goiter: Secondary | ICD-10-CM

## 2016-08-19 LAB — POCT I-STAT CREATININE: Creatinine, Ser: 1 mg/dL (ref 0.61–1.24)

## 2016-08-19 MED ORDER — IOPAMIDOL (ISOVUE-300) INJECTION 61%
75.0000 mL | Freq: Once | INTRAVENOUS | Status: AC | PRN
  Administered 2016-08-19: 75 mL via INTRAVENOUS

## 2016-08-19 MED ORDER — AMOXICILLIN-POT CLAVULANATE 875-125 MG PO TABS: 1.0000 | ORAL_TABLET | Freq: Two times a day (BID) | ORAL | 0 refills | Status: DC

## 2016-09-02 ENCOUNTER — Ambulatory Visit: Payer: Medicare Other

## 2016-09-06 NOTE — Progress Notes (Signed)
[facility] Consultation      Assessment and Plan:  Cough -Persistent cough, originating in the throat. Suspect that this is related to nasal drip and/or GERD. -Start over-the-counter Nasacort 2 sprays in each nostril once daily. -Continue Protonix daily. -Start Claritin-D once daily. -Patient is instructed to call us back in 1 month if cough does not improve, at that time we can prescribe a trial of Tessalon.  Obstructive sleep apnea --Sleep study 01/05/16, AHI equals 60. --Patient was not interested in CPAP due to severe claustrophobia, therefore he was referred for dental device. Also he does not believe that he has sleep apnea despite counseling him otherwise.  -We discussed the possibility of starting on nasal pillows, which may be helpful for him, he would like to consider this for some time before starting. Reminded him that we may have a time limit at which point we have to reorder a sleep study. I recommended the patient that we refer him to mask fitting clinic to get him a more ideal mask, then start on CPAP.  Atrial Fibrillation.  -Sleep apnea may be contributing to atrial fibrillation s/p ablation.    Depression/Anxiety.  -Patient has been on Lexapro which may be contributing to daytime sleepiness.  Claustrophobia -Patient has severe claustrophobia and is not interested in starting CPAP.  Sleep-related bruxism.  Date: 09/06/2016  MRN# [id number] [name]. 1955-11-30  Referring Physician:   [name] is a 61 y.o. old male seen in consultation for chief complaint of:    Chief Complaint  Patient presents with  . Advice Only    ref by Caryl Bis:  . Cough    doesn't cough at night unless he gets up; can smell certain foods such as ketchup starts coughing: clears throat frequently    HPI:   The patient is a 61 year old male with severe sleep apnea, atrial fibrillation. He was diagnosed with obstructive sleep apnea, but declined use  of CPAP. He is following up today for problems with persistent coughing which he describes as a tickle in the back of this throat. The cough has been there for several year, became more frequent about a year ago after his ablation.  He has reflux, he takes pantoprazole 40 mg daily. He has nasal drip and congestion, he has a nasal spray which he does not take very often.  He has a "non-allergic" dog he takes no antihistamines.      Medication:    Current Outpatient Prescriptions:  .  ALPRAZolam (XANAX) 0.5 MG tablet, TAKE 1 TABLET BY MOUTH EVERY NIGHT AT BEDTIME AS NEEDED FOR ANXIETY, Disp: 20 tablet, Rfl: 0 .  amoxicillin-clavulanate (AUGMENTIN) 875-125 MG tablet, Take 1 tablet by mouth 2 (two) times daily., Disp: 14 tablet, Rfl: 0 .  atorvastatin (LIPITOR) 10 MG tablet, TAKE 1 TABLET(10 MG) BY MOUTH EVERY MORNING, Disp: 90 tablet, Rfl: 0 .  cyclobenzaprine (FLEXERIL) 10 MG tablet, TAKE 1 TABLET(10 MG) BY MOUTH THREE TIMES DAILY AS NEEDED FOR MUSCLE SPASMS, Disp: 90 tablet, Rfl: 0 .  escitalopram (LEXAPRO) 10 MG tablet, Take 1 tablet (10 mg total) by mouth daily., Disp: 90 tablet, Rfl: 2 .  finasteride (PROSCAR) 5 MG tablet, TAKE 1 TABLET BY MOUTH DAILY, Disp: 30 tablet, Rfl: 0 .  meloxicam (MOBIC) 15 MG tablet, TAKE 1 TABLET(15 MG) BY MOUTH DAILY AS NEEDED FOR PAIN, Disp: 90 tablet, Rfl: 0 .  mirabegron ER (MYRBETRIQ) 50 MG TB24 tablet, Take 50 mg by mouth daily., Disp: ,  Rfl:  .  pantoprazole (PROTONIX) 40 MG tablet, TAKE 1 TABLET(40 MG) BY MOUTH DAILY, Disp: 90 tablet, Rfl: 1 .  Plecanatide (TRULANCE) 3 MG TABS, Take 3 mg by mouth daily., Disp: 30 tablet, Rfl: 6 .  Probiotic Product (ALIGN) 4 MG CAPS, Take 1 capsule by mouth daily (Patient taking differently: Take 1 capsule by mouth daily as needed. For bowel regularity), Disp: 30 capsule, Rfl: 3 .  psyllium (METAMUCIL) 58.6 % powder, Take 1 packet by mouth 3 (three) times daily., Disp: , Rfl:  .  tadalafil (CIALIS) 10 MG tablet, Take 0.5  tablets (5 mg total) by mouth daily as needed., Disp: 30 tablet, Rfl: 0 .  triamcinolone (NASACORT) 55 MCG/ACT AERO nasal inhaler, Place 2 sprays into the nose daily as needed. For allergies, Disp: , Rfl:    Allergies:  Other  Review of Systems: Gen:  Denies  fever, sweats, chills HEENT: Denies blurred vision, double vision. bleeds, sore throat Cvc:  No dizziness, chest pain. Resp:   Denies  shortness of breath Gi: Denies swallowing difficulty, stomach pain. Gu:  Denies bladder incontinence, burning urine Ext:   No Joint pain, stiffness. Skin: No skin rash,  hives  Endoc:  No polyuria, polydipsia. Psych: No depression, insomnia. Other:  All other systems were reviewed with the patient and were negative other that what is mentioned in the HPI.   Physical Examination:   VS: BP 118/80 (BP Location: Left Arm, Cuff Size: Normal)   Pulse 93   Ht 5\' 10"  (1.778 m)   Wt 198 lb (89.8 kg)   SpO2 97%   BMI 28.41 kg/m   General Appearance: No distress  Neuro:without focal findings,  speech normal,  HEENT: PERRLA, EOM intact.   Pulmonary: normal breath sounds, No wheezing.  CardiovascularNormal S1,S2.  No m/r/g.   Abdomen: Benign, Soft, non-tender. Renal:  No costovertebral tenderness  GU:  No performed at this time. Endoc: No evident thyromegaly, no signs of acromegaly. Skin:   warm, no rashes, no ecchymosis  Extremities: normal, no cyanosis, clubbing.  Other findings:    LABORATORY PANEL:   CBC No results for input(s): WBC, HGB, HCT, PLT in the last 168 hours. ------------------------------------------------------------------------------------------------------------------  Chemistries  No results for input(s): NA, K, CL, CO2, GLUCOSE, BUN, CREATININE, CALCIUM, MG, AST, ALT, ALKPHOS, BILITOT in the last 168 hours.  Invalid input(s): GFRCGP ------------------------------------------------------------------------------------------------------------------  Cardiac Enzymes No  results for input(s): TROPONINI in the last 168 hours. ------------------------------------------------------------  RADIOLOGY:  No results found.     Thank  you for the consultation and for allowing [facility] to assist in the care of your patient. Our recommendations are noted above.  Please contact us if we can be of further service.   Marda Stalker, MD.  Board Certified in Internal Medicine, Pulmonary Medicine, [place], and Sleep Medicine.  [facility] Office Number: [phone]  Patricia Pesa, M.D.  Vilinda Boehringer, M.D.  Merton Border, M.D  09/06/2016

## 2016-09-07 ENCOUNTER — Ambulatory Visit
Admission: RE | Admit: 2016-09-07 | Discharge: 2016-09-07 | Disposition: A | Discharge status: Home / Self Care | Payer: Medicare Other | Source: Ambulatory Visit | Attending: Family Medicine | Admitting: Family Medicine

## 2016-09-07 DIAGNOSIS — E042 Nontoxic multinodular goiter: Secondary | ICD-10-CM | POA: Diagnosis not present

## 2016-09-08 ENCOUNTER — Ambulatory Visit (INDEPENDENT_AMBULATORY_CARE_PROVIDER_SITE_OTHER): Payer: Medicare Other | Admitting: Internal Medicine

## 2016-09-08 ENCOUNTER — Encounter: Payer: Self-pay | Admitting: Internal Medicine

## 2016-09-08 VITALS — BP 118/80 | HR 93 | Ht 70.0 in | Wt 198.0 lb

## 2016-09-08 DIAGNOSIS — G4733 Obstructive sleep apnea (adult) (pediatric): Secondary | ICD-10-CM | POA: Diagnosis not present

## 2016-09-08 DIAGNOSIS — R05 Cough: Secondary | ICD-10-CM

## 2016-09-08 DIAGNOSIS — R059 Cough, unspecified: Secondary | ICD-10-CM

## 2016-09-08 NOTE — Patient Instructions (Addendum)
Take over the counter nasacort 2 sprays in each nostril once daily.   Take Claritin-D once daily.   If you do not improve after one month of the above,  call us back and will give a prescription for a cough medicine.

## 2016-09-22 DIAGNOSIS — M1712 Unilateral primary osteoarthritis, left knee: Secondary | ICD-10-CM | POA: Diagnosis not present

## 2016-10-17 ENCOUNTER — Other Ambulatory Visit: Payer: Self-pay | Admitting: Family Medicine

## 2016-10-27 ENCOUNTER — Other Ambulatory Visit: Payer: Self-pay | Admitting: Family Medicine

## 2016-10-31 DIAGNOSIS — M7122 Synovial cyst of popliteal space [Baker], left knee: Secondary | ICD-10-CM | POA: Diagnosis not present

## 2016-10-31 DIAGNOSIS — M1712 Unilateral primary osteoarthritis, left knee: Secondary | ICD-10-CM | POA: Diagnosis not present

## 2016-11-07 ENCOUNTER — Other Ambulatory Visit: Payer: Self-pay | Admitting: Family Medicine

## 2016-11-16 ENCOUNTER — Other Ambulatory Visit: Payer: Medicare Other

## 2016-11-16 DIAGNOSIS — N401 Enlarged prostate with lower urinary tract symptoms: Secondary | ICD-10-CM

## 2016-11-17 LAB — PSA: Prostate Specific Ag, Serum: 1.9 ng/mL (ref 0.0–4.0)

## 2016-11-21 DIAGNOSIS — G8929 Other chronic pain: Secondary | ICD-10-CM | POA: Diagnosis not present

## 2016-11-21 DIAGNOSIS — M5416 Radiculopathy, lumbar region: Secondary | ICD-10-CM | POA: Diagnosis not present

## 2016-11-21 DIAGNOSIS — M5442 Lumbago with sciatica, left side: Secondary | ICD-10-CM | POA: Diagnosis not present

## 2016-11-21 NOTE — Progress Notes (Signed)
9:02 AM   [name]. 1955-12-15 [id number]  Referring provider: [name], MD [address] [place], [place] [address]  Chief Complaint  Patient presents with  . Benign Prostatic Hypertrophy    1 year follow up  . Over Active Bladder    HPI: Patient is a 61 year old WM with BPH, OAB, nocturia and constipation currently on Cialis 5 mg, finasteride 5 mg and Myrbetriq 50 mg who presents today for a 12 month follow up.  His IPSS score today is 20, which is severs lower urinary tract symptomatology. He is unhappy with his quality life due to his urinary symptoms. His PVR is 18 mL.  His previous IPSS score is 21/4.  His previous PVR is 49 mL.     His major complaint today is urinary frequency x q 1 to 3 hours, intermittency, hesitancy and nocturia x 4-5.  He has had these symptoms for many  years.    He admits that he does not drink a lot of water.  He drinks caffeine free sodas and coffee.    He states the intermittency is improved with Cialis 5 mg daily.  He denies any dysuria, hematuria or suprapubic pain.   He currently taking Cialis ~ 5 mg daily.  His insurance will not cover 30 days of the 5 mg tablet.    He is taking the finasteride 5 mg daily.    He found the Myrbetriq 50 mg cost prohibitive and has not been taking the medication.  He did not find it effective anyway's.  He also denies any recent fevers, chills, nausea or vomiting.  He does not have a family history of PCa.  He states he is also suffering with erectile dysfunction. He states he has no drive to have intercourse. He states he just feels tired all the time. He states his wife is becoming very concerned and has question whether or not he is having an affair.       IPSS    Row Name 11/22/16 0800         International Prostate Symptom Score   How often have you had the sensation of not emptying your bladder? Less than 1 in 5     How often have you had to urinate less than every two  hours? Almost always     How often have you found you stopped and started again several times when you urinated? More than half the time     How often have you found it difficult to postpone urination? Less than half the time     How often have you had a weak urinary stream? Less than half the time     How often have you had to strain to start urination? Less than half the time     How many times did you typically get up at night to urinate? 4 Times     Total IPSS Score 20       Quality of Life due to urinary symptoms   If you were to spend the rest of your life with your urinary condition just the way it is now how would you feel about that? Terrible        Score:  1-7 Mild 8-19 Moderate 20-35 Severe     PMH: Past Medical History:  Diagnosis Date  . Anxiety   . Arthritis   . BPH (benign prostatic hyperplasia) 11/19/2013  . Bulge of lumbar disc without myelopathy  L2/3 through L5/6  . Bulging disc    C2/3, C3/4, C6/7  . Cervical disc herniation    C4/5 and C5/6  . Colon polyp 04/18/2011  . Constipation 08/31/2012  . Depression   . Diverticulosis   . 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 GERD (gastroesophageal reflux disease)   . H/O diverticulitis of colon   . Hemorrhoid   . Hiatal hernia   . Hyperlipidemia   . Levoscoliosis   . Sleep difficulties   . Spinal stenosis in cervical region    cord abutment C4/5  . Testicular pain, left   . Typical atrial flutter ([place]) 11/2015   a. CHADS2VASc => 0; b. on Eliquis for pending ablation     Surgical History: Past Surgical History:  Procedure Laterality Date  . ATRIAL FLUTTER ABLATION  02/17/2016  . COLONOSCOPY  2014  . COLONOSCOPY WITH PROPOFOL N/A 06/30/2016   Procedure: COLONOSCOPY WITH PROPOFOL;  Surgeon: Lucilla Lame, MD;  Location: [facility];  Service: Endoscopy;  Laterality: N/A;  . ELECTROPHYSIOLOGIC STUDY N/A 01/04/2016   Procedure: Cardioversion;  Surgeon: Wellington Hampshire, MD;   Location: [facility];  Service: Cardiovascular;  Laterality: N/A;  . ELECTROPHYSIOLOGIC STUDY N/A 02/17/2016   Procedure: A-Flutter Ablation;  Surgeon: Deboraha Sprang, MD;  Location: [facility];  Service: Cardiovascular;  Laterality: N/A;  . GANGLION CYST EXCISION Right 1994   wrist & back  . HERNIA REPAIR Right 1994   abdominal repair with mesh  . NASAL SEPTUM SURGERY  2011   Dr. [name]     Home Medications:  Allergies as of 11/22/2016      Reactions   Other Other (See Comments)   Pollen:  Sinus infection, cough, fatigue Pollen:  Sinus infection, cough, fatigue      Medication List       Accurate as of 11/22/16  9:02 AM. Always use your most recent med list.          ALIGN 4 MG Caps Take 1 capsule by mouth daily   ALPRAZolam 0.5 MG tablet Commonly known as:  XANAX TAKE 1 TABLET BY MOUTH EVERY NIGHT AT BEDTIME AS NEEDED FOR ANXIETY   atorvastatin 10 MG tablet Commonly known as:  LIPITOR TAKE 1 TABLET(10 MG) BY MOUTH EVERY MORNING   cyclobenzaprine 10 MG tablet Commonly known as:  FLEXERIL TAKE 1 TABLET(10 MG) BY MOUTH THREE TIMES DAILY AS NEEDED FOR MUSCLE SPASMS   escitalopram 10 MG tablet Commonly known as:  LEXAPRO Take 1 tablet (10 mg total) by mouth daily.   finasteride 5 MG tablet Commonly known as:  PROSCAR Take 1 tablet (5 mg total) by mouth daily.   meloxicam 15 MG tablet Commonly known as:  MOBIC TAKE 1 TABLET(15 MG) BY MOUTH DAILY AS NEEDED FOR PAIN   MYRBETRIQ 50 MG Tb24 tablet Generic drug:  mirabegron ER Take 50 mg by mouth daily.   NEXIUM 40 MG capsule Generic drug:  esomeprazole Take by mouth.   pantoprazole 40 MG tablet Commonly known as:  PROTONIX TAKE 1 TABLET(40 MG) BY MOUTH DAILY   Plecanatide 3 MG Tabs Commonly known as:  TRULANCE Take 3 mg by mouth daily.   psyllium 58.6 % powder Commonly known as:  METAMUCIL Take 1 packet by mouth 3 (three) times daily.   tadalafil 20 MG tablet Commonly known as:  CIALIS Take 1  tablet (20 mg total) by mouth daily as needed for erectile dysfunction.   triamcinolone 55 MCG/ACT Aero  nasal inhaler Commonly known as:  NASACORT Place 2 sprays into the nose daily as needed. For allergies   zolpidem 5 MG tablet Commonly known as:  AMBIEN Take by mouth.       Allergies:  Allergies  Allergen Reactions  . Other Other (See Comments)    Pollen:  Sinus infection, cough, fatigue Pollen:  Sinus infection, cough, fatigue    Family History: Family History  Problem Relation Age of Onset  . Hyperlipidemia Mother   . Cancer Mother        skin cancer?  . Heart disease Mother 12       MI - died in her sleep  . Hyperlipidemia Father   . Heart disease Father   . Kidney disease Neg Hx   . Prostate cancer Neg Hx   . Kidney cancer Neg Hx   . Bladder Cancer Neg Hx     Social History:  reports that he quit smoking about 25 years ago. His smoking use included Cigarettes. He quit after 20.00 years of use. He has never used smokeless tobacco. He reports that he does not drink alcohol or use drugs.  ROS: UROLOGY Frequent Urination?: Yes Hard to postpone urination?: No Burning/pain with urination?: No Get up at night to urinate?: Yes Leakage of urine?: No Urine stream starts and stops?: Yes Trouble starting stream?: Yes Do you have to strain to urinate?: No Blood in urine?: No Urinary tract infection?: No Sexually transmitted disease?: No Injury to kidneys or bladder?: No Painful intercourse?: No Weak stream?: No Erection problems?: Yes Penile pain?: No  Gastrointestinal Nausea?: No Vomiting?: No Indigestion/heartburn?: No Diarrhea?: No Constipation?: Yes  Constitutional Fever: No Night sweats?: No Weight loss?: No Fatigue?: Yes  Skin Skin rash/lesions?: No Itching?: No  Eyes Blurred vision?: No Double vision?: No  Ears/Nose/Throat Sore throat?: No Sinus problems?: Yes  Hematologic/Lymphatic Swollen glands?: No Easy bruising?:  No  Cardiovascular Leg swelling?: No Chest pain?: No  Respiratory Cough?: Yes Shortness of breath?: No  Endocrine Excessive thirst?: No  Musculoskeletal Back pain?: Yes Joint pain?: No  Neurological Headaches?: Yes Dizziness?: No  Psychologic Depression?: No Anxiety?: Yes  Physical Exam: BP (!) 141/91   Pulse 76   Ht 5\' 10"  (1.778 m)   Wt 193 lb 12.8 oz (87.9 kg)   BMI 27.81 kg/m   Constitutional: Well nourished. Alert and oriented, No acute distress. HEENT: [place] AT, moist mucus membranes. Trachea midline, no masses. Cardiovascular: No clubbing, cyanosis, or edema. Respiratory: Normal respiratory effort, no increased work of breathing. GI: Abdomen is soft, non tender, non distended, no abdominal masses. Liver and spleen not palpable.  No hernias appreciated.  Stool sample for occult testing is not indicated.   GU: No CVA tenderness.  No bladder fullness or masses.  Patient with circumcised phallus.   Urethral meatus is patent.  No penile discharge. No penile lesions or rashes. Scrotum without lesions, cysts, rashes and/or edema.  Testicles are located scrotally bilaterally. No masses are appreciated in the testicles. Left and right epididymis are normal. Rectal: Patient with  normal sphincter tone. Anus and perineum without scarring or rashes. No rectal masses are appreciated. Prostate is approximately 55 grams, no nodules are appreciated.  Seminal vesicles are normal. Skin: No rashes, bruises or suspicious lesions. Lymph: No cervical or inguinal adenopathy. Neurologic: Grossly intact, no focal deficits, moving all 4 extremities. Psychiatric: Normal mood and affect.  Laboratory Data: Lab Results  Component Value Date   WBC 4.9 02/15/2016   HGB  15.4 02/15/2016   HCT 45.1 02/15/2016   MCV 90 02/15/2016   PLT 213 02/15/2016    Lab Results  Component Value Date   CREATININE 1.00 08/19/2016    Lab Results  Component Value Date   PSA 3.49 11/19/2013  PSA                              3.6 ng/mL                                                       11/24/2014 PSA   2.8 ng/mL     12/28/2015 PSA   1.9 ng/mL      11/16/2016  Lab Results  Component Value Date   TSH 0.585 11/29/2015       Component Value Date/Time   CHOL 159 12/01/2015 0354   HDL 43 12/01/2015 0354   CHOLHDL 3.7 12/01/2015 0354   VLDL 18 12/01/2015 0354   LDLCALC 98 12/01/2015 0354   I have reviewed the labs.  Pertinent Imaging: Results for KATSUMI, WISLER (MRN [id number]) as of 11/22/2016 08:37  Ref. Range 11/22/2016 08:34  Scan Result Unknown 18    Assessment & Plan:    1. BPH (benign prostatic hyperplasia) with LUTS:      - IPSS score is 20/5, it is stable  - Increase the Cialis to 20 mg and patient will cut the pills in half or fourths as he finds it helpful - script given  - Continue the finasteride 5 mg; refills given  - RTC in 12 months for IPSS, PSA and PVR   - Bladder Scan (Post Void Residual) in office    2. OAB  - encouraged patient to increase his water intake and eliminate sodas from his diet  - He will return in 12 months for I PSS score and PVR  3. Constipation  - Currently managed with over-the-counter stool softeners and laxatives.  4. Pelvic floor dysfunction  - referred the patient for PT, but he found it cost prohibitive  5. Sleep apnea  - explained to the patient that untreated sleep apnea contributes to LU TS and ED  - I also explained that untreated sleep apnea can to death, cardiac arrhythmias, falling asleep behind the wheel causing harm to himself and others and mood  - encouraged the patient to sleep with his CPAP  6. ED  - Patient states that he has no libido  - We will check a testosterone level today   Return for IPSS, SHIM, PSA and exam.  These notes generated with voice recognition software. I apologize for typographical errors.  Zara Council, [facility] [address], [place], [place] [address] [phone]

## 2016-11-22 ENCOUNTER — Encounter: Payer: Self-pay | Admitting: Urology

## 2016-11-22 ENCOUNTER — Ambulatory Visit (INDEPENDENT_AMBULATORY_CARE_PROVIDER_SITE_OTHER): Payer: Medicare Other | Admitting: Urology

## 2016-11-22 VITALS — BP 141/91 | HR 76 | Ht 70.0 in | Wt 193.8 lb

## 2016-11-22 DIAGNOSIS — M6289 Other specified disorders of muscle: Secondary | ICD-10-CM | POA: Diagnosis not present

## 2016-11-22 DIAGNOSIS — R972 Elevated prostate specific antigen [PSA]: Secondary | ICD-10-CM

## 2016-11-22 DIAGNOSIS — N529 Male erectile dysfunction, unspecified: Secondary | ICD-10-CM

## 2016-11-22 DIAGNOSIS — N401 Enlarged prostate with lower urinary tract symptoms: Secondary | ICD-10-CM | POA: Diagnosis not present

## 2016-11-22 DIAGNOSIS — G4730 Sleep apnea, unspecified: Secondary | ICD-10-CM

## 2016-11-22 DIAGNOSIS — H11432 Conjunctival hyperemia, left eye: Secondary | ICD-10-CM | POA: Diagnosis not present

## 2016-11-22 DIAGNOSIS — N3281 Overactive bladder: Secondary | ICD-10-CM

## 2016-11-22 DIAGNOSIS — K5900 Constipation, unspecified: Secondary | ICD-10-CM | POA: Diagnosis not present

## 2016-11-22 DIAGNOSIS — H01024 Squamous blepharitis left upper eyelid: Secondary | ICD-10-CM | POA: Diagnosis not present

## 2016-11-22 DIAGNOSIS — H01021 Squamous blepharitis right upper eyelid: Secondary | ICD-10-CM | POA: Diagnosis not present

## 2016-11-22 DIAGNOSIS — N138 Other obstructive and reflux uropathy: Secondary | ICD-10-CM | POA: Diagnosis not present

## 2016-11-22 LAB — BLADDER SCAN AMB NON-IMAGING: Scan Result: 18

## 2016-11-22 MED ORDER — TADALAFIL 20 MG PO TABS: 20.0000 mg | ORAL_TABLET | Freq: Every day | ORAL | 3 refills | Status: DC | PRN

## 2016-11-22 MED ORDER — FINASTERIDE 5 MG PO TABS: 5.0000 mg | ORAL_TABLET | Freq: Every day | ORAL | 3 refills | Status: DC

## 2016-11-22 NOTE — Patient Instructions (Addendum)
Sleep Apnea Sleep apnea is a condition in which breathing pauses or becomes shallow during sleep. Episodes of sleep apnea usually last 10 seconds or longer, and they may occur as many as 20 times an hour. Sleep apnea disrupts your sleep and keeps your body from getting the rest that it needs. This condition can increase your risk of certain health problems, including:  Heart attack.  Stroke.  Obesity.  Diabetes.  Heart failure.  Irregular heartbeat.  There are three kinds of sleep apnea:  Obstructive sleep apnea. This kind is caused by a blocked or collapsed airway.  Central sleep apnea. This kind happens when the part of the brain that controls breathing does not send the correct signals to the muscles that control breathing.  Mixed sleep apnea. This is a combination of obstructive and central sleep apnea.  What are the causes? The most common cause of this condition is a collapsed or blocked airway. An airway can collapse or become blocked if:  Your throat muscles are abnormally relaxed.  Your tongue and tonsils are larger than normal.  You are overweight.  Your airway is smaller than normal.  What increases the risk? This condition is more likely to develop in people who:  Are overweight.  Smoke.  Have a smaller than normal airway.  Are elderly.  Are male.  Drink alcohol.  Take sedatives or tranquilizers.  Have a family history of sleep apnea.  What are the signs or symptoms? Symptoms of this condition include:  Trouble staying asleep.  Daytime sleepiness and tiredness.  Irritability.  Loud snoring.  Morning headaches.  Trouble concentrating.  Forgetfulness.  Decreased interest in sex.  Unexplained sleepiness.  Mood swings.  Personality changes.  Feelings of depression.  Waking up often during the night to urinate.  Dry mouth.  Sore throat.  How is this diagnosed? This condition may be diagnosed with:  A medical history.  A  physical exam.  A series of tests that are done while you are sleeping (sleep study). These tests are usually done in a sleep lab, but they may also be done at home.  How is this treated? Treatment for this condition aims to restore normal breathing and to ease symptoms during sleep. It may involve managing health issues that can affect breathing, such as high blood pressure or obesity. Treatment may include:  Sleeping on your side.  Using a decongestant if you have nasal congestion.  Avoiding the use of depressants, including alcohol, sedatives, and narcotics.  Losing weight if you are overweight.  Making changes to your diet.  Quitting smoking.  Using a device to open your airway while you sleep, such as: ? An oral appliance. This is a custom-made mouthpiece that shifts your lower jaw forward. ? A continuous positive airway pressure (CPAP) device. This device delivers oxygen to your airway through a mask. ? A nasal expiratory positive airway pressure (EPAP) device. This device has valves that you put into each nostril. ? A bi-level positive airway pressure (BPAP) device. This device delivers oxygen to your airway through a mask.  Surgery if other treatments do not work. During surgery, excess tissue is removed to create a wider airway.  It is important to get treatment for sleep apnea. Without treatment, this condition can lead to:  High blood pressure.  Coronary artery disease.  (Men) An inability to achieve or maintain an erection (impotence).  Reduced thinking abilities.  Follow these instructions at home:  Make any lifestyle changes that your health   care provider recommends.  Eat a healthy, well-balanced diet.  Take over-the-counter and prescription medicines only as told by your health care provider.  Avoid using depressants, including alcohol, sedatives, and narcotics.  Take steps to lose weight if you are overweight.  If you were given a device to open your  airway while you sleep, use it only as told by your health care provider.  Do not use any tobacco products, such as cigarettes, chewing tobacco, and e-cigarettes. If you need help quitting, ask your health care provider.  Keep all follow-up visits as told by your health care provider. This is important. Contact a health care provider if:  The device that you received to open your airway during sleep is uncomfortable or does not seem to be working.  Your symptoms do not improve.  Your symptoms get worse. Get help right away if:  You develop chest pain.  You develop shortness of breath.  You develop discomfort in your back, arms, or stomach.  You have trouble speaking.  You have weakness on one side of your body.  You have drooping in your face. These symptoms may represent a serious problem that is an emergency. Do not wait to see if the symptoms will go away. Get medical help right away. Call your local emergency services (911 in the [place] Do not drive yourself to the hospital. This information is not intended to replace advice given to you by your health care provider. Make sure you discuss any questions you have with your health care provider. Document Released: 01/14/2002 Document Revised: 09/20/2015 Document Reviewed: 11/03/2014 Elsevier Interactive Patient Education  2018 [employer].   Nocturia  - I explained to the patient that nocturia is often multi-factorial and difficult to treat.  Sleeping disorders, heart conditions, peripheral vascular disease, diabetes, an enlarged prostate for men, an urethral stricture causing bladder outlet obstruction and/or certain medications can contribute to nocturia.  - I have suggested that the patient avoid caffeine after noon and alcohol in the evening.  He or she may also benefit from fluid restrictions after 6:00 in the evening and voiding just prior to bedtime.  - I have explained that research studies have showed that over 84% of  patients with sleep apnea reported frequent nighttime urination.   With sleep apnea, oxygen decreases, carbon dioxide increases, the blood become more acidic, the heart rate drops and blood vessels in the lung constrict.  The body is then alerted that something is very wrong. The sleeper must wake enough to reopen the airway. By this time, the heart is racing and experiences a false signal of fluid overload. The heart excretes a hormone-like protein that tells the body to get rid of sodium and water, resulting in nocturia.  -  I also informed the patient that a recent study noted that decreasing sodium intake to 2.3 grams daily, if they don't have issues with hyponatremia, can also reduce the number of nightly voids  - The patient may benefit from a discussion with his or her primary care physician to see if he or she has risk factors for sleep apnea or other sleep disturbances and obtaining a sleep study.  Sleep apnea also has a detrimental effect on erections

## 2016-11-23 ENCOUNTER — Telehealth: Payer: Self-pay

## 2016-11-23 LAB — TESTOSTERONE: Testosterone: 485 ng/dL (ref 264–916)

## 2016-11-23 NOTE — Telephone Encounter (Signed)
Spoke with pt in reference to lab results and treating sleep apnea. Pt voiced understanding.

## 2016-11-23 NOTE — Telephone Encounter (Signed)
-----   Message from [name], [name] sent at 11/23/2016  7:43 AM EDT ----- Please let [name] know that his testosterone level is normal.  He really needs to have his sleep apnea treated to improve his erections.

## 2016-11-28 ENCOUNTER — Other Ambulatory Visit: Payer: Self-pay | Admitting: Urology

## 2016-11-28 DIAGNOSIS — R972 Elevated prostate specific antigen [PSA]: Secondary | ICD-10-CM

## 2016-11-30 DIAGNOSIS — M5416 Radiculopathy, lumbar region: Secondary | ICD-10-CM | POA: Diagnosis not present

## 2017-01-05 ENCOUNTER — Other Ambulatory Visit: Payer: Self-pay | Admitting: Family Medicine

## 2017-01-07 ENCOUNTER — Other Ambulatory Visit: Payer: Self-pay | Admitting: Family Medicine

## 2017-01-23 ENCOUNTER — Ambulatory Visit (INDEPENDENT_AMBULATORY_CARE_PROVIDER_SITE_OTHER): Payer: Medicare Other | Admitting: Family Medicine

## 2017-01-23 ENCOUNTER — Other Ambulatory Visit: Payer: Self-pay

## 2017-01-23 ENCOUNTER — Encounter: Payer: Self-pay | Admitting: Family Medicine

## 2017-01-23 VITALS — BP 140/96 | HR 80 | Temp 98.0°F | Wt 198.8 lb

## 2017-01-23 DIAGNOSIS — E785 Hyperlipidemia, unspecified: Secondary | ICD-10-CM

## 2017-01-23 DIAGNOSIS — R05 Cough: Secondary | ICD-10-CM | POA: Diagnosis not present

## 2017-01-23 DIAGNOSIS — L729 Follicular cyst of the skin and subcutaneous tissue, unspecified: Secondary | ICD-10-CM

## 2017-01-23 DIAGNOSIS — R053 Chronic cough: Secondary | ICD-10-CM

## 2017-01-23 DIAGNOSIS — M5412 Radiculopathy, cervical region: Secondary | ICD-10-CM | POA: Insufficient documentation

## 2017-01-23 DIAGNOSIS — I10 Essential (primary) hypertension: Secondary | ICD-10-CM | POA: Diagnosis not present

## 2017-01-23 DIAGNOSIS — F4323 Adjustment disorder with mixed anxiety and depressed mood: Secondary | ICD-10-CM

## 2017-01-23 DIAGNOSIS — E663 Overweight: Secondary | ICD-10-CM | POA: Diagnosis not present

## 2017-01-23 LAB — COMPREHENSIVE METABOLIC PANEL
ALT: 18 U/L (ref 0–53)
AST: 16 U/L (ref 0–37)
Albumin: 4 g/dL (ref 3.5–5.2)
Alkaline Phosphatase: 65 U/L (ref 39–117)
BUN: 18 mg/dL (ref 6–23)
CO2: 32 mEq/L (ref 19–32)
Calcium: 9.2 mg/dL (ref 8.4–10.5)
Chloride: 104 mEq/L (ref 96–112)
Creatinine, Ser: 0.91 mg/dL (ref 0.40–1.50)
GFR: 90.1 mL/min (ref >60.00)
Glucose, Bld: 94 mg/dL (ref 70–99)
Potassium: 4.1 mEq/L (ref 3.5–5.1)
Sodium: 139 mEq/L (ref 135–145)
Total Bilirubin: 1.2 mg/dL (ref 0.2–1.2)
Total Protein: 7 g/dL (ref 6.0–8.3)

## 2017-01-23 LAB — LIPID PANEL
Cholesterol: 168 mg/dL (ref 0–200)
HDL: 53.5 mg/dL (ref >39.00)
LDL Cholesterol: 92 mg/dL (ref 0–99)
NonHDL: 114.07
Total CHOL/HDL Ratio: 3
Triglycerides: 110 mg/dL (ref 0.0–149.0)
VLDL: 22 mg/dL (ref 0.0–40.0)

## 2017-01-23 NOTE — Patient Instructions (Signed)
Nice to see you. We are going to get an MRI of your neck. If you have worsening issues numbness or you develop worsening weakness please be evaluated immediately. We will likely start you on blood pressure medication.  We will see what your lab work shows first.

## 2017-01-23 NOTE — Assessment & Plan Note (Signed)
Symptoms concerning for nerve impingement.  Unlikely related to carpal tunnel given negative Tinel's and Phalen's and history is more consistent with cervical nerve impingement.  Will obtain an MRI to evaluate further.  Discussed if he had worsening symptoms he should be evaluated.

## 2017-01-23 NOTE — Assessment & Plan Note (Signed)
Relatively stable.  No SI.  He will continue his current regimen.

## 2017-01-23 NOTE — Assessment & Plan Note (Signed)
Suspect skin cyst adjacent to cervical spine.  Suspect MRI will pick this up to further define it.  He will monitor.  If enlarging consider dermatology evaluation.

## 2017-01-23 NOTE — Progress Notes (Signed)
Tyler Rumps, MD Phone: [phone]  [name]. is a 61 y.o. male who presents today for follow-up.  Patient notes chronic left hand numbness in the ulnar aspect in the third through fifth fingers for many years.  Does note over the last several months he will get a shocklike sensation from his distal forearm down into his hand.  Will have pain in his wrist at times with this.  Notes when this occurs his whole hand will feel numb.  Does note his left hand feels slightly weaker than previously.  This has not worsened over the last 2 months.  Does note a history of herniated disks and spinal stenosis in his neck.  Notes a skin cyst adjacent to his cervical spine that has been present for longer than 2 months and has not changed.  Blood pressure has been elevated on several occasions and has been consistently elevated at home in the 140s over 80s-100.  No chest pain or shortness of breath.  He has not ever been on medication.  Anxiety/depression: Patient notes he rarely takes Xanax to help relax his mind at night when he tries to go to sleep.  He is taking Lexapro as well.  Does note some depression.  No SI.  Notes this is relatively stable.  Patient notes his chronic cough is slightly better particularly when he takes Claritin-D and uses Nasacort though he has not been using these consistently.  He notes the Claritin-D dries him out.  Was evaluated by pulmonology.  Social History   Tobacco Use  Smoking Status Former Smoker  . Years: 20.00  . Types: Cigarettes  . Last attempt to quit: 02/08/1991  . Years since quitting: 25.9  Smokeless Tobacco Never Used     ROS see history of present illness  Objective  Physical Exam Vitals:   01/23/17 0816  BP: (!) 140/96  Pulse: 80  Temp: 98 F (36.7 C)  SpO2: 98%    BP Readings from Last 3 Encounters:  01/23/17 (!) 140/96  11/22/16 (!) 141/91  09/08/16 118/80   Wt Readings from Last 3 Encounters:  01/23/17 198 lb 12.8 oz  (90.2 kg)  11/22/16 193 lb 12.8 oz (87.9 kg)  09/08/16 198 lb (89.8 kg)    Physical Exam  Constitutional: No distress.  Cardiovascular: Normal rate, regular rhythm and normal heart sounds.  Pulmonary/Chest: Effort normal and breath sounds normal.  Musculoskeletal: He exhibits no edema.  Slight tenderness around C7 or C8, no skin changes or swelling noted, notes minor muscular tenderness as well in his cervical spine, has discomfort on Spurling's bilaterally though no radiation, negative Tinel's and Phalen's in ends, small apparent skin cyst to the right of C7/C8  Neurological: He is alert.  CN 2-12 intact, 4+/5 strength left grip, otherwise 5/5 strength in right grip, bilateral biceps, triceps, quads, hamstrings, plantar and dorsiflexion, sensation to light touch intact in bilateral UE and LE, normal gait  Skin: He is not diaphoretic.     Assessment/Plan: Please see individual problem list.  Cervical radiculopathy Symptoms concerning for nerve impingement.  Unlikely related to carpal tunnel given negative Tinel's and Phalen's and history is more consistent with cervical nerve impingement.  Will obtain an MRI to evaluate further.  Discussed if he had worsening symptoms he should be evaluated.  Adjustment disorder with mixed anxiety and depressed mood Relatively stable.  No SI.  He will continue his current regimen.  Chronic cough Improving.  Encouraged use of regular Claritin and Nasacort.  Hypertension Discussed persistent elevation and meets criteria for hypertension.  Will check a BMP and then likely start on losartan.  Discussed amlodipine though patient already has issues with constipation and also discussed HCTZ though patient already urinates frequently given BPH.  Skin cyst Suspect skin cyst adjacent to cervical spine.  Suspect MRI will pick this up to further define it.  He will monitor.  If enlarging consider dermatology evaluation.   Ezreal was seen today for follow-up and  numbness.  Diagnoses and all orders for this visit:  Cervical radiculopathy -     MR Cervical Spine Wo Contrast; Future  Hyperlipidemia, unspecified hyperlipidemia type -     Comp Met (CMET) -     Lipid panel  Overweight  Adjustment disorder with mixed anxiety and depressed mood  Chronic cough  Essential hypertension  Skin cyst    Orders Placed This Encounter  Procedures  . MR Cervical Spine Wo Contrast    Needs open MRI.    Standing Status:   Future    Standing Expiration Date:   03/26/2018    Order Specific Question:   What is the patient's sedation requirement?    Answer:   No Sedation    Order Specific Question:   Does the patient have a pacemaker or implanted devices?    Answer:   No    Order Specific Question:   Preferred imaging location?    Answer:   [facility] (table limit-300lbs)    Order Specific Question:   Radiology Contrast Protocol - do NOT remove file path    Answer:   [email or web address]  . Comp Met (CMET)  . Lipid panel    No orders of the defined types were placed in this encounter.    Tyler Rumps, MD [place]

## 2017-01-23 NOTE — Assessment & Plan Note (Signed)
Discussed persistent elevation and meets criteria for hypertension.  Will check a BMP and then likely start on losartan.  Discussed amlodipine though patient already has issues with constipation and also discussed HCTZ though patient already urinates frequently given BPH.

## 2017-01-23 NOTE — Assessment & Plan Note (Signed)
Improving.  Encouraged use of regular Claritin and Nasacort.

## 2017-01-25 ENCOUNTER — Other Ambulatory Visit: Payer: Medicare Other

## 2017-01-25 ENCOUNTER — Telehealth: Payer: Self-pay

## 2017-01-25 DIAGNOSIS — I10 Essential (primary) hypertension: Secondary | ICD-10-CM

## 2017-01-25 MED ORDER — LOSARTAN POTASSIUM 50 MG PO TABS: 50.0000 mg | ORAL_TABLET | Freq: Every day | ORAL | 3 refills | Status: DC

## 2017-01-25 NOTE — Telephone Encounter (Signed)
Patient was seen 01/23/17 and was told he was to start a new medication after labs returned, he would like this sent to local pharmacy, patient is scheduled for bp check 02/02/17

## 2017-01-25 NOTE — Telephone Encounter (Signed)
Losartan to pharmacy.  He needs lab work at the time of his blood pressure check.  Order has been placed.  Thanks.

## 2017-01-25 NOTE — Telephone Encounter (Signed)
Patient notified and scheduled 

## 2017-01-30 ENCOUNTER — Ambulatory Visit: Payer: Medicare Other

## 2017-02-02 ENCOUNTER — Other Ambulatory Visit (INDEPENDENT_AMBULATORY_CARE_PROVIDER_SITE_OTHER): Payer: Medicare Other

## 2017-02-02 ENCOUNTER — Ambulatory Visit (INDEPENDENT_AMBULATORY_CARE_PROVIDER_SITE_OTHER): Payer: Medicare Other | Admitting: *Deleted

## 2017-02-02 DIAGNOSIS — I10 Essential (primary) hypertension: Secondary | ICD-10-CM

## 2017-02-02 LAB — BASIC METABOLIC PANEL
BUN: 16 mg/dL (ref 6–23)
CO2: 31 mEq/L (ref 19–32)
Calcium: 9.1 mg/dL (ref 8.4–10.5)
Chloride: 103 mEq/L (ref 96–112)
Creatinine, Ser: 0.94 mg/dL (ref 0.40–1.50)
GFR: 86.78 mL/min (ref >60.00)
Glucose, Bld: 69 mg/dL — ABNORMAL LOW (ref 70–99)
Potassium: 4.3 mEq/L (ref 3.5–5.1)
Sodium: 138 mEq/L (ref 135–145)

## 2017-02-02 NOTE — Progress Notes (Signed)
Blood pressure is improved and adequately controlled.  He should continue on the losartan.  Follow-up in 3 months.  Thanks.

## 2017-02-02 NOTE — Progress Notes (Signed)
Patient presented for one week BP check after starting losartan 50 mg daily. BP taken in left arm 120/88 pulse 78. Patient stated he really has only been taking the medication for 6 days.

## 2017-02-03 NOTE — Progress Notes (Signed)
Patient notified and voiced understanding and will FU in 3 months with PCP.

## 2017-02-07 ENCOUNTER — Other Ambulatory Visit: Payer: Self-pay | Admitting: Internal Medicine

## 2017-02-07 ENCOUNTER — Other Ambulatory Visit: Payer: Self-pay | Admitting: Family Medicine

## 2017-02-22 ENCOUNTER — Emergency Department: Payer: No Typology Code available for payment source

## 2017-02-22 ENCOUNTER — Telehealth: Payer: Self-pay

## 2017-02-22 ENCOUNTER — Emergency Department
Admission: EM | Admit: 2017-02-22 | Discharge: 2017-02-22 | Disposition: A | Discharge status: Home / Self Care | Payer: No Typology Code available for payment source | Attending: Student in an Organized Health Care Education/Training Program | Admitting: Student in an Organized Health Care Education/Training Program

## 2017-02-22 ENCOUNTER — Other Ambulatory Visit: Payer: Self-pay

## 2017-02-22 DIAGNOSIS — Y92410 Unspecified street and highway as the place of occurrence of the external cause: Secondary | ICD-10-CM | POA: Diagnosis not present

## 2017-02-22 DIAGNOSIS — M542 Cervicalgia: Secondary | ICD-10-CM | POA: Insufficient documentation

## 2017-02-22 DIAGNOSIS — S199XXA Unspecified injury of neck, initial encounter: Secondary | ICD-10-CM | POA: Diagnosis not present

## 2017-02-22 DIAGNOSIS — Z87891 Personal history of nicotine dependence: Secondary | ICD-10-CM | POA: Diagnosis not present

## 2017-02-22 DIAGNOSIS — M79642 Pain in left hand: Secondary | ICD-10-CM | POA: Diagnosis not present

## 2017-02-22 DIAGNOSIS — Y999 Unspecified external cause status: Secondary | ICD-10-CM | POA: Insufficient documentation

## 2017-02-22 DIAGNOSIS — R0789 Other chest pain: Secondary | ICD-10-CM | POA: Diagnosis present

## 2017-02-22 DIAGNOSIS — Z79899 Other long term (current) drug therapy: Secondary | ICD-10-CM | POA: Insufficient documentation

## 2017-02-22 DIAGNOSIS — Y9389 Activity, other specified: Secondary | ICD-10-CM | POA: Diagnosis not present

## 2017-02-22 DIAGNOSIS — S60222A Contusion of left hand, initial encounter: Secondary | ICD-10-CM | POA: Diagnosis not present

## 2017-02-22 DIAGNOSIS — I10 Essential (primary) hypertension: Secondary | ICD-10-CM | POA: Diagnosis not present

## 2017-02-22 DIAGNOSIS — S20219A Contusion of unspecified front wall of thorax, initial encounter: Secondary | ICD-10-CM | POA: Diagnosis not present

## 2017-02-22 LAB — CBC WITH DIFFERENTIAL/PLATELET
Basophils Absolute: 0 10*3/uL (ref 0–0.1)
Basophils Relative: 1 %
Eosinophils Absolute: 0.1 10*3/uL (ref 0–0.7)
Eosinophils Relative: 2 %
HCT: 44.5 % (ref 40.0–52.0)
Hemoglobin: 15.2 g/dL (ref 13.0–18.0)
Lymphocytes Relative: 35 %
Lymphs Abs: 2.3 10*3/uL (ref 1.0–3.6)
MCH: 29.9 pg (ref 26.0–34.0)
MCHC: 34.3 g/dL (ref 32.0–36.0)
MCV: 87.3 fL (ref 80.0–100.0)
Monocytes Absolute: 0.5 10*3/uL (ref 0.2–1.0)
Monocytes Relative: 8 %
Neutro Abs: 3.6 10*3/uL (ref 1.4–6.5)
Neutrophils Relative %: 54 %
Platelets: 207 10*3/uL (ref 150–440)
RBC: 5.1 MIL/uL (ref 4.40–5.90)
RDW: 14 % (ref 11.5–14.5)
WBC: 6.6 10*3/uL (ref 3.8–10.6)

## 2017-02-22 LAB — BASIC METABOLIC PANEL
Anion gap: 10 (ref 5–15)
BUN: 18 mg/dL (ref 6–20)
CO2: 24 mmol/L (ref 22–32)
Calcium: 9.4 mg/dL (ref 8.9–10.3)
Chloride: 106 mmol/L (ref 101–111)
Creatinine, Ser: 1.05 mg/dL (ref 0.61–1.24)
GFR calc Af Amer: >60 mL/min (ref >60)
GFR calc non Af Amer: >60 mL/min (ref >60)
Glucose, Bld: 151 mg/dL — ABNORMAL HIGH (ref 65–99)
Potassium: 3.8 mmol/L (ref 3.5–5.1)
Sodium: 140 mmol/L (ref 135–145)

## 2017-02-22 MED ORDER — HYDROCODONE-ACETAMINOPHEN 5-325 MG PO TABS: 1.0000 | ORAL_TABLET | ORAL | 0 refills | Status: DC | PRN

## 2017-02-22 MED ORDER — CYCLOBENZAPRINE HCL 10 MG PO TABS
5.0000 mg | ORAL_TABLET | Freq: Once | ORAL | Status: AC
  Administered 2017-02-22: 5 mg via ORAL
  Filled 2017-02-22: qty 1

## 2017-02-22 MED ORDER — NAPROXEN 500 MG PO TABS: 500.0000 mg | ORAL_TABLET | Freq: Two times a day (BID) | ORAL | 0 refills | Status: DC

## 2017-02-22 MED ORDER — CYCLOBENZAPRINE HCL 10 MG PO TABS: 10.0000 mg | ORAL_TABLET | Freq: Three times a day (TID) | ORAL | 0 refills | Status: DC | PRN

## 2017-02-22 MED ORDER — HYDROCODONE-ACETAMINOPHEN 5-325 MG PO TABS
1.0000 | ORAL_TABLET | Freq: Once | ORAL | Status: AC
  Administered 2017-02-22: 1 via ORAL
  Filled 2017-02-22: qty 1

## 2017-02-22 MED ORDER — NAPROXEN 500 MG PO TABS
500.0000 mg | ORAL_TABLET | Freq: Once | ORAL | Status: AC
  Administered 2017-02-22: 500 mg via ORAL
  Filled 2017-02-22: qty 1

## 2017-02-22 NOTE — Telephone Encounter (Signed)
Noted. Thanks for helping sort it out.

## 2017-02-22 NOTE — Telephone Encounter (Signed)
Order previously placed.  I will forward to [facility] to see if we can get this scheduled for him.
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
## 2017-02-22 NOTE — Telephone Encounter (Signed)
It was sent to GI in December. They contact the patient to schedule since they have questions that they need to speak to the patient about. I have resubmitted it to them as the second request. [name]
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
## 2017-02-22 NOTE — Telephone Encounter (Signed)
Patient states he was supposed to have an open mri done but has not heard anything about it, please advise order was placed 01/25/17

## 2017-02-23 ENCOUNTER — Telehealth: Payer: Self-pay

## 2017-02-23 NOTE — Telephone Encounter (Signed)
Patient has been scheduled

## 2017-02-23 NOTE — Telephone Encounter (Signed)
Where should I schedule? No open slots
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
## 2017-02-23 NOTE — Telephone Encounter (Signed)
Okay to place on the schedule next Thursday at noon.

## 2017-03-02 ENCOUNTER — Ambulatory Visit (INDEPENDENT_AMBULATORY_CARE_PROVIDER_SITE_OTHER): Payer: Medicare Other | Admitting: Family Medicine

## 2017-03-02 ENCOUNTER — Encounter: Payer: Self-pay | Admitting: Family Medicine

## 2017-03-02 DIAGNOSIS — R0789 Other chest pain: Secondary | ICD-10-CM
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
## 2017-03-08 ENCOUNTER — Telehealth: Payer: Self-pay | Admitting: Urology

## 2017-03-08 NOTE — Telephone Encounter (Signed)
Pt called office asking to have his Cialis rx back to the 10mg  instead of the 20mg .  States he can't cut the 20mg . Please advise. Thanks. [phone]

## 2017-03-20 ENCOUNTER — Other Ambulatory Visit: Payer: Self-pay | Admitting: Urology

## 2017-03-20 MED ORDER — TADALAFIL 10 MG PO TABS: 10.0000 mg | ORAL_TABLET | Freq: Every day | ORAL | 6 refills | Status: DC | PRN

## 2017-03-20 NOTE — Progress Notes (Signed)
Script sent to pharmacy for the Cialis 10 mg.

## 2017-03-21 NOTE — Telephone Encounter (Signed)
Are you ok with medication being changed?

## 2017-03-21 NOTE — Telephone Encounter (Signed)
Pt called and said you need to call [phone] [facility], this is the appeals dept let them know pt has been doing very well on this medication .5 Tadalafil.  The pt is on no other therapy but this, or this will not be approved.  Then they will give you fax number to send in so pt can get meds.  If you have any questions, please give pt a call [phone]

## 2017-03-21 NOTE — Telephone Encounter (Signed)
Yes

## 2017-03-22 MED ORDER — TADALAFIL 10 MG PO TABS: 10.0000 mg | ORAL_TABLET | Freq: Every day | ORAL | 6 refills | Status: DC | PRN

## 2017-03-22 NOTE — Telephone Encounter (Signed)
Medication sent to [facility] 

## 2017-03-25 ENCOUNTER — Ambulatory Visit
Admission: RE | Admit: 2017-03-25 | Discharge: 2017-03-25 | Disposition: A | Discharge status: Home / Self Care | Payer: Medicare Other | Source: Ambulatory Visit | Attending: Family Medicine | Admitting: Family Medicine

## 2017-03-25 DIAGNOSIS — M5412 Radiculopathy, cervical region: Secondary | ICD-10-CM

## 2017-03-29 ENCOUNTER — Other Ambulatory Visit: Payer: Self-pay | Admitting: Family Medicine

## 2017-03-29 NOTE — Telephone Encounter (Signed)
Okay to refill Xanax? Last written on: 01/12/17 #20 with no refills.  LOV: 03/02/17 NOV: 04/24/17

## 2017-03-31 ENCOUNTER — Other Ambulatory Visit: Payer: Self-pay | Admitting: Family Medicine

## 2017-04-03 ENCOUNTER — Telehealth: Payer: Self-pay | Admitting: Urology

## 2017-04-03 NOTE — Telephone Encounter (Signed)
Pt still having problems getting Rx approved.  Pt asking to actually speak to someone to explain what the pharmacy told him what's needed to get the Rx approved.  Please call pt and advise.  Thanks.

## 2017-04-07 MED ORDER — TADALAFIL 5 MG PO TABS: 5.0000 mg | ORAL_TABLET | Freq: Every day | ORAL | 0 refills | Status: DC | PRN

## 2017-04-07 NOTE — Telephone Encounter (Signed)
Spoke with pt in reference to medications. Pt stated that insurance will cover cialis 5mg  for BPH. New script was sent to pharmacy for pt. Pt voiced understanding.

## 2017-04-19 ENCOUNTER — Telehealth: Payer: Self-pay

## 2017-04-19 NOTE — Telephone Encounter (Signed)
Cialis 5mg  was APPROVED! Ref #- [id number]

## 2017-04-22 ENCOUNTER — Other Ambulatory Visit: Payer: Self-pay | Admitting: Family Medicine

## 2017-04-24 ENCOUNTER — Other Ambulatory Visit: Payer: Self-pay

## 2017-04-24 ENCOUNTER — Ambulatory Visit (INDEPENDENT_AMBULATORY_CARE_PROVIDER_SITE_OTHER): Payer: Medicare Other | Admitting: Family Medicine

## 2017-04-24 ENCOUNTER — Encounter: Payer: Self-pay | Admitting: Family Medicine

## 2017-04-24 VITALS — BP 120/80 | HR 72 | Temp 97.5°F | Wt 198.6 lb

## 2017-04-24 DIAGNOSIS — Z8679 Personal history of other diseases of the circulatory system: Secondary | ICD-10-CM

## 2017-04-24 DIAGNOSIS — N401 Enlarged prostate with lower urinary tract symptoms: Secondary | ICD-10-CM

## 2017-04-24 DIAGNOSIS — M5410 Radiculopathy, site unspecified: Secondary | ICD-10-CM | POA: Diagnosis not present

## 2017-04-24 DIAGNOSIS — G8929 Other chronic pain: Secondary | ICD-10-CM

## 2017-04-24 DIAGNOSIS — I10 Essential (primary) hypertension: Secondary | ICD-10-CM | POA: Diagnosis not present

## 2017-04-24 DIAGNOSIS — R3916 Straining to void: Secondary | ICD-10-CM | POA: Diagnosis not present

## 2017-04-24 DIAGNOSIS — M542 Cervicalgia: Secondary | ICD-10-CM | POA: Diagnosis not present

## 2017-04-24 DIAGNOSIS — R7309 Other abnormal glucose: Secondary | ICD-10-CM | POA: Diagnosis not present

## 2017-04-24 DIAGNOSIS — R972 Elevated prostate specific antigen [PSA]: Secondary | ICD-10-CM

## 2017-04-24 LAB — BASIC METABOLIC PANEL
BUN: 14 mg/dL (ref 6–23)
CO2: 30 mEq/L (ref 19–32)
Calcium: 9.8 mg/dL (ref 8.4–10.5)
Chloride: 102 mEq/L (ref 96–112)
Creatinine, Ser: 0.9 mg/dL (ref 0.40–1.50)
GFR: 91.18 mL/min (ref >60.00)
Glucose, Bld: 97 mg/dL (ref 70–99)
Potassium: 4.3 mEq/L (ref 3.5–5.1)
Sodium: 139 mEq/L (ref 135–145)

## 2017-04-24 LAB — HEMOGLOBIN A1C: Hgb A1c MFr Bld: 5.8 % (ref 4.6–6.5)

## 2017-04-24 NOTE — Assessment & Plan Note (Signed)
Patient with radiculopathy.  Needs MRI.  We will attempt to get this rescheduled.  He will likely need to see neurosurgery and a referral was placed.  He has a benign exam today.  He is given return precautions.

## 2017-04-24 NOTE — Assessment & Plan Note (Signed)
No recurrence.  He will monitor. 

## 2017-04-24 NOTE — Assessment & Plan Note (Signed)
Adequately controlled.  Continue current medication. 

## 2017-04-24 NOTE — Assessment & Plan Note (Signed)
Has trended down.  He will continue to follow with urology.

## 2017-04-24 NOTE — Assessment & Plan Note (Signed)
Recheck glucose and A1c 

## 2017-04-24 NOTE — Assessment & Plan Note (Signed)
Now back on Cialis.  He is doing quite well.  He will continue to follow with urology.
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
## 2017-04-24 NOTE — Patient Instructions (Signed)
Nice to see you. We will try to get you set up with a different MRI.  We will have you see neurosurgery as well. We will check some lab work today and contact you with results.

## 2017-05-02 ENCOUNTER — Telehealth: Payer: Self-pay | Admitting: Family Medicine

## 2017-05-02 DIAGNOSIS — M5412 Radiculopathy, cervical region: Secondary | ICD-10-CM

## 2017-05-02 NOTE — Telephone Encounter (Signed)
I placed another order.  He may just need to take antianxiety medication prior to the MRI though we may need to consider oral sedation.  Please check to see what radiology needs from [place] regarding this.  Thanks.

## 2017-05-02 NOTE — Telephone Encounter (Signed)
Spoke to pt and he is willing to do a sedated MRI. Please enter order for this and I will call to schedule.

## 2017-05-03 NOTE — Telephone Encounter (Signed)
Noted. I will have [name] contact the patient to see if he thinks he would be able to do the MRI with anti-anxiety meds or if he took those already with prior attempts. If he does not think this will work I will order for sedation. Thanks.

## 2017-05-03 NOTE — Telephone Encounter (Signed)
Noted. Order placed for MRI with sedation. Forwarding back to [place].

## 2017-05-03 NOTE — Telephone Encounter (Signed)
Patient states he tried that and it did not work, he would like the sedation

## 2017-05-03 NOTE — Telephone Encounter (Signed)
You will just need to order the MRI and where it ask regarding sedation, you will need to enter the sedation. They will fax over a H&P to fill out for him that will need to be filled out prior to his appointment.

## 2017-05-08 ENCOUNTER — Other Ambulatory Visit: Payer: Self-pay

## 2017-05-08 ENCOUNTER — Other Ambulatory Visit: Payer: Self-pay | Admitting: Family Medicine

## 2017-05-12 ENCOUNTER — Other Ambulatory Visit: Payer: Self-pay | Admitting: Family Medicine

## 2017-05-12 NOTE — Telephone Encounter (Signed)
Last OV 04/24/17 last filled 02/08/17 90 0rf

## 2017-05-22 IMAGING — MR MR HEAD WO/W CM
10 of 12 series · 36 of 48 positions shown · IV contrast (18ml Multihance)
Comparison: None.

CLINICAL DATA: Episodic lightheadedness.  Right eye pain.

EXAM:
MRI HEAD WITHOUT AND WITH CONTRAST
TECHNIQUE: Multiplanar, multiecho pulse sequences of the brain and surrounding
structures were obtained without and with intravenous contrast.
CONTRAST:  18mL MULTIHANCE GADOBENATE DIMEGLUMINE 529 MG/ML IV SOLN

[Series 2: T1 · sagittal · 5.0mm · 0.45mm/px · 3 of 21 slices shown]
[im 1/21]
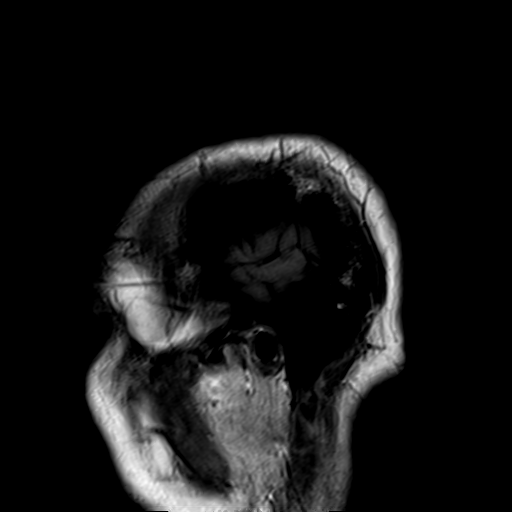
[im 11/21]
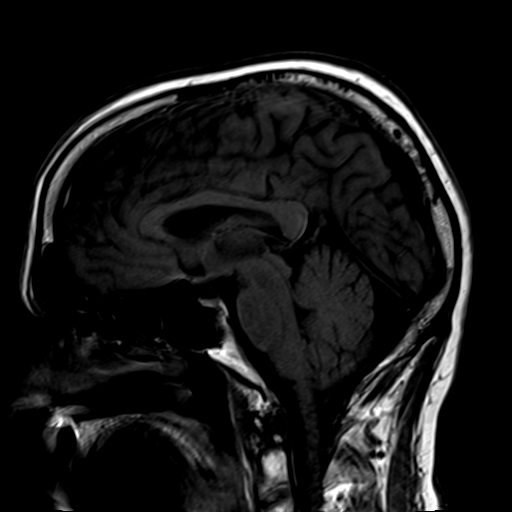
[im 21/21]
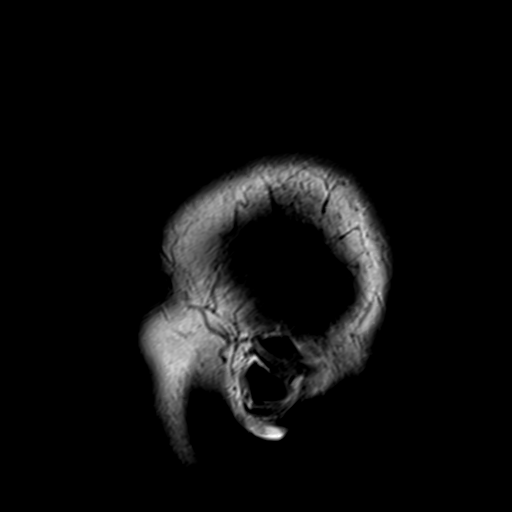

[Series 3: ax ep2d_diff_(id)_trace · axial · 3.0mm · 1.80mm/px · z∈[-27,+120]mm · 8 of 98 slices shown]
[im 1/98]
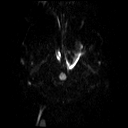
[im 14/98]
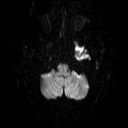
[im 28/98]
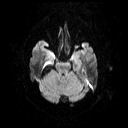
[im 42/98]
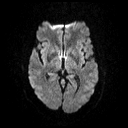
[im 56/98]
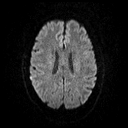
[im 70/98]
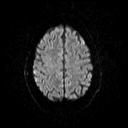
[im 84/98]
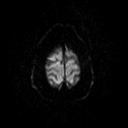
[im 98/98]
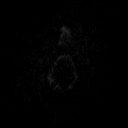

[Series 4: ax ep2d_diff_(id)_trace_adc · axial · 3.0mm · 1.80mm/px · z∈[-27,+120]mm · 4 of 46 slices shown]
[im 1/46]
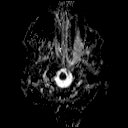
[im 16/46]
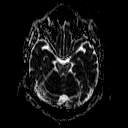
[im 31/46]
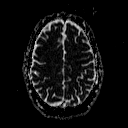
[im 46/46]
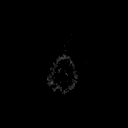

[Series 5: cor ep2d_diff · coronal · 5.0mm · 1.77mm/px · 4 of 50 slices shown]
[im 1/50]
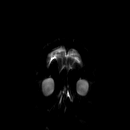
[im 17/50]
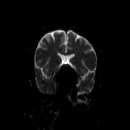
[im 33/50]
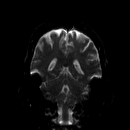
[im 50/50]
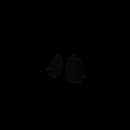

[Series 6: cor ep2d_diff_adc · coronal · 5.0mm · 1.77mm/px · 2 of 25 slices shown]
[im 1/25]
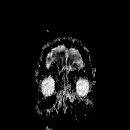
[im 25/25]
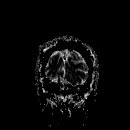

[Series 8: swi_images · axial · 2.0mm · 0.90mm/px · z∈[-33,+125]mm · 7 of 80 slices shown]
[im 1/80]
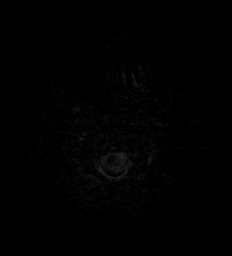
[im 14/80]
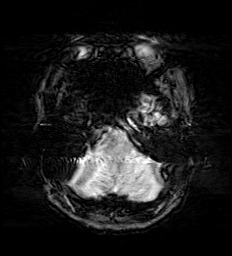
[im 27/80]
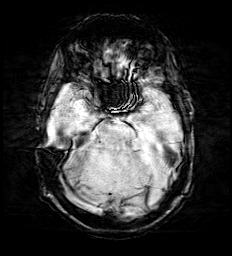
[im 40/80]
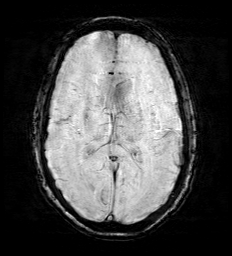
[im 53/80]
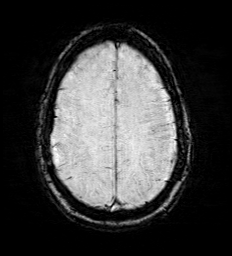
[im 66/80]
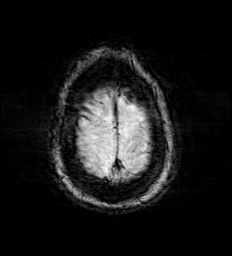
[im 80/80]
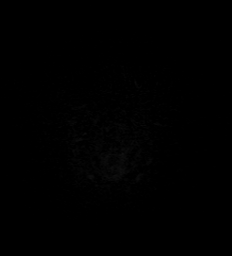

[Series 9: FLAIR · axial · 5.0mm · 0.45mm/px · z∈[-29,+121]mm · 2 of 25 slices shown]
[im 1/25]
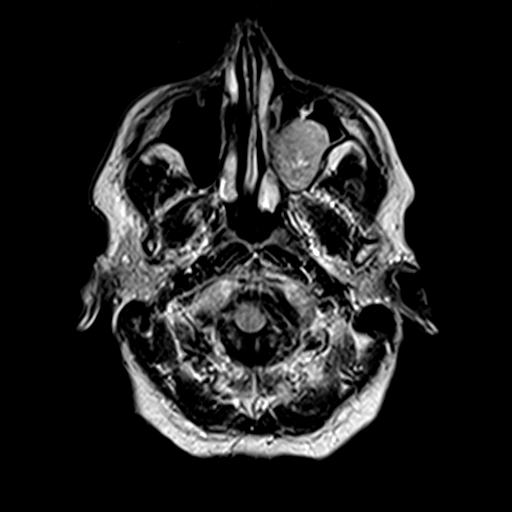
[im 25/25]
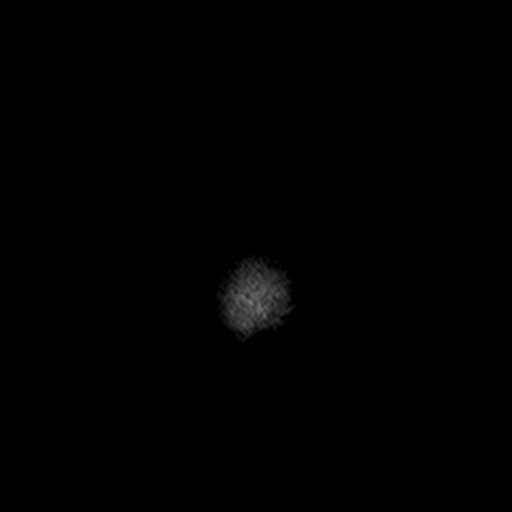

[Series 10: T2 · axial · 5.0mm · 0.60mm/px · z∈[-29,+121]mm · 2 of 24 slices shown (1 of 2)]
[im 1/24]
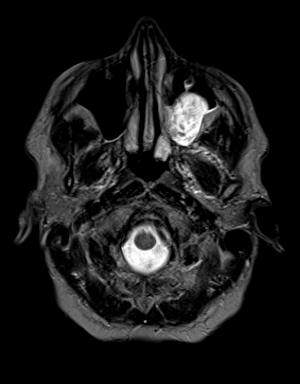
[im 24/24]
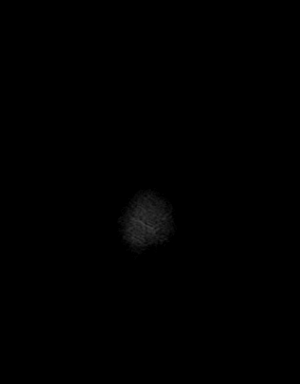

[Series 12: T2 · coronal · 5.0mm · 0.45mm/px · 2 of 27 slices shown (2 of 2)]
[im 1/27]
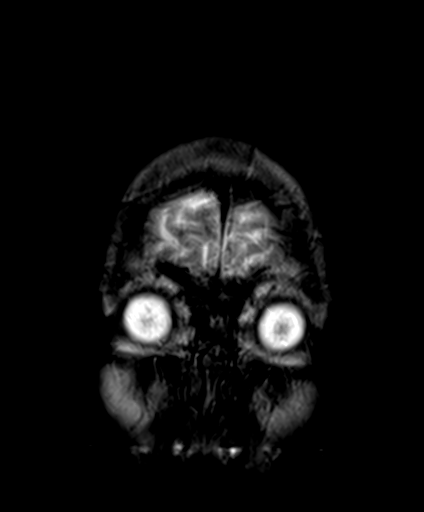
[im 27/27]
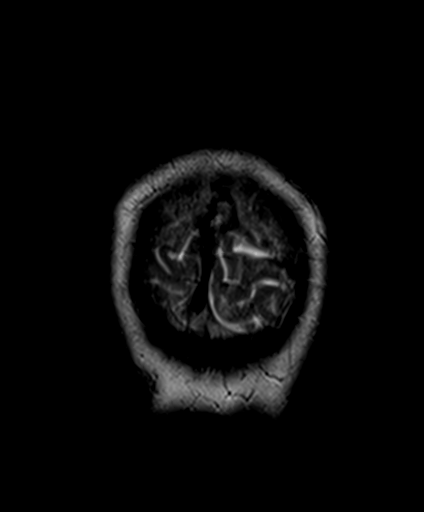

[Series 13: +c cor · coronal · 5.0mm · 0.72mm/px · 2 of 27 slices shown]
[im 1/27]
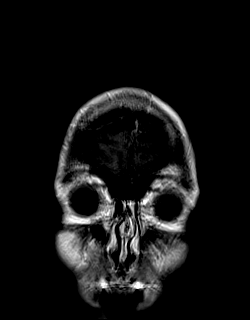
[im 27/27]
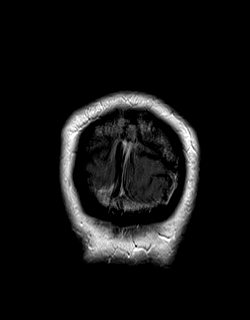

[36 of 48 positions shown; findings below may reference images not displayed]

FINDINGS: The patient was claustrophobic. Despite medication the patient was
not able to hold still with progressive motion degraded images
through the throughout the study.

Ventricle size normal.  Cerebral volume normal.

Negative for acute or chronic ischemia.

Negative for demyelinating disease. Cerebral white matter normal.
Basal ganglia and brainstem normal.

Negative for hemorrhage or fluid collection.

Negative for mass or edema.

Postcontrast imaging degraded by motion. No enhancing mass lesion.
Normal dural venous sinus enhancement.

Mucosal edema in the paranasal sinuses. Retention cyst left
maxillary sinus with complex internal contents. Normal orbital
structures. Pituitary not enlarged.
IMPRESSION: Image quality degraded by motion. No significant intracranial
abnormality

Mucosal disease in the maxillary sinuses bilaterally left greater
than right.

## 2017-05-30 ENCOUNTER — Ambulatory Visit (HOSPITAL_COMMUNITY): Payer: Medicare Other

## 2017-06-02 NOTE — Progress Notes (Signed)
Called MRI to get a copy of pt's H&P. Spoke with MRI scheduling and pt has cancelled the MRI. I called OR and spoke with Alyse Low and she will take pt off th OR schedule

## 2017-06-06 ENCOUNTER — Encounter (HOSPITAL_COMMUNITY): Payer: Self-pay

## 2017-06-06 ENCOUNTER — Ambulatory Visit (HOSPITAL_COMMUNITY): Payer: Medicare Other

## 2017-06-06 ENCOUNTER — Ambulatory Visit (HOSPITAL_COMMUNITY): Admission: RE | Admit: 2017-06-06 | Payer: Medicare Other | Source: Ambulatory Visit | Admitting: Internal Medicine

## 2017-06-06 ENCOUNTER — Encounter (HOSPITAL_COMMUNITY): Admission: RE | Payer: Self-pay | Source: Ambulatory Visit

## 2017-06-06 SURGERY — MRI WITH ANESTHESIA
Anesthesia: General

## 2017-06-30 ENCOUNTER — Other Ambulatory Visit: Payer: Self-pay | Admitting: Family Medicine

## 2017-06-30 NOTE — Telephone Encounter (Signed)
Last OV 04/24/17 last filled Flexeril 02/10/17 90 0rf Alprazolam 03/30/17 20 0rf lexapro  03/31/17 90 0rf

## 2017-07-04 ENCOUNTER — Other Ambulatory Visit: Payer: Self-pay | Admitting: Gastroenterology

## 2017-07-13 ENCOUNTER — Other Ambulatory Visit: Payer: Self-pay | Admitting: Urology

## 2017-07-17 ENCOUNTER — Telehealth: Payer: Self-pay

## 2017-07-17 DIAGNOSIS — M5412 Radiculopathy, cervical region: Secondary | ICD-10-CM

## 2017-07-17 NOTE — Telephone Encounter (Signed)
Copied from [place]. Topic: Referral - Request >> Jul 17, 2017  3:24 PM Oliver Pila B wrote: Reason for CRM: pt called and is asking for another referral for a MRI but is needing a standing MRI, contact pt to advise; pt does not currently have a facility in mind on where to go

## 2017-07-17 NOTE — Telephone Encounter (Signed)
Please advise 

## 2017-07-17 NOTE — Telephone Encounter (Signed)
Please contact the patient to find out why he did not go through with the sedated MRI that was previously scheduled.  I am happy to reorder this though we are having a difficult time getting this done given his anxiety.  He may need to see an orthopedic surgeon for his neck.

## 2017-07-18 NOTE — Telephone Encounter (Signed)
MRI is been ordered.  I will forward to [facility] as well to get him set up for a stand-up MRI if possible.

## 2017-07-18 NOTE — Telephone Encounter (Signed)
He states the anesthesia was too expensive for the MRI. Patient states he did some research and was informed there is a stand up mri and he would like to try that.

## 2017-07-18 NOTE — Addendum Note (Signed)
Addended by: [name] on: 07/18/2017 03:48 PM   Modules accepted: Orders

## 2017-07-20 ENCOUNTER — Other Ambulatory Visit: Payer: Self-pay | Admitting: Family Medicine

## 2017-07-21 ENCOUNTER — Encounter (INDEPENDENT_AMBULATORY_CARE_PROVIDER_SITE_OTHER): Payer: Self-pay

## 2017-08-03 ENCOUNTER — Other Ambulatory Visit: Payer: Self-pay | Admitting: Family Medicine

## 2017-08-17 ENCOUNTER — Ambulatory Visit: Payer: Medicare Other

## 2017-09-23 ENCOUNTER — Ambulatory Visit
Admission: RE | Admit: 2017-09-23 | Discharge: 2017-09-23 | Disposition: A | Discharge status: Home / Self Care | Payer: Medicare Other | Source: Ambulatory Visit | Attending: Family Medicine | Admitting: Family Medicine

## 2017-09-23 DIAGNOSIS — M5412 Radiculopathy, cervical region: Secondary | ICD-10-CM

## 2017-09-29 ENCOUNTER — Other Ambulatory Visit: Payer: Self-pay | Admitting: Family Medicine

## 2017-09-29 DIAGNOSIS — M5412 Radiculopathy, cervical region: Secondary | ICD-10-CM

## 2017-09-29 NOTE — Progress Notes (Signed)
n

## 2017-10-12 DIAGNOSIS — M5412 Radiculopathy, cervical region: Secondary | ICD-10-CM | POA: Diagnosis not present

## 2017-10-12 DIAGNOSIS — G8929 Other chronic pain: Secondary | ICD-10-CM | POA: Diagnosis not present

## 2017-10-12 DIAGNOSIS — M5442 Lumbago with sciatica, left side: Secondary | ICD-10-CM | POA: Diagnosis not present

## 2017-10-16 ENCOUNTER — Telehealth: Payer: Self-pay

## 2017-10-16 NOTE — Telephone Encounter (Signed)
Copied from [place] [id number]. Topic: Referral - Request >> Oct 16, 2017  9:29 AM Antonieta Iba C wrote: Reason for CRM: requesting a referral to GI at the hospital   CB [phone]

## 2017-10-16 NOTE — Progress Notes (Signed)
[facility] Consultation      Assessment and Plan:  Cough -Improved with claritin-D but dried him out too much, recommend regular daily claritin, and nasal steroid.    Obstructive sleep apnea --Sleep study 01/05/16, AHI equals 60. --Patient was not interested in CPAP due to severe claustrophobia, therefore he was referred for dental device.  --If not improved or covered would consider referral to ENT for UPPP.   GERD.  --Continue protonix, he is being referred to see gastroenterology.   Atrial Fibrillation.  -Sleep apnea may be contributing to atrial fibrillation s/p ablation.    Depression/Anxiety.  -Patient has been on Lexapro which may be contributing to daytime sleepiness.  Claustrophobia -Patient has severe claustrophobia and is not interested in starting CPAP.  Sleep-related bruxism. --Does not wear device.   Date: 10/16/2017  MRN# [id number] [name]. 1955/08/10  Referring Physician:   [name] is a 61 y.o. old male seen in consultation for chief complaint of:    Chief Complaint  Patient presents with  . Cough    pt still has dry cough.He d/c'd Claritin due to mouth dryness.  . Sleep Apnea    not on cpap    HPI:   The patient is a 62 year old male with severe sleep apnea, atrial fibrillation. He was diagnosed with obstructive sleep apnea, but declined use of CPAP.  At last visit he was having cough which had been persistent a year.  He was asked to continue Nasacort, use Protonix daily, start Claritin-D daily. He felt that the claritin-D worked and the cough was significantly improved, but it dried his mouth out too much so he stopped it.  He is not using nasal sprays.  He has significant gerd symptoms and has trouble laying down at night. He continues to take protonix daily.    Chest x-ray 02/22/2017>> images personally reviewed, mild hyperinflation, changes of chronic bronchitis, otherwise lungs are  unremarkable.  Medication:    Current Outpatient Medications:  .  ALPRAZolam (XANAX) 0.5 MG tablet, TAKE 1 TABLET BY MOUTH EVERY NIGHT AT BEDTIME, AS NEEDED FOR ANXIETY, Disp: 20 tablet, Rfl: 0 .  atorvastatin (LIPITOR) 10 MG tablet, TAKE 1 TABLET(10 MG) BY MOUTH EVERY MORNING, Disp: 90 tablet, Rfl: 0 .  cyclobenzaprine (FLEXERIL) 10 MG tablet, TAKE 1 TABLET(10 MG) BY MOUTH THREE TIMES DAILY AS NEEDED FOR MUSCLE SPASMS, Disp: 90 tablet, Rfl: 0 .  escitalopram (LEXAPRO) 10 MG tablet, TAKE 1 TABLET(10 MG) BY MOUTH DAILY, Disp: 90 tablet, Rfl: 1 .  finasteride (PROSCAR) 5 MG tablet, Take 1 tablet (5 mg total) by mouth daily., Disp: 90 tablet, Rfl: 3 .  ibuprofen (ADVIL,MOTRIN) 200 MG tablet, Take 200-400 mg by mouth daily as needed for headache or moderate pain., Disp: , Rfl:  .  loratadine (CLARITIN) 10 MG tablet, Take 10 mg by mouth daily as needed for allergies., Disp: , Rfl:  .  losartan (COZAAR) 50 MG tablet, Take 1 tablet (50 mg total) by mouth daily., Disp: 90 tablet, Rfl: 3 .  meloxicam (MOBIC) 15 MG tablet, TAKE 1 TABLET(15 MG) BY MOUTH DAILY AS NEEDED FOR PAIN, Disp: 90 tablet, Rfl: 1 .  pantoprazole (PROTONIX) 40 MG tablet, TAKE 1 TABLET(40 MG) BY MOUTH DAILY, Disp: 90 tablet, Rfl: 0 .  Probiotic Product (ALIGN) 4 MG CAPS, Take 1 capsule by mouth daily (Patient taking differently: Take 1 capsule by mouth daily as needed. For bowel regularity), Disp: 30 capsule, Rfl: 3 .  tadalafil (CIALIS)  5 MG tablet, Take 1 tablet (5 mg total) by mouth daily., Disp: 90 tablet, Rfl: 3 .  triamcinolone (NASACORT) 55 MCG/ACT AERO nasal inhaler, Place 2 sprays into the nose daily as needed (allergies). , Disp: , Rfl:  .  TRULANCE 3 MG TABS, Take 3 mg by mouth at bedtime as needed for constipation., Disp: , Rfl: 6 .  TRULANCE 3 MG TABS, TAKE 1 TABLET BY MOUTH EVERY DAY, Disp: 30 tablet, Rfl: 3   Allergies:  Other  Review of Systems:  Constitutional: Feels well. Cardiovascular: No chest pain.   Pulmonary: Denies dyspnea.   The remainder of systems were reviewed and were found to be negative other than what is documented in the HPI.   Physical Examination:   VS: BP 114/70 (BP Location: Left Arm, Cuff Size: Large)   Pulse 65   Resp 16   Ht 5\' 10"  (1.778 m)   Wt 199 lb (90.3 kg)   SpO2 98%   BMI 28.55 kg/m   General Appearance: No distress  Neuro:without focal findings, mental status, speech normal, alert and oriented HEENT: PERRLA, EOM intact Pulmonary: No wheezing, No rales  CardiovascularNormal S1,S2.  No m/r/g.  Abdomen: Benign, Soft, non-tender, No masses Renal:  No costovertebral tenderness  GU:  No performed at this time. Endoc: No evident thyromegaly, no signs of acromegaly or Cushing features Skin:   warm, no rashes, no ecchymosis  Extremities: normal, no cyanosis, clubbing.      LABORATORY PANEL:   CBC No results for input(s): WBC, HGB, HCT, PLT in the last 168 hours. ------------------------------------------------------------------------------------------------------------------  Chemistries  No results for input(s): NA, K, CL, CO2, GLUCOSE, BUN, CREATININE, CALCIUM, MG, AST, ALT, ALKPHOS, BILITOT in the last 168 hours.  Invalid input(s): GFRCGP ------------------------------------------------------------------------------------------------------------------  Cardiac Enzymes No results for input(s): TROPONINI in the last 168 hours. ------------------------------------------------------------  RADIOLOGY:  No results found.     Thank  you for the consultation and for allowing [facility] to assist in the care of your patient. Our recommendations are noted above.  Please contact us if we can be of further service.  Marda Stalker, M.D., F.C.C.P.  Board Certified in Internal Medicine, Pulmonary Medicine, [place], and Sleep Medicine.  [facility] Office Number:  [phone]   10/16/2017

## 2017-10-17 ENCOUNTER — Ambulatory Visit (INDEPENDENT_AMBULATORY_CARE_PROVIDER_SITE_OTHER): Payer: Medicare Other | Admitting: Internal Medicine

## 2017-10-17 ENCOUNTER — Encounter: Payer: Self-pay | Admitting: Internal Medicine

## 2017-10-17 VITALS — BP 114/70 | HR 65 | Resp 16 | Ht 70.0 in | Wt 199.0 lb

## 2017-10-17 DIAGNOSIS — R05 Cough: Secondary | ICD-10-CM

## 2017-10-17 DIAGNOSIS — I480 Paroxysmal atrial fibrillation: Secondary | ICD-10-CM | POA: Diagnosis not present

## 2017-10-17 DIAGNOSIS — Z23 Encounter for immunization: Secondary | ICD-10-CM | POA: Diagnosis not present

## 2017-10-17 DIAGNOSIS — G4719 Other hypersomnia: Secondary | ICD-10-CM

## 2017-10-17 DIAGNOSIS — R059 Cough, unspecified: Secondary | ICD-10-CM

## 2017-10-17 DIAGNOSIS — G4733 Obstructive sleep apnea (adult) (pediatric): Secondary | ICD-10-CM | POA: Diagnosis not present

## 2017-10-17 NOTE — Patient Instructions (Addendum)
Recommend that you use nasal spray such as nasonex or flonase daily.  Recommend that you use claritin daily.  Flu shot today.  Will refer to dentist for device to treat sleep apnea. If this not covered or does not improve, we will need to refer you to an ENT doctor for surgery.

## 2017-10-19 NOTE — Telephone Encounter (Addendum)
Pt states he is constantly burping and has a lot of gas and his orthopedic dr recommended a GI dr at the hospital that is good.  Pt wants a second opinion and not see his previous GI dr. Pt aware Dr is out of the office and pt has appt 9/25 at 8 am

## 2017-10-22 ENCOUNTER — Other Ambulatory Visit: Payer: Self-pay | Admitting: Family Medicine

## 2017-10-29 ENCOUNTER — Other Ambulatory Visit: Payer: Self-pay | Admitting: Family Medicine

## 2017-11-01 ENCOUNTER — Ambulatory Visit (INDEPENDENT_AMBULATORY_CARE_PROVIDER_SITE_OTHER): Payer: Medicare Other | Admitting: Family Medicine

## 2017-11-01 ENCOUNTER — Encounter: Payer: Self-pay | Admitting: Family Medicine

## 2017-11-01 VITALS — BP 116/80 | HR 60 | Temp 98.2°F | Ht 70.0 in | Wt 199.8 lb

## 2017-11-01 DIAGNOSIS — M5442 Lumbago with sciatica, left side: Secondary | ICD-10-CM

## 2017-11-01 DIAGNOSIS — G8929 Other chronic pain: Secondary | ICD-10-CM

## 2017-11-01 DIAGNOSIS — R053 Chronic cough: Secondary | ICD-10-CM

## 2017-11-01 DIAGNOSIS — M5441 Lumbago with sciatica, right side: Secondary | ICD-10-CM

## 2017-11-01 DIAGNOSIS — M5412 Radiculopathy, cervical region: Secondary | ICD-10-CM | POA: Diagnosis not present

## 2017-11-01 DIAGNOSIS — I10 Essential (primary) hypertension: Secondary | ICD-10-CM

## 2017-11-01 DIAGNOSIS — F4323 Adjustment disorder with mixed anxiety and depressed mood: Secondary | ICD-10-CM | POA: Diagnosis not present

## 2017-11-01 DIAGNOSIS — R05 Cough: Secondary | ICD-10-CM

## 2017-11-01 DIAGNOSIS — R143 Flatulence: Secondary | ICD-10-CM

## 2017-11-01 LAB — BASIC METABOLIC PANEL
BUN: 13 mg/dL (ref 6–23)
CO2: 30 mEq/L (ref 19–32)
Calcium: 9.5 mg/dL (ref 8.4–10.5)
Chloride: 103 mEq/L (ref 96–112)
Creatinine, Ser: 0.86 mg/dL (ref 0.40–1.50)
GFR: 95.93 mL/min (ref >60.00)
Glucose, Bld: 89 mg/dL (ref 70–99)
Potassium: 4.3 mEq/L (ref 3.5–5.1)
Sodium: 139 mEq/L (ref 135–145)

## 2017-11-01 NOTE — Progress Notes (Signed)
Tyler Rumps, MD Phone: [phone]  [name]. is a 62 y.o. male who presents today for f/u.  CC: Neck and back pain, anxiety, chronic cough, excessive gas  Cervical and lumbar radiculopathy: Patient saw the neurosurgeon.  They felt the MRI was motion degraded enough that he should have a repeat.  He reports they advised that he may be able to get this for free given that it was so degraded previously.  He has claustrophobia and its difficult for him to have them done.  His prior MRIs have showed bulging disks and spinal stenosis.  He does not have radiation down his left arm to the ulnar aspect with some numbness in this area at times.  Similar radiation down his left leg.  These things have been chronic since 2007.  He notes chronic back and neck pain.  No incontinence or saddle anesthesia.  Anxiety: Notes this is so-so.  The Xanax is helpful and does not make him drowsy.  He continues on Lexapro.  He notes some mild intermittent depression though no SI.  Chronic cough: He notes he saw pulmonology and he has been on Claritin-D and Nasacort which has been beneficial and his cough has improved significantly.  He is also seen a dentist to try to get a mouth device for his sleep apnea.  Excessive gas: Patient saw GI previously and told he was swallowing air.  Gas comes up a lot at night through his mouth.  He also suffers from some constipation he takes the trulance for this though it gives him gas and diarrhea.  He notes no blood in his stool.  His reflux is under control with Protonix.  He would like a referral for a second opinion.  Social History   Tobacco Use  Smoking Status Former Smoker  . Years: 20.00  . Types: Cigarettes  . Last attempt to quit: 02/08/1991  . Years since quitting: 26.7  Smokeless Tobacco Never Used     ROS see history of present illness  Objective  Physical Exam Vitals:   11/01/17 0807  BP: 116/80  Pulse: 60  Temp: 98.2 F (36.8 C)  SpO2: 98%      BP Readings from Last 3 Encounters:  11/01/17 116/80  10/17/17 114/70  04/24/17 120/80   Wt Readings from Last 3 Encounters:  11/01/17 199 lb 12.8 oz (90.6 kg)  10/17/17 199 lb (90.3 kg)  04/24/17 198 lb 9.6 oz (90.1 kg)    Physical Exam  Constitutional: No distress.  Cardiovascular: Normal rate, regular rhythm and normal heart sounds.  Pulmonary/Chest: Effort normal and breath sounds normal.  Abdominal: Soft. Bowel sounds are normal. He exhibits no distension. There is no tenderness. There is no rebound and no guarding.  Musculoskeletal: He exhibits no edema.  No midline spine tenderness, no midline spine step-off, paraspinous muscle tenderness throughout his spine  Neurological: He is alert.  5/5 strength in bilateral biceps, triceps, grip, quads, hamstrings, plantar and dorsiflexion, sensation to light touch intact in bilateral UE and LE, normal gait, 2+ patellar reflexes  Skin: Skin is warm and dry. He is not diaphoretic.     Assessment/Plan: Please see individual problem list.  Cervical radiculopathy An additional MRI was ordered.  Our referral coordinator will check on possible coverage for this.  He will continue to see the neurosurgeon.  Adjustment disorder with mixed anxiety and depressed mood Doing relatively well.  He will continue his current regimen.  Chronic cough Improved with Claritin and Nasacort.  He will continue these and continue to see pulmonology.  Chronic low back pain MRI lumbar spine ordered.  He will continue to see the neurosurgeon.  Excessive gas We will refer to a different GI physician for a second opinion.  Hypertension Well-controlled.  BMP today.   Orders Placed This Encounter  Procedures  . MR Cervical Spine Wo Contrast    Standing Status:   Future    Standing Expiration Date:   01/02/2019    Order Specific Question:   What is the patient's sedation requirement?    Answer:   Anti-anxiety    Order Specific Question:   Does  the patient have a pacemaker or implanted devices?    Answer:   No    Order Specific Question:   Preferred imaging location?    Answer:   [facility] (table limit-550lbs)    Order Specific Question:   Radiology Contrast Protocol - do NOT remove file path    Answer:   \\[email or web address].PDF  . MR Lumbar Spine Wo Contrast    Standing Status:   Future    Standing Expiration Date:   01/02/2019    Order Specific Question:   What is the patient's sedation requirement?    Answer:   Anti-anxiety    Order Specific Question:   Does the patient have a pacemaker or implanted devices?    Answer:   No    Order Specific Question:   Preferred imaging location?    Answer:   [facility] (table limit-550lbs)    Order Specific Question:   Radiology Contrast Protocol - do NOT remove file path    Answer:   \\[email or web address].PDF  . Basic Metabolic Panel (BMET)  . Ambulatory referral to Gastroenterology    Referral Priority:   Routine    Referral Type:   Consultation    Referral Reason:   Specialty Services Required    Number of Visits Requested:   1    No orders of the defined types were placed in this encounter.    Tyler Rumps, MD [place]

## 2017-11-01 NOTE — Assessment & Plan Note (Signed)
An additional MRI was ordered.  Our referral coordinator will check on possible coverage for this.  He will continue to see the neurosurgeon.

## 2017-11-01 NOTE — Assessment & Plan Note (Signed)
Improved with Claritin and Nasacort.  He will continue these and continue to see pulmonology.

## 2017-11-01 NOTE — Assessment & Plan Note (Signed)
MRI lumbar spine ordered.  He will continue to see the neurosurgeon.

## 2017-11-01 NOTE — Assessment & Plan Note (Signed)
Doing relatively well.  He will continue his current regimen.

## 2017-11-01 NOTE — Assessment & Plan Note (Addendum)
Well controlled.  BMP today 

## 2017-11-01 NOTE — Patient Instructions (Signed)
Nice to see you. We will get you to see GI. We will try to get your MRIs scheduled and completed. Please follow-up with your dentist regarding your oral device for sleep apnea. If you develop worsening anxiety or depression please let us know.

## 2017-11-01 NOTE — Assessment & Plan Note (Signed)
We will refer to a different GI physician for a second opinion.

## 2017-11-05 ENCOUNTER — Other Ambulatory Visit: Payer: Self-pay | Admitting: Urology

## 2017-11-05 ENCOUNTER — Other Ambulatory Visit: Payer: Self-pay | Admitting: Family Medicine

## 2017-11-05 DIAGNOSIS — R972 Elevated prostate specific antigen [PSA]: Secondary | ICD-10-CM

## 2017-11-06 NOTE — Telephone Encounter (Signed)
Last OV 11/01/2017   Flexeril last refilled 07/01/2017 disp 90 with no refills   mobic last refilled 05/12/2017 disp 90 with 1 refill   Sent to PCP to advise

## 2017-11-07 DIAGNOSIS — K5904 Chronic idiopathic constipation: Secondary | ICD-10-CM | POA: Diagnosis not present

## 2017-11-07 DIAGNOSIS — R142 Eructation: Secondary | ICD-10-CM | POA: Diagnosis not present

## 2017-11-09 ENCOUNTER — Other Ambulatory Visit: Payer: Self-pay | Admitting: Family Medicine

## 2017-11-09 NOTE — Telephone Encounter (Signed)
Last OV 11/01/2017   Last refilled 06/29/2017 disp 20 with no refills   Sent to PCP to advise

## 2017-11-10 ENCOUNTER — Ambulatory Visit: Payer: Medicare Other | Admitting: Family Medicine

## 2017-11-10 NOTE — Telephone Encounter (Signed)
Controlled substance database reviewed.  Refill sent to pharmacy. 

## 2017-11-15 ENCOUNTER — Other Ambulatory Visit: Payer: Self-pay | Admitting: Family Medicine

## 2017-11-15 DIAGNOSIS — R972 Elevated prostate specific antigen [PSA]: Secondary | ICD-10-CM

## 2017-11-18 ENCOUNTER — Ambulatory Visit
Admission: RE | Admit: 2017-11-18 | Discharge: 2017-11-18 | Disposition: A | Discharge status: Home / Self Care | Payer: Medicare Other | Source: Ambulatory Visit | Attending: Family Medicine | Admitting: Family Medicine

## 2017-11-18 DIAGNOSIS — M5412 Radiculopathy, cervical region: Secondary | ICD-10-CM

## 2017-11-18 DIAGNOSIS — M5442 Lumbago with sciatica, left side: Principal | ICD-10-CM

## 2017-11-18 DIAGNOSIS — M5441 Lumbago with sciatica, right side: Principal | ICD-10-CM

## 2017-11-18 DIAGNOSIS — M545 Low back pain: Secondary | ICD-10-CM | POA: Diagnosis not present

## 2017-11-18 DIAGNOSIS — G8929 Other chronic pain: Secondary | ICD-10-CM

## 2017-11-18 DIAGNOSIS — M542 Cervicalgia: Secondary | ICD-10-CM | POA: Diagnosis not present

## 2017-11-20 ENCOUNTER — Other Ambulatory Visit: Payer: Medicare Other

## 2017-11-20 DIAGNOSIS — R972 Elevated prostate specific antigen [PSA]: Secondary | ICD-10-CM | POA: Diagnosis not present

## 2017-11-21 LAB — PSA: Prostate Specific Ag, Serum: 1.6 ng/mL (ref 0.0–4.0)

## 2017-11-22 ENCOUNTER — Encounter: Payer: Self-pay | Admitting: Urology

## 2017-11-22 ENCOUNTER — Ambulatory Visit (INDEPENDENT_AMBULATORY_CARE_PROVIDER_SITE_OTHER): Payer: Medicare Other | Admitting: Urology

## 2017-11-22 VITALS — BP 151/76 | HR 67 | Ht 70.0 in | Wt 200.2 lb

## 2017-11-22 DIAGNOSIS — N401 Enlarged prostate with lower urinary tract symptoms: Secondary | ICD-10-CM

## 2017-11-22 DIAGNOSIS — R351 Nocturia: Secondary | ICD-10-CM | POA: Diagnosis not present

## 2017-11-22 DIAGNOSIS — N3281 Overactive bladder: Secondary | ICD-10-CM

## 2017-11-22 DIAGNOSIS — N529 Male erectile dysfunction, unspecified: Secondary | ICD-10-CM

## 2017-11-22 DIAGNOSIS — N138 Other obstructive and reflux uropathy: Secondary | ICD-10-CM

## 2017-11-22 DIAGNOSIS — R972 Elevated prostate specific antigen [PSA]: Secondary | ICD-10-CM | POA: Diagnosis not present

## 2017-11-22 MED ORDER — FINASTERIDE 5 MG PO TABS: 5.0000 mg | ORAL_TABLET | Freq: Every day | ORAL | 3 refills | Status: DC

## 2017-11-22 NOTE — Progress Notes (Signed)
8:51 AM   [name]. 08/10/1955 [id number]  Referring provider: [name], MD [address] [place], [place] [address]  No chief complaint on file.   HPI: Patient is a 62 year old WM with BPH, OAB, nocturia, PFD and ED who presents today for one year follow up.  BPH WITH LUTS  (prostate and/or bladder) IPSS score: 20/3     Previous score: 20/5   Previous PVR: 18 mL  Major complaint(s): Frequency, nocturia, intermittency and a weak urinary stream  x several  years. Denies any dysuria, hematuria or suprapubic pain.   Currently taking: Finasteride 5 mg daily and Cialis 5 mg daily  Denies any recent fevers, chills, nausea or vomiting.  He does not have a family history of PCa.  IPSS    Row Name 11/22/17 0800         International Prostate Symptom Score   How often have you had the sensation of not emptying your bladder?  About half the time     How often have you had to urinate less than every two hours?  More than half the time     How often have you found you stopped and started again several times when you urinated?  Less than half the time     How often have you found it difficult to postpone urination?  About half the time     How often have you had a weak urinary stream?  About half the time     How often have you had to strain to start urination?  Less than half the time     How many times did you typically get up at night to urinate?  3 Times     Total IPSS Score  20       Quality of Life due to urinary symptoms   If you were to spend the rest of your life with your urinary condition just the way it is now how would you feel about that?  Mixed        Score:  1-7 Mild 8-19 Moderate 20-35 Severe  OAB Voiding is erratic.  He can sometimes go a few hours during the day without having to void and sometimes he has to go ten minutes after he voids.    Nocturia 3 to 4 times a night.   Cannot tolerate CPAP.  He is trying to get a dental  device approved through his insurance.[name]  PFD Did not attend PT as he found it cost prohibitive  Erectile dysfunction His SHIM score is 15, which is mild to moderate ED.   He has been having difficulty with erections for several years.   His major complaint is no sensation.  His libido is diminished.   His risk factors for ED are age, BPH, HTN, HLD, sleep apnea and depression.  He denies any painful erections.  He has noticed a recent curve in the head of the penis that points ventrally.  It does not interfere with intercourse.   He is still having spontaneous erections.  He has tried Cialis in the past.     [name] Name 11/22/17 0824         SHIM: Over the last 6 months:   How do you rate your confidence that you could get and keep an erection?  Moderate     When you had erections with sexual stimulation, how often were your erections hard enough  for penetration (entering your partner)?  Sometimes (about half the time)     During sexual intercourse, how often were you able to maintain your erection after you had penetrated (entered) your partner?  Sometimes (about half the time)     During sexual intercourse, how difficult was it to maintain your erection to completion of intercourse?  Slightly Difficult     When you attempted sexual intercourse, how often was it satisfactory for you?  A Few Times (much less than half the time)       SHIM Total Score   SHIM  15        Score: 1-7 Severe ED 8-11 Moderate ED 12-16 Mild-Moderate ED 17-21 Mild ED 22-25 No ED  PMH: Past Medical History:  Diagnosis Date  . Anxiety   . Arthritis   . BPH (benign prostatic hyperplasia) 11/19/2013  . 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 Bulging disc    C2/3, C3/4, C6/7  . Cervical disc herniation    C4/5 and C5/6  . Colon polyp 04/18/2011  . Constipation 08/31/2012  . Depression   . Diverticulosis   . 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 GERD  (gastroesophageal reflux disease)   . H/O diverticulitis of colon   . Hemorrhoid   . Hiatal hernia   . Hyperlipidemia   . Levoscoliosis   . Sleep difficulties   . Spinal stenosis in cervical region    cord abutment C4/5  . Testicular pain, left   . Typical atrial flutter ([place]) 11/2015   a. CHADS2VASc => 0; b. on Eliquis for pending ablation     Surgical History: Past Surgical History:  Procedure Laterality Date  . ATRIAL FLUTTER ABLATION  02/17/2016  . COLONOSCOPY  2014  . COLONOSCOPY WITH PROPOFOL N/A 06/30/2016   Procedure: COLONOSCOPY WITH PROPOFOL;  Surgeon: Lucilla Lame, MD;  Location: [facility];  Service: Endoscopy;  Laterality: N/A;  . ELECTROPHYSIOLOGIC STUDY N/A 01/04/2016   Procedure: Cardioversion;  Surgeon: Wellington Hampshire, MD;  Location: [facility];  Service: Cardiovascular;  Laterality: N/A;  . ELECTROPHYSIOLOGIC STUDY N/A 02/17/2016   Procedure: A-Flutter Ablation;  Surgeon: Deboraha Sprang, MD;  Location: [facility];  Service: Cardiovascular;  Laterality: N/A;  . GANGLION CYST EXCISION Right 1994   wrist & back  . HERNIA REPAIR Right 1994   abdominal repair with mesh  . NASAL SEPTUM SURGERY  2011   Dr. [name]     Home Medications:  Allergies as of 11/22/2017      Reactions   Other Other (See Comments)   Pollen:  Sinus infection, cough, fatigue      Medication List        Accurate as of 11/22/17  8:51 AM. Always use your most recent med list.          ALPRAZolam 0.5 MG tablet Commonly known as:  XANAX TAKE 1 TABLET BY MOUTH EVERY NIGHT AT BEDTIME AS NEEDED FOR ANXIETY   atorvastatin 10 MG tablet Commonly known as:  LIPITOR TAKE 1 TABLET(10 MG) BY MOUTH EVERY MORNING   cyclobenzaprine 10 MG tablet Commonly known as:  FLEXERIL TAKE 1 TABLET(10 MG) BY MOUTH THREE TIMES DAILY AS NEEDED FOR MUSCLE SPASMS   escitalopram 10 MG tablet Commonly known as:  LEXAPRO TAKE 1 TABLET(10 MG) BY MOUTH DAILY   finasteride 5 MG  tablet Commonly known as:  PROSCAR Take 1 tablet (5 mg total) by mouth  daily.   ibuprofen 200 MG tablet Commonly known as:  ADVIL,MOTRIN Take 200-400 mg by mouth daily as needed for headache or moderate pain.   loratadine 10 MG tablet Commonly known as:  CLARITIN Take 10 mg by mouth daily as needed for allergies.   losartan 50 MG tablet Commonly known as:  COZAAR Take 1 tablet (50 mg total) by mouth daily.   meloxicam 15 MG tablet Commonly known as:  MOBIC TAKE 1 TABLET(15 MG) BY MOUTH DAILY AS NEEDED FOR PAIN   pantoprazole 40 MG tablet Commonly known as:  PROTONIX TAKE 1 TABLET(40 MG) BY MOUTH DAILY   tadalafil 5 MG tablet Commonly known as:  CIALIS Take 1 tablet (5 mg total) by mouth daily.   triamcinolone 55 MCG/ACT Aero nasal inhaler Commonly known as:  NASACORT Place 2 sprays into the nose daily as needed (allergies).   TRULANCE 3 MG Tabs Generic drug:  Plecanatide TAKE 1 TABLET BY MOUTH EVERY DAY       Allergies:  Allergies  Allergen Reactions  . Other Other (See Comments)    Pollen:  Sinus infection, cough, fatigue     Family History: Family History  Problem Relation Age of Onset  . Hyperlipidemia Mother   . Cancer Mother        skin cancer?  . Heart disease Mother 5       MI - died in her sleep  . Hyperlipidemia Father   . Heart disease Father   . Kidney disease Neg Hx   . Prostate cancer Neg Hx   . Kidney cancer Neg Hx   . Bladder Cancer Neg Hx     Social History:  reports that he quit smoking about 26 years ago. His smoking use included cigarettes. He quit after 20.00 years of use. He has never used smokeless tobacco. He reports that he does not drink alcohol or use drugs.  ROS: UROLOGY Frequent Urination?: Yes Hard to postpone urination?: No Burning/pain with urination?: No Get up at night to urinate?: Yes Leakage of urine?: No Urine stream starts and stops?: Yes Trouble starting stream?: No Do you have to strain to urinate?:  No Blood in urine?: No Urinary tract infection?: No Sexually transmitted disease?: No Injury to kidneys or bladder?: No Painful intercourse?: No Weak stream?: Yes Erection problems?: No Penile pain?: No  Gastrointestinal Nausea?: No Vomiting?: No Indigestion/heartburn?: Yes Diarrhea?: No Constipation?: Yes  Constitutional Fever: No Night sweats?: No Weight loss?: No Fatigue?: Yes  Skin Skin rash/lesions?: No Itching?: No  Eyes Blurred vision?: No Double vision?: No  Ears/Nose/Throat Sore throat?: No Sinus problems?: No  Hematologic/Lymphatic Swollen glands?: No Easy bruising?: No  Cardiovascular Leg swelling?: No Chest pain?: No  Respiratory Cough?: Yes Shortness of breath?: No  Endocrine Excessive thirst?: No  Musculoskeletal Back pain?: Yes Joint pain?: No  Neurological Headaches?: Yes Dizziness?: No  Psychologic Depression?: Yes Anxiety?: Yes  Physical Exam: BP (!) 151/76 (BP Location: Left Arm, Patient Position: Sitting, Cuff Size: Normal)   Pulse 67   Ht 5\' 10"  (1.778 m)   Wt 200 lb 3.2 oz (90.8 kg)   BMI 28.73 kg/m   Constitutional: Well nourished. Alert and oriented, No acute distress. HEENT: [place] AT, moist mucus membranes. Trachea midline, no masses. Cardiovascular: No clubbing, cyanosis, or edema. Respiratory: Normal respiratory effort, no increased work of breathing. GI: Abdomen is soft, non tender, non distended, no abdominal masses. Liver and spleen not palpable.  No hernias appreciated.  Stool sample for occult testing  is not indicated.   GU: No CVA tenderness.  No bladder fullness or masses.  Patient with circumcised phallus.  Urethral meatus is patent.  No penile discharge. No penile lesions or rashes.  Scrotum without lesions, cysts, rashes and/or edema.  Testicles are located scrotally bilaterally. No masses are appreciated in the testicles. Left and right epididymis are normal. Rectal: Patient with  normal sphincter tone.  Anus and perineum without scarring or rashes. No rectal masses are appreciated. Prostate is approximately 50 grams, no nodules are appreciated. Seminal vesicles are normal. Skin: No rashes, bruises or suspicious lesions. Lymph: No cervical or inguinal adenopathy. Neurologic: Grossly intact, no focal deficits, moving all 4 extremities. Psychiatric: Normal mood and affect.  Laboratory Data: Lab Results  Component Value Date   WBC 6.6 02/22/2017   HGB 15.2 02/22/2017   HCT 44.5 02/22/2017   MCV 87.3 02/22/2017   PLT 207 02/22/2017    Lab Results  Component Value Date   CREATININE 0.86 11/01/2017    Lab Results  Component Value Date   PSA 3.49 11/19/2013  PSA                             3.6 ng/mL                                                       11/24/2014 PSA   2.8 ng/mL     12/28/2015 PSA   1.9 (3.8) ng/mL      11/16/2016 PSA   1.6 (3.8) ng/mL     11/20/2017  Lab Results  Component Value Date   TSH 0.585 11/29/2015       Component Value Date/Time   CHOL 168 01/23/2017 0903   HDL 53.50 01/23/2017 0903   CHOLHDL 3 01/23/2017 0903   VLDL 22.0 01/23/2017 0903   LDLCALC 92 01/23/2017 0903   I have reviewed the labs.   Assessment & Plan:    1. BPH (benign prostatic hyperplasia) with LUTS:     IPSS score is 20/3, it is improved Continue the finasteride 5 mg and Cialis 5 mg; refills given RTC in 12 months for IPSS, PSA and PVR    2. OAB Managed with Cialis  He will return in 12 months for I PSS score and PVR  3. Sleep apnea Working on getting dental device  4. ED SHIM score is 15 Continue Cialis 5 mg daily Asked patient to report any worsening of his curvature or penile pain with erections RTC in one year for SHIM   Return in about 1 year (around 11/23/2018) for IPSS, SHIM, PSA and exam.  These notes generated with voice recognition software. I apologize for typographical errors.  Zara Council, [name]  [facility] [address] [place], [place] [address] [phone]

## 2017-12-04 ENCOUNTER — Telehealth: Payer: Self-pay | Admitting: Family Medicine

## 2017-12-04 NOTE — Telephone Encounter (Signed)
Noted.  Please contact the patient see if he has difficulty walking more than 200 feet without stopping to rest or if he has to walk with a brace, cane, or crutch.  Please also see if he is severely limited in his ability to walk.  I need to know these before I can consider filling this out.

## 2017-12-04 NOTE — Telephone Encounter (Signed)
Placed on Provider's desk to fill out and sign.   Thanks.

## 2017-12-04 NOTE — Telephone Encounter (Signed)
PT dropped off handicapp renewal form to be signed by Dr. Caryl Bis. Form is up front in color folder.

## 2017-12-05 DIAGNOSIS — R200 Anesthesia of skin: Secondary | ICD-10-CM | POA: Diagnosis not present

## 2017-12-05 NOTE — Telephone Encounter (Signed)
Called and spoke with pt. Pt stated that YES he does have difficulty walking 200 feet with out stopping to rest or take breaks. Pt does use a cane sometimes but not all the time. Pt declined having being severely limited in his ability to walk.   Pt stated that he has a bulging disc lower back and arthritis in both knees. Stated that Dr. Gilford Rile filled this out for her him last year.   Sent to PCP

## 2017-12-06 DIAGNOSIS — M79602 Pain in left arm: Secondary | ICD-10-CM | POA: Diagnosis not present

## 2017-12-06 DIAGNOSIS — G8929 Other chronic pain: Secondary | ICD-10-CM | POA: Diagnosis not present

## 2017-12-06 DIAGNOSIS — M79601 Pain in right arm: Secondary | ICD-10-CM | POA: Diagnosis not present

## 2017-12-06 NOTE — Telephone Encounter (Signed)
Completed.  Please make available for him to pick up.

## 2017-12-07 NOTE — Telephone Encounter (Signed)
Called and spoke with pt. Form completed and placed up front for pick up. Pt advised and voiced understanding.

## 2017-12-12 DIAGNOSIS — K5904 Chronic idiopathic constipation: Secondary | ICD-10-CM | POA: Diagnosis not present

## 2017-12-12 DIAGNOSIS — R142 Eructation: Secondary | ICD-10-CM | POA: Diagnosis not present

## 2017-12-13 DIAGNOSIS — R200 Anesthesia of skin: Secondary | ICD-10-CM | POA: Insufficient documentation

## 2017-12-13 DIAGNOSIS — M79605 Pain in left leg: Secondary | ICD-10-CM | POA: Insufficient documentation

## 2017-12-13 DIAGNOSIS — G8929 Other chronic pain: Secondary | ICD-10-CM | POA: Insufficient documentation

## 2017-12-13 DIAGNOSIS — M79602 Pain in left arm: Secondary | ICD-10-CM | POA: Insufficient documentation

## 2017-12-13 DIAGNOSIS — M79604 Pain in right leg: Secondary | ICD-10-CM | POA: Insufficient documentation

## 2017-12-27 ENCOUNTER — Other Ambulatory Visit: Payer: Self-pay | Admitting: Family Medicine

## 2017-12-28 NOTE — Telephone Encounter (Signed)
Controlled substance database reviewed. Sent to pharmacy.   

## 2018-01-04 ENCOUNTER — Other Ambulatory Visit: Payer: Self-pay | Admitting: Family Medicine

## 2018-01-11 IMAGING — US US SOFT TISSUE HEAD/NECK
1 series · 9 of 9 positions shown · non-contrast
Comparison: None.

CLINICAL DATA: 61-year-old male with a history of left neck
swelling for several months

EXAM:
ULTRASOUND OF HEAD/NECK SOFT TISSUES
TECHNIQUE: Ultrasound examination of the head and neck soft tissues was
performed in the area of clinical concern.

[Series 1: us soft tissue head/neck · 0.07mm/px · 9 of 9 slices shown]
[im 1/9]
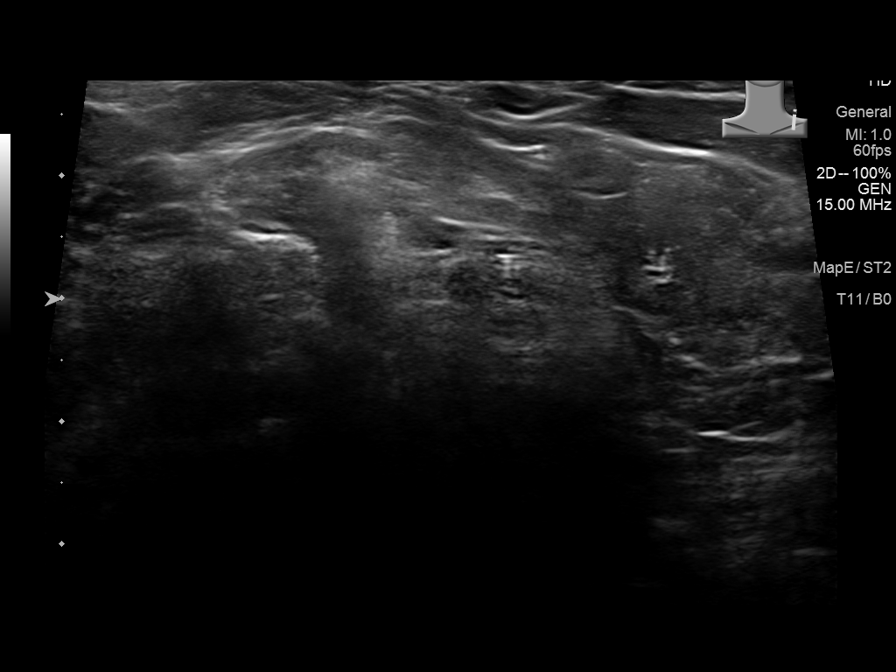
[im 2/9]
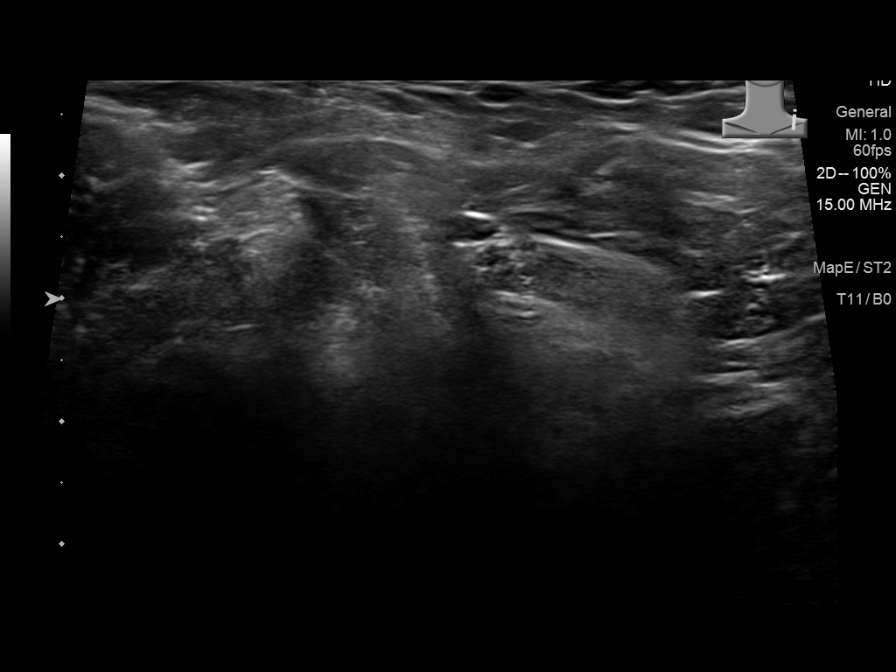
[im 3/9]
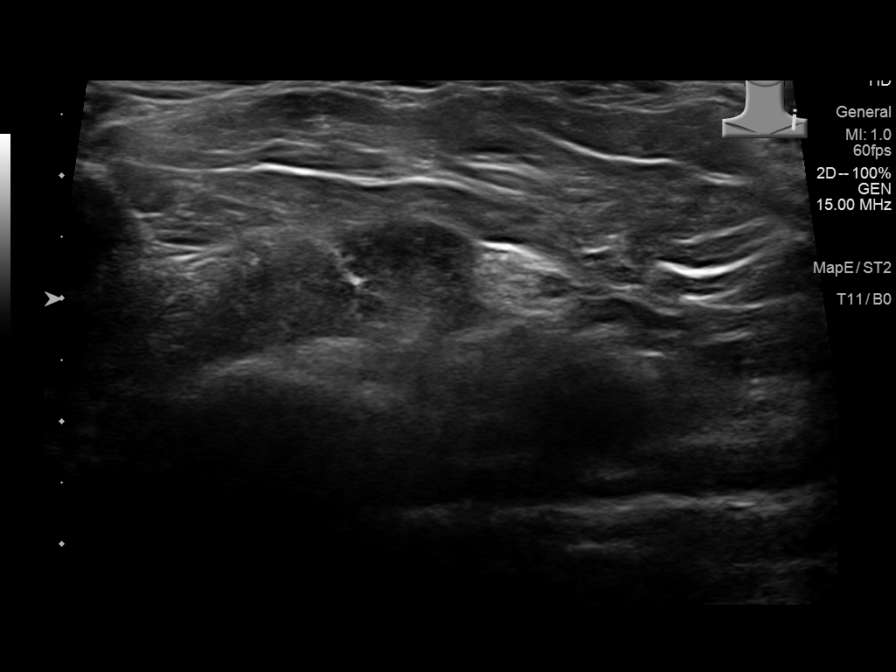
[im 4/9]
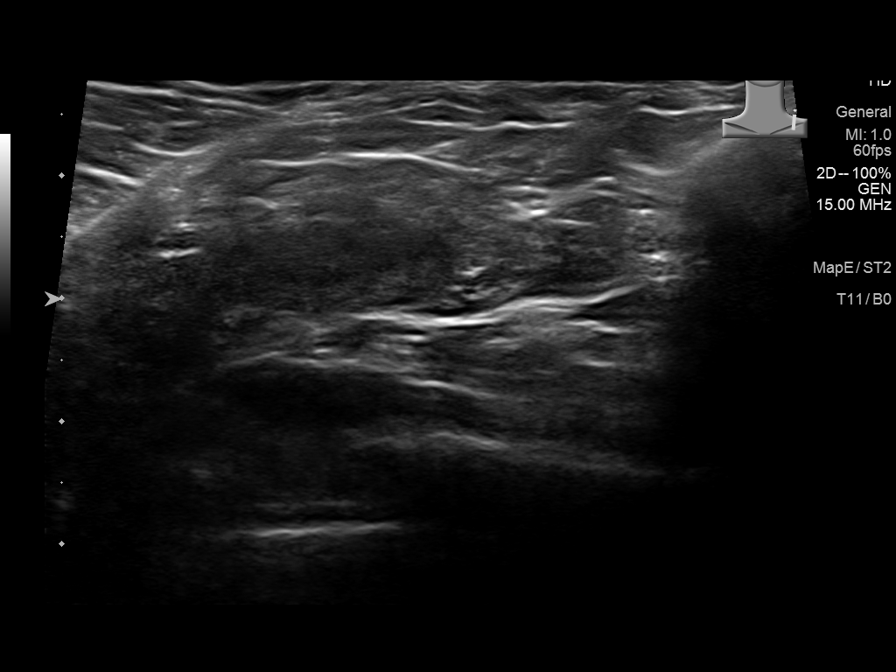
[im 5/9]
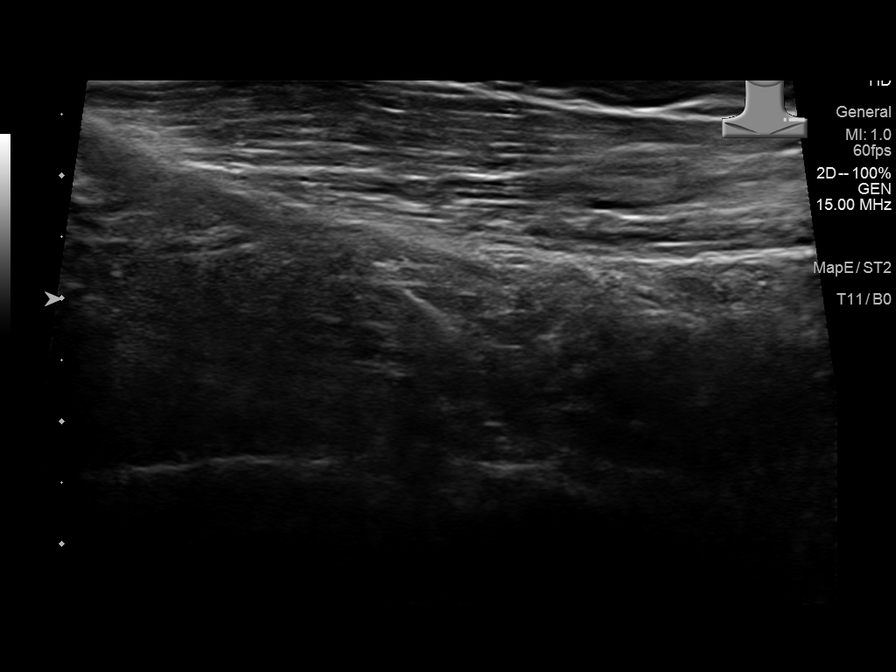
[im 6/9]
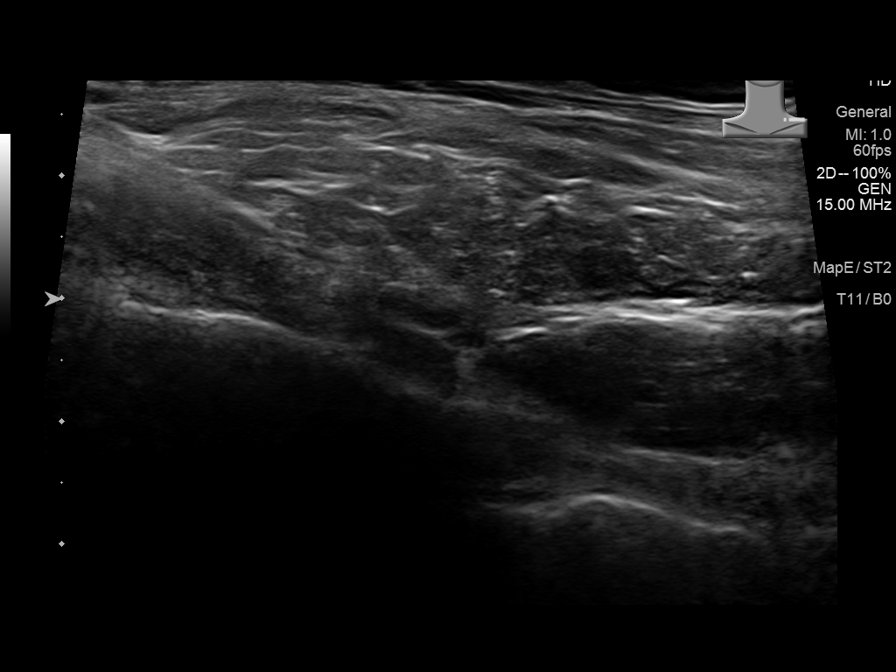
[im 7/9]
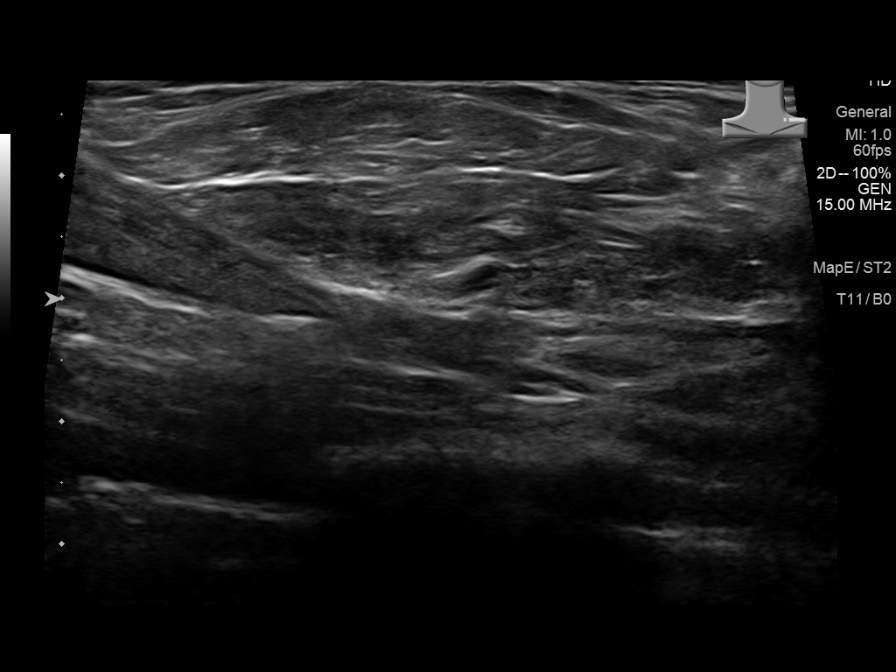
[im 8/9]
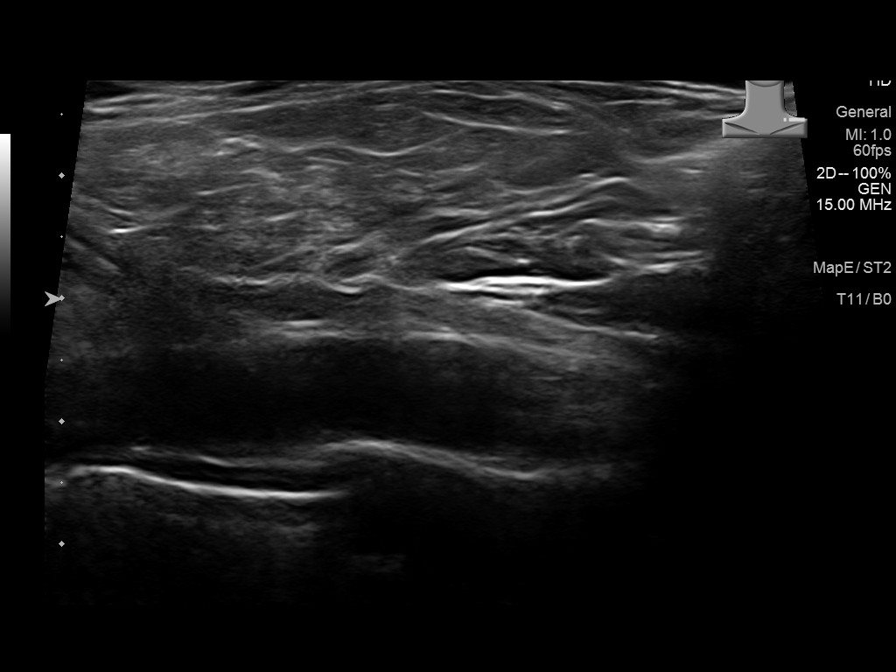
[im 9/9]
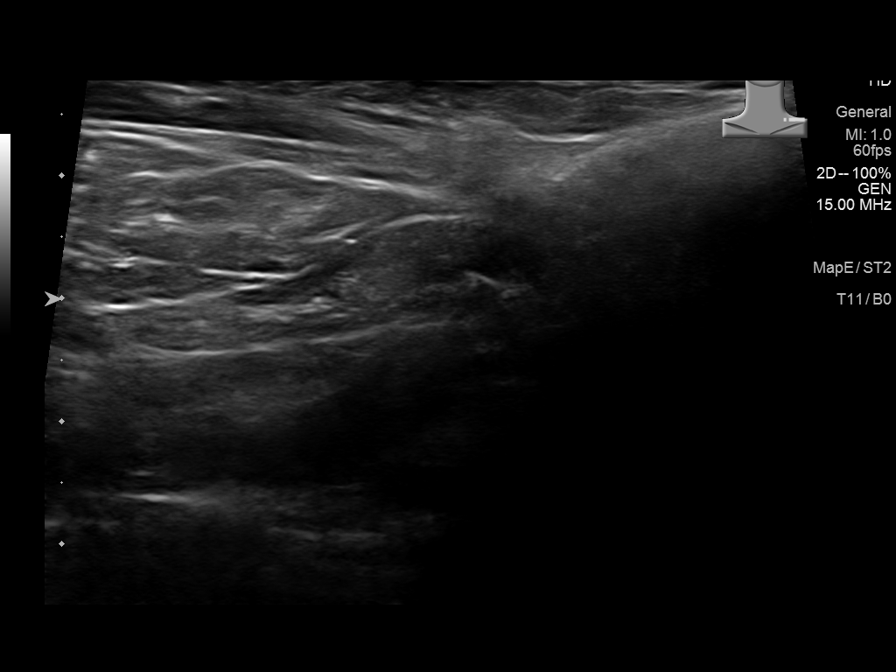

[9 of 9 positions shown; findings below may reference images not displayed]

FINDINGS: Grayscale duplex ultrasound performed in the region of clinical
concern.

No evidence of soft tissue lesion.  No focal fluid collection.
IMPRESSION: Unremarkable sonographic survey in the region of clinical concern.

## 2018-01-16 ENCOUNTER — Ambulatory Visit (INDEPENDENT_AMBULATORY_CARE_PROVIDER_SITE_OTHER): Payer: Medicare Other | Admitting: Family Medicine

## 2018-01-16 ENCOUNTER — Encounter: Payer: Self-pay | Admitting: Family Medicine

## 2018-01-16 VITALS — BP 120/80 | HR 83 | Temp 98.0°F | Ht 70.0 in | Wt 203.8 lb

## 2018-01-16 DIAGNOSIS — G8929 Other chronic pain: Secondary | ICD-10-CM

## 2018-01-16 DIAGNOSIS — F4323 Adjustment disorder with mixed anxiety and depressed mood: Secondary | ICD-10-CM | POA: Diagnosis not present

## 2018-01-16 DIAGNOSIS — R05 Cough: Secondary | ICD-10-CM

## 2018-01-16 DIAGNOSIS — R053 Chronic cough: Secondary | ICD-10-CM

## 2018-01-16 DIAGNOSIS — E785 Hyperlipidemia, unspecified: Secondary | ICD-10-CM

## 2018-01-16 DIAGNOSIS — K219 Gastro-esophageal reflux disease without esophagitis: Secondary | ICD-10-CM | POA: Diagnosis not present

## 2018-01-16 DIAGNOSIS — M5441 Lumbago with sciatica, right side: Secondary | ICD-10-CM | POA: Diagnosis not present

## 2018-01-16 DIAGNOSIS — L729 Follicular cyst of the skin and subcutaneous tissue, unspecified: Secondary | ICD-10-CM | POA: Diagnosis not present

## 2018-01-16 DIAGNOSIS — M542 Cervicalgia: Secondary | ICD-10-CM | POA: Diagnosis not present

## 2018-01-16 DIAGNOSIS — M5442 Lumbago with sciatica, left side: Secondary | ICD-10-CM | POA: Diagnosis not present

## 2018-01-16 DIAGNOSIS — G4730 Sleep apnea, unspecified: Secondary | ICD-10-CM | POA: Diagnosis not present

## 2018-01-16 DIAGNOSIS — R7303 Prediabetes: Secondary | ICD-10-CM | POA: Diagnosis not present

## 2018-01-16 LAB — COMPREHENSIVE METABOLIC PANEL
ALT: 14 U/L (ref 0–53)
AST: 17 U/L (ref 0–37)
Albumin: 4.1 g/dL (ref 3.5–5.2)
Alkaline Phosphatase: 66 U/L (ref 39–117)
BUN: 15 mg/dL (ref 6–23)
CO2: 31 mEq/L (ref 19–32)
Calcium: 9.8 mg/dL (ref 8.4–10.5)
Chloride: 104 mEq/L (ref 96–112)
Creatinine, Ser: 0.89 mg/dL (ref 0.40–1.50)
GFR: 92.14 mL/min (ref >60.00)
Glucose, Bld: 86 mg/dL (ref 70–99)
Potassium: 4.6 mEq/L (ref 3.5–5.1)
Sodium: 140 mEq/L (ref 135–145)
Total Bilirubin: 1.2 mg/dL (ref 0.2–1.2)
Total Protein: 6.7 g/dL (ref 6.0–8.3)

## 2018-01-16 LAB — LIPID PANEL
Cholesterol: 167 mg/dL (ref 0–200)
HDL: 50.4 mg/dL (ref >39.00)
LDL Cholesterol: 79 mg/dL (ref 0–99)
NonHDL: 116.83
Total CHOL/HDL Ratio: 3
Triglycerides: 189 mg/dL — ABNORMAL HIGH (ref 0.0–149.0)
VLDL: 37.8 mg/dL (ref 0.0–40.0)

## 2018-01-16 LAB — HEMOGLOBIN A1C: Hgb A1c MFr Bld: 5.6 % (ref 4.6–6.5)

## 2018-01-16 MED ORDER — ESCITALOPRAM OXALATE 20 MG PO TABS: 20.0000 mg | ORAL_TABLET | Freq: Every day | ORAL | 1 refills | Status: DC

## 2018-01-16 NOTE — Assessment & Plan Note (Addendum)
Stable.  He will continue to see neurosurgery.

## 2018-01-16 NOTE — Assessment & Plan Note (Signed)
Small skin cyst noted just adjacent to the spine right neck.  Unchanged from prior.  Offered dermatology referral though he deferred.

## 2018-01-16 NOTE — Patient Instructions (Signed)
Nice to see you. We will increase your Lexapro dose.  If you develop any adverse effects please let us know and decrease the dose back to 10 mg daily. Please try to get set up with your oral device for sleep apnea.

## 2018-01-16 NOTE — Assessment & Plan Note (Signed)
Improved with treatment of allergy symptoms and reflux.  He will monitor.

## 2018-01-16 NOTE — Progress Notes (Signed)
Tyler Rumps, MD Phone: [phone]  [name]. is a 62 y.o. male who presents today for follow-up.  CC: Depression, sleep apnea, neck and back pain, GERD  Depression: Patient notes this is unchanged.  He continues on Lexapro.  He takes Xanax occasionally as it does help him relax.  No alcohol use with this.  No SI.  His anxiety persists as well.  Sleep apnea: He has been working on getting an oral device for his sleep apnea.  That issues getting it out of his mouth when he was being fit for it.  He had some teeth issues with it as well.  He follows up with them tomorrow for further work on this.  Neck and back pain: This is a chronic issue.  He plans on getting steroid injections in his low back after the holidays.  He was unable to get a good quality cervical spine MRI.  He notes his symptoms are unchanged.  He notes numbness in his third through fifth fingers in his left hand and occasional electrical shock sensation from his shoulder down his left arm.  Trickle symptoms  GERD: Taking Protonix.  He does note his prior cough is better.  He has been treated by Protonix and allergy medications for this.  No reflux symptoms.  No abdominal pain, blood in stool, or dysphagia.  Social History   Tobacco Use  Smoking Status Former Smoker  . Years: 20.00  . Types: Cigarettes  . Last attempt to quit: 02/08/1991  . Years since quitting: 26.9  Smokeless Tobacco Never Used     ROS see history of present illness  Objective  Physical Exam Vitals:   01/16/18 1049  BP: 120/80  Pulse: 83  Temp: 98 F (36.7 C)  SpO2: 95%    BP Readings from Last 3 Encounters:  01/16/18 120/80  11/22/17 (!) 151/76  11/01/17 116/80   Wt Readings from Last 3 Encounters:  01/16/18 203 lb 12.8 oz (92.4 kg)  11/22/17 200 lb 3.2 oz (90.8 kg)  11/01/17 199 lb 12.8 oz (90.6 kg)    Physical Exam  Constitutional: No distress.  Cardiovascular: Normal rate, regular rhythm and normal heart sounds.    Pulmonary/Chest: Effort normal and breath sounds normal.  Abdominal: Soft. Bowel sounds are normal. He exhibits no distension. There is no tenderness. There is no rebound and no guarding.  Musculoskeletal: He exhibits no edema.  Neurological: He is alert.  5/5 strength in bilateral biceps, triceps, grip, quads, hamstrings, plantar and dorsiflexion, sensation to light touch slightly decreased left third through fifth fingers otherwise intact in bilateral UE and LE, normal gait  Skin: Skin is warm and dry. He is not diaphoretic.  Small skin cyst that is nontender.  Located adjacent to his cervical spine on the right   Assessment/Plan: Please see individual problem list.  GERD (gastroesophageal reflux disease) Adequately controlled.  Continue Protonix.  Skin cyst Small skin cyst noted just adjacent to the spine right neck.  Unchanged from prior.  Offered dermatology referral though he deferred.  Adjustment disorder with mixed anxiety and depressed mood Stable.  Will increase Lexapro.  He will let us know if he has any issues with this.  Chronic cough Improved with treatment of allergy symptoms and reflux.  He will monitor.  Sleep apnea Patient will continue to try to get his oral device.  Chronic low back pain Stable.  He will continue to see neurosurgery.  Chronic neck pain Stable.  He will continue to  see neurosurgery.  Check A1c and lipid panel for prediabetes and hyperlipidemia.  Orders Placed This Encounter  Procedures  . Lipid panel  . Comp Met (CMET)  . HgB A1c    Meds ordered this encounter  Medications  . escitalopram (LEXAPRO) 20 MG tablet    Sig: Take 1 tablet (20 mg total) by mouth daily.    Dispense:  90 tablet    Refill:  1     Tyler Rumps, MD [place] [facility]

## 2018-01-16 NOTE — Assessment & Plan Note (Signed)
Patient will continue to try to get his oral device.

## 2018-01-16 NOTE — Assessment & Plan Note (Signed)
Stable.  Will increase Lexapro.  He will let us know if he has any issues with this.

## 2018-01-16 NOTE — Assessment & Plan Note (Signed)
Adequately controlled.  Continue Protonix. 

## 2018-01-30 ENCOUNTER — Other Ambulatory Visit: Payer: Self-pay | Admitting: Family Medicine

## 2018-02-03 ENCOUNTER — Other Ambulatory Visit: Payer: Self-pay | Admitting: Family Medicine

## 2018-02-19 DIAGNOSIS — M4726 Other spondylosis with radiculopathy, lumbar region: Secondary | ICD-10-CM | POA: Diagnosis not present

## 2018-02-19 DIAGNOSIS — M5136 Other intervertebral disc degeneration, lumbar region: Secondary | ICD-10-CM | POA: Diagnosis not present

## 2018-02-19 DIAGNOSIS — M5416 Radiculopathy, lumbar region: Secondary | ICD-10-CM | POA: Diagnosis not present

## 2018-02-19 DIAGNOSIS — M5412 Radiculopathy, cervical region: Secondary | ICD-10-CM | POA: Diagnosis not present

## 2018-02-19 DIAGNOSIS — M5030 Other cervical disc degeneration, unspecified cervical region: Secondary | ICD-10-CM | POA: Diagnosis not present

## 2018-03-05 ENCOUNTER — Other Ambulatory Visit: Payer: Self-pay | Admitting: Family Medicine

## 2018-03-06 NOTE — Telephone Encounter (Signed)
Controlled substance database reviewed. Sent to pharmacy.   

## 2018-03-06 NOTE — Telephone Encounter (Signed)
Last OV 01/16/2018   Xanax last refilled 12/28/2017 disp 20 with no refills   Cyclobenzaprine last refilled 11/07/2017 disp 90 with no refills   Sent to PCP to advise

## 2018-03-14 DIAGNOSIS — M47816 Spondylosis without myelopathy or radiculopathy, lumbar region: Secondary | ICD-10-CM | POA: Diagnosis not present

## 2018-04-04 ENCOUNTER — Other Ambulatory Visit: Payer: Self-pay | Admitting: Family Medicine

## 2018-04-11 DIAGNOSIS — M47816 Spondylosis without myelopathy or radiculopathy, lumbar region: Secondary | ICD-10-CM | POA: Diagnosis not present

## 2018-04-11 DIAGNOSIS — M5416 Radiculopathy, lumbar region: Secondary | ICD-10-CM | POA: Diagnosis not present

## 2018-04-11 DIAGNOSIS — M5136 Other intervertebral disc degeneration, lumbar region: Secondary | ICD-10-CM | POA: Diagnosis not present

## 2018-04-11 DIAGNOSIS — M5412 Radiculopathy, cervical region: Secondary | ICD-10-CM | POA: Diagnosis not present

## 2018-04-11 DIAGNOSIS — M5030 Other cervical disc degeneration, unspecified cervical region: Secondary | ICD-10-CM | POA: Diagnosis not present

## 2018-04-20 ENCOUNTER — Other Ambulatory Visit: Payer: Self-pay | Admitting: Family Medicine

## 2018-04-20 NOTE — Telephone Encounter (Signed)
Last OV 01/16/2018   Last refilled 03/06/2018 disp 20 with no refills   Pt is aware PCP out of the office pt is OK with waiting for refill until PCP returns.

## 2018-04-24 NOTE — Telephone Encounter (Signed)
Sent to PCP ?

## 2018-04-29 ENCOUNTER — Other Ambulatory Visit: Payer: Self-pay | Admitting: Family Medicine

## 2018-05-01 ENCOUNTER — Encounter: Payer: Self-pay | Admitting: Family Medicine

## 2018-05-01 ENCOUNTER — Other Ambulatory Visit: Payer: Self-pay

## 2018-05-01 ENCOUNTER — Ambulatory Visit (INDEPENDENT_AMBULATORY_CARE_PROVIDER_SITE_OTHER): Payer: Medicare Other | Admitting: Family Medicine

## 2018-05-01 DIAGNOSIS — F4323 Adjustment disorder with mixed anxiety and depressed mood: Secondary | ICD-10-CM

## 2018-05-01 DIAGNOSIS — I10 Essential (primary) hypertension: Secondary | ICD-10-CM

## 2018-05-01 DIAGNOSIS — K5900 Constipation, unspecified: Secondary | ICD-10-CM

## 2018-05-01 NOTE — Patient Instructions (Addendum)
Nice to see you. Please try adding MiraLAX to your regimen with Metamucil and Senokot. Please start checking your blood pressure 2-3 times a week and keep a log of this.  If it starts to run greater than 140/90 please let us know.

## 2018-05-01 NOTE — Assessment & Plan Note (Signed)
Patient denies depression.  Notes anxiety is well controlled.  Continues with rare use of Xanax.  Discussed monitoring for drowsiness and limiting use.  Continue Lexapro.

## 2018-05-01 NOTE — Assessment & Plan Note (Signed)
This is a chronic issue.  His stools have been unchanged for a long time per the patient's report.  We discussed adding MiraLAX to his current regimen of Senokot and Metamucil.  He will take the MiraLAX in the morning.  He will monitor his symptoms.

## 2018-05-01 NOTE — Progress Notes (Signed)
Tyler Rumps, MD Phone: [phone]  [name]. is a 63 y.o. male who presents today for follow-up.  Constipation: Patient notes he has had issues with constipation for many years.  Previously he was on trulance.  He has not been taking this recently.  When he would take it it would clean him out.  He has been using Senokot and Metamucil at night.  If he does not do that he feels like he gets backed up.  He does have a bowel movement daily though does not feel like he empties.  He notes his stools are thin though the caliber has not changed in quite some time.  He does strain.  Some bloating.  No abdominal pain.  No blood in his stool.  He does belch a lot.  He has seen GI and was most recently advised to use Senokot and Metamucil at night and then add MiraLAX if needed.  Anxiety: Notes this is under pretty good control.  He rarely takes his Xanax at night.  Only takes it if he feels like he needs to sleep.  Takes Lexapro 10 mg daily.  No depression.  No drowsiness or alcohol use with the Xanax.  Hypertension: Does not check it consistently.  Notes diastolically is mostly in the 80s.  He does take losartan.  No chest pain, shortness of breath, or edema.  He wonders about getting a pneumonia shot and whether or not this is indicated.  He also questions whether or not he needs STD screening.  He notes his wife had the screening tests done last year and wondered if he needed screening done.  He notes he is sexually active with only his wife.  He notes he does not feel as though he needs this and is having no symptoms.  Social History   Tobacco Use  Smoking Status Former Smoker  . Years: 20.00  . Types: Cigarettes  . Last attempt to quit: 02/08/1991  . Years since quitting: 27.2  Smokeless Tobacco Never Used     ROS see history of present illness  Objective  Physical Exam Vitals:   05/01/18 1021  BP: 120/86  Pulse: 89  Temp: 98.1 F (36.7 C)  SpO2: 96%    BP Readings  from Last 3 Encounters:  05/01/18 120/86  01/16/18 120/80  11/22/17 (!) 151/76   Wt Readings from Last 3 Encounters:  05/01/18 206 lb 3.2 oz (93.5 kg)  01/16/18 203 lb 12.8 oz (92.4 kg)  11/22/17 200 lb 3.2 oz (90.8 kg)    Physical Exam Constitutional:      General: He is not in acute distress.    Appearance: He is not diaphoretic.  Cardiovascular:     Rate and Rhythm: Normal rate and regular rhythm.     Heart sounds: Normal heart sounds.  Pulmonary:     Effort: Pulmonary effort is normal.     Breath sounds: Normal breath sounds.  Abdominal:     General: Bowel sounds are normal. There is no distension.     Palpations: Abdomen is soft.     Tenderness: There is no abdominal tenderness. There is no guarding or rebound.  Musculoskeletal:     Right lower leg: No edema.     Left lower leg: No edema.  Skin:    General: Skin is warm and dry.  Neurological:     Mental Status: He is alert.      Assessment/Plan: Please see individual problem list.  Hypertension Well-controlled in the  office.  Discussed having him check it 2-3 times a week at home.  He will continue his current regimen.  Constipation This is a chronic issue.  His stools have been unchanged for a long time per the patient's report.  We discussed adding MiraLAX to his current regimen of Senokot and Metamucil.  He will take the MiraLAX in the morning.  He will monitor his symptoms.  Adjustment disorder with mixed anxiety and depressed mood Patient denies depression.  Notes anxiety is well controlled.  Continues with rare use of Xanax.  Discussed monitoring for drowsiness and limiting use.  Continue Lexapro.   Health Maintenance: Discussed that a pneumonia vaccine was not indicated and the patient at this time.  I offered STD screening for the patient given his questions though he declined this.  No orders of the defined types were placed in this encounter.   No orders of the defined types were placed in this  encounter.    Tyler Rumps, MD [place]

## 2018-05-01 NOTE — Assessment & Plan Note (Signed)
Well-controlled in the office.  Discussed having him check it 2-3 times a week at home.  He will continue his current regimen.

## 2018-05-04 ENCOUNTER — Other Ambulatory Visit: Payer: Self-pay | Admitting: Family Medicine

## 2018-05-04 NOTE — Telephone Encounter (Signed)
Please contact the patient and see if he is taking meloxicam daily.  Please also find out if he is taking ibuprofen at home.  He should not be taking both of these medications if he is.  Thanks.

## 2018-05-04 NOTE — Telephone Encounter (Signed)
Last OV   Last refilled   mobic 02/06/2018 disp 90 with no refill  lexapro 02/01/2018 disp 90 with 1 refill   Next appt none scheduled   Sent to PCP to advise

## 2018-05-04 NOTE — Telephone Encounter (Signed)
Called and spoke with pt. Pt stated that YES he is still taking the mobic and NO he does not take ibuprofen and is aware that he should NOT take ibuprofen and mobic together.   Sent to PCP to refill Rx's

## 2018-05-29 DIAGNOSIS — J069 Acute upper respiratory infection, unspecified: Secondary | ICD-10-CM | POA: Diagnosis not present

## 2018-05-29 DIAGNOSIS — R509 Fever, unspecified: Secondary | ICD-10-CM | POA: Diagnosis not present

## 2018-05-29 DIAGNOSIS — Z8616 Personal history of COVID-19: Secondary | ICD-10-CM | POA: Insufficient documentation

## 2018-05-29 DIAGNOSIS — J988 Other specified respiratory disorders: Principal | ICD-10-CM

## 2018-05-29 DIAGNOSIS — Z03818 Encounter for observation for suspected exposure to other biological agents ruled out: Secondary | ICD-10-CM | POA: Diagnosis not present

## 2018-05-30 ENCOUNTER — Telehealth: Payer: Self-pay | Admitting: Family Medicine

## 2018-05-30 NOTE — Telephone Encounter (Signed)
Copied from [place] [phone]. Topic: Quick Communication - See Telephone Encounter >> May 30, 2018 11:04 AM Antonieta Iba C wrote: CRM for notification. See Telephone encounter for: 05/30/18.  Pt called in to make provider aware that he tested positive for covid. Pt says that he went to the hospital for his migraine and was Dx then. Pt says that he feels okay and doesn't have any concerns. Pt will call PCP if needed.

## 2018-05-30 NOTE — Telephone Encounter (Signed)
Called and spoke w/ patient.  Patient said that he spoke with the Health Department today by phone and was advised to quarantine himself for 7 days and 14 days for his wife and daughter.  Pt said that he and his wife are staying in a separate part of the house from his daughter.  Pt declined getting set up with the MyChart monitoring program since he says that the Health Department will be calling him for the next 7 days to monitor his symptoms and will advise him if he needs to quarantine for a longer period of time.

## 2018-05-30 NOTE — Telephone Encounter (Signed)
Noted  

## 2018-05-30 NOTE — Telephone Encounter (Signed)
Noted. Thank you for placing this in his FYI. There is a mychart monitoring program that we should set him up with. If he is willing to do this I can place the order for it. Please also confirm that he is quarantining himself at home and that his family is quarantining as well. Please find out what directions he was given for quarantining himself. Thanks.

## 2018-05-30 NOTE — Telephone Encounter (Signed)
Patient was tested at [facility] on 05/29/18.  Pt tested positive for COVID.  I've flagged pt's chart under FYI.

## 2018-06-03 ENCOUNTER — Other Ambulatory Visit: Payer: Self-pay | Admitting: Family Medicine

## 2018-06-04 NOTE — Telephone Encounter (Signed)
Last OV 05/01/2018  Last refilled 03/06/2018 disp 90 with no refills   Next OV 09/04/2018  Sent to covering provider [place]

## 2018-06-04 NOTE — Telephone Encounter (Signed)
Refilled flexeril on PCP behalf Advise pt that I refilled 30 tabs ( prior had been 65).

## 2018-06-06 ENCOUNTER — Encounter: Payer: Self-pay | Admitting: Internal Medicine

## 2018-06-06 ENCOUNTER — Ambulatory Visit: Payer: Self-pay | Admitting: Family Medicine

## 2018-06-06 ENCOUNTER — Ambulatory Visit (INDEPENDENT_AMBULATORY_CARE_PROVIDER_SITE_OTHER): Payer: Medicare Other | Admitting: Internal Medicine

## 2018-06-06 DIAGNOSIS — R05 Cough: Secondary | ICD-10-CM | POA: Diagnosis not present

## 2018-06-06 DIAGNOSIS — R140 Abdominal distension (gaseous): Secondary | ICD-10-CM

## 2018-06-06 DIAGNOSIS — J988 Other specified respiratory disorders: Secondary | ICD-10-CM

## 2018-06-06 DIAGNOSIS — K5900 Constipation, unspecified: Secondary | ICD-10-CM | POA: Diagnosis not present

## 2018-06-06 DIAGNOSIS — B370 Candidal stomatitis: Secondary | ICD-10-CM | POA: Diagnosis not present

## 2018-06-06 DIAGNOSIS — J9801 Acute bronchospasm: Secondary | ICD-10-CM

## 2018-06-06 DIAGNOSIS — R059 Cough, unspecified: Secondary | ICD-10-CM

## 2018-06-06 MED ORDER — NYSTATIN 100000 UNIT/ML MT SUSP: 5.0000 mL | Freq: Four times a day (QID) | OROMUCOSAL | 0 refills | Status: DC

## 2018-06-06 MED ORDER — VITAMIN C 500 MG PO TABS: 500.0000 mg | ORAL_TABLET | Freq: Every day | ORAL | 0 refills | Status: DC

## 2018-06-06 MED ORDER — BENZONATATE 100 MG PO CAPS: 200.0000 mg | ORAL_CAPSULE | Freq: Three times a day (TID) | ORAL | 0 refills | Status: DC | PRN

## 2018-06-06 MED ORDER — FLUCONAZOLE 150 MG PO TABS: 150.0000 mg | ORAL_TABLET | Freq: Once | ORAL | 0 refills | Status: AC

## 2018-06-06 MED ORDER — ALBUTEROL SULFATE HFA 108 (90 BASE) MCG/ACT IN AERS: 1.0000 | INHALATION_SPRAY | RESPIRATORY_TRACT | 0 refills | Status: DC | PRN

## 2018-06-06 NOTE — Telephone Encounter (Signed)
Pt. Reports he tested positive for COVID 19 and has some added symptoms of increased constipation and "no appetite". Also having urinary frequency - concerned about UTI. Has tried taking Senokot.Spoke with Santiago Glad in the practice and will forward triage for review.   Answer Assessment - Initial Assessment Questions 1. STOOL PATTERN OR FREQUENCY: "How often do you pass bowel movements (BMs)?"  (Normal range: tid to q 3 days)  "When was the last BM passed?"       5 days ago 2. STRAINING: "Do you have to strain to have a BM?"      Yes 3. RECTAL PAIN: "Does your rectum hurt when the stool comes out?" If so, ask: "Do you have hemorrhoids? How bad is the pain?"  (Scale 1-10; or mild, moderate, severe)     No 4. STOOL COMPOSITION: "Are the stools hard?"      No 5. BLOOD ON STOOLS: "Has there been any blood on the toilet tissue or on the surface of the BM?" If so, ask: "When was the last time?"      No 6. CHRONIC CONSTIPATION: "Is this a new problem for you?"  If no, ask: "How long have you had this problem?" (days, weeks, months)      No - but worse 7. CHANGES IN DIET: "Have there been any recent changes in your diet?"      Anorexia 8. MEDICATIONS: "Have you been taking any new medications?"     No 9. LAXATIVES: "Have you been using any laxatives or enemas?"  If yes, ask "What, how often, and when was the last time?"     Has tried Senokot 10. CAUSE: "What do you think is causing the constipation?"        Unsure 11. OTHER SYMPTOMS: "Do you have any other symptoms?" (e.g., abdominal pain, fever, vomiting)       Urination frequency 12. PREGNANCY: "Is there any chance you are pregnant?" "When was your last menstrual period?"       n/a  Protocols used: CONSTIPATION-A-AH

## 2018-06-06 NOTE — Telephone Encounter (Signed)
Pt went to the [facility] 1 week ago today on a Monday 4/21 and was tested for flu and the COVID-19 - pt tested psoitive  Pt is c/o seeing bubbles when voiding and having to frequently void, no dysuria, no back pain, and can't sleep at night, been dealing with cold chills.  Pt is concern that he has a bacterial infection in his stomach. Pt is c/o being conisipation no BM in the past 5 days, one day all he noticed was mucus coming from his rectum, no blood, no abdominal pain. Pt dose take Senokot, trying to keep fluids down, no desier to really eat anything and just over all feeling awful.   Fever wise pt stated that he hasn't had a fever in the past few days and the [place] [facility] clinic calls him almost everyday to check in with him on his COVID-19 symptoms.   Pt is aware that PCP is out of the office this week and will send this to covering provider to advise what they would like for the patient to do. Happy to schedule patient for virtual visit.

## 2018-06-06 NOTE — Telephone Encounter (Signed)
I called patient & explained Doxy. He is aware that he has 2:30 appt. & will be getting a text ahead of time to log on. He stated that he isn't very tech savvy, but wil try his best.

## 2018-06-06 NOTE — Progress Notes (Addendum)
Failed Doxy Telephone Note  I connected with [name]   on 06/06/18 at  2:32 PM EDT by a telephone and verified that I am speaking with the correct person using two identifiers.  Location patient: home Location provider:work or home office Persons participating in the virtual visit: patient, provider, pts wife  I discussed the limitations of evaluation and management by telemedicine and the availability of in person appointments. The patient expressed understanding and agreed to proceed.   HPI: Dx COVID 19 05/29/2018 at [facility] today reason for visit c/o reduce appetite, stomach upset doesn't feel like eating. He has not had a bowel movement in 4-5 days but really has not been eating. He is able to pass gas. When he did have a bowel movement it was like mucous. He has felt bloated  COVID 19 sxs dry cough no phelgm, fever today 100.3 max 101.5, denies diarrhea, has MSK pain with coughing, mild sob, rare chills, no nausea. He does have h/a, no sore throat or runny nose He tried Robitussin DM x 2 days but he felt like it gave him a worse temp so no longer taking . He reports he is disabled not work and wife worked at [employer] not sure how he contracted and has not recently traveled.   C/w white changes to tongue he looked it up c/w thrush    ROS: See pertinent positives and negatives per HPI.  Past Medical History:  Diagnosis Date  . Anxiety   . Arthritis   . BPH (benign prostatic hyperplasia) 11/19/2013  . 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 Bulging disc    C2/3, C3/4, C6/7  . Cervical disc herniation    C4/5 and C5/6  . Colon polyp 04/18/2011  . Constipation 08/31/2012  . Depression   . Diverticulosis   . 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 GERD (gastroesophageal reflux disease)   . H/O diverticulitis of colon   . Hemorrhoid   . Hiatal hernia   . Hyperlipidemia   . Levoscoliosis   . Sleep difficulties   . Spinal stenosis in cervical  region    cord abutment C4/5  . Testicular pain, left   . Typical atrial flutter ([place]) 11/2015   a. CHADS2VASc => 0; b. on Eliquis for pending ablation     Past Surgical History:  Procedure Laterality Date  . ATRIAL FLUTTER ABLATION  02/17/2016  . COLONOSCOPY  2014  . COLONOSCOPY WITH PROPOFOL N/A 06/30/2016   Procedure: COLONOSCOPY WITH PROPOFOL;  Surgeon: Lucilla Lame, MD;  Location: [place] [place];  Service: Endoscopy;  Laterality: N/A;  . ELECTROPHYSIOLOGIC STUDY N/A 01/04/2016   Procedure: Cardioversion;  Surgeon: Wellington Hampshire, MD;  Location: [facility];  Service: Cardiovascular;  Laterality: N/A;  . ELECTROPHYSIOLOGIC STUDY N/A 02/17/2016   Procedure: A-Flutter Ablation;  Surgeon: Deboraha Sprang, MD;  Location: [place] [facility];  Service: Cardiovascular;  Laterality: N/A;  . GANGLION CYST EXCISION Right 1994   wrist & back  . HERNIA REPAIR Right 1994   abdominal repair with mesh  . NASAL SEPTUM SURGERY  2011   Dr. [name]     Family History  Problem Relation Age of Onset  . Hyperlipidemia Mother   . Cancer Mother        skin cancer?  . Heart disease Mother 41       MI - died in her sleep  . Hyperlipidemia Father   . Heart disease  Father   . Kidney disease Neg Hx   . Prostate cancer Neg Hx   . Kidney cancer Neg Hx   . Bladder Cancer Neg Hx     SOCIAL HX: married with daughter disabled   Current Outpatient Medications:  .  albuterol (VENTOLIN HFA) 108 (90 Base) MCG/ACT inhaler, Inhale 1-2 puffs into the lungs every 4 (four) hours as needed for wheezing or shortness of breath., Disp: 1 Inhaler, Rfl: 0 .  ALPRAZolam (XANAX) 0.5 MG tablet, TAKE 1 TABLET BY MOUTH EVERY NIGHT AT BEDTIME AS NEEDED FOR ANXIETY, Disp: 20 tablet, Rfl: 1 .  atorvastatin (LIPITOR) 10 MG tablet, TAKE 1 TABLET(10 MG) BY MOUTH EVERY MORNING, Disp: 90 tablet, Rfl: 0 .  benzonatate (TESSALON) 100 MG capsule, Take 2 capsules (200 mg total) by mouth 3 (three) times daily as needed for cough.,  Disp: 40 capsule, Rfl: 0 .  cyclobenzaprine (FLEXERIL) 10 MG tablet, TAKE 1 TABLET(10 MG) BY MOUTH THREE TIMES DAILY AS NEEDED FOR MUSCLE SPASMS, Disp: 30 tablet, Rfl: 0 .  escitalopram (LEXAPRO) 10 MG tablet, TAKE 1 TABLET(10 MG) BY MOUTH DAILY, Disp: 90 tablet, Rfl: 1 .  finasteride (PROSCAR) 5 MG tablet, Take 1 tablet (5 mg total) by mouth daily., Disp: 90 tablet, Rfl: 3 .  fluconazole (DIFLUCAN) 150 MG tablet, Take 1 tablet (150 mg total) by mouth once for 1 dose., Disp: 1 tablet, Rfl: 0 .  loratadine (CLARITIN) 10 MG tablet, Take 10 mg by mouth daily as needed for allergies., Disp: , Rfl:  .  losartan (COZAAR) 50 MG tablet, TAKE 1 TABLET(50 MG) BY MOUTH DAILY, Disp: 90 tablet, Rfl: 0 .  meloxicam (MOBIC) 15 MG tablet, TAKE 1 TABLET(15 MG) BY MOUTH DAILY AS NEEDED FOR PAIN, Disp: 90 tablet, Rfl: 0 .  nystatin (MYCOSTATIN) 100000 UNIT/ML suspension, Take 5 mLs (500,000 Units total) by mouth 4 (four) times daily. Rinse hold and spit, Disp: 473 mL, Rfl: 0 .  pantoprazole (PROTONIX) 40 MG tablet, TAKE 1 TABLET(40 MG) BY MOUTH DAILY, Disp: 90 tablet, Rfl: 0 .  tadalafil (CIALIS) 5 MG tablet, Take 1 tablet (5 mg total) by mouth daily., Disp: 90 tablet, Rfl: 3 .  triamcinolone (NASACORT) 55 MCG/ACT AERO nasal inhaler, Place 2 sprays into the nose daily as needed (allergies). , Disp: , Rfl:  .  TRULANCE 3 MG TABS, TAKE 1 TABLET BY MOUTH EVERY DAY, Disp: 30 tablet, Rfl: 3 .  vitamin C (ASCORBIC ACID) 500 MG tablet, Take 1 tablet (500 mg total) by mouth daily. Chewable, Disp: 90 tablet, Rfl: 0  EXAM:  VITALS per patient if applicable:  GENERAL: alert, oriented, appears well and in no acute distress  PSYCH/NEURO: pleasant and cooperative, no obvious depression or anxiety, speech and thought processing grossly intact  ASSESSMENT AND PLAN:  Discussed the following assessment and plan:  COVID-19 - Plan: albuterol (VENTOLIN HFA) 108 (90 Base) MCG/ACT inhaler, benzonatate (TESSALON) 100 MG capsule,  vitamin C (ASCORBIC ACID) 500 MG tablet -if worse disc go to [facility]  -Tylenol  -called pt 06/07/2018 stable still with cough  Cough - Plan: albuterol (VENTOLIN HFA) 108 (90 Base) MCG/ACT inhaler, benzonatate (TESSALON) 100 MG capsule, vitamin C (ASCORBIC ACID) 500 MG tablet -disc Delsym, Robitussin DM (tried but did not like), Mucinex DM green label, Coricidan -warm tea with honey, lemon, ginger/tumeric -halls cough drops  -increase hydration with water   Bronchospasm - Plan: albuterol (VENTOLIN HFA) 108 (90 Base) MCG/ACT inhaler, benzonatate (TESSALON) 100 MG capsule, vitamin C (ASCORBIC ACID)  500 MG tablet  Thrush - Plan: fluconazole (DIFLUCAN) 150 MG tablet, nystatin (MYCOSTATIN) 100000 UNIT/ML suspension  Constipation, unspecified constipation type and abdominal bloating -has sennakot  -disc warm prune juice increase water intake  -BRAT diet  -disc peptobismol to sooth abdominal discomfort otc/upset stomach      I discussed the assessment and treatment plan with the patient. The patient was provided an opportunity to ask questions and all were answered. The patient agreed with the plan and demonstrated an understanding of the instructions.   The patient was advised to call back or seek an in-person evaluation if the symptoms worsen or if the condition fails to improve as anticipated.  Time spent 20 minutes  Delorise Jackson, MD

## 2018-06-06 NOTE — Patient Instructions (Signed)
Bland Diet  A bland diet consists of foods that are often soft and do not have a lot of fat, fiber, or extra seasonings. Foods without fat, fiber, or seasoning are easier for the body to digest. They are also less likely to irritate your mouth, throat, stomach, and other parts of your digestive system. A bland diet is sometimes called a BRAT diet.  What is my plan?  Your health care provider or food and nutrition specialist (dietitian) may recommend specific changes to your diet to prevent symptoms or to treat your symptoms. These changes may include:   Eating small meals often.   Cooking food until it is soft enough to chew easily.   Chewing your food well.   Drinking fluids slowly.   Not eating foods that are very spicy, sour, or fatty.   Not eating citrus fruits, such as oranges and grapefruit.  What do I need to know about this diet?   Eat a variety of foods from the bland diet food list.   Do not follow a bland diet longer than needed.   Ask your health care provider whether you should take vitamins or supplements.  What foods can I eat?  Grains    Hot cereals, such as cream of wheat. Rice. Bread, crackers, or tortillas made from refined white flour.  Vegetables  Canned or cooked vegetables. Mashed or boiled potatoes.  Fruits    Bananas. Applesauce. Other types of cooked or canned fruit with the skin and seeds removed, such as canned peaches or pears.  Meats and other proteins    Scrambled eggs. Creamy peanut butter or other nut butters. Lean, well-cooked meats, such as chicken or fish. Tofu. Soups or broths.  Dairy  Low-fat dairy products, such as milk, cottage cheese, or yogurt.  Beverages    Water. Herbal tea. Apple juice.  Fats and oils  Mild salad dressings. Canola or olive oil.  Sweets and desserts  Pudding. Custard. Fruit gelatin. Ice cream.  The items listed above may not be a complete list of recommended foods and beverages. Contact a dietitian for more options.  What foods are not  recommended?  Grains  Whole grain breads and cereals.  Vegetables  Raw vegetables.  Fruits  Raw fruits, especially citrus, berries, or dried fruits.  Dairy  Whole fat dairy foods.  Beverages  Caffeinated drinks. Alcohol.  Seasonings and condiments  Strongly flavored seasonings or condiments. Hot sauce. Salsa.  Other foods  Spicy foods. Fried foods. Sour foods, such as pickled or fermented foods. Foods with high sugar content. Foods high in fiber.  The items listed above may not be a complete list of foods and beverages to avoid. Contact a dietitian for more information.  Summary   A bland diet consists of foods that are often soft and do not have a lot of fat, fiber, or extra seasonings.   Foods without fat, fiber, or seasoning are easier for the body to digest.   Check with your health care provider to see how long you should follow this diet plan. It is not meant to be followed for long periods.  This information is not intended to replace advice given to you by your health care provider. Make sure you discuss any questions you have with your health care provider.  Document Released: 05/18/2015 Document Revised: 02/22/2017 Document Reviewed: 02/22/2017  Elsevier Interactive Patient Education  2019 Elsevier Inc.

## 2018-06-06 NOTE — Telephone Encounter (Signed)
Pt needs to be seen as I am very concerned with is complaints.   Mclean, we added to your schedule at 2:30pm for doxy.  Rona Ravens will call the  patient to let him know.

## 2018-06-14 ENCOUNTER — Telehealth: Payer: Self-pay

## 2018-06-14 NOTE — Telephone Encounter (Signed)
Copied from [place] [phone]. Topic: General - Inquiry >> Jun 14, 2018  1:53 PM Rainey Pines A wrote: Patient stated that he would like to thank all of the staff for their help and that has made it over [place] -19.

## 2018-07-03 ENCOUNTER — Other Ambulatory Visit: Payer: Self-pay | Admitting: Family Medicine

## 2018-07-03 ENCOUNTER — Other Ambulatory Visit: Payer: Self-pay | Admitting: Urology

## 2018-07-04 ENCOUNTER — Telehealth: Payer: Self-pay

## 2018-07-04 NOTE — Telephone Encounter (Signed)
Incoming request from pt's pharmacy for prior auth on tadalafil. See mychart documentation.

## 2018-07-06 DIAGNOSIS — M5136 Other intervertebral disc degeneration, lumbar region: Secondary | ICD-10-CM | POA: Diagnosis not present

## 2018-07-06 DIAGNOSIS — M5416 Radiculopathy, lumbar region: Secondary | ICD-10-CM | POA: Diagnosis not present

## 2018-07-10 ENCOUNTER — Telehealth: Payer: Self-pay | Admitting: Urology

## 2018-07-10 ENCOUNTER — Other Ambulatory Visit: Payer: Self-pay

## 2018-07-10 DIAGNOSIS — N401 Enlarged prostate with lower urinary tract symptoms: Secondary | ICD-10-CM

## 2018-07-10 DIAGNOSIS — N138 Other obstructive and reflux uropathy: Secondary | ICD-10-CM

## 2018-07-10 DIAGNOSIS — N529 Male erectile dysfunction, unspecified: Secondary | ICD-10-CM

## 2018-07-10 MED ORDER — TADALAFIL 5 MG PO TABS: ORAL_TABLET | ORAL | 3 refills | Status: DC

## 2018-07-10 NOTE — Telephone Encounter (Signed)
[facility] LMOM about prior authorization expiring for Sildenafil 5 mg for pt.  She said to mark [facility] and either send through [place] My Meds or call [phone].  If you have any questions, please call [phone]

## 2018-07-10 NOTE — Telephone Encounter (Signed)
Incoming request from patient to have RX for cialis sent to [facility] instead of [facility]. RX changed. Patient made aware of potential coverage issues.

## 2018-07-11 NOTE — Telephone Encounter (Signed)
PA has not been received. Will have it completed upon receiving.

## 2018-07-16 ENCOUNTER — Telehealth: Payer: Self-pay | Admitting: Urology

## 2018-07-16 NOTE — Telephone Encounter (Signed)
[place] [employer] called on behalf of the patient to request the renewal of a prior authorization for finasteride. Insurance asserts prior Josem Kaufmann has to be renewed through Cover My Meds.

## 2018-07-17 NOTE — Telephone Encounter (Signed)
Spoke w/Lacy from [employer] prior auth not needed on finasteride , but is needed on Tadalafil 5mg , verbal prior Josem Kaufmann was done over the phone sent for review Reference [id number]

## 2018-07-25 ENCOUNTER — Ambulatory Visit: Payer: Self-pay | Admitting: Family Medicine

## 2018-07-25 NOTE — Telephone Encounter (Signed)
Pt. Asking if Dr. Caryl Bis does COVID 19 antibody test, or would he order one. States he had COVID 19 "a couple of months ago." Please advise pt.

## 2018-07-27 DIAGNOSIS — M5416 Radiculopathy, lumbar region: Secondary | ICD-10-CM | POA: Diagnosis not present

## 2018-07-27 DIAGNOSIS — M5136 Other intervertebral disc degeneration, lumbar region: Secondary | ICD-10-CM | POA: Diagnosis not present

## 2018-07-28 ENCOUNTER — Other Ambulatory Visit: Payer: Self-pay | Admitting: Family Medicine

## 2018-07-30 NOTE — Telephone Encounter (Signed)
I am happy to order this test for the patient if he would like this though we know that he had coronavirus given that he tested positive.  We do not know how to accurately interpret a positive antibody test at this time given that we are unsure whether or not this would indicate that he would be immune from getting COVID-19 again.  I am also unsure whether or not his insurance company would pay for the antibody test and I am not sure that it would be useful to test him given that he has already tested positive for coronavirus.

## 2018-08-02 ENCOUNTER — Other Ambulatory Visit: Payer: Self-pay | Admitting: Family Medicine

## 2018-08-03 NOTE — Telephone Encounter (Signed)
Pt stated that his wife spoke to you earlier this week and he is not going to do the test after all.  Nina,cma

## 2018-08-27 DIAGNOSIS — M5136 Other intervertebral disc degeneration, lumbar region: Secondary | ICD-10-CM | POA: Diagnosis not present

## 2018-08-27 DIAGNOSIS — M47816 Spondylosis without myelopathy or radiculopathy, lumbar region: Secondary | ICD-10-CM | POA: Diagnosis not present

## 2018-08-27 DIAGNOSIS — M5412 Radiculopathy, cervical region: Secondary | ICD-10-CM | POA: Diagnosis not present

## 2018-08-27 DIAGNOSIS — M5416 Radiculopathy, lumbar region: Secondary | ICD-10-CM | POA: Diagnosis not present

## 2018-08-27 DIAGNOSIS — M5030 Other cervical disc degeneration, unspecified cervical region: Secondary | ICD-10-CM | POA: Diagnosis not present

## 2018-08-27 DIAGNOSIS — M4726 Other spondylosis with radiculopathy, lumbar region: Secondary | ICD-10-CM | POA: Diagnosis not present

## 2018-09-04 ENCOUNTER — Ambulatory Visit: Payer: Medicare Other | Admitting: Family Medicine

## 2018-09-06 DIAGNOSIS — M5136 Other intervertebral disc degeneration, lumbar region: Secondary | ICD-10-CM | POA: Diagnosis not present

## 2018-09-06 DIAGNOSIS — M5416 Radiculopathy, lumbar region: Secondary | ICD-10-CM | POA: Diagnosis not present

## 2018-09-07 ENCOUNTER — Other Ambulatory Visit: Payer: Self-pay | Admitting: Family Medicine

## 2018-09-10 ENCOUNTER — Other Ambulatory Visit: Payer: Self-pay

## 2018-09-11 ENCOUNTER — Other Ambulatory Visit: Payer: Self-pay | Admitting: Family Medicine

## 2018-09-11 ENCOUNTER — Telehealth: Payer: Self-pay

## 2018-09-11 MED ORDER — ALPRAZOLAM 0.5 MG PO TABS: ORAL_TABLET | ORAL | 1 refills | Status: DC

## 2018-09-11 NOTE — Telephone Encounter (Signed)
Sent to pharmacy 

## 2018-09-12 ENCOUNTER — Encounter: Payer: Self-pay | Admitting: Family Medicine

## 2018-09-12 ENCOUNTER — Other Ambulatory Visit: Payer: Self-pay

## 2018-09-12 ENCOUNTER — Ambulatory Visit (INDEPENDENT_AMBULATORY_CARE_PROVIDER_SITE_OTHER): Payer: Medicare Other | Admitting: Family Medicine

## 2018-09-12 VITALS — BP 120/80 | HR 101 | Temp 98.8°F | Ht 70.0 in | Wt 202.4 lb

## 2018-09-12 DIAGNOSIS — F4323 Adjustment disorder with mixed anxiety and depressed mood: Secondary | ICD-10-CM | POA: Diagnosis not present

## 2018-09-12 DIAGNOSIS — M5442 Lumbago with sciatica, left side: Secondary | ICD-10-CM | POA: Diagnosis not present

## 2018-09-12 DIAGNOSIS — R143 Flatulence: Secondary | ICD-10-CM

## 2018-09-12 DIAGNOSIS — M5441 Lumbago with sciatica, right side: Secondary | ICD-10-CM

## 2018-09-12 DIAGNOSIS — G8929 Other chronic pain: Secondary | ICD-10-CM

## 2018-09-12 DIAGNOSIS — I10 Essential (primary) hypertension: Secondary | ICD-10-CM

## 2018-09-12 LAB — BASIC METABOLIC PANEL
BUN: 13 mg/dL (ref 6–23)
CO2: 26 mEq/L (ref 19–32)
Calcium: 9.6 mg/dL (ref 8.4–10.5)
Chloride: 105 mEq/L (ref 96–112)
Creatinine, Ser: 0.84 mg/dL (ref 0.40–1.50)
GFR: 92.48 mL/min (ref >60.00)
Glucose, Bld: 106 mg/dL — ABNORMAL HIGH (ref 70–99)
Potassium: 4.1 mEq/L (ref 3.5–5.1)
Sodium: 139 mEq/L (ref 135–145)

## 2018-09-12 NOTE — Assessment & Plan Note (Addendum)
I encouraged him to see GI for follow-up.

## 2018-09-12 NOTE — Assessment & Plan Note (Signed)
Well-controlled.  Continue losartan.  Check BMP

## 2018-09-12 NOTE — Assessment & Plan Note (Signed)
He will continue to see physical medicine and rehab.

## 2018-09-12 NOTE — Patient Instructions (Signed)
Nice to see you. We will check labs today.  If you ever change your mind about seeing a therapist please let us know.  Please touch base with GI to see about an appointment for your gas and belching.

## 2018-09-12 NOTE — Assessment & Plan Note (Signed)
Patient continues to have issues with these things.  Rarely uses Xanax.  Lexapro has been beneficial.  I offered support today.  Discussed that therapy would likely be beneficial for him though he declines this.  I advised he could contact us at any point to undergo therapy.  Discussed that if he ever develop suicidal ideation he should go to the emergency room.

## 2018-09-12 NOTE — Progress Notes (Signed)
Tyler Rumps, MD Phone: [phone]  [name]. is a 63 y.o. male who presents today for follow-up.  Anxiety/depression: Patient notes he chronically feels depressed.  He notes he has been dealing with this for decades.  He has had a lot of stuff go on in his past that he continually deals with and thinks about.  He requests that these things not be listed in his chart.  He worries about his daughter and how long he is going to live so he can spend time with her.  He denies SI.  He feels as though the Lexapro does help.  He declines counseling or therapy services noting that he has not want to talk to anybody about things.  He wants to continue with his current regimen.  Hypertension: Less than 120/80 at home.  Taking losartan.  No chest pain, shortness of breath, or edema.  Chronic back pain: He has been getting injections through physical medicine and rehab.  He notes those have been beneficial.  He continues on Flexeril.  He notes they wanted to start him on gabapentin though he is waiting to start on this until he sees how the injection works long-term.  Excessive gas: He continues to have issues with swallowing air and belching lots.  He has seen GI who advised him that he was swallowing air.  Social History   Tobacco Use  Smoking Status Former Smoker  . Years: 20.00  . Types: Cigarettes  . Quit date: 02/08/1991  . Years since quitting: 27.6  Smokeless Tobacco Never Used     ROS see history of present illness  Objective  Physical Exam Vitals:   09/12/18 1119  BP: 120/80  Pulse: (!) 101  Temp: 98.8 F (37.1 C)  SpO2: 98%    BP Readings from Last 3 Encounters:  09/12/18 120/80  05/01/18 120/86  01/16/18 120/80   Wt Readings from Last 3 Encounters:  09/12/18 202 lb 6.4 oz (91.8 kg)  05/01/18 206 lb 3.2 oz (93.5 kg)  01/16/18 203 lb 12.8 oz (92.4 kg)    Physical Exam Constitutional:      General: He is not in acute distress.    Appearance: He is not  diaphoretic.  Cardiovascular:     Rate and Rhythm: Normal rate and regular rhythm.     Heart sounds: Normal heart sounds.  Pulmonary:     Effort: Pulmonary effort is normal.     Breath sounds: Normal breath sounds.  Abdominal:     General: Bowel sounds are normal. There is no distension.     Palpations: Abdomen is soft.     Tenderness: There is no abdominal tenderness. There is no guarding or rebound.  Musculoskeletal:     Right lower leg: No edema.     Left lower leg: No edema.  Skin:    General: Skin is warm and dry.  Neurological:     Mental Status: He is alert.      Assessment/Plan: Please see individual problem list.  Adjustment disorder with mixed anxiety and depressed mood Patient continues to have issues with these things.  Rarely uses Xanax.  Lexapro has been beneficial.  I offered support today.  Discussed that therapy would likely be beneficial for him though he declines this.  I advised he could contact us at any point to undergo therapy.  Discussed that if he ever develop suicidal ideation he should go to the emergency room.  Chronic low back pain He will continue to see  physical medicine and rehab.  Excessive gas I encouraged him to see GI for follow-up.  Hypertension Well-controlled.  Continue losartan.  Check BMP   Orders Placed This Encounter  Procedures  . Basic metabolic panel    No orders of the defined types were placed in this encounter.    Tyler Rumps, MD [place]

## 2018-09-26 DIAGNOSIS — M47816 Spondylosis without myelopathy or radiculopathy, lumbar region: Secondary | ICD-10-CM | POA: Diagnosis not present

## 2018-09-26 DIAGNOSIS — M4726 Other spondylosis with radiculopathy, lumbar region: Secondary | ICD-10-CM | POA: Diagnosis not present

## 2018-09-26 DIAGNOSIS — M5412 Radiculopathy, cervical region: Secondary | ICD-10-CM | POA: Diagnosis not present

## 2018-09-26 DIAGNOSIS — M47812 Spondylosis without myelopathy or radiculopathy, cervical region: Secondary | ICD-10-CM | POA: Diagnosis not present

## 2018-09-26 DIAGNOSIS — M5030 Other cervical disc degeneration, unspecified cervical region: Secondary | ICD-10-CM | POA: Diagnosis not present

## 2018-09-26 DIAGNOSIS — M5416 Radiculopathy, lumbar region: Secondary | ICD-10-CM | POA: Diagnosis not present

## 2018-09-26 DIAGNOSIS — M5136 Other intervertebral disc degeneration, lumbar region: Secondary | ICD-10-CM | POA: Diagnosis not present

## 2018-10-01 ENCOUNTER — Other Ambulatory Visit: Payer: Self-pay | Admitting: Family Medicine

## 2018-10-22 ENCOUNTER — Telehealth: Payer: Self-pay

## 2018-10-22 NOTE — Telephone Encounter (Signed)
Copied from [place] [phone]. Topic: General - Other >> Oct 22, 2018  1:20 PM Keene Breath wrote: Reason for CRM: Patient called to inform the office that all his 75 prescriptions should be sent through [facility]

## 2018-10-23 NOTE — Telephone Encounter (Signed)
Patient called to inform the office that all his 27  Day prescriptions should be sent through [facility]

## 2018-10-24 ENCOUNTER — Other Ambulatory Visit: Payer: Self-pay | Admitting: Family Medicine

## 2018-10-24 DIAGNOSIS — R972 Elevated prostate specific antigen [PSA]: Secondary | ICD-10-CM

## 2018-10-24 MED ORDER — FINASTERIDE 5 MG PO TABS: 5.0000 mg | ORAL_TABLET | Freq: Every day | ORAL | 3 refills | Status: DC

## 2018-10-30 ENCOUNTER — Other Ambulatory Visit: Payer: Self-pay

## 2018-10-30 MED ORDER — ATORVASTATIN CALCIUM 10 MG PO TABS: ORAL_TABLET | ORAL | 0 refills | Status: DC

## 2018-11-01 ENCOUNTER — Other Ambulatory Visit: Payer: Self-pay | Admitting: Family Medicine

## 2018-11-01 NOTE — Telephone Encounter (Signed)
Requested medication (s) are due for refill today: yes  Requested medication (s) are on the active medication list: yes  Last refill:  09/11/2018  Future visit scheduled: yes  Notes to clinic:  Refill cannot be delegated   Requested Prescriptions  Pending Prescriptions Disp Refills   ALPRAZolam (XANAX) 0.5 MG tablet 20 tablet 1    Sig: TAKE 1 TABLET BY MOUTH EVERY NIGHT AT BEDTIME AS NEEDED FOR ANXIETY     Not Delegated - Psychiatry:  Anxiolytics/Hypnotics Failed - 11/01/2018 12:08 PM      Failed - This refill cannot be delegated      Failed - Urine Drug Screen completed in last 360 days.      Passed - Valid encounter within last 6 months    Recent Outpatient Visits          1 month ago Essential hypertension   [place], Angela Adam, MD   4 months ago COVID-19   [place], Nino Glow, MD   6 months ago Essential hypertension   [place], Angela Adam, MD   9 months ago Hyperlipidemia, unspecified hyperlipidemia type   [facility], Angela Adam, MD   1 year ago Essential hypertension   [place], Angela Adam, MD      Future Appointments            In 3 weeks McGowan, Gordan Payment [place]   In 2 months Caryl Bis, Angela Adam, MD [place], [facility]            cyclobenzaprine (FLEXERIL) 10 MG tablet 90 tablet 0     Not Delegated - Analgesics:  Muscle Relaxants Failed - 11/01/2018 12:08 PM      Failed - This refill cannot be delegated      Passed - Valid encounter within last 6 months    Recent Outpatient Visits          1 month ago Essential hypertension   [place], Angela Adam, MD   4 months ago [place], Nino Glow, MD   6 months ago Essential hypertension   [place], Eric  G, MD   9 months ago Hyperlipidemia, unspecified hyperlipidemia type   [facility] [name], MD   1 year ago Essential hypertension   [place], Angela Adam, MD      Future Appointments            In 3 weeks McGowan, Gordan Payment [place]   In 2 months Caryl Bis, Angela Adam, MD [place], [facility]            escitalopram (LEXAPRO) 10 MG tablet 90 tablet 1     Psychiatry:  Antidepressants - SSRI Failed - 11/01/2018 12:08 PM      Failed - Completed PHQ-2 or PHQ-9 in the last 360 days.      Passed - Valid encounter within last 6 months    Recent Outpatient Visits          1 month ago Essential hypertension   [place], Angela Adam, MD   4 months ago COVID-23   [facility] [place], Nino Glow, MD   6 months ago Essential hypertension   [facility] [place], Angela Adam, MD  9 months ago Hyperlipidemia, unspecified hyperlipidemia type   [facility] [place], Angela Adam, MD   1 year ago Essential hypertension   [place], Angela Adam, MD      Future Appointments            In 3 weeks McGowan, Gordan Payment [place]   In 2 months Caryl Bis, Angela Adam, MD [facility]

## 2018-11-01 NOTE — Telephone Encounter (Signed)
escitalopram (LEXAPRO) 10 MG tablet  cyclobenzaprine (FLEXERIL) 10 MG tablet  [facility]  ALPRAZolam (XANAX) 0.5 MG tablet---pt want a 90day supply  If 90 days can't be done then he want this medication to go to [facility]

## 2018-11-02 MED ORDER — CYCLOBENZAPRINE HCL 10 MG PO TABS: ORAL_TABLET | ORAL | 0 refills | Status: DC

## 2018-11-02 MED ORDER — ESCITALOPRAM OXALATE 10 MG PO TABS: ORAL_TABLET | ORAL | 1 refills | Status: DC

## 2018-11-02 MED ORDER — ALPRAZOLAM 0.5 MG PO TABS: ORAL_TABLET | ORAL | 1 refills | Status: DC

## 2018-11-20 ENCOUNTER — Other Ambulatory Visit: Payer: Self-pay

## 2018-11-20 DIAGNOSIS — R972 Elevated prostate specific antigen [PSA]: Secondary | ICD-10-CM

## 2018-11-21 ENCOUNTER — Other Ambulatory Visit: Payer: Medicare Other

## 2018-11-21 ENCOUNTER — Other Ambulatory Visit: Payer: Self-pay

## 2018-11-21 DIAGNOSIS — R972 Elevated prostate specific antigen [PSA]: Secondary | ICD-10-CM | POA: Diagnosis not present

## 2018-11-22 LAB — PSA: Prostate Specific Ag, Serum: 1.8 ng/mL (ref 0.0–4.0)

## 2018-11-22 NOTE — Progress Notes (Addendum)
8:49 AM   [name]. 06-25-1955 [id number]  Referring provider: [name], MD [address] [place],  [place] [address]  Chief Complaint  Patient presents with  . Benign Prostatic Hypertrophy    HPI: [name] is a 63 year old male with BPH, OAB, nocturia, PFD and ED who presents today for one year follow up.  BPH WITH LUTS  (prostate and/or bladder) IPSS score: 21/3   PVR: 66 mL    Previous score: 20/3   Previous PVR: 18 mL  Major complaint(s): Frequency (every two hours), urgency (when he holds the urine too long), nocturia x 4-5, intermittency (in the evenings) and weak stream x several  years. Denies any dysuria, hematuria or suprapubic pain.   Restricts fluids: drinks two cans of sodas, big glass of water with Metamucil, unsweet tea, a small cup of coffee and a small glass of orange juice.  He has sleep apnea and does not sleep with the CPAP due to claustrophobia.    Currently taking: Finasteride 5 mg daily and Cialis 5 mg daily.     Found Myrbetriq cost prohibitive and ineffective.  PT was cost prohibitive.  Per patient, had a cystoscopy several years ago and he will never have another one as it was very painful for him.  He states that nothing was found.   Denies any recent fevers, chills, nausea or vomiting.  He does not have a family history of PCa.  IPSS    Row Name 11/23/18 0800         International Prostate Symptom Score   How often have you had the sensation of not emptying your bladder?  About half the time     How often have you had to urinate less than every two hours?  More than half the time     How often have you found you stopped and started again several times when you urinated?  Less than half the time     How often have you found it difficult to postpone urination?  About half the time     How often have you had a weak urinary stream?  About half the time     How often have you had to strain to start urination?  Less than  half the time     How many times did you typically get up at night to urinate?  4 Times     Total IPSS Score  21       Quality of Life due to urinary symptoms   If you were to spend the rest of your life with your urinary condition just the way it is now how would you feel about that?  Mixed        Score:  1-7 Mild 8-19 Moderate 20-35 Severe  Nocturia 3 to 4 times a night.   Cannot tolerate CPAP.  Dental device was not tolerated.    Erectile dysfunction His SHIM score is 19, which is mild ED.   His previous SHIM score was 15.  He has been having difficulty with erections for several years.   His major complaint is no sensation.  His libido is diminished.   His risk factors for ED are age, BPH, HTN, HLD, sleep apnea and depression.  He states he has not noticed as much as a curve as before and his erections are not painful.  His libido is severely diminished, likely due to depression and anxiety.   SHIM  [place] Name 11/23/18 0819         SHIM: Over the last 6 months:   How do you rate your confidence that you could get and keep an erection?  Very Low     When you had erections with sexual stimulation, how often were your erections hard enough for penetration (entering your partner)?  Almost Never or Never     During sexual intercourse, how often were you able to maintain your erection after you had penetrated (entered) your partner?  A Few Times (much less than half the time)     During sexual intercourse, how difficult was it to maintain your erection to completion of intercourse?  Difficult     When you attempted sexual intercourse, how often was it satisfactory for you?  A Few Times (much less than half the time)       SHIM Total Score   SHIM  9        Score: 1-7 Severe ED 8-11 Moderate ED 12-16 Mild-Moderate ED 17-21 Mild ED 22-25 No ED   He states he continues to have pain in his left scrotum off and on.  He describes it more as a discomfort.  He has had the epididymal  cysts for years.  He has not found anything that helps the pain or makes it worse.  The cysts have not changed significantly since they were first discovered.    PMH: Past Medical History:  Diagnosis Date  . Anxiety   . Arthritis   . BPH (benign prostatic hyperplasia) 11/19/2013  . 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 Bulging disc    C2/3, C3/4, C6/7  . Cervical disc herniation    C4/5 and C5/6  . Colon polyp 04/18/2011  . Constipation 08/31/2012  . Depression   . Diverticulosis   . 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 GERD (gastroesophageal reflux disease)   . H/O diverticulitis of colon   . Hemorrhoid   . Hiatal hernia   . Hyperlipidemia   . Levoscoliosis   . Sleep difficulties   . Spinal stenosis in cervical region    cord abutment C4/5  . Testicular pain, left   . Typical atrial flutter ([place]) 11/2015   a. CHADS2VASc => 0; b. on Eliquis for pending ablation     Surgical History: Past Surgical History:  Procedure Laterality Date  . ATRIAL FLUTTER ABLATION  02/17/2016  . COLONOSCOPY  2014  . COLONOSCOPY WITH PROPOFOL N/A 06/30/2016   Procedure: COLONOSCOPY WITH PROPOFOL;  Surgeon: Lucilla Lame, MD;  Location: [facility];  Service: Endoscopy;  Laterality: N/A;  . ELECTROPHYSIOLOGIC STUDY N/A 01/04/2016   Procedure: Cardioversion;  Surgeon: Wellington Hampshire, MD;  Location: [facility];  Service: Cardiovascular;  Laterality: N/A;  . ELECTROPHYSIOLOGIC STUDY N/A 02/17/2016   Procedure: A-Flutter Ablation;  Surgeon: Deboraha Sprang, MD;  Location: [facility];  Service: Cardiovascular;  Laterality: N/A;  . GANGLION CYST EXCISION Right 1994   wrist & back  . HERNIA REPAIR Right 1994   abdominal repair with mesh  . NASAL SEPTUM SURGERY  2011   Dr. [name]     Home Medications:  Allergies as of 11/23/2018      Reactions   Other Other (See Comments)   Pollen:  Sinus infection, cough, fatigue      Medication  List       Accurate as of November 23, 2018  8:49 AM.  If you have any questions, ask your nurse or doctor.        albuterol 108 (90 Base) MCG/ACT inhaler Commonly known as: VENTOLIN HFA Inhale 1-2 puffs into the lungs every 4 (four) hours as needed for wheezing or shortness of breath.   ALPRAZolam 0.5 MG tablet Commonly known as: XANAX TAKE 1 TABLET BY MOUTH EVERY NIGHT AT BEDTIME AS NEEDED FOR ANXIETY   atorvastatin 10 MG tablet Commonly known as: LIPITOR TAKE 1 TABLET(10 MG) BY MOUTH EVERY MORNING   cyclobenzaprine 10 MG tablet Commonly known as: FLEXERIL TAKE 1 TABLET(10 MG) BY MOUTH THREE TIMES DAILY AS NEEDED FOR MUSCLE SPASMS   escitalopram 10 MG tablet Commonly known as: LEXAPRO TAKE 1 TABLET(10 MG) BY MOUTH DAILY   finasteride 5 MG tablet Commonly known as: PROSCAR Take 1 tablet (5 mg total) by mouth daily.   gabapentin 300 MG capsule Commonly known as: NEURONTIN   loratadine 10 MG tablet Commonly known as: CLARITIN Take 10 mg by mouth daily as needed for allergies.   losartan 50 MG tablet Commonly known as: COZAAR TAKE 1 TABLET(50 MG) BY MOUTH DAILY   meloxicam 15 MG tablet Commonly known as: MOBIC TAKE 1 TABLET(15 MG) BY MOUTH DAILY AS NEEDED FOR PAIN   nystatin 100000 UNIT/ML suspension Commonly known as: MYCOSTATIN Take 5 mLs (500,000 Units total) by mouth 4 (four) times daily. Rinse hold and spit   pantoprazole 40 MG tablet Commonly known as: PROTONIX TAKE 1 TABLET(40 MG) BY MOUTH DAILY   tadalafil 5 MG tablet Commonly known as: CIALIS TAKE 1 TABLET(5 MG) BY MOUTH DAILY   triamcinolone 55 MCG/ACT Aero nasal inhaler Commonly known as: NASACORT Place 2 sprays into the nose daily as needed (allergies).   Trulance 3 MG Tabs Generic drug: Plecanatide TAKE 1 TABLET BY MOUTH EVERY DAY   vitamin C 500 MG tablet Commonly known as: ASCORBIC ACID Take 1 tablet (500 mg total) by mouth daily. Chewable       Allergies:  Allergies  Allergen  Reactions  . Other Other (See Comments)    Pollen:  Sinus infection, cough, fatigue     Family History: Family History  Problem Relation Age of Onset  . Hyperlipidemia Mother   . Cancer Mother        skin cancer?  . Heart disease Mother 33       MI - died in her sleep  . Hyperlipidemia Father   . Heart disease Father   . Kidney disease Neg Hx   . Prostate cancer Neg Hx   . Kidney cancer Neg Hx   . Bladder Cancer Neg Hx     Social History:  reports that he quit smoking about 27 years ago. His smoking use included cigarettes. He quit after 20.00 years of use. He has never used smokeless tobacco. He reports that he does not drink alcohol or use drugs.  ROS: UROLOGY Frequent Urination?: Yes Hard to postpone urination?: Yes Burning/pain with urination?: No Get up at night to urinate?: Yes Leakage of urine?: No Urine stream starts and stops?: Yes Trouble starting stream?: No Do you have to strain to urinate?: No Blood in urine?: No Urinary tract infection?: No Sexually transmitted disease?: No Injury to kidneys or bladder?: No Painful intercourse?: No Weak stream?: Yes Erection problems?: Yes Penile pain?: No  Gastrointestinal Nausea?: No Vomiting?: No Indigestion/heartburn?: Yes Diarrhea?: No Constipation?: Yes  Constitutional Fever: No Night sweats?: No Weight loss?: No Fatigue?: Yes  Skin Skin rash/lesions?: No Itching?:  No  Eyes Blurred vision?: No Double vision?: No  Ears/Nose/Throat Sore throat?: No Sinus problems?: No  Hematologic/Lymphatic Swollen glands?: No Easy bruising?: No  Cardiovascular Leg swelling?: No Chest pain?: No  Respiratory Cough?: Yes Shortness of breath?: No  Endocrine Excessive thirst?: No  Musculoskeletal Back pain?: Yes Joint pain?: No  Neurological Headaches?: No Dizziness?: No  Psychologic Depression?: Yes Anxiety?: Yes  Physical Exam: BP 137/84   Pulse 85   Ht 5\' 10"  (1.778 m)   Wt 203 lb  (92.1 kg)   BMI 29.13 kg/m   Constitutional:  Well nourished. Alert and oriented, No acute distress. HEENT: [place] AT, moist mucus membranes.  Trachea midline, no masses. Cardiovascular: No clubbing, cyanosis, or edema. Respiratory: Normal respiratory effort, no increased work of breathing. GI: Abdomen is soft, non tender, non distended, no abdominal masses. Liver and spleen not palpable.  No hernias appreciated.  Stool sample for occult testing is not indicated.   GU: No CVA tenderness.  No bladder fullness or masses.  Patient with circumcised phallus.  Urethral meatus is patent.  No penile discharge. No penile lesions or rashes. Scrotum without lesions, cysts, rashes and/or edema.  Testicles are located scrotally bilaterally. No masses are appreciated in the testicles. Left and right epididymis are normal.  Small left and right epididymal cysts are noted.   Rectal: Patient with  normal sphincter tone. Anus and perineum without scarring or rashes. No rectal masses are appreciated. Prostate is approximately 45 grams, could only palpate the apex and the midportion, no nodules are appreciated. Seminal vesicles could not be palpated.   Skin: No rashes, bruises or suspicious lesions. Lymph: No inguinal adenopathy. Neurologic: Grossly intact, no focal deficits, moving all 4 extremities. Psychiatric: Normal mood and affect.  Laboratory Data: Lab Results  Component Value Date   WBC 6.6 02/22/2017   HGB 15.2 02/22/2017   HCT 44.5 02/22/2017   MCV 87.3 02/22/2017   PLT 207 02/22/2017    Lab Results  Component Value Date   CREATININE 0.84 09/12/2018    Lab Results  Component Value Date   PSA 3.49 11/19/2013   Component     Latest Ref Rng & Units 11/24/2014 11/23/2015 11/23/2015 12/28/2015          8:20 AM  8:20 AM   Prostate Specific Ag, Serum     0.0 - 4.0 ng/mL 3.6 4.8 (H) 4.4 (H) 2.8   Component     Latest Ref Rng & Units 11/16/2016 11/20/2017 11/21/2018            Prostate Specific  Ag, Serum     0.0 - 4.0 ng/mL 1.9 1.6 1.8   Started finasteride 5 mg daily in 2017  Lab Results  Component Value Date   TSH 0.585 11/29/2015       Component Value Date/Time   CHOL 167 01/16/2018 1132   HDL 50.40 01/16/2018 1132   CHOLHDL 3 01/16/2018 1132   VLDL 37.8 01/16/2018 1132   LDLCALC 79 01/16/2018 1132   I have reviewed the labs.  Pertinent Imaging Results for KACIN, CESARE (MRN [id number]) as of 11/23/2018 08:21  Ref. Range 11/23/2018 08:12  Scan Result Unknown 35mL     Assessment & Plan:    1. BPH (benign prostatic hyperplasia) with LUTS:     IPSS score is 21/3, it is improved Continue the finasteride 5 mg and Cialis 5 mg; refills given PSA 1.8 (3.6)  RTC in 12 months for IPSS, PSA and PVR  2. Nocturia  May consider getting another test to get fitted with a different CPAP machine  3. ED SHIM score is 19, it is improved  Continue Cialis 5 mg daily He is not very sexually active at this time due to low libido  RTC in one year for SHIM   4. Epididymal cysts Patient may want to consider a surgical procedure in the future He will call back if he decides to move forward with surgery and will call back to schedule a scrotal ultrasound if he decides to talk about surgery with one of the physicians   Return in about 1 year (around 11/23/2019) for IPSS, SHIM, PSA and exam.  These notes generated with voice recognition software. I apologize for typographical errors.  Zara Council, [name]  [facility] [address] [place], [place] [address] [phone]

## 2018-11-23 ENCOUNTER — Other Ambulatory Visit: Payer: Self-pay

## 2018-11-23 ENCOUNTER — Ambulatory Visit (INDEPENDENT_AMBULATORY_CARE_PROVIDER_SITE_OTHER): Payer: Medicare Other | Admitting: Urology

## 2018-11-23 ENCOUNTER — Encounter: Payer: Self-pay | Admitting: Urology

## 2018-11-23 VITALS — BP 137/84 | HR 85 | Ht 70.0 in | Wt 203.0 lb

## 2018-11-23 DIAGNOSIS — N401 Enlarged prostate with lower urinary tract symptoms: Secondary | ICD-10-CM

## 2018-11-23 DIAGNOSIS — N529 Male erectile dysfunction, unspecified: Secondary | ICD-10-CM | POA: Diagnosis not present

## 2018-11-23 DIAGNOSIS — R351 Nocturia: Secondary | ICD-10-CM | POA: Diagnosis not present

## 2018-11-23 DIAGNOSIS — N138 Other obstructive and reflux uropathy: Secondary | ICD-10-CM

## 2018-11-23 LAB — BLADDER SCAN AMB NON-IMAGING

## 2018-12-11 ENCOUNTER — Other Ambulatory Visit: Payer: Self-pay | Admitting: Family Medicine

## 2019-01-07 ENCOUNTER — Telehealth: Payer: Self-pay | Admitting: Family Medicine

## 2019-01-07 NOTE — Telephone Encounter (Signed)
Patient requesting 90 day supply escitalopram (LEXAPRO) 10 MG tablet ,  losartan (COZAAR) 50 MG tablet and meloxicam (MOBIC) 15 MG tablet verbal order [phone] option #1  Fax # [phone] . Patient states mail order pharmacy has reached out several times

## 2019-01-08 MED ORDER — LOSARTAN POTASSIUM 50 MG PO TABS: ORAL_TABLET | ORAL | 1 refills | Status: DC

## 2019-01-08 MED ORDER — ESCITALOPRAM OXALATE 10 MG PO TABS: ORAL_TABLET | ORAL | 1 refills | Status: DC

## 2019-01-08 MED ORDER — MELOXICAM 15 MG PO TABS: ORAL_TABLET | ORAL | 0 refills | Status: DC

## 2019-01-08 NOTE — Telephone Encounter (Signed)
I have sent a refill to his pharmacy.  It does not look like we have received a refill request regarding this.

## 2019-01-08 NOTE — Telephone Encounter (Signed)
I called and spoke with the patient and informed him that the 3 medications requested was sent to the pharmacy.  Nina,cma

## 2019-01-15 ENCOUNTER — Other Ambulatory Visit: Payer: Self-pay

## 2019-01-15 ENCOUNTER — Ambulatory Visit (INDEPENDENT_AMBULATORY_CARE_PROVIDER_SITE_OTHER): Payer: Medicare Other | Admitting: Family Medicine

## 2019-01-15 ENCOUNTER — Encounter: Payer: Self-pay | Admitting: Family Medicine

## 2019-01-15 VITALS — Ht 70.0 in | Wt 200.0 lb

## 2019-01-15 DIAGNOSIS — R143 Flatulence: Secondary | ICD-10-CM | POA: Diagnosis not present

## 2019-01-15 DIAGNOSIS — E785 Hyperlipidemia, unspecified: Secondary | ICD-10-CM

## 2019-01-15 DIAGNOSIS — F4323 Adjustment disorder with mixed anxiety and depressed mood: Secondary | ICD-10-CM

## 2019-01-15 DIAGNOSIS — G4730 Sleep apnea, unspecified: Secondary | ICD-10-CM | POA: Diagnosis not present

## 2019-01-15 DIAGNOSIS — R7309 Other abnormal glucose: Secondary | ICD-10-CM

## 2019-01-15 NOTE — Progress Notes (Signed)
Virtual Visit via telephone Note  This visit type was conducted due to national recommendations for restrictions regarding the COVID-19 pandemic (e.g. social distancing).  This format is felt to be most appropriate for this patient at this time.  All issues noted in this document were discussed and addressed.  No physical exam was performed (except for noted visual exam findings with Video Visits).   I connected with [name] today at 11:00 AM EST by telephone and verified that I am speaking with the correct person using two identifiers. Location patient: home Location provider: home office Persons participating in the virtual visit: patient, provider  I discussed the limitations, risks, security and privacy concerns of performing an evaluation and management service by telephone and the availability of in person appointments. I also discussed with the patient that there may be a patient responsible charge related to this service. The patient expressed understanding and agreed to proceed.  Interactive audio and video telecommunications were attempted between this provider and patient, however failed, due to patient having technical difficulties OR patient did not have access to video capability.  We continued and completed visit with audio only.   Reason for visit: follow-up  HPI: HYPERLIPIDEMIA Symptoms Chest pain on exertion:  no   Medications: Compliance- taking lipitor Right upper quadrant pain- no  Muscle aches- no  OSA: No longer using an oral device.  He notes he had a lot of issues with his jaw hurting and TMJ with this.  They adjusted this multiple times though he was unable to tolerate it.  He is unable to tolerate a CPAP.  He does note hypersomnia most days.  Anxiety/depression: Notes his anxiety is okay.  He notes some depression if something bothers him or sets him off though nothing significant.  Lexapro has been beneficial.  He rarely takes Xanax.  No SI.  Gas issues:  He is seeing GI in January.  He was unable to tolerate the Trulance as he had extra gas.  Senokot and MiraLAX are beneficial to help him have bowel movements.   ROS: See pertinent positives and negatives per HPI.  Past Medical History:  Diagnosis Date  . Anxiety   . Arthritis   . BPH (benign prostatic hyperplasia) 11/19/2013  . 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 Bulging disc    C2/3, C3/4, C6/7  . Cervical disc herniation    C4/5 and C5/6  . Colon polyp 04/18/2011  . Constipation 08/31/2012  . Depression   . Diverticulosis   . 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 GERD (gastroesophageal reflux disease)   . H/O diverticulitis of colon   . Hemorrhoid   . Hiatal hernia   . Hyperlipidemia   . Levoscoliosis   . Sleep difficulties   . Spinal stenosis in cervical region    cord abutment C4/5  . Testicular pain, left   . Typical atrial flutter ([place]) 11/2015   a. CHADS2VASc => 0; b. on Eliquis for pending ablation     Past Surgical History:  Procedure Laterality Date  . ATRIAL FLUTTER ABLATION  02/17/2016  . COLONOSCOPY  2014  . COLONOSCOPY WITH PROPOFOL N/A 06/30/2016   Procedure: COLONOSCOPY WITH PROPOFOL;  Surgeon: Lucilla Lame, MD;  Location: [facility];  Service: Endoscopy;  Laterality: N/A;  . ELECTROPHYSIOLOGIC STUDY N/A 01/04/2016   Procedure: Cardioversion;  Surgeon: Wellington Hampshire, MD;  Location: [facility];  Service: Cardiovascular;  Laterality: N/A;  .  ELECTROPHYSIOLOGIC STUDY N/A 02/17/2016   Procedure: A-Flutter Ablation;  Surgeon: Deboraha Sprang, MD;  Location: [facility];  Service: Cardiovascular;  Laterality: N/A;  . GANGLION CYST EXCISION Right 1994   wrist & back  . HERNIA REPAIR Right 1994   abdominal repair with mesh  . NASAL SEPTUM SURGERY  2011   Dr. [name]     Family History  Problem Relation Age of Onset  . Hyperlipidemia Mother   . Cancer Mother        skin cancer?  . Heart  disease Mother 7       MI - died in her sleep  . Hyperlipidemia Father   . Heart disease Father   . Kidney disease Neg Hx   . Prostate cancer Neg Hx   . Kidney cancer Neg Hx   . Bladder Cancer Neg Hx     SOCIAL HX: Former smoker   Current Outpatient Medications:  .  albuterol (VENTOLIN HFA) 108 (90 Base) MCG/ACT inhaler, Inhale 1-2 puffs into the lungs every 4 (four) hours as needed for wheezing or shortness of breath., Disp: 1 Inhaler, Rfl: 0 .  ALPRAZolam (XANAX) 0.5 MG tablet, TAKE 1 TABLET BY MOUTH EVERY NIGHT AT BEDTIME AS NEEDED FOR ANXIETY, Disp: 20 tablet, Rfl: 1 .  atorvastatin (LIPITOR) 10 MG tablet, TAKE 1 TABLET BY MOUTH IN  THE MORNING, Disp: 90 tablet, Rfl: 3 .  cyclobenzaprine (FLEXERIL) 10 MG tablet, TAKE 1 TABLET(10 MG) BY MOUTH THREE TIMES DAILY AS NEEDED FOR MUSCLE SPASMS, Disp: 90 tablet, Rfl: 0 .  escitalopram (LEXAPRO) 10 MG tablet, TAKE 1 TABLET(10 MG) BY MOUTH DAILY, Disp: 90 tablet, Rfl: 1 .  finasteride (PROSCAR) 5 MG tablet, Take 1 tablet (5 mg total) by mouth daily., Disp: 90 tablet, Rfl: 3 .  gabapentin (NEURONTIN) 300 MG capsule, , Disp: , Rfl:  .  loratadine (CLARITIN) 10 MG tablet, Take 10 mg by mouth daily as needed for allergies., Disp: , Rfl:  .  losartan (COZAAR) 50 MG tablet, TAKE 1 TABLET(50 MG) BY MOUTH DAILY, Disp: 90 tablet, Rfl: 1 .  meloxicam (MOBIC) 15 MG tablet, TAKE 1 TABLET(15 MG) BY MOUTH DAILY AS NEEDED FOR PAIN, Disp: 90 tablet, Rfl: 0 .  nystatin (MYCOSTATIN) 100000 UNIT/ML suspension, Take 5 mLs (500,000 Units total) by mouth 4 (four) times daily. Rinse hold and spit, Disp: 473 mL, Rfl: 0 .  pantoprazole (PROTONIX) 40 MG tablet, TAKE 1 TABLET(40 MG) BY MOUTH DAILY, Disp: 90 tablet, Rfl: 1 .  tadalafil (CIALIS) 5 MG tablet, TAKE 1 TABLET(5 MG) BY MOUTH DAILY, Disp: 90 tablet, Rfl: 3 .  triamcinolone (NASACORT) 55 MCG/ACT AERO nasal inhaler, Place 2 sprays into the nose daily as needed (allergies). , Disp: , Rfl:  .  vitamin C (ASCORBIC  ACID) 500 MG tablet, Take 1 tablet (500 mg total) by mouth daily. Chewable, Disp: 90 tablet, Rfl: 0  EXAM: This is a telehealth telephone visit and thus no physical exam was completed.  ASSESSMENT AND PLAN:  Discussed the following assessment and plan:  Sleep apnea Refer to pulmonology.  Discussed that we needed to try to get him treated for this as there can be stress on the heart and lungs with untreated sleep apnea.  This could also be contributing to his anxiety and depression issues.  Adjustment disorder with mixed anxiety and depressed mood Relatively stable.  He will continue his current treatment.  Excessive gas He will see GI as planned.  Hyperlipidemia Continue Lipitor.  He will come in for labs.    I discussed the assessment and treatment plan with the patient. The patient was provided an opportunity to ask questions and all were answered. The patient agreed with the plan and demonstrated an understanding of the instructions.   The patient was advised to call back or seek an in-person evaluation if the symptoms worsen or if the condition fails to improve as anticipated.  I provided 18 minutes of non-face-to-face time during this encounter.   [name], MD

## 2019-01-15 NOTE — Assessment & Plan Note (Signed)
He will see GI as planned. 

## 2019-01-15 NOTE — Assessment & Plan Note (Signed)
Relatively stable.  He will continue his current treatment.

## 2019-01-15 NOTE — Assessment & Plan Note (Signed)
Refer to pulmonology.  Discussed that we needed to try to get him treated for this as there can be stress on the heart and lungs with untreated sleep apnea.  This could also be contributing to his anxiety and depression issues.

## 2019-01-15 NOTE — Assessment & Plan Note (Signed)
Continue Lipitor.  He will come in for labs.

## 2019-01-18 ENCOUNTER — Other Ambulatory Visit (INDEPENDENT_AMBULATORY_CARE_PROVIDER_SITE_OTHER): Payer: Medicare Other

## 2019-01-18 ENCOUNTER — Other Ambulatory Visit: Payer: Self-pay

## 2019-01-18 DIAGNOSIS — R7309 Other abnormal glucose: Secondary | ICD-10-CM

## 2019-01-18 DIAGNOSIS — E785 Hyperlipidemia, unspecified: Secondary | ICD-10-CM | POA: Diagnosis not present

## 2019-01-18 LAB — LIPID PANEL
Cholesterol: 184 mg/dL (ref 0–200)
HDL: 51.6 mg/dL (ref >39.00)
LDL Cholesterol: 116 mg/dL — ABNORMAL HIGH (ref 0–99)
NonHDL: 132.28
Total CHOL/HDL Ratio: 4
Triglycerides: 83 mg/dL (ref 0.0–149.0)
VLDL: 16.6 mg/dL (ref 0.0–40.0)

## 2019-01-18 LAB — HEMOGLOBIN A1C: Hgb A1c MFr Bld: 5.6 % (ref 4.6–6.5)

## 2019-01-18 LAB — COMPREHENSIVE METABOLIC PANEL
ALT: 20 U/L (ref 0–53)
AST: 19 U/L (ref 0–37)
Albumin: 4.3 g/dL (ref 3.5–5.2)
Alkaline Phosphatase: 55 U/L (ref 39–117)
BUN: 17 mg/dL (ref 6–23)
CO2: 31 mEq/L (ref 19–32)
Calcium: 9.7 mg/dL (ref 8.4–10.5)
Chloride: 102 mEq/L (ref 96–112)
Creatinine, Ser: 0.94 mg/dL (ref 0.40–1.50)
GFR: 81.13 mL/min (ref >60.00)
Glucose, Bld: 97 mg/dL (ref 70–99)
Potassium: 4.4 mEq/L (ref 3.5–5.1)
Sodium: 140 mEq/L (ref 135–145)
Total Bilirubin: 1.6 mg/dL — ABNORMAL HIGH (ref 0.2–1.2)
Total Protein: 6.8 g/dL (ref 6.0–8.3)

## 2019-01-20 ENCOUNTER — Other Ambulatory Visit: Payer: Self-pay | Admitting: Family Medicine

## 2019-01-29 ENCOUNTER — Encounter: Payer: Self-pay | Admitting: Adult Health

## 2019-01-29 ENCOUNTER — Ambulatory Visit (INDEPENDENT_AMBULATORY_CARE_PROVIDER_SITE_OTHER): Payer: Medicare Other | Admitting: Adult Health

## 2019-01-29 ENCOUNTER — Other Ambulatory Visit: Payer: Self-pay

## 2019-01-29 VITALS — BP 134/78 | HR 68 | Temp 97.4°F | Ht 70.0 in | Wt 206.2 lb

## 2019-01-29 DIAGNOSIS — G4733 Obstructive sleep apnea (adult) (pediatric): Secondary | ICD-10-CM | POA: Diagnosis not present

## 2019-01-29 NOTE — Assessment & Plan Note (Signed)
Severe OSA with significant daytime symptoms.  Hx of A Fib , chronic pain  Declines [facility]SPIRE referral at this time  Will try CPAP trial , if not able to tolerate will send for CPAP titration  Try dream wear nasal, may be best given his claustrophobia   Plan  Patient Instructions  Begin CPAP At bedtime  .  Goal is to wear for at least 4hr each night . Try to wear all night long , the longer the better.  Work on healthy weight .  Order for Dream wear nasal mask .  Do not drive if sleepy.  Saline nasal spray Three times a day  .  Saline nasal gel At bedtime   Follow up in 2-3 months and As needed

## 2019-01-29 NOTE — Progress Notes (Signed)
@Patient  ID: [name]., male    DOB: 1955/10/14, 63 y.o.   MRN: [id number]  Chief Complaint  Patient presents with  . Follow-up    OSA     Referring provider: [name], MD  HPI: 63 year old male seen for pulmonary consult September 2019 for sleep apnea and cough Medical history significant for A. Fib previous ablation   TEST/EVENTS :  --Sleep study 01/05/16, AHI equals 60.  01/29/2019 Follow up : OSA  Patient presents for a follow-up.  Patient was seen September 2019 for sleep consult.  Patient was diagnosed with severe sleep apnea in 2017.  Sleep study showed AHI at 60/hour.  Patient declines CPAP.  At last visit patient was recommended for CPAP but declined.  He was referred to ENT for oral appliance.  Patient says he has not been wearing his oral appliance. Says it was uncomfortable caused severe dry mouth and jaw pain.  He continues to have daytime sleepiness.  Patient was recently seen by primary care provided and referred back to our office for further evaluation.. Complains of daytime sleepiness, fatigue , falls asleep sitting still, naps during daytime , restless sleep.  On disability due to chronic pain . On Flexeril and mobic .  Takes xanax on occasion.  Has post nasal drip and dry mouth . Has dry cough .  Very claustrophobic. Has tried CPAP during sleep study, say he can not do a full face mask.  Says he is tired of feeling low energy and washed out all the time.  Discussed [facility]SP[employer] device , declines at this time. Wants to try CPAP .   Allergies  Allergen Reactions  . Other Other (See Comments)    Pollen:  Sinus infection, cough, fatigue     Immunization History  Administered Date(s) Administered  . Influenza Whole 11/18/2010  . Influenza,inj,Quad PF,6+ Mos 10/17/2012, 11/02/2013, 10/16/2014, 10/12/2016, 10/17/2017, 10/17/2018  . Influenza-Unspecified 11/02/2011, 10/17/2012, 11/02/2013, 10/22/2015  . Tdap 10/17/2012    Past Medical History:    Diagnosis Date  . Anxiety   . Arthritis   . BPH (benign prostatic hyperplasia) 11/19/2013  . 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 Bulging disc    C2/3, C3/4, C6/7  . Cervical disc herniation    C4/5 and C5/6  . Colon polyp 04/18/2011  . Constipation 08/31/2012  . Depression   . Diverticulosis   . 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 GERD (gastroesophageal reflux disease)   . H/O diverticulitis of colon   . Hemorrhoid   . Hiatal hernia   . Hyperlipidemia   . Levoscoliosis   . Sleep difficulties   . Spinal stenosis in cervical region    cord abutment C4/5  . Testicular pain, left   . Typical atrial flutter ([place]) 11/2015   a. CHADS2VASc => 0; b. on Eliquis for pending ablation     Tobacco History: Social History   Tobacco Use  Smoking Status Former Smoker  . Packs/day: 1.00  . Years: 20.00  . Pack years: 20.00  . Types: Cigarettes  . Quit date: 02/08/1991  . Years since quitting: 27.9  Smokeless Tobacco Never Used   Counseling given: Not Answered   Outpatient Medications Prior to Visit  Medication Sig Dispense Refill  . ALPRAZolam (XANAX) 0.5 MG tablet TAKE 1 TABLET BY MOUTH EVERY NIGHT AT BEDTIME AS NEEDED FOR ANXIETY 20 tablet 1  . atorvastatin (LIPITOR) 10 MG tablet TAKE 1 TABLET  BY MOUTH IN  THE MORNING 90 tablet 3  . cyclobenzaprine (FLEXERIL) 10 MG tablet TAKE 1 TABLET(10 MG) BY MOUTH THREE TIMES DAILY AS NEEDED FOR MUSCLE SPASMS 90 tablet 0  . escitalopram (LEXAPRO) 10 MG tablet TAKE 1 TABLET(10 MG) BY MOUTH DAILY 90 tablet 1  . finasteride (PROSCAR) 5 MG tablet Take 1 tablet (5 mg total) by mouth daily. 90 tablet 3  . gabapentin (NEURONTIN) 300 MG capsule     . loratadine (CLARITIN) 10 MG tablet Take 10 mg by mouth daily as needed for allergies.    [name] losartan (COZAAR) 50 MG tablet TAKE 1 TABLET(50 MG) BY MOUTH DAILY 90 tablet 1  . meloxicam (MOBIC) 15 MG tablet TAKE 1 TABLET(15 MG) BY MOUTH DAILY AS NEEDED FOR  PAIN 90 tablet 0  . pantoprazole (PROTONIX) 40 MG tablet TAKE 1 TABLET(40 MG) BY MOUTH DAILY 90 tablet 1  . tadalafil (CIALIS) 5 MG tablet TAKE 1 TABLET(5 MG) BY MOUTH DAILY 90 tablet 3  . triamcinolone (NASACORT) 55 MCG/ACT AERO nasal inhaler Place 2 sprays into the nose daily as needed (allergies).     . vitamin C (ASCORBIC ACID) 500 MG tablet Take 1 tablet (500 mg total) by mouth daily. Chewable 90 tablet 0  . nystatin (MYCOSTATIN) 100000 UNIT/ML suspension Take 5 mLs (500,000 Units total) by mouth 4 (four) times daily. Rinse hold and spit 473 mL 0  . albuterol (VENTOLIN HFA) 108 (90 Base) MCG/ACT inhaler Inhale 1-2 puffs into the lungs every 4 (four) hours as needed for wheezing or shortness of breath. (Patient not taking: Reported on 01/29/2019) 1 Inhaler 0   No facility-administered medications prior to visit.     Review of Systems:   Constitutional:   No  weight loss, night sweats,  Fevers, chills, + fatigue, or  lassitude.  HEENT:   No headaches,  Difficulty swallowing,  Tooth/dental problems, or  Sore throat,                No sneezing, itching, ear ache, nasal congestion, post nasal drip,   CV:  No chest pain,  Orthopnea, PND, swelling in lower extremities, anasarca, dizziness, palpitations, syncope.   GI  No heartburn, indigestion, abdominal pain, nausea, vomiting, diarrhea, change in bowel habits, loss of appetite, bloody stools.   Resp: No shortness of breath with exertion or at rest.  No excess mucus, no productive cough,  No non-productive cough,  No coughing up of blood.  No change in color of mucus.  No wheezing.  No chest wall deformity  Skin: no rash or lesions.  GU: no dysuria, change in color of urine, no urgency or frequency.  No flank pain, no hematuria   MS:  Chronic back pain     Physical Exam  BP 134/78 (BP Location: Left Arm, Cuff Size: Normal)   Pulse 68   Temp (!) 97.4 F (36.3 C) (Temporal)   Ht 5\' 10"  (1.778 m)   Wt 206 lb 3.2 oz (93.5 kg)    SpO2 100%   BMI 29.59 kg/m   GEN: A/Ox3; pleasant , NAD, well nourished    HEENT:  [place]/AT,   NOSE-clear, THROAT-clear, no lesions, no postnasal drip or exudate noted. Class 2 MP airway   NECK:  Supple w/ fair ROM; no JVD; normal carotid impulses w/o bruits; no thyromegaly or nodules palpated; no lymphadenopathy.    RESP  Clear  P & A; w/o, wheezes/ rales/ or rhonchi. no accessory muscle use, no dullness to percussion  CARD:  RRR, no m/r/g, no peripheral edema, pulses intact, no cyanosis or clubbing.  GI:   Soft & nt; nml bowel sounds; no organomegaly or masses detected.   Musco: Warm bil, no deformities or joint swelling noted.   Neuro: alert, no focal deficits noted.    Skin: Warm, no lesions or rashes    Lab Results:  CBC  BNP No results found for: BNP  ProBNP No results found for: PROBNP  Imaging: No results found.    No flowsheet data found.  No results found for: NITRICOXIDE      Assessment & Plan:   Sleep apnea Severe OSA with significant daytime symptoms.  Hx of A Fib , chronic pain  Declines [facility] referral at this time  Will try CPAP trial , if not able to tolerate will send for CPAP titration  Try dream wear nasal, may be best given his claustrophobia   Plan  Patient Instructions  Begin CPAP At bedtime  .  Goal is to wear for at least 4hr each night . Try to wear all night long , the longer the better.  Work on healthy weight .  Order for Dream wear nasal mask .  Do not drive if sleepy.  Saline nasal spray Three times a day  .  Saline nasal gel At bedtime   Follow up in 2-3 months and As needed           Rexene Edison, NP 01/29/2019

## 2019-01-29 NOTE — Patient Instructions (Signed)
Begin CPAP At bedtime  .  Goal is to wear for at least 4hr each night . Try to wear all night long , the longer the better.  Work on healthy weight .  Order for Dream wear nasal mask .  Do not drive if sleepy.  Saline nasal spray Three times a day  .  Saline nasal gel At bedtime   Follow up in 2-3 months and As needed

## 2019-02-12 ENCOUNTER — Telehealth: Payer: Self-pay | Admitting: Family Medicine

## 2019-02-12 DIAGNOSIS — E782 Mixed hyperlipidemia: Secondary | ICD-10-CM

## 2019-02-12 MED ORDER — ATORVASTATIN CALCIUM 20 MG PO TABS: ORAL_TABLET | ORAL | 1 refills | Status: DC

## 2019-02-12 NOTE — Telephone Encounter (Signed)
Pt wants to know if atorvastatin (LIPITOR) 10 MG tablet needs to be moved up to 20mg  per a discussion after his lab work. Please advise. Umi Mainor,cma

## 2019-02-12 NOTE — Telephone Encounter (Signed)
It does need to be increased to 20 mg.  This is in a result note.  I sent the new dose in.  He needs lab work completed in 6 weeks.  Orders placed.

## 2019-02-12 NOTE — Telephone Encounter (Signed)
Pt wants to know if atorvastatin (LIPITOR) 10 MG tablet needs to be moved up to 20mg  per a discussion after his lab work. Please advise.

## 2019-02-12 NOTE — Telephone Encounter (Signed)
Spoke with patient and gave lab results and increase to medicine.  Fleming Prill,cma

## 2019-02-16 ENCOUNTER — Other Ambulatory Visit: Payer: Self-pay | Admitting: Family Medicine

## 2019-02-16 DIAGNOSIS — E782 Mixed hyperlipidemia: Secondary | ICD-10-CM

## 2019-02-16 MED ORDER — ATORVASTATIN CALCIUM 20 MG PO TABS: ORAL_TABLET | ORAL | 1 refills | Status: DC

## 2019-02-28 ENCOUNTER — Encounter: Payer: Self-pay | Admitting: Gastroenterology

## 2019-02-28 ENCOUNTER — Ambulatory Visit: Payer: Medicare Other | Admitting: Gastroenterology

## 2019-02-28 ENCOUNTER — Ambulatory Visit (INDEPENDENT_AMBULATORY_CARE_PROVIDER_SITE_OTHER): Payer: Medicare Other | Admitting: Gastroenterology

## 2019-02-28 ENCOUNTER — Other Ambulatory Visit: Payer: Self-pay

## 2019-02-28 VITALS — BP 149/75 | HR 79 | Temp 98.4°F | Ht 70.0 in | Wt 208.8 lb

## 2019-02-28 DIAGNOSIS — R140 Abdominal distension (gaseous): Secondary | ICD-10-CM

## 2019-02-28 DIAGNOSIS — R142 Eructation: Secondary | ICD-10-CM

## 2019-02-28 NOTE — Progress Notes (Signed)
Primary Care Physician: [name], MD  Primary Gastroenterologist:  Dr. Lucilla Lame  Chief Complaint  Patient presents with  . burping    HPI: Tyler Marsh. is a 64 y.o. male here for burping.  The patient had a colonoscopy by me back in 2018 and was recommended to have a repeat colonoscopy in 10 years.  The patient was referred by his physician for excessive burping.  There is a report in the patient's chart that the patient was started on CPAP back in December.  The patient had been tried on oral CPAP but reported that it was hurting his jaw.  The patient was tried on Trulance and reported to his PCP that it made him have more gas and was being helped by Senokot and MiraLAX. The patient is coming back today because he cannot pinpoint when he is swallowing the air.  He reports that he was unable to tolerate CPAP and is now using nothing.  His wife does report that he snores a lot.  There is no report of any unexplained weight loss fevers chills nausea or vomiting.  Most of his excessive gas is from burping although he does stay constipated all the time.  Current Outpatient Medications  Medication Sig Dispense Refill  . ALPRAZolam (XANAX) 0.5 MG tablet TAKE 1 TABLET BY MOUTH EVERY NIGHT AT BEDTIME AS NEEDED FOR ANXIETY 20 tablet 1  . atorvastatin (LIPITOR) 20 MG tablet TAKE 1 TABLET BY MOUTH IN  THE MORNING 90 tablet 1  . cyclobenzaprine (FLEXERIL) 10 MG tablet TAKE 1 TABLET(10 MG) BY MOUTH THREE TIMES DAILY AS NEEDED FOR MUSCLE SPASMS 90 tablet 0  . escitalopram (LEXAPRO) 10 MG tablet TAKE 1 TABLET(10 MG) BY MOUTH DAILY 90 tablet 1  . finasteride (PROSCAR) 5 MG tablet Take 1 tablet (5 mg total) by mouth daily. 90 tablet 3  . loratadine (CLARITIN) 10 MG tablet Take 10 mg by mouth daily as needed for allergies.    [name] losartan (COZAAR) 50 MG tablet TAKE 1 TABLET(50 MG) BY MOUTH DAILY 90 tablet 1  . meloxicam (MOBIC) 15 MG tablet TAKE 1 TABLET(15 MG) BY MOUTH DAILY AS NEEDED FOR  PAIN 90 tablet 0  . pantoprazole (PROTONIX) 40 MG tablet TAKE 1 TABLET(40 MG) BY MOUTH DAILY 90 tablet 1  . tadalafil (CIALIS) 5 MG tablet TAKE 1 TABLET(5 MG) BY MOUTH DAILY 90 tablet 3  . triamcinolone (NASACORT) 55 MCG/ACT AERO nasal inhaler Place 2 sprays into the nose daily as needed (allergies).     [name] albuterol (VENTOLIN HFA) 108 (90 Base) MCG/ACT inhaler Inhale 1-2 puffs into the lungs every 4 (four) hours as needed for wheezing or shortness of breath. (Patient not taking: Reported on 01/29/2019) 1 Inhaler 0  . gabapentin (NEURONTIN) 300 MG capsule      No current facility-administered medications for this visit.    Allergies as of 02/28/2019 - Review Complete 02/28/2019  Allergen Reaction Noted  . Other Other (See Comments) 06/16/2011    ROS:  General: Negative for anorexia, weight loss, fever, chills, fatigue, weakness. ENT: Negative for hoarseness, difficulty swallowing , nasal congestion. CV: Negative for chest pain, angina, palpitations, dyspnea on exertion, peripheral edema.  Respiratory: Negative for dyspnea at rest, dyspnea on exertion, cough, sputum, wheezing.  GI: See history of present illness. GU:  Negative for dysuria, hematuria, urinary incontinence, urinary frequency, nocturnal urination.  Endo: Negative for unusual weight change.    Physical Examination:   BP (!) 149/75  Pulse 79   Temp 98.4 F (36.9 C) (Oral)   Ht 5\' 10"  (1.778 m)   Wt 208 lb 12.8 oz (94.7 kg)   BMI 29.96 kg/m   General: Well-nourished, well-developed in no acute distress.  Eyes: No icterus. Conjunctivae pink. Extremities: No lower extremity edema. No clubbing or deformities. Neuro: Alert and oriented x 3.  Grossly intact. Skin: Warm and dry, no jaundice.   Psych: Alert and cooperative, normal mood and affect.  Labs:    Imaging Studies: No results found.  Assessment and Plan:   Tyler Moccia. is a 64 y.o. y/o male who comes in today with a history of excessive burping.  The  patient has again been told that he is likely swallowing air from some unknown cause.  He denies drinking with a straw, chewing gum, drinking carbonated drinks or eating fast.  He does drink iced tea that has not carbonated before he goes to sleep at night.  He is likely having aerophagia from his sleep apnea and may be due to his history of anxiety which she has been told is not uncommon.  The patient will continue to try and move his bowels on a regular basis so he does not get constipated because he feels that that is contributing to his abdominal pressure and gas.  The patient will have his blood sent off for H. pylori.  The patient has been explained the plan and agrees with it.     Lucilla Lame, MD. Marval Regal    Note: This dictation was prepared with Dragon dictation along with smaller phrase technology. Any transcriptional errors that result from this process are unintentional.

## 2019-03-01 ENCOUNTER — Other Ambulatory Visit: Payer: Self-pay

## 2019-03-01 DIAGNOSIS — R140 Abdominal distension (gaseous): Secondary | ICD-10-CM

## 2019-03-01 DIAGNOSIS — R142 Eructation: Secondary | ICD-10-CM

## 2019-03-05 ENCOUNTER — Other Ambulatory Visit: Payer: Self-pay | Admitting: Family Medicine

## 2019-03-11 ENCOUNTER — Other Ambulatory Visit: Payer: Self-pay

## 2019-03-13 ENCOUNTER — Other Ambulatory Visit (INDEPENDENT_AMBULATORY_CARE_PROVIDER_SITE_OTHER): Payer: Medicare Other

## 2019-03-13 ENCOUNTER — Other Ambulatory Visit: Payer: Self-pay

## 2019-03-13 DIAGNOSIS — E782 Mixed hyperlipidemia: Secondary | ICD-10-CM

## 2019-03-13 LAB — LIPID PANEL
Cholesterol: 175 mg/dL (ref 0–200)
HDL: 50.5 mg/dL (ref >39.00)
LDL Cholesterol: 103 mg/dL — ABNORMAL HIGH (ref 0–99)
NonHDL: 124.88
Total CHOL/HDL Ratio: 3
Triglycerides: 111 mg/dL (ref 0.0–149.0)
VLDL: 22.2 mg/dL (ref 0.0–40.0)

## 2019-03-13 LAB — HEPATIC FUNCTION PANEL
ALT: 18 U/L (ref 0–53)
AST: 19 U/L (ref 0–37)
Albumin: 4.1 g/dL (ref 3.5–5.2)
Alkaline Phosphatase: 59 U/L (ref 39–117)
Bilirubin, Direct: 0.2 mg/dL (ref 0.0–0.3)
Total Bilirubin: 1.3 mg/dL — ABNORMAL HIGH (ref 0.2–1.2)
Total Protein: 6.8 g/dL (ref 6.0–8.3)

## 2019-03-13 LAB — H. PYLORI ANTIBODY, IGG: H. pylori, IgG AbS: >9.4 Index Value — ABNORMAL HIGH (ref 0.00–0.79)

## 2019-03-14 ENCOUNTER — Telehealth: Payer: Self-pay | Admitting: Gastroenterology

## 2019-03-14 ENCOUNTER — Other Ambulatory Visit: Payer: Self-pay

## 2019-03-14 MED ORDER — CLARITHROMYCIN 500 MG PO TABS: 500.0000 mg | ORAL_TABLET | Freq: Two times a day (BID) | ORAL | 0 refills | Status: DC

## 2019-03-14 MED ORDER — AMOXICILLIN 500 MG PO CAPS: 1000.0000 mg | ORAL_CAPSULE | Freq: Two times a day (BID) | ORAL | 0 refills | Status: DC

## 2019-03-14 NOTE — Telephone Encounter (Signed)
Pt notified of results and rx sent to pt's pharmacy.

## 2019-03-14 NOTE — Telephone Encounter (Signed)
-----   Message from Lucilla Lame, MD sent at 03/13/2019  1:57 PM EST ----- Please let the patient know that his H. pylori came back positive and he should be treated for it.  We can treat him with Pylera if he has no allergies.

## 2019-03-14 NOTE — Telephone Encounter (Signed)
Pt is calling he received Mychart results for labs and would like to discuss them please call pt

## 2019-03-22 ENCOUNTER — Ambulatory Visit (INDEPENDENT_AMBULATORY_CARE_PROVIDER_SITE_OTHER): Payer: Medicare Other | Admitting: Internal Medicine

## 2019-03-22 ENCOUNTER — Encounter: Payer: Self-pay | Admitting: Internal Medicine

## 2019-03-22 DIAGNOSIS — G4733 Obstructive sleep apnea (adult) (pediatric): Secondary | ICD-10-CM

## 2019-03-22 NOTE — Addendum Note (Signed)
Addended by: Maryanna Shape A on: 03/22/2019 02:30 PM   Modules accepted: Orders

## 2019-03-22 NOTE — Progress Notes (Signed)
I connected with the patient by telephone enabled telemedicine visit and verified that I am speaking with the correct person using two identifiers.    I discussed the limitations, risks, security and privacy concerns of performing an evaluation and management service by telemedicine and the availability of in-person appointments. I also discussed with the patient that there may be a patient responsible charge related to this service. The patient expressed understanding and agreed to proceed.  PATIENT AGREES AND CONFIRMS -YES   Other persons participating in the visit and their role in the encounter: Patient, nursing  This visit type was conducted due to national recommendations for restrictions regarding the COVID-19 Pandemic (e.g. social distancing).  This format is felt to be most appropriate for this patient at this time.  All issues noted in this document were discussed and addressed.       Referring provider: [name], MD      SYNOPSIS 64 year old male seen for pulmonary consult September 2019 for sleep apnea and cough Medical history significant for A. Fib previous ablation   TEST/EVENTS :  --Sleep study 01/05/16, AHI equals 60.  CC  follow up severe OSA   HPI 03/22/2019 severe OSA based on Sleep study AHI 60 September 2019 for sleep consult Patient was diagnosed with severe sleep apnea in 2017.  Sleep study showed AHI at 60/hour -Patient declines CPAP.  At last visit patient was recommended for CPAP but declined.  He was referred to ENT for oral appliance.  Patient says he has not been wearing his oral appliance -Says it was uncomfortable caused severe dry mouth and jaw pain.  He continues to have daytime sleepiness.    Complains of daytime sleepiness, fatigue , falls asleep sitting still, naps during daytime , restless sleep.  On disability due to chronic pain . On Flexeril and mobic .  Takes xanax on occasion.   Very claustrophobic. Has tried CPAP again  and had very difficult time  Patient is asking about INSPIRE electrical stim device I have stated that we will need to provide more info to patient  Allergies  Allergen Reactions  . Other Other (See Comments)    Pollen:  Sinus infection, cough, fatigue     Immunization History  Administered Date(s) Administered  . Influenza Whole 11/18/2010  . Influenza,inj,Quad PF,6+ Mos 10/17/2012, 11/02/2013, 10/16/2014, 10/12/2016, 10/17/2017, 10/17/2018  . Influenza-Unspecified 11/02/2011, 10/17/2012, 11/02/2013, 10/22/2015  . Tdap 10/17/2012    Past Medical History:  Diagnosis Date  . Anxiety   . Arthritis   . BPH (benign prostatic hyperplasia) 11/19/2013  . 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 Bulging disc    C2/3, C3/4, C6/7  . Cervical disc herniation    C4/5 and C5/6  . Colon polyp 04/18/2011  . Constipation 08/31/2012  . Depression   . Diverticulosis   . 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 GERD (gastroesophageal reflux disease)   . H/O diverticulitis of colon   . Hemorrhoid   . Hiatal hernia   . Hyperlipidemia   . Levoscoliosis   . Sleep difficulties   . Spinal stenosis in cervical region    cord abutment C4/5  . Testicular pain, left   . Typical atrial flutter ([place]) 11/2015   a. CHADS2VASc => 0; b. on Eliquis for pending ablation     Tobacco History: Social History   Tobacco Use  Smoking Status Former Smoker  . Packs/day: 1.00  . Years:  20.00  . Pack years: 20.00  . Types: Cigarettes  . Quit date: 02/08/1991  . Years since quitting: 28.1  Smokeless Tobacco Never Used   Counseling given: Not Answered   Outpatient Medications Prior to Visit  Medication Sig Dispense Refill  . albuterol (VENTOLIN HFA) 108 (90 Base) MCG/ACT inhaler Inhale 1-2 puffs into the lungs every 4 (four) hours as needed for wheezing or shortness of breath. (Patient not taking: Reported on 01/29/2019) 1 Inhaler 0  . ALPRAZolam (XANAX) 0.5 MG  tablet TAKE 1 TABLET BY MOUTH EVERY NIGHT AT BEDTIME AS NEEDED FOR ANXIETY 20 tablet 1  . amoxicillin (AMOXIL) 500 MG capsule Take 2 capsules (1,000 mg total) by mouth 2 (two) times daily. 56 capsule 0  . atorvastatin (LIPITOR) 20 MG tablet TAKE 1 TABLET BY MOUTH IN  THE MORNING 90 tablet 1  . clarithromycin (BIAXIN) 500 MG tablet Take 1 tablet (500 mg total) by mouth 2 (two) times daily. 28 tablet 0  . cyclobenzaprine (FLEXERIL) 10 MG tablet TAKE 1 TABLET(10 MG) BY MOUTH THREE TIMES DAILY AS NEEDED FOR MUSCLE SPASMS 90 tablet 0  . escitalopram (LEXAPRO) 10 MG tablet TAKE 1 TABLET(10 MG) BY MOUTH DAILY 90 tablet 1  . finasteride (PROSCAR) 5 MG tablet Take 1 tablet (5 mg total) by mouth daily. 90 tablet 3  . gabapentin (NEURONTIN) 300 MG capsule     . loratadine (CLARITIN) 10 MG tablet Take 10 mg by mouth daily as needed for allergies.    [name] losartan (COZAAR) 50 MG tablet TAKE 1 TABLET(50 MG) BY MOUTH DAILY 90 tablet 1  . meloxicam (MOBIC) 15 MG tablet TAKE 1 TABLET BY MOUTH  DAILY AS NEEDED FOR PAIN 90 tablet 3  . pantoprazole (PROTONIX) 40 MG tablet TAKE 1 TABLET(40 MG) BY MOUTH DAILY 90 tablet 1  . tadalafil (CIALIS) 5 MG tablet TAKE 1 TABLET(5 MG) BY MOUTH DAILY 90 tablet 3  . triamcinolone (NASACORT) 55 MCG/ACT AERO nasal inhaler Place 2 sprays into the nose daily as needed (allergies).      No facility-administered medications prior to visit.      Review of Systems:  Gen:  Denies  fever, sweats, chills weight loss +fatigue HEENT: Denies blurred vision, double vision, ear pain, eye pain, hearing loss, nose bleeds, sore throat Cardiac:  No dizziness, chest pain or heaviness, chest tightness,edema, No JVD Resp:   No cough, -sputum production, -shortness of breath,-wheezing, -hemoptysis,  Gi: Denies swallowing difficulty, stomach pain, nausea or vomiting, diarrhea, constipation, bowel incontinence Gu:  Denies bladder incontinence, burning urine Ext:   Denies Joint pain, stiffness or  swelling Skin: Denies  skin rash, easy bruising or bleeding or hives Endoc:  Denies polyuria, polydipsia , polyphagia or weight change Psych:   Denies depression, insomnia or hallucinations  Other:  All other systems negative     Severe OSA patient has tried and failed CPAP and oral device therapy multiple times Patient would like to look into INSPIRE device for further therapy   Obesity -recommend significant weight loss -recommend changing diet  Deconditioned state -Recommend increased daily activity and exercise    COVID-19 EDUCATION: The signs and symptoms of COVID-19 were discussed with the patient and how to seek care for testing.  The importance of social distancing was discussed today. Hand Washing Techniques and avoid touching face was advised.     MEDICATION ADJUSTMENTS/LABS AND TESTS ORDERED: Assess for INSPIRE DEVICE   CURRENT MEDICATIONS REVIEWED AT LENGTH WITH PATIENT TODAY   Patient satisfied  with Plan of action and management. All questions answered  Follow up as needed  TOTAL TIME SPENT 12 mins  Maretta Bees Patricia Pesa, M.D.  [facility]  Medical Director [place] Director [facility]

## 2019-03-22 NOTE — Patient Instructions (Signed)
MEDICATION ADJUSTMENTS/LABS AND TESTS ORDERED: Assess for INSPIRE DEVICE

## 2019-03-24 ENCOUNTER — Other Ambulatory Visit: Payer: Self-pay | Admitting: Family Medicine

## 2019-03-24 DIAGNOSIS — E785 Hyperlipidemia, unspecified: Secondary | ICD-10-CM

## 2019-03-26 ENCOUNTER — Telehealth: Payer: Self-pay | Admitting: Family Medicine

## 2019-03-26 DIAGNOSIS — K219 Gastro-esophageal reflux disease without esophagitis: Secondary | ICD-10-CM

## 2019-03-26 DIAGNOSIS — M5442 Lumbago with sciatica, left side: Secondary | ICD-10-CM

## 2019-03-26 DIAGNOSIS — G8929 Other chronic pain: Secondary | ICD-10-CM

## 2019-03-26 MED ORDER — CYCLOBENZAPRINE HCL 10 MG PO TABS: ORAL_TABLET | ORAL | 0 refills | Status: DC

## 2019-03-26 MED ORDER — PANTOPRAZOLE SODIUM 40 MG PO TBEC: DELAYED_RELEASE_TABLET | ORAL | 1 refills | Status: DC

## 2019-03-26 NOTE — Telephone Encounter (Signed)
Patient called and wanted office to know that his mail order pharmacy, just put in a order for the following medications; pantoprazole (PROTONIX) 40 MG tablet and cyclobenzaprine (FLEXERIL) 10 MG tablet. Patient wanted office to be looking for orders. Thanks

## 2019-03-26 NOTE — Telephone Encounter (Signed)
Patient called and needed a new RX for protonix and flexeril [facility], this was done.  Jendaya Gossett,cma

## 2019-03-27 ENCOUNTER — Telehealth: Payer: Self-pay | Admitting: Gastroenterology

## 2019-03-27 NOTE — Telephone Encounter (Signed)
Patient called & states he is completing his antibiotics in the next 2-3 day for Hpylori infection. Does he need to be retested to see if he still has the infection?

## 2019-03-27 NOTE — Telephone Encounter (Signed)
Called pt and advised him, we will recheck h pylori in 6 weeks after he completed his treatment.

## 2019-03-27 NOTE — Telephone Encounter (Signed)
atient called & l/m on v/m asking for a return call. I returned his call on 03-27-2019 @ 9:25am and l/m on his v/m to return our call.

## 2019-04-10 DIAGNOSIS — G4733 Obstructive sleep apnea (adult) (pediatric): Secondary | ICD-10-CM | POA: Insufficient documentation

## 2019-04-23 ENCOUNTER — Other Ambulatory Visit: Payer: Self-pay

## 2019-04-23 DIAGNOSIS — A048 Other specified bacterial intestinal infections: Secondary | ICD-10-CM

## 2019-04-24 LAB — H. PYLORI BREATH TEST: H pylori Breath Test: NEGATIVE

## 2019-04-25 ENCOUNTER — Telehealth: Payer: Self-pay

## 2019-04-25 ENCOUNTER — Other Ambulatory Visit: Payer: Medicare Other

## 2019-04-25 NOTE — Telephone Encounter (Signed)
Pt notified of h pylori lab result.

## 2019-04-25 NOTE — Telephone Encounter (Signed)
-----   Message from Lucilla Lame, MD sent at 04/25/2019  8:16 AM EDT ----- Let the patient know that his H. pylori breath test was negative for infection.

## 2019-04-27 ENCOUNTER — Ambulatory Visit: Payer: Medicare Other | Attending: Internal Medicine

## 2019-04-27 DIAGNOSIS — Z23 Encounter for immunization: Secondary | ICD-10-CM

## 2019-04-27 NOTE — Progress Notes (Signed)
   [id number]  Name:  [name].    MRN: [id number] DOB: 04-Jan-1956  04/27/2019  Mr. [name] was observed post Covid-19 immunization for 15 minutes without incident. He was provided with Vaccine Information Sheet and instruction to access the V-Safe system.   Mr. [name] was instructed to call 911 with any severe reactions post vaccine: [name] Difficulty breathing  . Swelling of face and throat  . A fast heartbeat  . A bad rash all over body  . Dizziness and weakness   Immunizations Administered    Name Date Dose VIS Date Route   Pfizer COVID-19 Vaccine 04/27/2019  6:58 PM 0.3 mL 01/18/2019 Intramuscular   Manufacturer: [place]   Lot: [id number]   [place]: [id number]

## 2019-04-29 ENCOUNTER — Other Ambulatory Visit: Payer: Self-pay

## 2019-04-29 ENCOUNTER — Other Ambulatory Visit (INDEPENDENT_AMBULATORY_CARE_PROVIDER_SITE_OTHER): Payer: Medicare Other

## 2019-04-29 DIAGNOSIS — E785 Hyperlipidemia, unspecified: Secondary | ICD-10-CM | POA: Diagnosis not present

## 2019-04-29 LAB — CBC WITH DIFFERENTIAL/PLATELET
Basophils Absolute: 0 10*3/uL (ref 0.0–0.1)
Basophils Relative: 0.7 % (ref 0.0–3.0)
Eosinophils Absolute: 0 10*3/uL (ref 0.0–0.7)
Eosinophils Relative: 0.2 % (ref 0.0–5.0)
HCT: 46.9 % (ref 39.0–52.0)
Hemoglobin: 15.9 g/dL (ref 13.0–17.0)
Lymphocytes Relative: 7.7 % — ABNORMAL LOW (ref 12.0–46.0)
Lymphs Abs: 0.5 10*3/uL — ABNORMAL LOW (ref 0.7–4.0)
MCHC: 34 g/dL (ref 30.0–36.0)
MCV: 90.1 fl (ref 78.0–100.0)
Monocytes Absolute: 0.7 10*3/uL (ref 0.1–1.0)
Monocytes Relative: 11.4 % (ref 3.0–12.0)
Neutro Abs: 5 10*3/uL (ref 1.4–7.7)
Neutrophils Relative %: 80 % — ABNORMAL HIGH (ref 43.0–77.0)
Platelets: 157 10*3/uL (ref 150.0–400.0)
RBC: 5.2 Mil/uL (ref 4.22–5.81)
RDW: 13.5 % (ref 11.5–15.5)
WBC: 6.3 10*3/uL (ref 4.0–10.5)

## 2019-04-29 LAB — HEPATIC FUNCTION PANEL
ALT: 19 U/L (ref 0–53)
AST: 21 U/L (ref 0–37)
Albumin: 3.9 g/dL (ref 3.5–5.2)
Alkaline Phosphatase: 51 U/L (ref 39–117)
Bilirubin, Direct: 0.4 mg/dL — ABNORMAL HIGH (ref 0.0–0.3)
Total Bilirubin: 2.1 mg/dL — ABNORMAL HIGH (ref 0.2–1.2)
Total Protein: 6.7 g/dL (ref 6.0–8.3)

## 2019-04-29 LAB — LDL CHOLESTEROL, DIRECT: Direct LDL: 87 mg/dL

## 2019-04-30 LAB — RETICULOCYTES
ABS Retic: 57860 cells/uL (ref 25000–9000)
Retic Ct Pct: 1.1 %

## 2019-04-30 LAB — HAPTOGLOBIN: Haptoglobin: 145 mg/dL (ref 43–212)

## 2019-05-04 ENCOUNTER — Other Ambulatory Visit: Payer: Self-pay | Admitting: Family Medicine

## 2019-05-04 DIAGNOSIS — D72810 Lymphocytopenia: Secondary | ICD-10-CM

## 2019-05-08 ENCOUNTER — Telehealth: Payer: Self-pay | Admitting: Family Medicine

## 2019-05-08 NOTE — Telephone Encounter (Signed)
Pt would like a call back with lab results 

## 2019-05-08 NOTE — Telephone Encounter (Signed)
I called the patient and informed him of his results and he understood.  I scheduled him for lab in 2 weeks.  Soraiya Ahner,cma Tobey Lippard,cma

## 2019-05-16 ENCOUNTER — Other Ambulatory Visit: Payer: Self-pay | Admitting: Family Medicine

## 2019-05-17 ENCOUNTER — Ambulatory Visit (INDEPENDENT_AMBULATORY_CARE_PROVIDER_SITE_OTHER): Payer: Medicare Other | Admitting: Family Medicine

## 2019-05-17 ENCOUNTER — Encounter: Payer: Self-pay | Admitting: Family Medicine

## 2019-05-17 ENCOUNTER — Other Ambulatory Visit: Payer: Self-pay

## 2019-05-17 DIAGNOSIS — Z8619 Personal history of other infectious and parasitic diseases: Secondary | ICD-10-CM | POA: Insufficient documentation

## 2019-05-17 DIAGNOSIS — G8929 Other chronic pain: Secondary | ICD-10-CM

## 2019-05-17 DIAGNOSIS — F4323 Adjustment disorder with mixed anxiety and depressed mood: Secondary | ICD-10-CM

## 2019-05-17 DIAGNOSIS — D72810 Lymphocytopenia: Secondary | ICD-10-CM

## 2019-05-17 DIAGNOSIS — M542 Cervicalgia: Secondary | ICD-10-CM

## 2019-05-17 DIAGNOSIS — Z8616 Personal history of COVID-19: Secondary | ICD-10-CM | POA: Diagnosis not present

## 2019-05-17 DIAGNOSIS — I10 Essential (primary) hypertension: Secondary | ICD-10-CM

## 2019-05-17 LAB — CBC WITH DIFFERENTIAL/PLATELET
Basophils Absolute: 0.1 10*3/uL (ref 0.0–0.1)
Basophils Relative: 1.4 % (ref 0.0–3.0)
Eosinophils Absolute: 0.1 10*3/uL (ref 0.0–0.7)
Eosinophils Relative: 2.8 % (ref 0.0–5.0)
HCT: 44 % (ref 39.0–52.0)
Hemoglobin: 15.1 g/dL (ref 13.0–17.0)
Lymphocytes Relative: 44.7 % (ref 12.0–46.0)
Lymphs Abs: 2.2 10*3/uL (ref 0.7–4.0)
MCHC: 34.2 g/dL (ref 30.0–36.0)
MCV: 89.5 fl (ref 78.0–100.0)
Monocytes Absolute: 0.5 10*3/uL (ref 0.1–1.0)
Monocytes Relative: 10.9 % (ref 3.0–12.0)
Neutro Abs: 1.9 10*3/uL (ref 1.4–7.7)
Neutrophils Relative %: 40.2 % — ABNORMAL LOW (ref 43.0–77.0)
Platelets: 231 10*3/uL (ref 150.0–400.0)
RBC: 4.92 Mil/uL (ref 4.22–5.81)
RDW: 14 % (ref 11.5–15.5)
WBC: 4.8 10*3/uL (ref 4.0–10.5)

## 2019-05-17 MED ORDER — ALPRAZOLAM 0.5 MG PO TABS: ORAL_TABLET | ORAL | 1 refills | Status: DC

## 2019-05-17 NOTE — Assessment & Plan Note (Signed)
Patient underwent physical therapy.  He is following up with his surgeon to consider further steroid injections.  He notes they have advised surgery would be the last resort.

## 2019-05-17 NOTE — Assessment & Plan Note (Signed)
Patient is having some long-haul symptoms with brain fog.  Discussed the option of going to the COVID-19 clinic in [place] to see if there is anything else to do for this though he defers this at this time.  I discussed that the reaction he had to the vaccine is relatively common and does not prevent him from getting the second vaccine.  Advised that he should inform the people at the vaccine site of the symptoms to make sure they are okay with him having the second vaccine.

## 2019-05-17 NOTE — Assessment & Plan Note (Signed)
Stable.  Continue current regimen.  Controlled substance database reviewed.  I did discuss potentially increasing his Lexapro given that he does still have some symptoms though he declines this.

## 2019-05-17 NOTE — Patient Instructions (Signed)
Nice to see you. Please let us know if you would like to see the post-covid clinic for your brain fog.

## 2019-05-17 NOTE — Assessment & Plan Note (Signed)
Follow-up labs showed eradication.

## 2019-05-17 NOTE — Assessment & Plan Note (Signed)
Adequate control.  Continue losartan.

## 2019-05-17 NOTE — Progress Notes (Signed)
Tyler Rumps, MD Phone: [phone]  [name]. is a 64 y.o. male who presents today for f/u.  HYPERTENSION  Disease Monitoring  Home BP Monitoring similar to today Chest pain- no    Dyspnea on exertion- no Medications  Compliance-  Taking losartan.   Anxiety/depression: Patient notes he does have some issues with these though they are adequately controlled.  Continues on Lexapro.  Notes he does get some anxiety at night when he is lying down.  Does not like anything in his face and he has to get up to walk around feel like he can breathe.  Rarely takes Xanax.  He is on Lexapro.  No SI.  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  No known history of this.  H. pylori: He was treated for H pylori and notes it was eradicated.  COVID-19 history: Patient has a history of COVID-19.  Continues to have some brain fog intermittently.  Notes its not worse than it was.  Not all the time.  Occasionally forgets what he goes into a room for.  He did get his first Covid vaccine and is due to get it this weekend.  He had 101.5 degree temperature and headache as well as shaking with this.    Social History   Tobacco Use  Smoking Status Former Smoker  . Packs/day: 1.00  . Years: 20.00  . Pack years: 20.00  . Types: Cigarettes  . Quit date: 02/08/1991  . Years since quitting: 28.2  Smokeless Tobacco Never Used     ROS see history of present illness  Objective  Physical Exam Vitals:   05/17/19 0813  BP: 120/70  Pulse: 81  Temp: (!) 96.2 F (35.7 C)  SpO2: 98%    BP Readings from Last 3 Encounters:  05/17/19 120/70  02/28/19 (!) 149/75  01/29/19 134/78   Wt Readings from Last 3 Encounters:  05/17/19 208 lb 9.6 oz (94.6 kg)  02/28/19 208 lb 12.8 oz (94.7 kg)  01/29/19 206 lb 3.2 oz (93.5 kg)    Physical Exam Constitutional:      General: He is not in acute distress.    Appearance: He is not diaphoretic.  Cardiovascular:     Rate and Rhythm: Normal rate and  regular rhythm.     Heart sounds: Normal heart sounds.  Pulmonary:     Effort: Pulmonary effort is normal.     Breath sounds: Normal breath sounds.  Musculoskeletal:     Right lower leg: No edema.     Left lower leg: No edema.  Skin:    General: Skin is warm and dry.  Neurological:     Mental Status: He is alert.      Assessment/Plan: Please see individual problem list.  History of Helicobacter pylori infection Follow-up labs showed eradication.  History of COVID-19 Patient is having some long-haul symptoms with brain fog.  Discussed the option of going to the COVID-19 clinic in [place] to see if there is anything else to do for this though he defers this at this time.  I discussed that the reaction he had to the vaccine is relatively common and does not prevent him from getting the second vaccine.  Advised that he should inform the people at the vaccine site of the symptoms to make sure they are okay with him having the second vaccine.  Chronic neck pain Patient underwent physical therapy.  He is following up with his surgeon to consider further steroid injections.  He notes they  have advised surgery would be the last resort.  Adjustment disorder with mixed anxiety and depressed mood Stable.  Continue current regimen.  Controlled substance database reviewed.  I did discuss potentially increasing his Lexapro given that he does still have some symptoms though he declines this.  Hypertension Adequate control.  Continue losartan.    No orders of the defined types were placed in this encounter.   Meds ordered this encounter  Medications  . ALPRAZolam (XANAX) 0.5 MG tablet    Sig: TAKE 1 TABLET BY MOUTH EVERY NIGHT AT BEDTIME AS NEEDED FOR ANXIETY    Dispense:  20 tablet    Refill:  1    This visit occurred during the SARS-CoV-2 public health emergency.  Safety protocols were in place, including screening questions prior to the visit, additional usage of staff PPE, and  extensive cleaning of exam room while observing appropriate contact time as indicated for disinfecting solutions.    Tyler Rumps, MD [place]

## 2019-05-19 ENCOUNTER — Other Ambulatory Visit: Payer: Self-pay

## 2019-05-19 ENCOUNTER — Ambulatory Visit: Payer: Medicare Other | Attending: Internal Medicine

## 2019-05-19 DIAGNOSIS — Z23 Encounter for immunization: Secondary | ICD-10-CM

## 2019-05-19 NOTE — Progress Notes (Signed)
   [id number]  Name:  [name].    MRN: [id number] DOB: 1955-02-16  05/19/2019  [name] was observed post Covid-19 immunization for 15 minutes without incident. He was provided with Vaccine Information Sheet and instruction to access the V-Safe system.   [name] was instructed to call 911 with any severe reactions post vaccine: [name] Difficulty breathing  . Swelling of face and throat  . A fast heartbeat  . A bad rash all over body  . Dizziness and weakness   Immunizations Administered    Name Date Dose VIS Date Route   Pfizer COVID-19 Vaccine 05/19/2019  3:21 PM 0.3 mL 01/18/2019 Intramuscular   Manufacturer: [place]   Lot: [id number]   [place]: [id number]

## 2019-05-21 ENCOUNTER — Telehealth: Payer: Self-pay | Admitting: *Deleted

## 2019-05-21 NOTE — Telephone Encounter (Signed)
Appointment was cancelled per PCP.  Rondell Frick,cma

## 2019-05-21 NOTE — Telephone Encounter (Signed)
He does not need this lab appointment. We drew his labs last week.

## 2019-05-21 NOTE — Telephone Encounter (Signed)
Please place future orders for lab appt.  

## 2019-05-22 ENCOUNTER — Other Ambulatory Visit: Payer: Medicare Other

## 2019-05-23 ENCOUNTER — Encounter (HOSPITAL_BASED_OUTPATIENT_CLINIC_OR_DEPARTMENT_OTHER): Payer: Self-pay

## 2019-05-23 DIAGNOSIS — G4733 Obstructive sleep apnea (adult) (pediatric): Secondary | ICD-10-CM

## 2019-06-04 ENCOUNTER — Other Ambulatory Visit: Payer: Self-pay

## 2019-06-04 ENCOUNTER — Ambulatory Visit
Admission: RE | Admit: 2019-06-04 | Discharge: 2019-06-04 | Disposition: A | Discharge status: Home / Self Care | Payer: Medicare Other | Source: Ambulatory Visit | Attending: Family Medicine | Admitting: Family Medicine

## 2019-06-04 ENCOUNTER — Encounter: Payer: Self-pay | Admitting: Family Medicine

## 2019-06-04 ENCOUNTER — Telehealth (INDEPENDENT_AMBULATORY_CARE_PROVIDER_SITE_OTHER): Payer: Medicare Other | Admitting: Family Medicine

## 2019-06-04 VITALS — Ht 70.0 in | Wt 208.0 lb

## 2019-06-04 DIAGNOSIS — Z20822 Contact with and (suspected) exposure to covid-19: Secondary | ICD-10-CM | POA: Diagnosis not present

## 2019-06-04 DIAGNOSIS — R059 Cough, unspecified: Secondary | ICD-10-CM | POA: Insufficient documentation

## 2019-06-04 DIAGNOSIS — R05 Cough: Secondary | ICD-10-CM

## 2019-06-04 NOTE — Assessment & Plan Note (Addendum)
New onset issue.  This would seem unlikely related to the COVID-19 vaccine.  I am going to have him tested for COVID-19 though I think this is less likely a cause given that he did previously have Covid and had been at least partially vaccinated at the time of his symptom onset.  Will obtain a chest x-ray as well.  [facility] [place] [facility] and he knows to inform them of his symptoms and that he is getting tested.  Discussed quarantine at home as well.  Consider further evaluation based on work-up.  Given reasons to seek medical attention.

## 2019-06-04 NOTE — Progress Notes (Signed)
Virtual Visit via video Note  This visit type was conducted due to national recommendations for restrictions regarding the COVID-19 pandemic (e.g. social distancing).  This format is felt to be most appropriate for this patient at this time.  All issues noted in this document were discussed and addressed.  No physical exam was performed (except for noted visual exam findings with Video Visits).   I connected with [name] today at 11:30 AM EDT by a video enabled telemedicine application and verified that I am speaking with the correct person using two identifiers. Location patient: home Location provider: work Persons participating in the virtual visit: patient, provider  I discussed the limitations, risks, security and privacy concerns of performing an evaluation and management service by telephone and the availability of in person appointments. I also discussed with the patient that there may be a patient responsible charge related to this service. The patient expressed understanding and agreed to proceed.   Reason for visit: same   HPI: Cough: Patient notes onset of this over the last several weeks.  He does have chronic cough though this is different.  Feels like the cough is coming from his chest and feels congested in his chest.  Started after the second Covid vaccine on April 11.  No fevers.  No COVID-19 exposure.  No taste or smell disturbances.  No history of allergies.  He has not tried any medications for this.  He notes it does occasionally feel like it is reflux that is worse notes more of a productive cough.   ROS: See pertinent positives and negatives per HPI.  Past Medical History:  Diagnosis Date  . Anxiety   . Arthritis   . BPH (benign prostatic hyperplasia) 11/19/2013  . 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 Bulging disc    C2/3, C3/4, C6/7  . Cervical disc herniation    C4/5 and C5/6  . Colon polyp 04/18/2011  . Constipation 08/31/2012   . Depression   . Diverticulosis   . 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 GERD (gastroesophageal reflux disease)   . H/O diverticulitis of colon   . Hemorrhoid   . Hiatal hernia   . Hyperlipidemia   . Levoscoliosis   . Sleep difficulties   . Spinal stenosis in cervical region    cord abutment C4/5  . Testicular pain, left   . Typical atrial flutter ([place]) 11/2015   a. CHADS2VASc => 0; b. on Eliquis for pending ablation     Past Surgical History:  Procedure Laterality Date  . ATRIAL FLUTTER ABLATION  02/17/2016  . COLONOSCOPY  2014  . COLONOSCOPY WITH PROPOFOL N/A 06/30/2016   Procedure: COLONOSCOPY WITH PROPOFOL;  Surgeon: Lucilla Lame, MD;  Location: [facility];  Service: Endoscopy;  Laterality: N/A;  . ELECTROPHYSIOLOGIC STUDY N/A 01/04/2016   Procedure: Cardioversion;  Surgeon: Wellington Hampshire, MD;  Location: [facility];  Service: Cardiovascular;  Laterality: N/A;  . ELECTROPHYSIOLOGIC STUDY N/A 02/17/2016   Procedure: A-Flutter Ablation;  Surgeon: Deboraha Sprang, MD;  Location: [facility];  Service: Cardiovascular;  Laterality: N/A;  . GANGLION CYST EXCISION Right 1994   wrist & back  . HERNIA REPAIR Right 1994   abdominal repair with mesh  . NASAL SEPTUM SURGERY  2011   Dr. [name]     Family History  Problem Relation Age of Onset  . Hyperlipidemia Mother   . Cancer Mother  skin cancer?  . Heart disease Mother 31       MI - died in her sleep  . Hyperlipidemia Father   . Heart disease Father   . Kidney disease Neg Hx   . Prostate cancer Neg Hx   . Kidney cancer Neg Hx   . Bladder Cancer Neg Hx     SOCIAL HX: Former smoker   Current Outpatient Medications:  .  ALPRAZolam (XANAX) 0.5 MG tablet, TAKE 1 TABLET BY MOUTH EVERY NIGHT AT BEDTIME AS NEEDED FOR ANXIETY, Disp: 20 tablet, Rfl: 1 .  atorvastatin (LIPITOR) 20 MG tablet, TAKE 1 TABLET BY MOUTH IN  THE MORNING, Disp: 90 tablet, Rfl: 1 .  cyclobenzaprine (FLEXERIL)  10 MG tablet, TAKE 1 TABLET(10 MG) BY MOUTH THREE TIMES DAILY AS NEEDED FOR MUSCLE SPASMS, Disp: 90 tablet, Rfl: 0 .  escitalopram (LEXAPRO) 10 MG tablet, TAKE 1 TABLET BY MOUTH  DAILY, Disp: 90 tablet, Rfl: 3 .  finasteride (PROSCAR) 5 MG tablet, Take 1 tablet (5 mg total) by mouth daily., Disp: 90 tablet, Rfl: 3 .  gabapentin (NEURONTIN) 300 MG capsule, , Disp: , Rfl:  .  loratadine (CLARITIN) 10 MG tablet, Take 10 mg by mouth daily as needed for allergies., Disp: , Rfl:  .  losartan (COZAAR) 50 MG tablet, TAKE 1 TABLET(50 MG) BY MOUTH DAILY, Disp: 90 tablet, Rfl: 1 .  meloxicam (MOBIC) 15 MG tablet, TAKE 1 TABLET BY MOUTH  DAILY AS NEEDED FOR PAIN, Disp: 90 tablet, Rfl: 3 .  pantoprazole (PROTONIX) 40 MG tablet, TAKE 1 TABLET(40 MG) BY MOUTH DAILY, Disp: 90 tablet, Rfl: 1 .  tadalafil (CIALIS) 5 MG tablet, TAKE 1 TABLET(5 MG) BY MOUTH DAILY, Disp: 90 tablet, Rfl: 3 .  triamcinolone (NASACORT) 55 MCG/ACT AERO nasal inhaler, Place 2 sprays into the nose daily as needed (allergies). , Disp: , Rfl:   EXAM:  VITALS per patient if applicable:  GENERAL: alert, oriented, appears well and in no acute distress  HEENT: atraumatic, conjunttiva clear, no obvious abnormalities on inspection of external nose and ears  NECK: normal movements of the head and neck  LUNGS: on inspection no signs of respiratory distress, breathing rate appears normal, no obvious gross SOB, gasping or wheezing  CV: no obvious cyanosis  MS: moves all visible extremities without noticeable abnormality  PSYCH/NEURO: pleasant and cooperative, no obvious depression or anxiety, speech and thought processing grossly intact  ASSESSMENT AND PLAN:  Discussed the following assessment and plan:  Cough New onset issue.  This would seem unlikely related to the COVID-19 vaccine.  I am going to have him tested for COVID-19 though I think this is less likely a cause given that he did previously have Covid and had been at least  partially vaccinated at the time of his symptom onset.  Will obtain a chest x-ray as well.  [facility] and he knows to inform them of his symptoms and that he is getting tested.  Discussed quarantine at home as well.  Consider further evaluation based on work-up.  Given reasons to seek medical attention.   Orders Placed This Encounter  Procedures  . DG Chest 2 View    Standing Status:   Future    Standing Expiration Date:   08/03/2020    Order Specific Question:   Reason for Exam (SYMPTOM  OR DIAGNOSIS REQUIRED)    Answer:   cough, congestion, productive    Order Specific Question:  Preferred imaging location?    Answer:   [facility]    Order Specific Question:   Radiology Contrast Protocol - do NOT remove file path    Answer:   [email or web address]    No orders of the defined types were placed in this encounter.    I discussed the assessment and treatment plan with the patient. The patient was provided an opportunity to ask questions and all were answered. The patient agreed with the plan and demonstrated an understanding of the instructions.   The patient was advised to call back or seek an in-person evaluation if the symptoms worsen or if the condition fails to improve as anticipated.    [name], MD

## 2019-06-05 ENCOUNTER — Other Ambulatory Visit: Payer: Self-pay | Admitting: Urology

## 2019-06-05 DIAGNOSIS — N138 Other obstructive and reflux uropathy: Secondary | ICD-10-CM

## 2019-06-05 DIAGNOSIS — N529 Male erectile dysfunction, unspecified: Secondary | ICD-10-CM

## 2019-06-06 ENCOUNTER — Other Ambulatory Visit: Payer: Medicare Other

## 2019-06-15 ENCOUNTER — Ambulatory Visit: Payer: Medicare Other

## 2019-06-17 ENCOUNTER — Telehealth: Payer: Self-pay | Admitting: Gastroenterology

## 2019-06-17 ENCOUNTER — Other Ambulatory Visit: Payer: Self-pay

## 2019-06-17 MED ORDER — TRULANCE 3 MG PO TABS: 3.0000 mg | ORAL_TABLET | Freq: Every day | ORAL | 3 refills | Status: DC

## 2019-06-17 NOTE — Telephone Encounter (Signed)
Patient calling wanting medication Trulance called into pharmacy. Pt states if it is 30 days to send it to [facility] [address] and if 90 days send to [facility].

## 2019-06-17 NOTE — Telephone Encounter (Signed)
Prescription for trulance 3mg  has been sent [facility].

## 2019-06-28 ENCOUNTER — Other Ambulatory Visit: Payer: Self-pay | Admitting: Family Medicine

## 2019-06-28 DIAGNOSIS — E782 Mixed hyperlipidemia: Secondary | ICD-10-CM

## 2019-07-06 ENCOUNTER — Other Ambulatory Visit: Payer: Self-pay | Admitting: Family Medicine

## 2019-07-06 ENCOUNTER — Other Ambulatory Visit: Payer: Self-pay | Admitting: Urology

## 2019-07-06 DIAGNOSIS — R972 Elevated prostate specific antigen [PSA]: Secondary | ICD-10-CM

## 2019-07-19 ENCOUNTER — Other Ambulatory Visit: Payer: Self-pay | Admitting: Family Medicine

## 2019-07-19 DIAGNOSIS — G8929 Other chronic pain: Secondary | ICD-10-CM

## 2019-08-05 ENCOUNTER — Other Ambulatory Visit: Payer: Self-pay | Admitting: Family Medicine

## 2019-08-05 ENCOUNTER — Encounter (HOSPITAL_BASED_OUTPATIENT_CLINIC_OR_DEPARTMENT_OTHER): Payer: Medicare Other | Admitting: Internal Medicine

## 2019-08-05 DIAGNOSIS — K219 Gastro-esophageal reflux disease without esophagitis: Secondary | ICD-10-CM

## 2019-08-08 ENCOUNTER — Ambulatory Visit: Payer: Medicare Other | Admitting: Student in an Organized Health Care Education/Training Program

## 2019-08-21 ENCOUNTER — Other Ambulatory Visit: Payer: Self-pay | Admitting: *Deleted

## 2019-08-21 DIAGNOSIS — N529 Male erectile dysfunction, unspecified: Secondary | ICD-10-CM

## 2019-08-21 DIAGNOSIS — N401 Enlarged prostate with lower urinary tract symptoms: Secondary | ICD-10-CM

## 2019-08-21 DIAGNOSIS — N138 Other obstructive and reflux uropathy: Secondary | ICD-10-CM

## 2019-08-21 MED ORDER — TADALAFIL 5 MG PO TABS: ORAL_TABLET | ORAL | 2 refills | Status: DC

## 2019-08-21 NOTE — Telephone Encounter (Signed)
Having trouble with pa going to [facility]

## 2019-11-15 ENCOUNTER — Other Ambulatory Visit: Payer: Self-pay | Admitting: Family Medicine

## 2019-11-15 DIAGNOSIS — N138 Other obstructive and reflux uropathy: Secondary | ICD-10-CM

## 2019-11-19 ENCOUNTER — Other Ambulatory Visit: Payer: Self-pay

## 2019-11-19 ENCOUNTER — Other Ambulatory Visit: Payer: Medicare Other

## 2019-11-19 DIAGNOSIS — N138 Other obstructive and reflux uropathy: Secondary | ICD-10-CM

## 2019-11-20 LAB — PSA: Prostate Specific Ag, Serum: 1.4 ng/mL (ref 0.0–4.0)

## 2019-11-25 NOTE — Progress Notes (Signed)
9:03 AM   [name]. 1955/06/29 [id number]  Referring provider: [name], MD [address] [place],  [place] [address]  Chief Complaint  Patient presents with  . Benign Prostatic Hypertrophy    HPI: [name] is a 64 year old male with BPH, OAB, nocturia, PFD and ED who presents today for one year follow up.  BPH WITH LUTS  (prostate and/or bladder) IPSS score: 19/3   PVR: 89 mL    Previous score: 21/3   Previous PVR: 66 mL  Major complaint(s): Frequency, urgency and difficulty urinating.  Patient denies any modifying or aggravating factors.  Patient denies any gross hematuria, dysuria or suprapubic/flank pain.  Patient denies any fevers, chills, nausea or vomiting.   Restricts fluids: drinks two cans of sodas, unsweet tea, a small cup of coffee and a small glass of orange juice.  He has sleep apnea and does not sleep with the CPAP due to claustrophobia.    Currently taking: Finasteride 5 mg daily and Cialis 5 mg daily.     Found Myrbetriq cost prohibitive and ineffective.  PT was cost prohibitive.  Per patient, had a cystoscopy several years ago and he will never have another one as it was very painful for him.  He states that nothing was found.    He does not have a family history of PCa.   Today's UA is benign.   IPSS    Row Name 11/26/19 0800         International Prostate Symptom Score   How often have you had the sensation of not emptying your bladder? About half the time     How often have you had to urinate less than every two hours? About half the time     How often have you found you stopped and started again several times when you urinated? Less than half the time     How often have you found it difficult to postpone urination? About half the time     How often have you had a weak urinary stream? About half the time     How often have you had to strain to start urination? Less than half the time     How many times did you typically  get up at night to urinate? 3 Times     Total IPSS Score 19       Quality of Life due to urinary symptoms   If you were to spend the rest of your life with your urinary condition just the way it is now how would you feel about that? Mixed            Score:  1-7 Mild 8-19 Moderate 20-35 Severe  Nocturia 3 to 4 times a night.   Cannot tolerate CPAP.  Dental device was not tolerated.    Erectile dysfunction His SHIM score is 12, which is mild to moderate ED.   His previous SHIM score was 19.  He has been having difficulty with erections for several years.   His major complaint is no sensation.  His libido is diminished.   His risk factors for ED are age, BPH, HTN, HLD, sleep apnea and depression.  He is not very interested in sexual activity.  He is not having spontaneous erections.  He is not painful to have erections.  He does not have a curve.    [place] Name 11/26/19 [phone]  SHIM: Over the last 6 months:   How do you rate your confidence that you could get and keep an erection? Low     When you had erections with sexual stimulation, how often were your erections hard enough for penetration (entering your partner)? A Few Times (much less than half the time)     During sexual intercourse, how often were you able to maintain your erection after you had penetrated (entered) your partner? A Few Times (much less than half the time)     During sexual intercourse, how difficult was it to maintain your erection to completion of intercourse? Slightly Difficult     When you attempted sexual intercourse, how often was it satisfactory for you? A Few Times (much less than half the time)       SHIM Total Score   SHIM 12            Score: 1-7 Severe ED 8-11 Moderate ED 12-16 Mild-Moderate ED 17-21 Mild ED 22-25 No ED  He continues to have intermittent left scrotal pain which he contributes to his epididymal cyst.  The pain is been present for several years.  And he has not  noticed any changes on his self testicular exam.  PMH: Past Medical History:  Diagnosis Date  . Anxiety   . Arthritis   . BPH (benign prostatic hyperplasia) 11/19/2013  . 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 Bulging disc    C2/3, C3/4, C6/7  . Cervical disc herniation    C4/5 and C5/6  . Colon polyp 04/18/2011  . Constipation 08/31/2012  . Depression   . Diverticulosis   . 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 GERD (gastroesophageal reflux disease)   . H/O diverticulitis of colon   . Hemorrhoid   . Hiatal hernia   . Hyperlipidemia   . Levoscoliosis   . Sleep difficulties   . Spinal stenosis in cervical region    cord abutment C4/5  . Testicular pain, left   . Typical atrial flutter ([place]) 11/2015   a. CHADS2VASc => 0; b. on Eliquis for pending ablation     Surgical History: Past Surgical History:  Procedure Laterality Date  . ATRIAL FLUTTER ABLATION  02/17/2016  . COLONOSCOPY  2014  . COLONOSCOPY WITH PROPOFOL N/A 06/30/2016   Procedure: COLONOSCOPY WITH PROPOFOL;  Surgeon: Lucilla Lame, MD;  Location: [facility];  Service: Endoscopy;  Laterality: N/A;  . ELECTROPHYSIOLOGIC STUDY N/A 01/04/2016   Procedure: Cardioversion;  Surgeon: Wellington Hampshire, MD;  Location: [facility];  Service: Cardiovascular;  Laterality: N/A;  . ELECTROPHYSIOLOGIC STUDY N/A 02/17/2016   Procedure: A-Flutter Ablation;  Surgeon: Deboraha Sprang, MD;  Location: [facility];  Service: Cardiovascular;  Laterality: N/A;  . GANGLION CYST EXCISION Right 1994   wrist & back  . HERNIA REPAIR Right 1994   abdominal repair with mesh  . NASAL SEPTUM SURGERY  2011   Dr. [name]     Home Medications:  Allergies as of 11/26/2019      Reactions   Other Other (See Comments)   Pollen:  Sinus infection, cough, fatigue      Medication List       Accurate as of November 26, 2019  9:03 AM. If you have any questions, ask your nurse or doctor.          ALPRAZolam 0.5 MG tablet Commonly known as: XANAX TAKE 1 TABLET BY MOUTH EVERY  NIGHT AT BEDTIME AS NEEDED FOR ANXIETY   atorvastatin 20 MG tablet Commonly known as: LIPITOR TAKE 1 TABLET BY MOUTH IN  THE MORNING   cyclobenzaprine 10 MG tablet Commonly known as: FLEXERIL TAKE 1 TABLET BY MOUTH 3  TIMES DAILY AS NEEDED FOR  MUSCLE SPASMS   escitalopram 10 MG tablet Commonly known as: LEXAPRO TAKE 1 TABLET BY MOUTH  DAILY   finasteride 5 MG tablet Commonly known as: PROSCAR TAKE 1 TABLET BY MOUTH  DAILY   gabapentin 300 MG capsule Commonly known as: NEURONTIN   loratadine 10 MG tablet Commonly known as: CLARITIN Take 10 mg by mouth daily as needed for allergies.   losartan 50 MG tablet Commonly known as: COZAAR TAKE 1 TABLET BY MOUTH  DAILY   meloxicam 15 MG tablet Commonly known as: MOBIC TAKE 1 TABLET BY MOUTH  DAILY AS NEEDED FOR PAIN   pantoprazole 40 MG tablet Commonly known as: PROTONIX TAKE 1 TABLET BY MOUTH  DAILY   tadalafil 5 MG tablet Commonly known as: CIALIS TAKE 1 TABLET BY MOUTH  DAILY What changed: Another medication with the same name was added. Make sure you understand how and when to take each. Changed by: Zara Council, [name]   tadalafil 5 MG tablet Commonly known as: CIALIS Take 1 tablet (5 mg total) by mouth daily as needed for erectile dysfunction. What changed: You were already taking a medication with the same name, and this prescription was added. Make sure you understand how and when to take each. Changed by: Zara Council, [name]   triamcinolone 55 MCG/ACT Aero nasal inhaler Commonly known as: NASACORT Place 2 sprays into the nose daily as needed (allergies).   Trulance 3 MG Tabs Generic drug: Plecanatide Take 3 mg by mouth daily.       Allergies:  Allergies  Allergen Reactions  . Other Other (See Comments)    Pollen:  Sinus infection, cough, fatigue     Family History: Family History  Problem Relation Age of Onset   . Hyperlipidemia Mother   . Cancer Mother        skin cancer?  . Heart disease Mother 42       MI - died in her sleep  . Hyperlipidemia Father   . Heart disease Father   . Kidney disease Neg Hx   . Prostate cancer Neg Hx   . Kidney cancer Neg Hx   . Bladder Cancer Neg Hx     Social History:  reports that he quit smoking about 28 years ago. His smoking use included cigarettes. He has a 20.00 pack-year smoking history. He has never used smokeless tobacco. He reports that he does not drink alcohol and does not use drugs.  ROS: For pertinent review of systems please refer to history of present illness  Physical Exam: BP 133/75 (BP Location: Left Arm, Patient Position: Sitting, Cuff Size: Normal)   Pulse 71   Ht 5\' 10"  (1.778 m)   Wt 202 lb 9.6 oz (91.9 kg)   BMI 29.07 kg/m   Constitutional:  Well nourished. Alert and oriented, No acute distress. HEENT: [place] AT, mask in place.  Trachea midline Cardiovascular: No clubbing, cyanosis, or edema. Respiratory: Normal respiratory effort, no increased work of breathing. GU: No CVA tenderness.  No bladder fullness or masses.  Patient with circumcised phallus.  Urethral meatus is patent.  No penile discharge. No penile lesions or rashes. Scrotum without lesions, cysts, rashes and/or edema.  Testicles are located scrotally bilaterally.  No masses are appreciated in the testicles. Right epididymis is normal.  3 mm left epididymal cyst is noted.   Rectal: Patient with  normal sphincter tone. Anus and perineum without scarring or rashes. No rectal masses are appreciated. Prostate is approximately 45 grams, could only palpate the apex and midportion of the gland, known nodules are appreciated. Seminal vesicles could not be palpated Skin: No rashes, bruises or suspicious lesions. Lymph: No inguinal adenopathy. Neurologic: Grossly intact, no focal deficits, moving all 4 extremities. Psychiatric: Normal mood and affect.   Laboratory Data: Lab Results   Component Value Date   WBC 4.8 05/17/2019   HGB 15.1 05/17/2019   HCT 44.0 05/17/2019   MCV 89.5 05/17/2019   PLT 231.0 05/17/2019    Lab Results  Component Value Date   CREATININE 0.94 01/18/2019    Lab Results  Component Value Date   PSA 3.49 11/19/2013   Component     Latest Ref Rng & Units 11/24/2014          Prostate Specific Ag, Serum     0.0 - 4.0 ng/mL 3.6   Component     Latest Ref Rng & Units 11/23/2015 11/23/2015 12/28/2015 11/16/2016         8:20 AM  8:20 AM    Prostate Specific Ag, Serum     0.0 - 4.0 ng/mL 4.8 (H) 4.4 (H) 2.8 1.9   Component     Latest Ref Rng & Units 11/20/2017 11/21/2018 11/19/2019            Prostate Specific Ag, Serum     0.0 - 4.0 ng/mL 1.6 1.8 1.4    Corrected value (2.8)    Lab Results  Component Value Date   TSH 0.585 11/29/2015       Component Value Date/Time   CHOL 175 03/13/2019 0802   HDL 50.50 03/13/2019 0802   CHOLHDL 3 03/13/2019 0802   VLDL 22.2 03/13/2019 0802   LDLCALC 103 (H) 03/13/2019 0802   Urinalysis Component     Latest Ref Rng & Units 11/26/2019  Specific Gravity, UA     1.005 - 1.030 1.015  pH, UA     5.0 - 7.5 5.0  Color, UA     Yellow Yellow  Appearance Ur     Clear Clear  Leukocytes,UA     Negative Negative  Protein,UA     Negative/Trace Negative  Glucose, UA     Negative Negative  Ketones, UA     Negative Negative  RBC, UA     Negative Negative  Bilirubin, UA     Negative Negative  Urobilinogen, Ur     0.2 - 1.0 mg/dL 0.2  Nitrite, UA     Negative Negative  Microscopic Examination      See below:   Component     Latest Ref Rng & Units 11/26/2019  WBC, UA     0 - 5 /hpf 0-5  RBC     0 - 2 /hpf 0-2  Epithelial Cells (non renal)     0 - 10 /hpf 0-10  Bacteria, UA     None seen/Few None seen    I have reviewed the labs.  Pertinent Imaging Results for [name], [name] (MRN [id number]) as of 11/26/2019 08:36  Ref. Range 11/26/2019 08:26  Scan Result Unknown 34mL     Assessment & Plan:    1. BPH (benign prostatic hyperplasia) with LUTS:     IPSS score is 19/3, it is  improved Continue the finasteride 5 mg and Cialis 5 mg; refills given PSA 1.4 (2.8)  Patient is still vehemently against undergoing cystoscopy at this time to see if he would qualify for a bladder outlet procedure. RTC in 12 months for IPSS, PSA and PVR    2. Nocturia  Cannot tolerate CPAP machine  3. ED SHIM score is 12, it is worsened  He states that he is not sexually active RTC in one year for SHIM   4. Epididymal cysts Unchanged on physical exam Continue to monitor  Return in about 1 year (around 11/25/2020) for UA, PSA, IPSS, SH IM, PVR and exam.  These notes generated with voice recognition software. I apologize for typographical errors.  Zara Council, [name]  [facility] [address] [place], [place] [address] [phone]

## 2019-11-26 ENCOUNTER — Ambulatory Visit (INDEPENDENT_AMBULATORY_CARE_PROVIDER_SITE_OTHER): Payer: Medicare Other | Admitting: Urology

## 2019-11-26 ENCOUNTER — Other Ambulatory Visit: Payer: Self-pay

## 2019-11-26 ENCOUNTER — Encounter: Payer: Self-pay | Admitting: Urology

## 2019-11-26 VITALS — BP 133/75 | HR 71 | Ht 70.0 in | Wt 202.6 lb

## 2019-11-26 DIAGNOSIS — N401 Enlarged prostate with lower urinary tract symptoms: Secondary | ICD-10-CM | POA: Diagnosis not present

## 2019-11-26 DIAGNOSIS — N529 Male erectile dysfunction, unspecified: Secondary | ICD-10-CM

## 2019-11-26 DIAGNOSIS — R351 Nocturia: Secondary | ICD-10-CM

## 2019-11-26 DIAGNOSIS — N138 Other obstructive and reflux uropathy: Secondary | ICD-10-CM

## 2019-11-26 LAB — URINALYSIS, COMPLETE
Bilirubin, UA: NEGATIVE
Glucose, UA: NEGATIVE
Ketones, UA: NEGATIVE
Leukocytes,UA: NEGATIVE
Nitrite, UA: NEGATIVE
Protein,UA: NEGATIVE
RBC, UA: NEGATIVE
Specific Gravity, UA: 1.015 (ref 1.005–1.030)
Urobilinogen, Ur: 0.2 mg/dL (ref 0.2–1.0)
pH, UA: 5 (ref 5.0–7.5)

## 2019-11-26 LAB — MICROSCOPIC EXAMINATION: Bacteria, UA: NONE SEEN

## 2019-11-26 LAB — BLADDER SCAN AMB NON-IMAGING

## 2019-11-26 MED ORDER — TADALAFIL 5 MG PO TABS: 5.0000 mg | ORAL_TABLET | Freq: Every day | ORAL | 0 refills | Status: DC | PRN

## 2019-12-11 ENCOUNTER — Other Ambulatory Visit: Payer: Self-pay | Admitting: Family Medicine

## 2019-12-12 NOTE — Telephone Encounter (Signed)
Patient needs follow-up. One refill sent in. Please contact him to schedule follow-up.

## 2019-12-19 ENCOUNTER — Other Ambulatory Visit: Payer: Self-pay | Admitting: Family Medicine

## 2019-12-19 DIAGNOSIS — G8929 Other chronic pain: Secondary | ICD-10-CM

## 2019-12-19 DIAGNOSIS — M5442 Lumbago with sciatica, left side: Secondary | ICD-10-CM

## 2020-03-04 ENCOUNTER — Telehealth (INDEPENDENT_AMBULATORY_CARE_PROVIDER_SITE_OTHER): Payer: Medicare Other | Admitting: Family Medicine

## 2020-03-04 ENCOUNTER — Encounter: Payer: Self-pay | Admitting: Family Medicine

## 2020-03-04 DIAGNOSIS — R053 Chronic cough: Secondary | ICD-10-CM

## 2020-03-04 DIAGNOSIS — K219 Gastro-esophageal reflux disease without esophagitis: Secondary | ICD-10-CM | POA: Diagnosis not present

## 2020-03-04 DIAGNOSIS — R21 Rash and other nonspecific skin eruption: Secondary | ICD-10-CM | POA: Diagnosis not present

## 2020-03-04 DIAGNOSIS — F4323 Adjustment disorder with mixed anxiety and depressed mood: Secondary | ICD-10-CM | POA: Diagnosis not present

## 2020-03-04 MED ORDER — METRONIDAZOLE 1 % EX GEL: Freq: Every day | CUTANEOUS | 0 refills | Status: DC

## 2020-03-04 MED ORDER — ALPRAZOLAM 0.5 MG PO TABS: ORAL_TABLET | ORAL | 0 refills | Status: DC

## 2020-03-04 NOTE — Assessment & Plan Note (Signed)
Symptoms he describes do not seem to be consistent with GERD.  We will have him continue his Protonix.

## 2020-03-04 NOTE — Assessment & Plan Note (Signed)
Stable.  He will continue Lexapro 10 mg once daily and Xanax 0.5 mg as needed at night for anxiety.

## 2020-03-04 NOTE — Progress Notes (Signed)
Virtual Visit via video Note  This visit type was conducted due to national recommendations for restrictions regarding the COVID-19 pandemic (e.g. social distancing).  This format is felt to be most appropriate for this patient at this time.  All issues noted in this document were discussed and addressed.  No physical exam was performed (except for noted visual exam findings with Video Visits).   I connected with [name] today at  2:45 PM EST by a video enabled telemedicine application and verified that I am speaking with the correct person using two identifiers. Location patient: home Location provider: home office Persons participating in the virtual visit: patient, provider  I discussed the limitations, risks, security and privacy concerns of performing an evaluation and management service by telephone and the availability of in person appointments. I also discussed with the patient that there may be a patient responsible charge related to this service. The patient expressed understanding and agreed to proceed.  Reason for visit: Follow-up  HPI: GERD: Patient takes Protonix.  Notes no reflux though does report a funny feeling in his chest as if he has a cold.  This has been going on for a long time.  This might be slightly worse recently.  Does get phlegm in his throat.  No abdominal pain.  No blood in stool.  No dysphagia.  Chronic cough: Patient continues to have issues with this.  Possibly slightly worse recently.  Does have a funny sensation in his chest that has been going on for quite some time that he states feels like when you have a cold though it has been going on chronically.  He does occasionally get out of breath with activity.  He did have a negative home COVID test though his symptoms have been going on for quite some time now.  He has not seen pulmonology for the symptoms previously.  Rash: Patient reports rash on his forehead and cheeks.  Its red and has some pimple type  lesions.  It is worse if he is hot.  Worse with sun exposure.  Anxiety/depression: This is stable.  He notes he has occasional moments where the depression worsens though nothing significant.  No SI.  He is on Lexapro.  He rarely takes Xanax.   ROS: See pertinent positives and negatives per HPI.  Past Medical History:  Diagnosis Date  . Anxiety   . Arthritis   . BPH (benign prostatic hyperplasia) 11/19/2013  . 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 Bulging disc    C2/3, C3/4, C6/7  . Cervical disc herniation    C4/5 and C5/6  . Colon polyp 04/18/2011  . Constipation 08/31/2012  . Depression   . Diverticulosis   . 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 GERD (gastroesophageal reflux disease)   . H/O diverticulitis of colon   . Hemorrhoid   . Hiatal hernia   . Hyperlipidemia   . Levoscoliosis   . Sleep difficulties   . Spinal stenosis in cervical region    cord abutment C4/5  . Testicular pain, left   . Typical atrial flutter ([place]) 11/2015   a. CHADS2VASc => 0; b. on Eliquis for pending ablation     Past Surgical History:  Procedure Laterality Date  . ATRIAL FLUTTER ABLATION  02/17/2016  . COLONOSCOPY  2014  . COLONOSCOPY WITH PROPOFOL N/A 06/30/2016   Procedure: COLONOSCOPY WITH PROPOFOL;  Surgeon: Lucilla Lame, MD;  Location: [facility];  Service: Endoscopy;  Laterality: N/A;  . ELECTROPHYSIOLOGIC STUDY N/A 01/04/2016   Procedure: Cardioversion;  Surgeon: Iran OuchMuhammad A Arida, MD;  Location: [facility];  Service: Cardiovascular;  Laterality: N/A;  . ELECTROPHYSIOLOGIC STUDY N/A 02/17/2016   Procedure: A-Flutter Ablation;  Surgeon: Duke SalviaSteven C Klein, MD;  [facility];  Service: Cardiovascular;  Laterality: N/A;  . GANGLION CYST EXCISION Right 1994   wrist & back  . HERNIA REPAIR Right 1994   abdominal repair with mesh  . NASAL SEPTUM SURGERY  2011   Dr. Willeen CassBennett     Family History  Problem Relation Age of  Onset  . Hyperlipidemia Mother   . Cancer Mother        skin cancer?  . Heart disease Mother [id number]       MI - died in her sleep  . Hyperlipidemia Father   . Heart disease Father   . Kidney disease Neg Hx   . Prostate cancer Neg Hx   . Kidney cancer Neg Hx   . Bladder Cancer Neg Hx     SOCIAL HX: Former smoker   Current Outpatient Medications:  .  atorvastatin (LIPITOR) 20 MG tablet, TAKE 1 TABLET BY MOUTH IN  THE MORNING, Disp: 90 tablet, Rfl: 3 .  cyclobenzaprine (FLEXERIL) 10 MG tablet, TAKE 1 TABLET BY MOUTH 3  TIMES DAILY AS NEEDED FOR  MUSCLE SPASMS, Disp: 90 tablet, Rfl: 0 .  escitalopram (LEXAPRO) 10 MG tablet, TAKE 1 TABLET BY MOUTH  DAILY, Disp: 90 tablet, Rfl: 3 .  finasteride (PROSCAR) 5 MG tablet, TAKE 1 TABLET BY MOUTH  DAILY, Disp: 90 tablet, Rfl: 3 .  loratadine (CLARITIN) 10 MG tablet, Take 10 mg by mouth daily as needed for allergies., Disp: , Rfl:  .  losartan (COZAAR) 50 MG tablet, TAKE 1 TABLET BY MOUTH  DAILY, Disp: 90 tablet, Rfl: 3 .  meloxicam (MOBIC) 15 MG tablet, TAKE 1 TABLET BY MOUTH  DAILY AS NEEDED FOR PAIN, Disp: 90 tablet, Rfl: 3 .  metroNIDAZOLE (METROGEL) 1 % gel, Apply topically daily., Disp: 60 g, Rfl: 0 .  pantoprazole (PROTONIX) 40 MG tablet, TAKE 1 TABLET BY MOUTH  DAILY, Disp: 90 tablet, Rfl: 3 .  Plecanatide (TRULANCE) 3 MG TABS, Take 3 mg by mouth daily., Disp: 90 tablet, Rfl: 3 .  tadalafil (CIALIS) 5 MG tablet, TAKE 1 TABLET BY MOUTH  DAILY, Disp: 90 tablet, Rfl: 2 .  tadalafil (CIALIS) 5 MG tablet, Take 1 tablet (5 mg total) by mouth daily as needed for erectile dysfunction., Disp: 90 tablet, Rfl: 0 .  triamcinolone (NASACORT) 55 MCG/ACT AERO nasal inhaler, Place 2 sprays into the nose daily as needed (allergies). , Disp: , Rfl:  .  ALPRAZolam (XANAX) 0.5 MG tablet, TAKE 1 TABLET BY MOUTH EVERY NIGHT AT BEDTIME AS NEEDED FOR ANXIETY, Disp: 20 tablet, Rfl: 0  EXAM:  VITALS per patient if applicable:  GENERAL: alert, oriented, appears well  and in no acute distress  HEENT: atraumatic, conjunttiva clear, no obvious abnormalities on inspection of external nose and ears  NECK: normal movements of the head and neck  LUNGS: on inspection no signs of respiratory distress, breathing rate appears normal, no obvious gross SOB, gasping or wheezing  CV: no obvious cyanosis  MS: moves all visible extremities without noticeable abnormality  PSYCH/NEURO: pleasant and cooperative, no obvious depression or anxiety, speech and thought processing grossly intact  ASSESSMENT AND PLAN:  Discussed the following assessment and plan:  Problem List Items  Addressed This Visit    Adjustment disorder with mixed anxiety and depressed mood    Stable.  He will continue Lexapro 10 mg once daily and Xanax 0.5 mg as needed at night for anxiety.      Relevant Medications   ALPRAZolam (XANAX) 0.5 MG tablet   Chronic cough    Patient with ongoing issues with this.  Question whether or not he has asthma or COPD that is contributing.  We will provide with an albuterol inhaler to use as needed.  Will refer back to his pulmonologist for further evaluation.      Relevant Orders   Ambulatory referral to Pulmonology   GERD (gastroesophageal reflux disease)    Symptoms he describes do not seem to be consistent with GERD.  We will have him continue his Protonix.      Rash    This could possibly be rosacea.  We will trial MetroGel see if that is beneficial.      Relevant Medications   metroNIDAZOLE (METROGEL) 1 % gel       I discussed the assessment and treatment plan with the patient. The patient was provided an opportunity to ask questions and all were answered. The patient agreed with the plan and demonstrated an understanding of the instructions.   The patient was advised to call back or seek an in-person evaluation if the symptoms worsen or if the condition fails to improve as anticipated.   [name], MD

## 2020-03-04 NOTE — Assessment & Plan Note (Signed)
Patient with ongoing issues with this.  Question whether or not he has asthma or COPD that is contributing.  We will provide with an albuterol inhaler to use as needed.  Will refer back to his pulmonologist for further evaluation.

## 2020-03-04 NOTE — Assessment & Plan Note (Signed)
This could possibly be rosacea.  We will trial MetroGel see if that is beneficial.

## 2020-03-05 ENCOUNTER — Telehealth: Payer: Self-pay

## 2020-03-05 MED ORDER — ALBUTEROL SULFATE HFA 108 (90 BASE) MCG/ACT IN AERS: 2.0000 | INHALATION_SPRAY | Freq: Four times a day (QID) | RESPIRATORY_TRACT | 0 refills | Status: DC | PRN

## 2020-03-05 NOTE — Telephone Encounter (Signed)
Just sent to [facility].

## 2020-03-05 NOTE — Telephone Encounter (Signed)
Called and spoke to [place]. Ferlin verbalized understanding and had no further questions.

## 2020-03-05 NOTE — Telephone Encounter (Signed)
Pt had a virtual yesterday and he said Dr. Caryl Bis was going to call in an inhaler for him but when he went to pick it up from pharmacy it wasn't there. He is needing to know when this will be called in so he can go pick it up from pharmacy?

## 2020-03-20 ENCOUNTER — Other Ambulatory Visit: Payer: Self-pay | Admitting: Family Medicine

## 2020-03-29 ENCOUNTER — Other Ambulatory Visit: Payer: Self-pay | Admitting: Family Medicine

## 2020-03-30 ENCOUNTER — Other Ambulatory Visit: Payer: Self-pay | Admitting: Family Medicine

## 2020-04-01 ENCOUNTER — Encounter: Payer: Self-pay | Admitting: Internal Medicine

## 2020-04-01 ENCOUNTER — Other Ambulatory Visit: Payer: Self-pay

## 2020-04-01 ENCOUNTER — Ambulatory Visit (INDEPENDENT_AMBULATORY_CARE_PROVIDER_SITE_OTHER): Payer: Medicare Other | Admitting: Internal Medicine

## 2020-04-01 VITALS — BP 132/78 | HR 78 | Temp 98.0°F | Ht 70.0 in | Wt 204.0 lb

## 2020-04-01 DIAGNOSIS — R0602 Shortness of breath: Secondary | ICD-10-CM

## 2020-04-01 DIAGNOSIS — J683 Other acute and subacute respiratory conditions due to chemicals, gases, fumes and vapors: Secondary | ICD-10-CM

## 2020-04-01 NOTE — Progress Notes (Signed)
Referring provider: [name], MD   SYNOPSIS 65 year old male seen for pulmonary consult September 2019 for sleep apnea and cough Medical history significant for A. Fib previous ablation   TEST/EVENTS :  --Sleep study 01/05/16, AHI equals 60.  CC  Follow up severe OSA Follow-up cough and shortness of breath   HPI Severe OSA based on sleep study AHI of 60 diagnosed in 2017  Previous report Patient declined CPAP Highly recommendation of CPAP but declined Referred to ENT for oral appliance  he has not been wearing his oral appliance -Says it was uncomfortable caused severe dry mouth and jaw pain.  He continues to have daytime sleepiness.    For inspire electrical stimulation device however patient refused to undergo any type of surgical procedure to place stimulator in to his body  At this time patient also has intermittent cough and wheezing with some shortness of breath most likely related to uncontrolled acid reflux patient does have a diagnosis of a hiatal hernia  Patient showing signs symptoms of intermittent reactive airways disease most likely from active acid reflux  No exacerbation at this time No evidence of heart failure at this time No evidence or signs of infection at this time No respiratory distress No fevers, chills, nausea, vomiting, diarrhea No evidence of lower extremity edema No evidence hemoptysis   Allergies  Allergen Reactions  . Other Other (See Comments)    Pollen:  Sinus infection, cough, fatigue     Immunization History  Administered Date(s) Administered  . Influenza Whole 11/18/2010  . Influenza,inj,Quad PF,6+ Mos 10/17/2012, 11/02/2013, 10/16/2014, 10/12/2016, 10/17/2017, 10/17/2018, 10/17/2019  . Influenza-Unspecified 11/02/2011, 10/17/2012, 11/02/2013, 10/22/2015, 10/17/2019  . PFIZER(Purple Top)SARS-COV-2 Vaccination 04/27/2019, 05/19/2019, 01/15/2020  . Tdap 10/17/2012    Past Medical History:  Diagnosis Date   . Anxiety   . Arthritis   . BPH (benign prostatic hyperplasia) 11/19/2013  . 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 Bulging disc    C2/3, C3/4, C6/7  . Cervical disc herniation    C4/5 and C5/6  . Colon polyp 04/18/2011  . Constipation 08/31/2012  . Depression   . Diverticulosis   . 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 GERD (gastroesophageal reflux disease)   . H/O diverticulitis of colon   . Hemorrhoid   . Hiatal hernia   . Hyperlipidemia   . Levoscoliosis   . Sleep difficulties   . Spinal stenosis in cervical region    cord abutment C4/5  . Testicular pain, left   . Typical atrial flutter ([place]) 11/2015   a. CHADS2VASc => 0; b. on Eliquis for pending ablation     Tobacco History: Social History   Tobacco Use  Smoking Status Former Smoker  . Packs/day: 1.00  . Years: 20.00  . Pack years: 20.00  . Types: Cigarettes  . Quit date: 02/08/1991  . Years since quitting: 29.1  Smokeless Tobacco Never Used   Counseling given: Not Answered   Outpatient Medications Prior to Visit  Medication Sig Dispense Refill  . albuterol (VENTOLIN HFA) 108 (90 Base) MCG/ACT inhaler TAKE 2 PUFFS BY MOUTH EVERY 6 HOURS AS NEEDED FOR WHEEZE OR SHORTNESS OF BREATH 8.5 each 1  . ALPRAZolam (XANAX) 0.5 MG tablet TAKE 1 TABLET BY MOUTH EVERY NIGHT AT BEDTIME AS NEEDED FOR ANXIETY 20 tablet 0  . atorvastatin (LIPITOR) 20 MG tablet TAKE 1 TABLET BY MOUTH IN  THE MORNING 90 tablet 3  .  cyclobenzaprine (FLEXERIL) 10 MG tablet TAKE 1 TABLET BY MOUTH 3  TIMES DAILY AS NEEDED FOR  MUSCLE SPASMS 90 tablet 0  . escitalopram (LEXAPRO) 10 MG tablet TAKE 1 TABLET BY MOUTH  DAILY 90 tablet 3  . finasteride (PROSCAR) 5 MG tablet TAKE 1 TABLET BY MOUTH  DAILY 90 tablet 3  . loratadine (CLARITIN) 10 MG tablet Take 10 mg by mouth daily as needed for allergies.    [name] losartan (COZAAR) 50 MG tablet TAKE 1 TABLET BY MOUTH  DAILY 90 tablet 3  . meloxicam (MOBIC) 15 MG  tablet TAKE 1 TABLET BY MOUTH  DAILY AS NEEDED FOR PAIN 90 tablet 3  . metroNIDAZOLE (METROGEL) 1 % gel Apply topically daily. 60 g 0  . pantoprazole (PROTONIX) 40 MG tablet TAKE 1 TABLET BY MOUTH  DAILY 90 tablet 3  . Plecanatide (TRULANCE) 3 MG TABS Take 3 mg by mouth daily. 90 tablet 3  . tadalafil (CIALIS) 5 MG tablet TAKE 1 TABLET BY MOUTH  DAILY 90 tablet 2  . tadalafil (CIALIS) 5 MG tablet Take 1 tablet (5 mg total) by mouth daily as needed for erectile dysfunction. 90 tablet 0  . triamcinolone (NASACORT) 55 MCG/ACT AERO nasal inhaler Place 2 sprays into the nose daily as needed (allergies).      No facility-administered medications prior to visit.       Review of Systems:  Gen:  Denies  fever, sweats, chills weight loss  HEENT: Denies blurred vision, double vision, ear pain, eye pain, hearing loss, nose bleeds, sore throat Cardiac:  No dizziness, chest pain or heaviness, chest tightness,edema, No JVD Resp:   +cough, +sputum production, +shortness of breath,+wheezing, -hemoptysis,  Gi: +ACID REFLUX Gu:  Denies bladder incontinence, burning urine Ext:   Denies Joint pain, stiffness or swelling Skin: Denies  skin rash, easy bruising or bleeding or hives Endoc:  Denies polyuria, polydipsia , polyphagia or weight change Psych:   Denies depression, insomnia or hallucinations  Other:  All other systems negative  BP 132/78 (BP Location: Left Arm, Cuff Size: Normal)   Pulse 78   Temp 98 F (36.7 C) (Temporal)   Ht 5\' 10"  (1.778 m)   Wt 204 lb (92.5 kg)   SpO2 98%   BMI 29.27 kg/m   Physical Examination:   General Appearance: No distress  Neuro:without focal findings,  speech normal,  HEENT: PERRLA, EOM intact.   Pulmonary: normal breath sounds, No wheezing.  CardiovascularNormal S1,S2.  No m/r/g.   Abdomen: Benign, Soft, non-tender. Renal:  No costovertebral tenderness  GU:  Not performed at this time. Endoc: No evident thyromegaly Skin:   warm, no rashes, no  ecchymosis  Extremities: normal, no cyanosis, clubbing. PSYCHIATRIC: Mood, affect within normal limits.   ALL OTHER ROS ARE NEGATIVE       65 year old white male seen today for intermittent reactive airways disease shortness of breath cough intermittent wheezing most likely related to uncontrolled acid reflux from hiatal hernia in the setting of severe OSA   At this time his symptoms are definitely related to acid reflux which is uncontrolled Recommend follow-up with GI and possible referral to general surgery for evaluation of hiatal hernia  At this time albuterol inhaler does seem to help his symptoms and does not use it as frequently as he needs to Advised to use his albuterol as needed I also would recommend obtaining pulmonary function testing to assess lung function in the setting of previous smoking history    Severe  OSA AHI of 60 Patient has tried and failed CPAP therapy and oral device therapy multiple times Patient had been referred to inspire device for further therapy  Obesity -recommend significant weight loss -recommend changing diet  Deconditioned state -Recommend increased daily activity and exercise    MEDICATION ADJUSTMENTS/LABS AND TESTS ORDERED: Recommend pulmonary function testing to assess for lung function Continue albuterol as needed Recommend possible referral to general surgery to assess for hiatal hernia repair Follow-up with GI for uncontrolled reflux   CURRENT MEDICATIONS REVIEWED AT LENGTH WITH PATIENT TODAY   Patient satisfied with Plan of action and management. All questions answered  Follow-up 6 months  Time spent 22 mins  Kenzey Birkland Patricia Pesa, M.D.  [facility]  Medical Director [place] Director [facility] Cardio-Pulmonary Department

## 2020-04-01 NOTE — Patient Instructions (Signed)
Recommend pulmonary function testing to assess for lung function Continue albuterol as needed Recommend possible referral to general surgery to assess for hiatal hernia repair Follow-up with GI for uncontrolled reflux

## 2020-04-05 ENCOUNTER — Other Ambulatory Visit: Payer: Self-pay | Admitting: Family Medicine

## 2020-04-05 DIAGNOSIS — G8929 Other chronic pain: Secondary | ICD-10-CM

## 2020-04-05 DIAGNOSIS — M5442 Lumbago with sciatica, left side: Secondary | ICD-10-CM

## 2020-04-05 DIAGNOSIS — E782 Mixed hyperlipidemia: Secondary | ICD-10-CM

## 2020-04-20 ENCOUNTER — Ambulatory Visit (INDEPENDENT_AMBULATORY_CARE_PROVIDER_SITE_OTHER): Payer: Medicare Other

## 2020-04-20 VITALS — Ht 70.0 in | Wt 204.0 lb

## 2020-04-20 DIAGNOSIS — Z0000 Encounter for general adult medical examination without abnormal findings: Secondary | ICD-10-CM | POA: Diagnosis not present

## 2020-04-20 NOTE — Progress Notes (Signed)
Subjective:   Tyler Mitton. is a 65 y.o. male who presents for Medicare Annual/Subsequent preventive examination.  Review of Systems    No ROS.  Medicare Wellness Virtual Visit.  Cardiac Risk Factors include: advanced age (>24men, >24 women);male gender;hypertension     Objective:    Today's Vitals   04/20/20 1501  Weight: 204 lb (92.5 kg)  Height: 5\' 10"  (1.778 m)   Body mass index is 29.27 kg/m.  Advanced Directives 04/20/2020 08/16/2016 06/30/2016 03/07/2016 02/17/2016 11/29/2015 11/29/2015  Does Patient Have a Medical Advance Directive? No Yes Yes Yes Yes No No  Type of Advance Directive - [place];Living will [place];Living will - [place];Living will - -  Does patient want to make changes to medical advance directive? - No - Patient declined No - Patient declined - No - Patient declined - -  Copy of [place] in Chart? - No - copy requested No - copy requested No - copy requested No - copy requested - -  Would patient like information on creating a medical advance directive? No - Patient declined - - - - Yes - Scientist, clinical (histocompatibility and immunogenetics) given Yes - Scientist, clinical (histocompatibility and immunogenetics) given    Current Medications (verified) Outpatient Encounter Medications as of 04/20/2020  Medication Sig  . albuterol (VENTOLIN HFA) 108 (90 Base) MCG/ACT inhaler TAKE 2 PUFFS BY MOUTH EVERY 6 HOURS AS NEEDED FOR WHEEZE OR SHORTNESS OF BREATH  . ALPRAZolam (XANAX) 0.5 MG tablet TAKE 1 TABLET BY MOUTH EVERY NIGHT AT BEDTIME AS NEEDED FOR ANXIETY  . atorvastatin (LIPITOR) 20 MG tablet TAKE 1 TABLET BY MOUTH IN  THE MORNING  . cyclobenzaprine (FLEXERIL) 10 MG tablet TAKE 1 TABLET BY MOUTH 3  TIMES DAILY AS NEEDED FOR  MUSCLE SPASMS  . escitalopram (LEXAPRO) 10 MG tablet TAKE 1 TABLET BY MOUTH  DAILY  . finasteride (PROSCAR) 5 MG tablet TAKE 1 TABLET BY MOUTH  DAILY  . loratadine (CLARITIN) 10 MG tablet Take 10 mg by mouth daily as needed  for allergies.  [name] losartan (COZAAR) 50 MG tablet TAKE 1 TABLET BY MOUTH  DAILY  . meloxicam (MOBIC) 15 MG tablet TAKE 1 TABLET BY MOUTH  DAILY AS NEEDED FOR PAIN  . metroNIDAZOLE (METROGEL) 1 % gel Apply topically daily.  . pantoprazole (PROTONIX) 40 MG tablet TAKE 1 TABLET BY MOUTH  DAILY  . Plecanatide (TRULANCE) 3 MG TABS Take 3 mg by mouth daily.  . tadalafil (CIALIS) 5 MG tablet TAKE 1 TABLET BY MOUTH  DAILY  . triamcinolone (NASACORT) 55 MCG/ACT AERO nasal inhaler Place 2 sprays into the nose daily as needed (allergies).    No facility-administered encounter medications on file as of 04/20/2020.    Allergies (verified) Other   History: Past Medical History:  Diagnosis Date  . Anxiety   . Arthritis   . BPH (benign prostatic hyperplasia) 11/19/2013  . 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 Bulging disc    C2/3, C3/4, C6/7  . Cervical disc herniation    C4/5 and C5/6  . Colon polyp 04/18/2011  . Constipation 08/31/2012  . Depression   . Diverticulosis   . 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 GERD (gastroesophageal reflux disease)   . H/O diverticulitis of colon   . Hemorrhoid   . Hiatal hernia   . Hyperlipidemia   . Levoscoliosis   . Sleep difficulties   .  Spinal stenosis in cervical region    cord abutment C4/5  . Testicular pain, left   . Typical atrial flutter ([place]) 11/2015   a. CHADS2VASc => 0; b. on Eliquis for pending ablation    Past Surgical History:  Procedure Laterality Date  . ATRIAL FLUTTER ABLATION  02/17/2016  . COLONOSCOPY  2014  . COLONOSCOPY WITH PROPOFOL N/A 06/30/2016   Procedure: COLONOSCOPY WITH PROPOFOL;  Surgeon: Lucilla Lame, MD;  Location: [facility];  Service: Endoscopy;  Laterality: N/A;  . ELECTROPHYSIOLOGIC STUDY N/A 01/04/2016   Procedure: Cardioversion;  Surgeon: Wellington Hampshire, MD;  Location: [facility];  Service: Cardiovascular;  Laterality: N/A;  . ELECTROPHYSIOLOGIC STUDY N/A  02/17/2016   Procedure: A-Flutter Ablation;  Surgeon: Deboraha Sprang, MD;  Location: [facility];  Service: Cardiovascular;  Laterality: N/A;  . GANGLION CYST EXCISION Right 1994   wrist & back  . HERNIA REPAIR Right 1994   abdominal repair with mesh  . NASAL SEPTUM SURGERY  2011   Dr. [name]    Family History  Problem Relation Age of Onset  . Hyperlipidemia Mother   . Cancer Mother        skin cancer?  . Heart disease Mother 30       MI - died in her sleep  . Hyperlipidemia Father   . Heart disease Father   . Kidney disease Neg Hx   . Prostate cancer Neg Hx   . Kidney cancer Neg Hx   . Bladder Cancer Neg Hx    Social History   Socioeconomic History  . Marital status: Married    Spouse name: [name]  . Number of children: 1  . Years of education: GED  . Highest education level: Not on file  Occupational History  . Occupation: Disability    Comment: Herniated disk, spinal stenosis  Tobacco Use  . Smoking status: Former Smoker    Packs/day: 1.00    Years: 20.00    Pack years: 20.00    Types: Cigarettes    Quit date: 02/08/1991    Years since quitting: 29.2  . Smokeless tobacco: Never Used  Substance and Sexual Activity  . Alcohol use: No    Alcohol/week: 0.0 standard drinks  . Drug use: No  . Sexual activity: Yes    Partners: Female  Other Topics Concern  . Not on file  Social History Narrative   Ellwyn was born and raised in [place], [place]. He quit school after the 8th grade because of financial hardship. He was working since age 34. He went back and got his GED. He moved to [place] in 2006. He lives at home with his wife of 75 years and their 66 year old daughter. Previously, Vickie was married to his first wife for 20 years and they divorced. They had no children.  Cordie has been disabled 2/2 back problems.     Social Determinants of Health   Financial Resource Strain: Low Risk   . Difficulty of Paying Living Expenses: Not hard at all  Food Insecurity:  No Food Insecurity  . Worried About Charity fundraiser in the Last Year: Never true  . Ran Out of Food in the Last Year: Never true  Transportation Needs: No Transportation Needs  . Lack of Transportation (Medical): No  . Lack of Transportation (Non-Medical): No  Physical Activity: Not on file  Stress: No Stress Concern Present  . Feeling of Stress : Not at all  Social Connections: Unknown  .  Frequency of Communication with Friends and Family: More than three times a week  . Frequency of Social Gatherings with Friends and Family: Not on file  . Attends Religious Services: Not on file  . Active Member of Clubs or Organizations: Not on file  . Attends Archivist Meetings: Not on file  . Marital Status: Married    Tobacco Counseling Counseling given: Not Answered   Clinical Intake:  Pre-visit preparation completed: Yes        Diabetes: No  How often do you need to have someone help you when you read instructions, pamphlets, or other written materials from your doctor or pharmacy?: 1 - Never   Interpreter Needed?: No      Activities of Daily Living In your present state of health, do you have any difficulty performing the following activities: 04/20/2020  Hearing? N  Vision? N  Difficulty concentrating or making decisions? N  Walking or climbing stairs? N  Comment Paces himself.  Dressing or bathing? N  Doing errands, shopping? N  Preparing Food and eating ? N  Using the Toilet? N  In the past six months, have you accidently leaked urine? N  Do you have problems with loss of bowel control? N  Managing your Medications? N  Managing your Finances? N  Housekeeping or managing your Housekeeping? N  Some recent data might be hidden    Patient Care Team: [name], MD as PCP - General (Family Medicine) Bary Castilla, Forest Gleason, MD (General Surgery) [name], MD as Consulting Physician (Family Medicine) Bary Castilla Forest Gleason, MD (General  Surgery)  Indicate any recent Medical Services you may have received from other than [facility] providers in the past year (date may be approximate).     Assessment:   This is a routine wellness examination for [name].  I connected with [name] today by telephone and verified that I am speaking with the correct person using two identifiers. Location patient: home Location provider: work Persons participating in the virtual visit: patient, Marine scientist.    I discussed the limitations, risks, security and privacy concerns of performing an evaluation and management service by telephone and the availability of in person appointments. The patient expressed understanding and verbally consented to this telephonic visit.    Interactive audio and video telecommunications were attempted between this provider and patient, however failed, due to patient having technical difficulties OR patient did not have access to video capability.  We continued and completed visit with audio only.  Some vital signs may be absent or patient reported.   Hearing/Vision screen  Hearing Screening   125Hz  250Hz  500Hz  1000Hz  2000Hz  3000Hz  4000Hz  6000Hz  8000Hz   Right ear:           Left ear:           Comments: Patient is able to hear conversational tones without difficulty.  No issues reported.  Vision Screening Comments: Wears corrective lenses Visual acuity not assessed, virtual visit.  They have seen their ophthalmologist in the last 12 months.    Dietary issues and exercise activities discussed: Current Exercise Habits: The patient does not participate in regular exercise at present  Healthy diet Good water intake  Goals    . Maintain Healthy Lifestyle     Healthy diet Stay hydrated Stay active, stretch      Depression Screen PHQ 2/9 Scores 04/20/2020 03/04/2020 05/17/2019 01/15/2019 09/12/2018 01/16/2018 01/16/2018  PHQ - 2 Score 0 0 0 0 0 6 6  PHQ- 9 Score - - - - -  65 -  Exception Documentation - - - - - - -    Fall  Risk Fall Risk  04/20/2020 03/04/2020 05/17/2019 01/15/2019 09/12/2018  Falls in the past year? 0 0 0 0 0  Number falls in past yr: 0 0 0 0 0  Injury with Fall? 0 0 - 0 -  Follow up Falls evaluation completed Falls evaluation completed Falls evaluation completed Falls evaluation completed -    FALL RISK PREVENTION PERTAINING TO THE HOME: Handrails in use when climbing stairs? Yes Home free of loose throw rugs in walkways, pet beds, electrical cords, etc? Yes  Adequate lighting in your home to reduce risk of falls? Yes   ASSISTIVE DEVICES UTILIZED TO PREVENT FALLS: Use of a cane, walker or w/c? No   TIMED UP AND GO: Was the test performed? No . Virtual visit.   Cognitive Function: Patient is alert and oriented x3.  Denies difficulty focusing, making decisions, memory loss.  Enjoys MMSE/6CIT deferred. Normal by direct communication/observation.   MMSE - Mini Mental State Exam 08/16/2016  Orientation to time 5  Orientation to Place 5  Registration 3  Attention/ Calculation 5  Recall 3  Language- name 2 objects 2  Language- repeat 1  Language- follow 3 step command 3  Language- read & follow direction 1  Write a sentence 1  Copy design 1  Total score 30       Immunizations Immunization History  Administered Date(s) Administered  . Influenza Whole 11/18/2010  . Influenza,inj,Quad PF,6+ Mos 10/17/2012, 11/02/2013, 10/16/2014, 10/12/2016, 10/17/2017, 10/17/2018, 10/17/2019  . Influenza-Unspecified 11/02/2011, 10/17/2012, 11/02/2013, 10/22/2015, 10/17/2019  . PFIZER(Purple Top)SARS-COV-2 Vaccination 04/27/2019, 05/19/2019, 01/15/2020  . Tdap 10/17/2012    Health Maintenance Health Maintenance  Topic Date Due  . HIV Screening  Never done  . TETANUS/TDAP  10/18/2022  . COLONOSCOPY (Pts 45-33yrs Insurance coverage will need to be confirmed)  07/01/2026  . INFLUENZA VACCINE  Completed  . COVID-19 Vaccine  Completed  . Hepatitis C Screening  Completed  . HPV VACCINES  Aged Out    Colorectal cancer screening: Type of screening: Colonoscopy. Completed 06/30/16. Repeat every 10 years  DG Chest 2 View: completed 06/04/19.   Vision Screening: Recommended annual ophthalmology exams for early detection of glaucoma and other disorders of the eye. Is the patient up to date with their annual eye exam?  Yes  Who is the provider or what is the name of the office in which the patient attends annual eye exams? [facility].  Dental Screening: Recommended annual dental exams for proper oral hygiene. Visits every 3-6 months.   Community Resource Referral / Chronic Care Management: CRR required this visit?  No   CCM required this visit?  No      Plan:   Keep all routine maintenance appointments.   Follow up 06/03/20 @ 8:00; fasting for labs.   I have personally reviewed and noted the following in the patient's chart:   . Medical and social history . Use of alcohol, tobacco or illicit drugs  . Current medications and supplements . Functional ability and status . Nutritional status . Physical activity . Advanced directives . List of other physicians . Hospitalizations, surgeries, and ER visits in previous 12 months . Vitals . Screenings to include cognitive, depression, and falls . Referrals and appointments  In addition, I have reviewed and discussed with patient certain preventive protocols, quality metrics, and best practice recommendations. A written personalized care plan for preventive services as well as general  preventive health recommendations were provided to patient via mychart.     Varney Biles, LPN   3/70/0525

## 2020-04-20 NOTE — Patient Instructions (Addendum)
[name] , Thank you for taking time to come for your Medicare Wellness Visit. I appreciate your ongoing commitment to your health goals. Please review the following plan we discussed and let me know if I can assist you in the future.   These are the goals we discussed: Goals    . Maintain Healthy Lifestyle     Healthy diet Stay hydrated Stay active, stretch       This is a list of the screening recommended for you and due dates:  Health Maintenance  Topic Date Due  . HIV Screening  Never done  . Tetanus Vaccine  10/18/2022  . Colon Cancer Screening  07/01/2026  . Flu Shot  Completed  . COVID-19 Vaccine  Completed  .  Hepatitis C: One time screening is recommended by Center for Disease Control  (CDC) for  adults born from [birth year] through 1964.   Completed  . HPV Vaccine  Aged Out   Keep all routine maintenance appointments.   Follow up 06/03/20 @ 8:00; fasting for labs.  Advanced directives: not yet completed.   Conditions/risks identified: none new.   Follow up in one year for your annual wellness visit.   Preventive Care 40-64 Years, Male Preventive care refers to lifestyle choices and visits with your health care provider that can promote health and wellness. What does preventive care include?  A yearly physical exam. This is also called an annual well check.  Dental exams once or twice a year.  Routine eye exams. Ask your health care provider how often you should have your eyes checked.  Personal lifestyle choices, including:  Daily care of your teeth and gums.  Regular physical activity.  Eating a healthy diet.  Avoiding tobacco and drug use.  Limiting alcohol use.  Practicing safe sex.  Taking low-dose aspirin every day starting at age 34. What happens during an annual well check? The services and screenings done by your health care provider during your annual well check will depend on your age, overall health, lifestyle risk factors, and family history  of disease. Counseling  Your health care provider may ask you questions about your:  Alcohol use.  Tobacco use.  Drug use.  Emotional well-being.  Home and relationship well-being.  Sexual activity.  Eating habits.  Work and work Statistician. Screening  You may have the following tests or measurements:  Height, weight, and BMI.  Blood pressure.  Lipid and cholesterol levels. These may be checked every 5 years, or more frequently if you are over 47 years old.  Skin check.  Lung cancer screening. You may have this screening every year starting at age 61 if you have a 30-pack-year history of smoking and currently smoke or have quit within the past 15 years.  Fecal occult blood test (FOBT) of the stool. You may have this test every year starting at age 58.  Flexible sigmoidoscopy or colonoscopy. You may have a sigmoidoscopy every 5 years or a colonoscopy every 10 years starting at age 20.  Prostate cancer screening. Recommendations will vary depending on your family history and other risks.  Hepatitis C blood test.  Hepatitis B blood test.  Sexually transmitted disease (STD) testing.  Diabetes screening. This is done by checking your blood sugar (glucose) after you have not eaten for a while (fasting). You may have this done every 1-3 years. Discuss your test results, treatment options, and if necessary, the need for more tests with your health care provider. Vaccines  Your health  care provider may recommend certain vaccines, such as:  Influenza vaccine. This is recommended every year.  Tetanus, diphtheria, and acellular pertussis (Tdap, Td) vaccine. You may need a Td booster every 10 years.  Zoster vaccine. You may need this after age 16.  Pneumococcal 13-valent conjugate (PCV13) vaccine. You may need this if you have certain conditions and have not been vaccinated.  Pneumococcal polysaccharide (PPSV23) vaccine. You may need one or two doses if you smoke  cigarettes or if you have certain conditions. Talk to your health care provider about which screenings and vaccines you need and how often you need them. This information is not intended to replace advice given to you by your health care provider. Make sure you discuss any questions you have with your health care provider. Document Released: 02/20/2015 Document Revised: 10/14/2015 Document Reviewed: 11/25/2014 Elsevier Interactive Patient Education  2017 [place] Prevention in the Home Falls can cause injuries. They can happen to people of all ages. There are many things you can do to make your home safe and to help prevent falls. What can I do on the outside of my home?  Regularly fix the edges of walkways and driveways and fix any cracks.  Remove anything that might make you trip as you walk through a door, such as a raised step or threshold.  Trim any bushes or trees on the path to your home.  Use bright outdoor lighting.  Clear any walking paths of anything that might make someone trip, such as rocks or tools.  Regularly check to see if handrails are loose or broken. Make sure that both sides of any steps have handrails.  Any raised decks and porches should have guardrails on the edges.  Have any leaves, snow, or ice cleared regularly.  Use sand or salt on walking paths during winter.  Clean up any spills in your garage right away. This includes oil or grease spills. What can I do in the bathroom?  Use night lights.  Install grab bars by the toilet and in the tub and shower. Do not use towel bars as grab bars.  Use non-skid mats or decals in the tub or shower.  If you need to sit down in the shower, use a plastic, non-slip stool.  Keep the floor dry. Clean up any water that spills on the floor as soon as it happens.  Remove soap buildup in the tub or shower regularly.  Attach bath mats securely with double-sided non-slip rug tape.  Do not have throw rugs  and other things on the floor that can make you trip. What can I do in the bedroom?  Use night lights.  Make sure that you have a light by your bed that is easy to reach.  Do not use any sheets or blankets that are too big for your bed. They should not hang down onto the floor.  Have a firm chair that has side arms. You can use this for support while you get dressed.  Do not have throw rugs and other things on the floor that can make you trip. What can I do in the kitchen?  Clean up any spills right away.  Avoid walking on wet floors.  Keep items that you use a lot in easy-to-reach places.  If you need to reach something above you, use a strong step stool that has a grab bar.  Keep electrical cords out of the way.  Do not use floor polish or wax  that makes floors slippery. If you must use wax, use non-skid floor wax.  Do not have throw rugs and other things on the floor that can make you trip. What can I do with my stairs?  Do not leave any items on the stairs.  Make sure that there are handrails on both sides of the stairs and use them. Fix handrails that are broken or loose. Make sure that handrails are as long as the stairways.  Check any carpeting to make sure that it is firmly attached to the stairs. Fix any carpet that is loose or worn.  Avoid having throw rugs at the top or bottom of the stairs. If you do have throw rugs, attach them to the floor with carpet tape.  Make sure that you have a light switch at the top of the stairs and the bottom of the stairs. If you do not have them, ask someone to add them for you. What else can I do to help prevent falls?  Wear shoes that:  Do not have high heels.  Have rubber bottoms.  Are comfortable and fit you well.  Are closed at the toe. Do not wear sandals.  If you use a stepladder:  Make sure that it is fully opened. Do not climb a closed stepladder.  Make sure that both sides of the stepladder are locked into  place.  Ask someone to hold it for you, if possible.  Clearly mark and make sure that you can see:  Any grab bars or handrails.  First and last steps.  Where the edge of each step is.  Use tools that help you move around (mobility aids) if they are needed. These include:  Canes.  Walkers.  Scooters.  Crutches.  Turn on the lights when you go into a dark area. Replace any light bulbs as soon as they burn out.  Set up your furniture so you have a clear path. Avoid moving your furniture around.  If any of your floors are uneven, fix them.  If there are any pets around you, be aware of where they are.  Review your medicines with your doctor. Some medicines can make you feel dizzy. This can increase your chance of falling. Ask your doctor what other things that you can do to help prevent falls. This information is not intended to replace advice given to you by your health care provider. Make sure you discuss any questions you have with your health care provider. Document Released: 11/20/2008 Document Revised: 07/02/2015 Document Reviewed: 02/28/2014 Elsevier Interactive Patient Education  2017 [employer].

## 2020-05-03 ENCOUNTER — Other Ambulatory Visit: Payer: Self-pay | Admitting: Family Medicine

## 2020-05-03 DIAGNOSIS — K219 Gastro-esophageal reflux disease without esophagitis: Secondary | ICD-10-CM

## 2020-05-14 ENCOUNTER — Other Ambulatory Visit: Payer: Self-pay

## 2020-05-14 DIAGNOSIS — R053 Chronic cough: Secondary | ICD-10-CM

## 2020-05-14 MED ORDER — ALBUTEROL SULFATE HFA 108 (90 BASE) MCG/ACT IN AERS: INHALATION_SPRAY | RESPIRATORY_TRACT | 1 refills | Status: DC

## 2020-05-15 ENCOUNTER — Telehealth: Payer: Self-pay

## 2020-05-15 MED ORDER — ALBUTEROL SULFATE HFA 108 (90 BASE) MCG/ACT IN AERS: 2.0000 | INHALATION_SPRAY | Freq: Four times a day (QID) | RESPIRATORY_TRACT | 0 refills | Status: DC | PRN

## 2020-05-15 NOTE — Telephone Encounter (Signed)
New inhaler sent to [facility].

## 2020-05-25 ENCOUNTER — Other Ambulatory Visit: Payer: Self-pay | Admitting: Family Medicine

## 2020-05-25 DIAGNOSIS — N138 Other obstructive and reflux uropathy: Secondary | ICD-10-CM

## 2020-05-25 DIAGNOSIS — N529 Male erectile dysfunction, unspecified: Secondary | ICD-10-CM

## 2020-05-25 MED ORDER — TADALAFIL 5 MG PO TABS: ORAL_TABLET | ORAL | 2 refills | Status: DC

## 2020-05-28 ENCOUNTER — Other Ambulatory Visit: Payer: Self-pay

## 2020-06-03 ENCOUNTER — Other Ambulatory Visit: Payer: Self-pay

## 2020-06-03 ENCOUNTER — Ambulatory Visit
Admission: RE | Admit: 2020-06-03 | Discharge: 2020-06-03 | Disposition: A | Discharge status: Home / Self Care | Payer: Medicare Other | Source: Ambulatory Visit | Attending: Family Medicine | Admitting: Family Medicine

## 2020-06-03 ENCOUNTER — Telehealth (INDEPENDENT_AMBULATORY_CARE_PROVIDER_SITE_OTHER): Payer: Medicare Other | Admitting: Family Medicine

## 2020-06-03 VITALS — BP 134/72 | Wt 202.0 lb

## 2020-06-03 DIAGNOSIS — R059 Cough, unspecified: Secondary | ICD-10-CM

## 2020-06-03 DIAGNOSIS — G4733 Obstructive sleep apnea (adult) (pediatric): Secondary | ICD-10-CM

## 2020-06-03 DIAGNOSIS — N503 Cyst of epididymis: Secondary | ICD-10-CM

## 2020-06-03 DIAGNOSIS — F4323 Adjustment disorder with mixed anxiety and depressed mood: Secondary | ICD-10-CM

## 2020-06-03 DIAGNOSIS — L729 Follicular cyst of the skin and subcutaneous tissue, unspecified: Secondary | ICD-10-CM | POA: Diagnosis not present

## 2020-06-03 DIAGNOSIS — E785 Hyperlipidemia, unspecified: Secondary | ICD-10-CM | POA: Diagnosis not present

## 2020-06-03 DIAGNOSIS — I10 Essential (primary) hypertension: Secondary | ICD-10-CM | POA: Diagnosis not present

## 2020-06-03 MED ORDER — ALPRAZOLAM 0.5 MG PO TABS: ORAL_TABLET | ORAL | 0 refills | Status: DC

## 2020-06-03 NOTE — Assessment & Plan Note (Signed)
He will continue to see urology. 

## 2020-06-03 NOTE — Progress Notes (Signed)
Virtual Visit via video Note  This visit type was conducted due to national recommendations for restrictions regarding the COVID-19 pandemic (e.g. social distancing).  This format is felt to be most appropriate for this patient at this time.  All issues noted in this document were discussed and addressed.  No physical exam was performed (except for noted visual exam findings with Video Visits).   I connected with [name] today at  8:00 AM EDT by a video enabled telemedicine application or telephone and verified that I am speaking with the correct person using two identifiers. Location patient: home Location provider: work Persons participating in the virtual visit: patient, provider  I discussed the limitations, risks, security and privacy concerns of performing an evaluation and management service by telephone and the availability of in person appointments. I also discussed with the patient that there may be a patient responsible charge related to this service. The patient expressed understanding and agreed to proceed.  Reason for visit: f/u  HPI: HYPERTENSION  Disease Monitoring  Home BP Monitoring notes generally well controlled Chest pain- no    Dyspnea- see below Medications  Compliance-  Taking losartan.   Edema- no  Anxiety/depression: Patient notes these things are okay.  They are manageable.  He notes no SI.  He continues on Lexapro.  He occasionally takes Xanax.  OSA: Patient was unable to tolerate his CPAP.  He could not tolerate a mouth device.  He declined the inspire device as well.  Cough: Patient has had cough and congestion for the past 3 weeks.  He was seen about a week ago at an urgent care and prescribed Augmentin.  He notes no benefit from this.  He continues to cough and have congestion in his head and chest.  He has had no fevers.  He has had mild dyspnea if he gets up and moves around too much.  No chest pain.  He has had some small specks of blood in his mucus  when he coughs up.  He has sore throat that is red with some postnasal drip as well.  He reports a negative home COVID test and a negative flu test.  Cyst: Patient notes a cyst in his anterior neck above the level of the thyroid.  Notes this been present for months.  Has not grown.  There is no pain.  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  They are unchanged.     ROS: See pertinent positives and negatives per HPI.  Past Medical History:  Diagnosis Date  . Anxiety   . Arthritis   . BPH (benign prostatic hyperplasia) 11/19/2013  . 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 Bulging disc    C2/3, C3/4, C6/7  . Cervical disc herniation    C4/5 and C5/6  . Colon polyp 04/18/2011  . Constipation 08/31/2012  . Depression   . Diverticulosis   . 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 GERD (gastroesophageal reflux disease)   . H/O diverticulitis of colon   . Hemorrhoid   . Hiatal hernia   . Hyperlipidemia   . Levoscoliosis   . Sleep difficulties   . Spinal stenosis in cervical region    cord abutment C4/5  . Testicular pain, left   . Typical atrial flutter ([place]) 11/2015   a. CHADS2VASc => 0; b. on Eliquis for pending ablation     Past Surgical History:  Procedure Laterality Date  .  ATRIAL FLUTTER ABLATION  02/17/2016  . COLONOSCOPY  2014  . COLONOSCOPY WITH PROPOFOL N/A 06/30/2016   Procedure: COLONOSCOPY WITH PROPOFOL;  Surgeon: Lucilla Lame, MD;  Location: [place] [facility];  Service: Endoscopy;  Laterality: N/A;  . ELECTROPHYSIOLOGIC STUDY N/A 01/04/2016   Procedure: Cardioversion;  Surgeon: Wellington Hampshire, MD;  Location: [facility];  Service: Cardiovascular;  Laterality: N/A;  . ELECTROPHYSIOLOGIC STUDY N/A 02/17/2016   Procedure: A-Flutter Ablation;  Surgeon: Deboraha Sprang, MD;  Location: [facility];  Service: Cardiovascular;  Laterality: N/A;  . GANGLION CYST EXCISION Right 1994   wrist & back  . HERNIA  REPAIR Right 1994   abdominal repair with mesh  . NASAL SEPTUM SURGERY  2011   Dr. [name]     Family History  Problem Relation Age of Onset  . Hyperlipidemia Mother   . Cancer Mother        skin cancer?  . Heart disease Mother 32       MI - died in her sleep  . Hyperlipidemia Father   . Heart disease Father   . Kidney disease Neg Hx   . Prostate cancer Neg Hx   . Kidney cancer Neg Hx   . Bladder Cancer Neg Hx     SOCIAL HX: Former smoker   Current Outpatient Medications:  .  albuterol (VENTOLIN HFA) 108 (90 Base) MCG/ACT inhaler, Inhale 2 puffs into the lungs every 6 (six) hours as needed for wheezing or shortness of breath., Disp: 18 g, Rfl: 0 .  atorvastatin (LIPITOR) 20 MG tablet, TAKE 1 TABLET BY MOUTH IN  THE MORNING, Disp: 90 tablet, Rfl: 3 .  cyclobenzaprine (FLEXERIL) 10 MG tablet, TAKE 1 TABLET BY MOUTH 3  TIMES DAILY AS NEEDED FOR  MUSCLE SPASMS, Disp: 90 tablet, Rfl: 0 .  escitalopram (LEXAPRO) 10 MG tablet, TAKE 1 TABLET BY MOUTH  DAILY, Disp: 90 tablet, Rfl: 3 .  finasteride (PROSCAR) 5 MG tablet, TAKE 1 TABLET BY MOUTH  DAILY, Disp: 90 tablet, Rfl: 3 .  loratadine (CLARITIN) 10 MG tablet, Take 10 mg by mouth daily as needed for allergies., Disp: , Rfl:  .  losartan (COZAAR) 50 MG tablet, TAKE 1 TABLET BY MOUTH  DAILY, Disp: 90 tablet, Rfl: 3 .  meloxicam (MOBIC) 15 MG tablet, TAKE 1 TABLET BY MOUTH  DAILY AS NEEDED FOR PAIN, Disp: 90 tablet, Rfl: 3 .  pantoprazole (PROTONIX) 40 MG tablet, TAKE 1 TABLET BY MOUTH  DAILY, Disp: 90 tablet, Rfl: 3 .  Plecanatide (TRULANCE) 3 MG TABS, Take 3 mg by mouth daily., Disp: 90 tablet, Rfl: 3 .  tadalafil (CIALIS) 5 MG tablet, TAKE 1 TABLET BY MOUTH  DAILY, Disp: 90 tablet, Rfl: 2 .  triamcinolone (NASACORT) 55 MCG/ACT AERO nasal inhaler, Place 2 sprays into the nose daily as needed (allergies). , Disp: , Rfl:  .  ALPRAZolam (XANAX) 0.5 MG tablet, TAKE 1 TABLET BY MOUTH EVERY NIGHT AT BEDTIME AS NEEDED FOR ANXIETY, Disp: 20  tablet, Rfl: 0 .  metroNIDAZOLE (METROGEL) 1 % gel, Apply topically daily. (Patient not taking: Reported on 06/03/2020), Disp: 60 g, Rfl: 0  EXAM:  VITALS per patient if applicable:  GENERAL: alert, oriented, appears well and in no acute distress  HEENT: atraumatic, conjunttiva clear, no obvious abnormalities on inspection of external nose and ears  NECK: normal movements of the head and neck  LUNGS: on inspection no signs of respiratory distress, breathing rate appears normal, no obvious gross SOB, gasping  or wheezing  CV: no obvious cyanosis  MS: moves all visible extremities without noticeable abnormality  PSYCH/NEURO: pleasant and cooperative, no obvious depression or anxiety, speech and thought processing grossly intact  ASSESSMENT AND PLAN:  Discussed the following assessment and plan:  Problem List Items Addressed This Visit    Hyperlipidemia   Relevant Orders   Lipid panel   Adjustment disorder with mixed anxiety and depressed mood - Primary    Stable.  He will continue Lexapro 10 mg once daily.  He can continue as needed Xanax.  Refill sent to pharmacy.      Relevant Medications   ALPRAZolam (XANAX) 0.5 MG tablet   Hypertension    Reportedly generally adequately controlled.  He will continue losartan 50 mg once daily.  We will have him in for labs in about a month.      Relevant Orders   Comp Met (CMET)   Lipid panel   Sleep apnea    Intolerant of CPAP and oral device.  He declined an inspire device.  He will monitor.      Cough    Concern for sinusitis versus bronchitis versus pneumonia.  Given the specks of blood in his mucus from his cough we will get a chest x-ray.  I will let this guide management with choice of antibiotic.  He will go to have this done today.      Relevant Orders   DG Chest 2 View   Skin cyst    Likely skin cyst anterior neck though I am unable to complete a hands-on exam as this is a virtual visit so we will get an ultrasound to  evaluate this further.      Relevant Orders   US Soft Tissue Head/Neck (NON-THYROID)   Epididymal cyst    He will continue to see urology.          I discussed the assessment and treatment plan with the patient. The patient was provided an opportunity to ask questions and all were answered. The patient agreed with the plan and demonstrated an understanding of the instructions.   The patient was advised to call back or seek an in-person evaluation if the symptoms worsen or if the condition fails to improve as anticipated.  [name], MD

## 2020-06-03 NOTE — Assessment & Plan Note (Signed)
Likely skin cyst anterior neck though I am unable to complete a hands-on exam as this is a virtual visit so we will get an ultrasound to evaluate this further.

## 2020-06-03 NOTE — Assessment & Plan Note (Signed)
Concern for sinusitis versus bronchitis versus pneumonia.  Given the specks of blood in his mucus from his cough we will get a chest x-ray.  I will let this guide management with choice of antibiotic.  He will go to have this done today.

## 2020-06-03 NOTE — Assessment & Plan Note (Signed)
Intolerant of CPAP and oral device.  He declined an inspire device.  He will monitor.

## 2020-06-03 NOTE — Assessment & Plan Note (Signed)
Reportedly generally adequately controlled.  He will continue losartan 50 mg once daily.  We will have him in for labs in about a month.

## 2020-06-03 NOTE — Assessment & Plan Note (Signed)
Stable.  He will continue Lexapro 10 mg once daily.  He can continue as needed Xanax.  Refill sent to pharmacy.

## 2020-06-04 ENCOUNTER — Other Ambulatory Visit: Payer: Self-pay | Admitting: Family Medicine

## 2020-06-04 MED ORDER — DOXYCYCLINE HYCLATE 100 MG PO TABS: 100.0000 mg | ORAL_TABLET | Freq: Two times a day (BID) | ORAL | 0 refills | Status: DC

## 2020-06-10 ENCOUNTER — Ambulatory Visit: Payer: Medicare Other | Admitting: Family Medicine

## 2020-06-12 ENCOUNTER — Other Ambulatory Visit: Payer: Self-pay | Admitting: Family Medicine

## 2020-06-15 ENCOUNTER — Encounter: Payer: Self-pay | Admitting: Family Medicine

## 2020-06-15 ENCOUNTER — Other Ambulatory Visit: Payer: Self-pay

## 2020-06-15 ENCOUNTER — Ambulatory Visit (INDEPENDENT_AMBULATORY_CARE_PROVIDER_SITE_OTHER): Payer: Medicare Other | Admitting: Family Medicine

## 2020-06-15 DIAGNOSIS — R059 Cough, unspecified: Secondary | ICD-10-CM

## 2020-06-15 DIAGNOSIS — K219 Gastro-esophageal reflux disease without esophagitis: Secondary | ICD-10-CM | POA: Diagnosis not present

## 2020-06-15 NOTE — Assessment & Plan Note (Signed)
Patient with likely infectious bronchitis previously.  This is resolved with antibiotics.  He will monitor for any future infectious symptoms.

## 2020-06-15 NOTE — Progress Notes (Signed)
Virtual Visit via video Note  This visit type was conducted due to national recommendations for restrictions regarding the COVID-19 pandemic (e.g. social distancing).  This format is felt to be most appropriate for this patient at this time.  All issues noted in this document were discussed and addressed.  No physical exam was performed (except for noted visual exam findings with Video Visits).   I connected with [name] today at  2:45 PM EDT by a video enabled telemedicine application and verified that I am speaking with the correct person using two identifiers. Location patient: home Location provider: work  Persons participating in the virtual visit: patient, provider  I discussed the limitations, risks, security and privacy concerns of performing an evaluation and management service by telephone and the availability of in person appointments. I also discussed with the patient that there may be a patient responsible charge related to this service. The patient expressed understanding and agreed to proceed.  Reason for visit: f/u  HPI: Upper respiratory infection: The patient notes the cough related to this has resolved.  He is not coughing up any more brown mucus.  He has had no hemoptysis.  He feels better.  GERD: Patient has chronic reflux issues.  This leads to chronic cough.  He did see pulmonology previously for this and they felt as though his cough was related to reflux.  He notes no blood in stool.  No dysphagia.  He takes Protonix once daily typically 30 minutes before breakfast.  He gets a lot of phlegm after he eats.   ROS: See pertinent positives and negatives per HPI.  Past Medical History:  Diagnosis Date  . Anxiety   . Arthritis   . BPH (benign prostatic hyperplasia) 11/19/2013  . 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 Bulging disc    C2/3, C3/4, C6/7  . Cervical disc herniation    C4/5 and C5/6  . Colon polyp 04/18/2011  . Constipation  08/31/2012  . Depression   . Diverticulosis   . 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 GERD (gastroesophageal reflux disease)   . H/O diverticulitis of colon   . Hemorrhoid   . Hiatal hernia   . Hyperlipidemia   . Levoscoliosis   . Sleep difficulties   . Spinal stenosis in cervical region    cord abutment C4/5  . Testicular pain, left   . Typical atrial flutter ([place]) 11/2015   a. CHADS2VASc => 0; b. on Eliquis for pending ablation     Past Surgical History:  Procedure Laterality Date  . ATRIAL FLUTTER ABLATION  02/17/2016  . COLONOSCOPY  2014  . COLONOSCOPY WITH PROPOFOL N/A 06/30/2016   Procedure: COLONOSCOPY WITH PROPOFOL;  Surgeon: Lucilla Lame, MD;  Location: [facility];  Service: Endoscopy;  Laterality: N/A;  . ELECTROPHYSIOLOGIC STUDY N/A 01/04/2016   Procedure: Cardioversion;  Surgeon: Wellington Hampshire, MD;  Location: [facility];  Service: Cardiovascular;  Laterality: N/A;  . ELECTROPHYSIOLOGIC STUDY N/A 02/17/2016   Procedure: A-Flutter Ablation;  Surgeon: Deboraha Sprang, MD;  Location: [facility];  Service: Cardiovascular;  Laterality: N/A;  . GANGLION CYST EXCISION Right 1994   wrist & back  . HERNIA REPAIR Right 1994   abdominal repair with mesh  . NASAL SEPTUM SURGERY  2011   Dr. [name]     Family History  Problem Relation Age of Onset  . Hyperlipidemia Mother   . Cancer Mother  skin cancer?  . Heart disease Mother 36       MI - died in her sleep  . Hyperlipidemia Father   . Heart disease Father   . Kidney disease Neg Hx   . Prostate cancer Neg Hx   . Kidney cancer Neg Hx   . Bladder Cancer Neg Hx     SOCIAL HX: Former smoker   Current Outpatient Medications:  .  albuterol (VENTOLIN HFA) 108 (90 Base) MCG/ACT inhaler, TAKE 2 PUFFS BY MOUTH EVERY 6 HOURS AS NEEDED FOR WHEEZE OR SHORTNESS OF BREATH, Disp: 8.5 each, Rfl: 1 .  ALPRAZolam (XANAX) 0.5 MG tablet, TAKE 1 TABLET BY MOUTH EVERY NIGHT AT BEDTIME AS NEEDED  FOR ANXIETY, Disp: 20 tablet, Rfl: 0 .  atorvastatin (LIPITOR) 20 MG tablet, TAKE 1 TABLET BY MOUTH IN  THE MORNING, Disp: 90 tablet, Rfl: 3 .  cyclobenzaprine (FLEXERIL) 10 MG tablet, TAKE 1 TABLET BY MOUTH 3  TIMES DAILY AS NEEDED FOR  MUSCLE SPASMS, Disp: 90 tablet, Rfl: 0 .  doxycycline (VIBRA-TABS) 100 MG tablet, Take 1 tablet (100 mg total) by mouth 2 (two) times daily., Disp: 14 tablet, Rfl: 0 .  escitalopram (LEXAPRO) 10 MG tablet, TAKE 1 TABLET BY MOUTH  DAILY, Disp: 90 tablet, Rfl: 3 .  finasteride (PROSCAR) 5 MG tablet, TAKE 1 TABLET BY MOUTH  DAILY, Disp: 90 tablet, Rfl: 3 .  loratadine (CLARITIN) 10 MG tablet, Take 10 mg by mouth daily as needed for allergies., Disp: , Rfl:  .  losartan (COZAAR) 50 MG tablet, TAKE 1 TABLET BY MOUTH  DAILY, Disp: 90 tablet, Rfl: 3 .  meloxicam (MOBIC) 15 MG tablet, TAKE 1 TABLET BY MOUTH  DAILY AS NEEDED FOR PAIN, Disp: 90 tablet, Rfl: 3 .  metroNIDAZOLE (METROGEL) 1 % gel, Apply topically daily., Disp: 60 g, Rfl: 0 .  pantoprazole (PROTONIX) 40 MG tablet, TAKE 1 TABLET BY MOUTH  DAILY, Disp: 90 tablet, Rfl: 3 .  Plecanatide (TRULANCE) 3 MG TABS, Take 3 mg by mouth daily., Disp: 90 tablet, Rfl: 3 .  tadalafil (CIALIS) 5 MG tablet, TAKE 1 TABLET BY MOUTH  DAILY, Disp: 90 tablet, Rfl: 2 .  triamcinolone (NASACORT) 55 MCG/ACT AERO nasal inhaler, Place 2 sprays into the nose daily as needed (allergies). , Disp: , Rfl:  .  amoxicillin-clavulanate (AUGMENTIN) 875-125 MG tablet, Take 1 tablet by mouth 2 (two) times daily. (Patient not taking: Reported on 06/15/2020), Disp: , Rfl:   EXAM:  VITALS per patient if applicable:  GENERAL: alert, oriented, appears well and in no acute distress  HEENT: atraumatic, conjunttiva clear, no obvious abnormalities on inspection of external nose and ears  NECK: normal movements of the head and neck  LUNGS: on inspection no signs of respiratory distress, breathing rate appears normal, no obvious gross SOB, gasping or  wheezing  CV: no obvious cyanosis  MS: moves all visible extremities without noticeable abnormality  PSYCH/NEURO: pleasant and cooperative, no obvious depression or anxiety, speech and thought processing grossly intact  ASSESSMENT AND PLAN:  Discussed the following assessment and plan:  Problem List Items Addressed This Visit    GERD (gastroesophageal reflux disease)    The patient has chronic issues with reflux.  I suspect the chronic cough is related to this.  He has seen pulmonology and they felt as though his reflux was contributing to his cough.  He will try Protonix 40 mg twice daily taken 30 minutes before breakfast and dinner.  He will let me  know how he does in 2 weeks.      Cough    Patient with likely infectious bronchitis previously.  This is resolved with antibiotics.  He will monitor for any future infectious symptoms.         No follow-ups on file.   I discussed the assessment and treatment plan with the patient. The patient was provided an opportunity to ask questions and all were answered. The patient agreed with the plan and demonstrated an understanding of the instructions.   The patient was advised to call back or seek an in-person evaluation if the symptoms worsen or if the condition fails to improve as anticipated.  [name], MD

## 2020-06-15 NOTE — Assessment & Plan Note (Signed)
The patient has chronic issues with reflux.  I suspect the chronic cough is related to this.  He has seen pulmonology and they felt as though his reflux was contributing to his cough.  He will try Protonix 40 mg twice daily taken 30 minutes before breakfast and dinner.  He will let me know how he does in 2 weeks.

## 2020-06-20 ENCOUNTER — Other Ambulatory Visit: Payer: Self-pay | Admitting: Urology

## 2020-06-20 DIAGNOSIS — R972 Elevated prostate specific antigen [PSA]: Secondary | ICD-10-CM

## 2020-06-24 ENCOUNTER — Other Ambulatory Visit: Payer: Self-pay | Admitting: Family Medicine

## 2020-06-24 DIAGNOSIS — G8929 Other chronic pain: Secondary | ICD-10-CM

## 2020-06-25 ENCOUNTER — Ambulatory Visit
Admission: RE | Admit: 2020-06-25 | Discharge: 2020-06-25 | Disposition: A | Discharge status: Home / Self Care | Payer: Medicare Other | Source: Ambulatory Visit | Attending: Family Medicine | Admitting: Family Medicine

## 2020-06-25 ENCOUNTER — Other Ambulatory Visit: Payer: Self-pay

## 2020-06-25 DIAGNOSIS — R221 Localized swelling, mass and lump, neck: Secondary | ICD-10-CM | POA: Diagnosis not present

## 2020-06-25 DIAGNOSIS — L729 Follicular cyst of the skin and subcutaneous tissue, unspecified: Secondary | ICD-10-CM | POA: Diagnosis not present

## 2020-07-13 ENCOUNTER — Other Ambulatory Visit: Payer: Self-pay | Admitting: Gastroenterology

## 2020-07-29 ENCOUNTER — Other Ambulatory Visit: Payer: Self-pay

## 2020-07-29 ENCOUNTER — Other Ambulatory Visit (INDEPENDENT_AMBULATORY_CARE_PROVIDER_SITE_OTHER): Payer: Medicare Other

## 2020-07-29 DIAGNOSIS — E785 Hyperlipidemia, unspecified: Secondary | ICD-10-CM

## 2020-07-29 DIAGNOSIS — I10 Essential (primary) hypertension: Secondary | ICD-10-CM | POA: Diagnosis not present

## 2020-07-30 LAB — LIPID PANEL
Cholesterol: 151 mg/dL (ref 0–200)
HDL: 47.9 mg/dL (ref >39.00)
LDL Cholesterol: 87 mg/dL (ref 0–99)
NonHDL: 102.73
Total CHOL/HDL Ratio: 3
Triglycerides: 81 mg/dL (ref 0.0–149.0)
VLDL: 16.2 mg/dL (ref 0.0–40.0)

## 2020-07-30 LAB — COMPREHENSIVE METABOLIC PANEL
ALT: 17 U/L (ref 0–53)
AST: 20 U/L (ref 0–37)
Albumin: 4.2 g/dL (ref 3.5–5.2)
Alkaline Phosphatase: 64 U/L (ref 39–117)
BUN: 18 mg/dL (ref 6–23)
CO2: 27 mEq/L (ref 19–32)
Calcium: 9.7 mg/dL (ref 8.4–10.5)
Chloride: 106 mEq/L (ref 96–112)
Creatinine, Ser: 0.95 mg/dL (ref 0.40–1.50)
GFR: 84.74 mL/min (ref >60.00)
Glucose, Bld: 91 mg/dL (ref 70–99)
Potassium: 4.1 mEq/L (ref 3.5–5.1)
Sodium: 140 mEq/L (ref 135–145)
Total Bilirubin: 1.5 mg/dL — ABNORMAL HIGH (ref 0.2–1.2)
Total Protein: 6.5 g/dL (ref 6.0–8.3)

## 2020-08-09 ENCOUNTER — Other Ambulatory Visit: Payer: Self-pay | Admitting: Family Medicine

## 2020-08-16 DIAGNOSIS — Z20822 Contact with and (suspected) exposure to covid-19: Secondary | ICD-10-CM | POA: Diagnosis not present

## 2020-08-17 ENCOUNTER — Telehealth: Payer: Self-pay | Admitting: Family Medicine

## 2020-08-17 NOTE — Telephone Encounter (Signed)
Access nurse notified us to schedule appointment within 24 hrs. Patient has appointment tomorrow with NP

## 2020-08-17 NOTE — Telephone Encounter (Signed)
Transfer to Access Nurse Sonia Baller  PT called in to advise that since last Thursday they have not been feeling well. They have done 2 at home tests that were negative as well as gone to [facility] yesterday with no results yet. PT states they had a 102 fever yesterday. PT also states they have had restless night, not wanting to eat, left side head hurting, chills, coughing and red dots appearing over body. Nothing available for today and transfer to Access Nurse.

## 2020-08-17 NOTE — Telephone Encounter (Signed)
Noted.  If he starts to feel worse he needs to be seen in person prior to tomorrow.  Thanks.

## 2020-08-18 ENCOUNTER — Telehealth: Payer: Medicare Other | Admitting: Family

## 2020-08-18 ENCOUNTER — Encounter: Payer: Self-pay | Admitting: Emergency Medicine

## 2020-08-18 ENCOUNTER — Other Ambulatory Visit: Payer: Self-pay

## 2020-08-18 ENCOUNTER — Ambulatory Visit
Admission: EM | Admit: 2020-08-18 | Discharge: 2020-08-18 | Disposition: A | Discharge status: Home / Self Care | Payer: Medicare Other | Attending: Emergency Medicine | Admitting: Emergency Medicine

## 2020-08-18 DIAGNOSIS — B349 Viral infection, unspecified: Secondary | ICD-10-CM | POA: Diagnosis not present

## 2020-08-18 DIAGNOSIS — R509 Fever, unspecified: Secondary | ICD-10-CM

## 2020-08-18 NOTE — Discharge Instructions (Addendum)
Your lab results will be back within the next 1 to 2 days and if we need to initiate treatment pending this result, we will contact you.  Follow-up with your PCP within the next 2 to 3 days if your symptoms do not improve.  For most people this is a self-limiting process and can take anywhere from 7 - 10 days to start feeling better. A cough can last up to 3 weeks. Pay special attention to handwashing as this can help prevent the spread of the virus.   Always read the labels of cough and cold medications as they may contain some of the ingredients below.  Rest, push lots of fluids (especially water), and utilize supportive care for symptoms. You may take acetaminophen (Tylenol) every 4-6 hours and ibuprofen every 6-8 hours for muscle pain, joint pain, headaches (you may also alternate these medications). Mucinex (guaifenesin) may be taken over the counter for cough as needed can loosen phlegm. Please read the instructions and take as directed.  Sudafed (pseudophedrine) is sold behind the counter and can help reduce nasal pressure; avoid taking this if you have high blood pressure or feel jittery. Sudafed PE (phenylephrine) can be a helpful, short-term, over-the-counter alternative to limit side effects or if you have high blood pressure.  Flonase nasal spray can help alleviate congestion and sinus pressure. Many patients choose Afrin as a nasal decongestant; do not use for more than 3 days for risk of rebound (increased symptoms after stopping medication).  Saline nasal sprays or rinses can also help nasal congestion (use bottled or sterile water). Warm tea with lemon and honey can sooth sore throat and cough, as can cough drops.   Return to clinic for high fever not improving with medications, chest pain, difficulty breathing, non-stop vomiting, or coughing blood. Follow-up with your primary care provider if symptoms do not improve as expected in the next 5-7 days.

## 2020-08-18 NOTE — ED Triage Notes (Addendum)
Patient c/o nonproductive cough, fever, and headache x 5 days.   Patient endorses a temperature of 102.7 F at it's highest at home (on Sunday).   Patient endorses a temperature of 101.7 F at it's highest.   Patient took an at home COVID test and reports a negative test result. Patient also [facility] and received a negative test result.   Patient endorses "sweats". Patient endorses fatigue.   Patient endorses "some red spots", on both arms and ABD area.   Patient endorses chest congestion. Patient endorses nasal congestion.   Patient endorses a chronic cough due to "hiatal hernia and acid reflux".   Patient has taken Tylenol, Advil, and Nyquil with no relief of symptoms.

## 2020-08-18 NOTE — ED Provider Notes (Signed)
CHIEF COMPLAINT:   Chief Complaint  Patient presents with   Cough   Fever   Headache     SUBJECTIVE/HPI:   Cough Associated symptoms: fever and headaches   Fever Associated symptoms: cough and headaches   Headache Associated symptoms: cough and fever   A very pleasant 65 y.o.Male presents today with cough, headache, fever for the last 5 days.  Patient states that he received a T-max of 102.7 F on Sunday and states that upon waking today his temp was 101.7 F.  Patient states that he has been having some sweating and has noticed some "red spots on arms and stomach".  Patient also reports some chest congestion and nasal congestion.  Patient states that he has tried Tylenol, Advil and NyQuil without relief of symptoms.  He does not report any known sick contacts, shortness of breath, chest pain, palpitations, visual changes or weakness.  He also does not report any nausea, vomiting or diarrhea.   has a past medical history of Anxiety, Arthritis, BPH (benign prostatic hyperplasia) (11/19/2013), Bulge of lumbar disc without myelopathy, Bulging disc, Cervical disc herniation, Colon polyp (04/18/2011), Constipation (08/31/2012), Depression, Diverticulosis, ED (erectile dysfunction) of organic origin (01/04/2012), GERD (gastroesophageal reflux disease), H/O diverticulitis of colon, Hemorrhoid, Hiatal hernia, Hyperlipidemia, Levoscoliosis, Sleep difficulties, Spinal stenosis in cervical region, Testicular pain, left, and Typical atrial flutter ([place]) (11/2015). ROS:  Review of Systems  Constitutional:  Positive for fever.  Respiratory:  Positive for cough.   Neurological:  Positive for headaches.  See Subjective/HPI Medications, Allergies and Problem List personally reviewed in Epic today OBJECTIVE:   Vitals:   08/18/20 0821  BP: 128/76  Pulse: 89  Resp: 18  Temp: 98.4 F (36.9 C)  SpO2: 96%    Physical Exam   General: Appears well-developed and well-nourished. No acute distress.   HEENT Head: Normocephalic and atraumatic.  Ears: Hearing grossly intact, no drainage or visible deformity.  Nose: No nasal deviation or rhinorrhea.  Mouth/Throat: No stridor or tracheal deviation.  Eyes: Conjunctivae and EOM are normal. No eye drainage or scleral icterus bilaterally.  Neck: Normal range of motion, neck is supple.  Cardiovascular: Normal rate . Regular rhythm; no murmurs, gallops, or rubs.  Pulm/Chest: No respiratory distress. Breath sounds normal bilaterally without wheezes, rhonchi, or rales.  Abdominal: Soft, non-distended, and non-tender without rebound or guarding. Bowel tones normotonic in all quadrants. No masses or hepatosplenomegaly.  Musculoskeletal: No joint deformity, normal range of motion.  Neurological: Alert and oriented to person, place, and time.  Skin: Skin is warm and dry.  There are small various areas of insect bites noted to abdomen, left arm, upper back. Psychiatric: Normal mood, affect, behavior, and thought content.   Vital signs and nursing note reviewed.   Patient stable and cooperative with examination.  LABS/X-RAYS/EKG/MEDS:   No results found for any visits on 08/18/20.  MEDICAL DECISION MAKING:   Patient presents with cough, headache, fever for the last 5 days.  Patient states that he received a T-max of 102.7 F on Sunday and states that upon waking today his temp was 101.7 F.  Patient states that he has been having some sweating and has noticed some "red spots on arms and stomach".  Patient also reports some chest congestion and nasal congestion.  Patient states that he has tried Tylenol, Advil and NyQuil without relief of symptoms.  He does not report any known sick contacts, shortness of breath, chest pain, palpitations, visual changes or weakness.  He also does not  report any nausea, vomiting or diarrhea.  Chart review completed.  Given symptoms along with assessment findings, likely viral illness.  Did obtain a CBC in clinic today  given the patient's reported fever which is not present during his visit today.  Advised that we would call with any abnormal findings which are concerning that he may need to follow-up with his PCP.  Did discuss COVID-19 testing versus flu testing, but given that the patient has had multiple negative test at home and 1 negative PCR COVID-19 test I have low suspicion for this illness.  Patient is also outside of the treatment window for influenza, deferred testing today.  Advised about home treatment and care as outlined in his AVS.  Return as needed.  Stable on discharge.  Patient verbalized understanding and agreed with plan.  ASSESSMENT/PLAN:  1. Fever in adult - CBC; Standing - CBC  2. Viral illness  No orders of the defined types were placed in this encounter.  Instructions about new medications and side effects provided.  Plan:   Discharge Instructions      Your lab results will be back within the next 1 to 2 days and if we need to initiate treatment pending this result, we will contact you.  Follow-up with your PCP within the next 2 to 3 days if your symptoms do not improve.  For most people this is a self-limiting process and can take anywhere from 7 - 10 days to start feeling better. A cough can last up to 3 weeks. Pay special attention to handwashing as this can help prevent the spread of the virus.   Always read the labels of cough and cold medications as they may contain some of the ingredients below.  Rest, push lots of fluids (especially water), and utilize supportive care for symptoms. You may take acetaminophen (Tylenol) every 4-6 hours and ibuprofen every 6-8 hours for muscle pain, joint pain, headaches (you may also alternate these medications). Mucinex (guaifenesin) may be taken over the counter for cough as needed can loosen phlegm. Please read the instructions and take as directed.  Sudafed (pseudophedrine) is sold behind the counter and can help reduce nasal  pressure; avoid taking this if you have high blood pressure or feel jittery. Sudafed PE (phenylephrine) can be a helpful, short-term, over-the-counter alternative to limit side effects or if you have high blood pressure.  Flonase nasal spray can help alleviate congestion and sinus pressure. Many patients choose Afrin as a nasal decongestant; do not use for more than 3 days for risk of rebound (increased symptoms after stopping medication).  Saline nasal sprays or rinses can also help nasal congestion (use bottled or sterile water). Warm tea with lemon and honey can sooth sore throat and cough, as can cough drops.   Return to clinic for high fever not improving with medications, chest pain, difficulty breathing, non-stop vomiting, or coughing blood. Follow-up with your primary care provider if symptoms do not improve as expected in the next 5-7 days.       A copy of these instructions have been given to the patient or responsible adult who demonstrated the ability to learn, asked appropriate questions, and verbalized understanding of the plan of care.  There were no barriers to learning identified.   Tyler Royals, FNP-C 08/18/20  This note was partially made with the aid of speech-to-text dictation; typographical errors are not intentional.    [name], [place] 08/18/20 0900

## 2020-08-19 LAB — CBC
Hematocrit: 43.7 % (ref 37.5–51.0)
Hemoglobin: 14.7 g/dL (ref 13.0–17.7)
MCH: 29.9 pg (ref 26.6–33.0)
MCHC: 33.6 g/dL (ref 31.5–35.7)
MCV: 89 fL (ref 79–97)
Platelets: 159 10*3/uL (ref 150–450)
RBC: 4.91 x10E6/uL (ref 4.14–5.80)
RDW: 13.5 % (ref 11.6–15.4)
WBC: 4.5 10*3/uL (ref 3.4–10.8)

## 2020-08-20 ENCOUNTER — Encounter: Payer: Self-pay | Admitting: *Deleted

## 2020-08-20 ENCOUNTER — Emergency Department
Admission: EM | Admit: 2020-08-20 | Discharge: 2020-08-20 | Disposition: A | Discharge status: Home / Self Care | Payer: Medicare Other | Attending: Emergency Medicine | Admitting: Emergency Medicine

## 2020-08-20 ENCOUNTER — Emergency Department: Payer: Medicare Other

## 2020-08-20 ENCOUNTER — Other Ambulatory Visit: Payer: Self-pay

## 2020-08-20 DIAGNOSIS — R21 Rash and other nonspecific skin eruption: Secondary | ICD-10-CM

## 2020-08-20 DIAGNOSIS — Z87891 Personal history of nicotine dependence: Secondary | ICD-10-CM | POA: Diagnosis not present

## 2020-08-20 DIAGNOSIS — Z79899 Other long term (current) drug therapy: Secondary | ICD-10-CM | POA: Insufficient documentation

## 2020-08-20 DIAGNOSIS — Z20822 Contact with and (suspected) exposure to covid-19: Secondary | ICD-10-CM | POA: Insufficient documentation

## 2020-08-20 DIAGNOSIS — M791 Myalgia, unspecified site: Secondary | ICD-10-CM | POA: Insufficient documentation

## 2020-08-20 DIAGNOSIS — R059 Cough, unspecified: Secondary | ICD-10-CM | POA: Diagnosis not present

## 2020-08-20 DIAGNOSIS — Z8616 Personal history of COVID-19: Secondary | ICD-10-CM | POA: Diagnosis not present

## 2020-08-20 DIAGNOSIS — R509 Fever, unspecified: Secondary | ICD-10-CM | POA: Insufficient documentation

## 2020-08-20 DIAGNOSIS — R519 Headache, unspecified: Secondary | ICD-10-CM | POA: Diagnosis not present

## 2020-08-20 DIAGNOSIS — I10 Essential (primary) hypertension: Secondary | ICD-10-CM | POA: Insufficient documentation

## 2020-08-20 LAB — CBC WITH DIFFERENTIAL/PLATELET
Abs Immature Granulocytes: 0.01 10*3/uL (ref 0.00–0.07)
Basophils Absolute: 0 10*3/uL (ref 0.0–0.1)
Basophils Relative: 1 %
Eosinophils Absolute: 0 10*3/uL (ref 0.0–0.5)
Eosinophils Relative: 0 %
HCT: 39.1 % (ref 39.0–52.0)
Hemoglobin: 13.6 g/dL (ref 13.0–17.0)
Immature Granulocytes: 0 %
Lymphocytes Relative: 20 %
Lymphs Abs: 1.3 10*3/uL (ref 0.7–4.0)
MCH: 30 pg (ref 26.0–34.0)
MCHC: 34.8 g/dL (ref 30.0–36.0)
MCV: 86.3 fL (ref 80.0–100.0)
Monocytes Absolute: 0.7 10*3/uL (ref 0.1–1.0)
Monocytes Relative: 11 %
Neutro Abs: 4.3 10*3/uL (ref 1.7–7.7)
Neutrophils Relative %: 68 %
Platelets: 169 10*3/uL (ref 150–400)
RBC: 4.53 MIL/uL (ref 4.22–5.81)
RDW: 13.1 % (ref 11.5–15.5)
WBC: 6.4 10*3/uL (ref 4.0–10.5)
nRBC: 0 % (ref 0.0–0.2)

## 2020-08-20 LAB — COMPREHENSIVE METABOLIC PANEL
ALT: 59 U/L — ABNORMAL HIGH (ref 0–44)
AST: 41 U/L (ref 15–41)
Albumin: 3.5 g/dL (ref 3.5–5.0)
Alkaline Phosphatase: 63 U/L (ref 38–126)
Anion gap: 7 (ref 5–15)
BUN: 13 mg/dL (ref 8–23)
CO2: 24 mmol/L (ref 22–32)
Calcium: 8.6 mg/dL — ABNORMAL LOW (ref 8.9–10.3)
Chloride: 100 mmol/L (ref 98–111)
Creatinine, Ser: 0.84 mg/dL (ref 0.61–1.24)
GFR, Estimated: >60 mL/min (ref >60)
Glucose, Bld: 139 mg/dL — ABNORMAL HIGH (ref 70–99)
Potassium: 3.6 mmol/L (ref 3.5–5.1)
Sodium: 131 mmol/L — ABNORMAL LOW (ref 135–145)
Total Bilirubin: 1.7 mg/dL — ABNORMAL HIGH (ref 0.3–1.2)
Total Protein: 6.5 g/dL (ref 6.5–8.1)

## 2020-08-20 LAB — RESP PANEL BY RT-PCR (FLU A&B, COVID) ARPGX2
Influenza A by PCR: NEGATIVE
Influenza B by PCR: NEGATIVE
SARS Coronavirus 2 by RT PCR: NEGATIVE

## 2020-08-20 LAB — RPR: RPR Ser Ql: NONREACTIVE

## 2020-08-20 LAB — HIV ANTIBODY (ROUTINE TESTING W REFLEX): HIV Screen 4th Generation wRfx: NONREACTIVE

## 2020-08-20 MED ORDER — BUTALBITAL-APAP-CAFFEINE 50-325-40 MG PO TABS: 1.0000 | ORAL_TABLET | Freq: Four times a day (QID) | ORAL | 0 refills | Status: DC | PRN

## 2020-08-20 MED ORDER — PROCHLORPERAZINE EDISYLATE 10 MG/2ML IJ SOLN
10.0000 mg | Freq: Once | INTRAMUSCULAR | Status: AC
  Administered 2020-08-20: 10 mg via INTRAVENOUS
  Filled 2020-08-20: qty 2

## 2020-08-20 MED ORDER — DOXYCYCLINE HYCLATE 100 MG PO CAPS: 100.0000 mg | ORAL_CAPSULE | Freq: Two times a day (BID) | ORAL | 0 refills | Status: AC

## 2020-08-20 MED ORDER — SODIUM CHLORIDE 0.9 % IV BOLUS
1000.0000 mL | Freq: Once | INTRAVENOUS | Status: AC
  Administered 2020-08-20: 1000 mL via INTRAVENOUS

## 2020-08-20 MED ORDER — DIPHENHYDRAMINE HCL 50 MG/ML IJ SOLN
25.0000 mg | Freq: Once | INTRAMUSCULAR | Status: AC
  Administered 2020-08-20: 25 mg via INTRAVENOUS
  Filled 2020-08-20: qty 1

## 2020-08-20 MED ORDER — ACETAMINOPHEN 325 MG PO TABS
650.0000 mg | ORAL_TABLET | Freq: Once | ORAL | Status: AC
  Administered 2020-08-20: 650 mg via ORAL
  Filled 2020-08-20: qty 2

## 2020-08-20 MED ORDER — DOXYCYCLINE HYCLATE 100 MG PO TABS
100.0000 mg | ORAL_TABLET | Freq: Once | ORAL | Status: AC
  Administered 2020-08-20: 100 mg via ORAL
  Filled 2020-08-20: qty 1

## 2020-08-20 NOTE — ED Provider Notes (Signed)
[facility] Emergency Department Provider Note  ____________________________________________   Event Date/Time   First MD Initiated Contact with Patient 08/20/20 0044     (approximate)  I have reviewed the triage vital signs and the nursing notes.   HISTORY  Chief Complaint Fever    HPI [name]. is a 65 y.o. male  with h/o HTN, HLD, aflutter, here with rash, fever. Pt reports that for the past 5 days, he has had daily fevers, body aches, headaches, and chills. He has also had a red, nonpainful rash that began on his arms, has spread to his trunk and now his palms for past 24 hours. He has been seen by Urgent Care and diagnosed with viral illness. No known exposures, but daughter just started fever today (18yo, works @ [employer]). No known COVID exposures. No recent travel. Does not recall specific tick bites. HA is generalized, aching. No photophobia, phonophobia, neck stiffness. No vomiting or diarrhea. No abd pain.        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 CHADS2VASc => 0; b. on Eliquis for pending ablation     Patient Active Problem List   Diagnosis Date Noted   Skin cyst 06/03/2020   Epididymal cyst 06/03/2020   Rash 03/04/2020   Cough 19/41/7408   History of Helicobacter pylori infection  05/17/2019   History of COVID-19 05/29/2018   GERD (gastroesophageal reflux disease) 01/16/2018   Sleep apnea 01/16/2018   Bilateral leg numbness 12/13/2017   Chronic pain of both upper extremities 12/13/2017   Left leg pain 12/13/2017   Excessive gas 11/01/2017   Elevated glucose 04/24/2017   Cervical radiculopathy 01/23/2017   Hypertension 01/23/2017   Chronic cough 08/12/2016   Constipation 05/19/2016   Neck swelling 05/02/2016   Chronic low back pain 03/30/2016   Right knee pain 03/30/2016   History of atrial flutter    Elevated PSA 12/24/2015   Dyspnea    Chronic neck pain    Adjustment disorder with mixed anxiety and depressed mood 07/23/2015   OAB (overactive bladder) 03/31/2015   BPH (benign prostatic hyperplasia) 11/19/2013   Hyperlipidemia 11/19/2013   Spinal stenosis in cervical region 03/08/2013   Lipoma of back 03/08/2013   Benign prostatic hyperplasia with urinary obstruction 08/23/2012   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/2012   Colon polyp 04/18/2011    Past Surgical History:  Procedure Laterality Date   ATRIAL FLUTTER ABLATION  02/17/2016   COLONOSCOPY  2014   COLONOSCOPY WITH PROPOFOL N/A 06/30/2016   Procedure: COLONOSCOPY WITH PROPOFOL;  Surgeon: Lucilla Lame, MD;  Location: [place];  Service: Endoscopy;  Laterality: N/A;   ELECTROPHYSIOLOGIC STUDY  N/A 01/04/2016   Procedure: Cardioversion;  Surgeon: Wellington Hampshire, MD;  Location: [facility];  Service: Cardiovascular;  Laterality: N/A;   ELECTROPHYSIOLOGIC STUDY N/A 02/17/2016   Procedure: A-Flutter Ablation;  Surgeon: Deboraha Sprang, MD;  Location: [place] [facility];  Service: Cardiovascular;  Laterality: N/A;   GANGLION CYST EXCISION Right 1994   wrist & back   HERNIA REPAIR Right 1994   abdominal repair with mesh   NASAL SEPTUM SURGERY  2011   Dr. [name]     Prior to Admission medications   Medication Sig Start Date End Date Taking? Authorizing Provider   butalbital-acetaminophen-caffeine (FIORICET) 50-325-40 MG tablet Take 1-2 tablets by mouth every 6 (six) hours as needed for headache. 08/20/20 08/20/21 Yes Duffy Bruce, MD  doxycycline (VIBRAMYCIN) 100 MG capsule Take 1 capsule (100 mg total) by mouth 2 (two) times daily for 10 days. 08/20/20 08/30/20 Yes Duffy Bruce, MD  albuterol (VENTOLIN HFA) 108 (90 Base) MCG/ACT inhaler TAKE 2 PUFFS BY MOUTH EVERY 6 HOURS AS NEEDED FOR WHEEZE OR SHORTNESS OF BREATH 08/11/20   [name], MD  ALPRAZolam Duanne Moron) 0.5 MG tablet TAKE 1 TABLET BY MOUTH EVERY NIGHT AT BEDTIME AS NEEDED FOR ANXIETY 06/03/20   [name], MD  amoxicillin-clavulanate (AUGMENTIN) 875-125 MG tablet Take 1 tablet by mouth 2 (two) times daily. Patient not taking: No sig reported 05/25/20   [provider]  atorvastatin (LIPITOR) 20 MG tablet TAKE 1 TABLET BY MOUTH IN  THE MORNING 04/06/20   [name], MD  cyclobenzaprine (FLEXERIL) 10 MG tablet TAKE 1 TABLET BY MOUTH 3  TIMES DAILY AS NEEDED FOR  MUSCLE SPASM(S) 06/24/20   [name], MD  escitalopram (LEXAPRO) 10 MG tablet TAKE 1 TABLET BY MOUTH  DAILY 03/30/20   [name], MD  finasteride (PROSCAR) 5 MG tablet TAKE 1 TABLET BY MOUTH  DAILY 06/23/20   Zara Council A, [name]  loratadine (CLARITIN) 10 MG tablet Take 10 mg by mouth daily as needed for allergies.    [provider]  losartan (COZAAR) 50 MG tablet TAKE 1 TABLET BY MOUTH  DAILY 07/09/19   [name], MD  meloxicam (MOBIC) 15 MG tablet TAKE 1 TABLET BY MOUTH  DAILY AS NEEDED FOR PAIN 03/23/20   [name], MD  metroNIDAZOLE (METROGEL) 1 % gel Apply topically daily. 03/04/20   [name], MD  pantoprazole (PROTONIX) 40 MG tablet TAKE 1 TABLET BY MOUTH  DAILY 05/04/20   [name], MD  tadalafil (CIALIS) 5 MG tablet TAKE 1 TABLET BY MOUTH  DAILY 05/25/20   Stoioff, Ronda Fairly, MD  triamcinolone (NASACORT) 55 MCG/ACT AERO nasal inhaler Place 2 sprays  into the nose daily as needed (allergies).     [provider]  TRULANCE 3 MG TABS TAKE 1 TABLET BY MOUTH  DAILY 07/14/20   Lucilla Lame, MD    Allergies Other  Family History  Problem Relation Age of Onset   Hyperlipidemia Mother    Cancer Mother        skin cancer?   Heart disease Mother 73       MI - died in her sleep   Hyperlipidemia Father    Heart disease Father    Kidney disease Neg Hx    Prostate cancer Neg Hx    Kidney cancer Neg Hx    Bladder Cancer Neg Hx     Social History Social History   Tobacco Use  Smoking status: Former    Packs/day: 1.00    Years: 20.00    Pack years: 20.00    Types: Cigarettes    Quit date: 02/08/1991    Years since quitting: 29.5   Smokeless tobacco: Never  Substance Use Topics   Alcohol use: No    Alcohol/week: 0.0 standard drinks   Drug use: No    Review of Systems  Review of Systems  Constitutional:  Positive for chills, fatigue and fever.  HENT:  Negative for sore throat.   Respiratory:  Positive for cough. Negative for shortness of breath.   Cardiovascular:  Negative for chest pain.  Gastrointestinal:  Negative for abdominal pain.  Genitourinary:  Negative for flank pain.  Musculoskeletal:  Positive for arthralgias. Negative for neck pain.  Skin:  Positive for rash. Negative for wound.  Allergic/Immunologic: Negative for immunocompromised state.  Neurological:  Positive for headaches. Negative for weakness and numbness.  Hematological:  Does not bruise/bleed easily.  All other systems reviewed and are negative.   ____________________________________________  PHYSICAL EXAM:      VITAL SIGNS: ED Triage Vitals [08/20/20 0016]  Enc Vitals Group     BP 113/75     Pulse Rate 98     Resp 16     Temp 100.2 F (37.9 C)     Temp Source Oral     SpO2 96 %     Weight      Height      Head Circumference      Peak Flow      Pain Score 8     Pain Loc      Pain Edu?      Excl. in [place]?      Physical  Exam Vitals and nursing note reviewed.  Constitutional:      General: He is not in acute distress.    Appearance: He is well-developed.  HENT:     Head: Normocephalic and atraumatic.     Comments: Mild posterior pharyngeal erythema. Eyes:     Conjunctiva/sclera: Conjunctivae normal.  Cardiovascular:     Rate and Rhythm: Normal rate and regular rhythm.     Heart sounds: Normal heart sounds. No murmur heard.   No friction rub.  Pulmonary:     Effort: Pulmonary effort is normal. No respiratory distress.     Breath sounds: Normal breath sounds. No wheezing or rales.  Abdominal:     General: There is no distension.     Palpations: Abdomen is soft.     Tenderness: There is no abdominal tenderness.  Musculoskeletal:     Cervical back: Neck supple.  Skin:    General: Skin is warm.     Capillary Refill: Capillary refill takes less than 2 seconds.     Comments: Maculopapular, erythematous rash to bl UE, trunk. This involves the left thenar eminence. No ulcerations. No plaques.   Neurological:     Mental Status: He is alert and oriented to person, place, and time.     Motor: No abnormal muscle tone.      ____________________________________________   LABS (all labs ordered are listed, but only abnormal results are displayed)  Labs Reviewed  COMPREHENSIVE METABOLIC PANEL - Abnormal; Notable for the following components:      Result Value   Sodium 131 (*)    Glucose, Bld 139 (*)    Calcium 8.6 (*)    ALT 59 (*)    Total Bilirubin 1.7 (*)    All other components  within normal limits  RESP PANEL BY RT-PCR (FLU A&B, COVID) ARPGX2  CULTURE, BLOOD (SINGLE)  CBC WITH DIFFERENTIAL/PLATELET  ROCKY MTN SPOTTED FVR ABS PNL(IGG+IGM)  RPR  HIV ANTIBODY (ROUTINE TESTING W REFLEX)    ____________________________________________  EKG:  ________________________________________  RADIOLOGY All imaging, including plain films, CT scans, and ultrasounds, independently reviewed by me, and  interpretations confirmed via formal radiology reads.  ED MD interpretation:   Chest x-ray: No active disease  Official radiology report(s): DG Chest 2 View  Result Date: 08/20/2020 CLINICAL DATA:  65 year old male with fever and cough. EXAM: CHEST - 2 VIEW COMPARISON:  Chest radiograph dated 06/03/2020. FINDINGS: The lungs are clear. There is no pleural effusion pneumothorax. The cardiac silhouette is within limits. No acute osseous pathology. IMPRESSION: No active cardiopulmonary disease. Electronically Signed   By: Anner Crete M.D.   On: 08/20/2020 00:34    ____________________________________________  PROCEDURES   Procedure(s) performed (including Critical Care):  Procedures  ____________________________________________  INITIAL IMPRESSION / MDM / [place] / ED COURSE  As part of my medical decision making, I reviewed the following data within the [place] notes reviewed and incorporated, Old chart reviewed, Notes from prior ED visits, and [place] Controlled Substance [place]. was evaluated in Emergency Department on 08/20/2020 for the symptoms described in the history of present illness. He was evaluated in the context of the global COVID-19 pandemic, which necessitated consideration that the patient might be at risk for infection with the SARS-CoV-2 virus that causes COVID-19. Institutional protocols and algorithms that pertain to the evaluation of patients at risk for COVID-19 are in a state of rapid change based on information released by regulatory bodies including the CDC and federal and state organizations. These policies and algorithms were followed during the patient's care in the ED.  Some ED evaluations and interventions may be delayed as a result of limited staffing during the pandemic.*     Medical Decision Making: 65 yo M here with fever x 4-5 days, rash. Pt is overall nontoxic, well appearing on exam. Rash  is as above. VSS. Labs show no leukocytosis or thrombocytopenia. CMP with mild hyponatremia, slight ALT elevation. COVID, influenza negative. HIV, RPR pending. DDx includes: RMSF, viral illness w/ EM. Less likely HIV, RPR. No signs of SJS/TEN on exam, no mucosal involvement. Pt is nontoxic. Rash, exam is not c/f meningococcemia. No purpura or petechiae. Will place on empiric doxy, d/c with outpt follow-up.  ____________________________________________  FINAL CLINICAL IMPRESSION(S) / ED DIAGNOSES  Final diagnoses:  Febrile illness  Rash     MEDICATIONS GIVEN DURING THIS VISIT:  Medications  prochlorperazine (COMPAZINE) injection 10 mg (10 mg Intravenous Given 08/20/20 0129)  diphenhydrAMINE (BENADRYL) injection 25 mg (25 mg Intravenous Given 08/20/20 0130)  sodium chloride 0.9 % bolus 1,000 mL (0 mLs Intravenous Stopped 08/20/20 0220)  acetaminophen (TYLENOL) tablet 650 mg (650 mg Oral Given 08/20/20 0129)  doxycycline (VIBRA-TABS) tablet 100 mg (100 mg Oral Given 08/20/20 0240)     ED Discharge Orders          Ordered    doxycycline (VIBRAMYCIN) 100 MG capsule  2 times daily        08/20/20 0243    butalbital-acetaminophen-caffeine (FIORICET) 50-325-40 MG tablet  Every 6 hours PRN        08/20/20 0243             Note:  This document was  prepared using Systems analyst and may include unintentional dictation errors.   Duffy Bruce, MD 08/20/20 [phone]

## 2020-08-20 NOTE — ED Notes (Signed)
Patient transported to X-ray 

## 2020-08-20 NOTE — ED Triage Notes (Addendum)
Headache, cough, and fever since Friday. Red bumps on arms/legs and abdomen since Saturday. Home and [facility] test negative. Seen at [facility] for the same two days ago, states he is not any better. Last tylenol approx 1 hour pta.

## 2020-08-23 ENCOUNTER — Other Ambulatory Visit: Payer: Self-pay | Admitting: Family Medicine

## 2020-08-24 LAB — ROCKY MTN SPOTTED FVR ABS PNL(IGG+IGM)
RMSF IgG: NEGATIVE
RMSF IgM: 0.47 index (ref 0.00–0.89)

## 2020-08-25 LAB — CULTURE, BLOOD (SINGLE): Culture: NO GROWTH

## 2020-08-26 ENCOUNTER — Ambulatory Visit (INDEPENDENT_AMBULATORY_CARE_PROVIDER_SITE_OTHER): Payer: Medicare Other | Admitting: Family Medicine

## 2020-08-26 ENCOUNTER — Other Ambulatory Visit: Payer: Self-pay

## 2020-08-26 VITALS — Ht 70.0 in | Wt 191.0 lb

## 2020-08-26 DIAGNOSIS — R509 Fever, unspecified: Secondary | ICD-10-CM

## 2020-08-26 DIAGNOSIS — E871 Hypo-osmolality and hyponatremia: Secondary | ICD-10-CM

## 2020-08-26 NOTE — Assessment & Plan Note (Signed)
Undetermined cause though this is highly suspicious for [facility] spotted fever.  His symptoms as well as his CMP findings would be consistent with [facility] spotted fever.  He did have negative titers for this and no known tick bite.  Discussed this could have been related to a virus as well.  I encouraged him to complete his course of doxycycline.  I also encouraged him to have his wife do a good skin exam to ensure that there is no attached tick.  Advised to seek medical attention if he has worsening symptoms.  He will come in early next week for follow-up lab work.

## 2020-08-26 NOTE — Progress Notes (Signed)
Virtual Visit via telephone Note  This visit type was conducted due to national recommendations for restrictions regarding the COVID-19 pandemic (e.g. social distancing).  This format is felt to be most appropriate for this patient at this time.  All issues noted in this document were discussed and addressed.  No physical exam was performed (except for noted visual exam findings with Video Visits).   I connected with [name] today at  3:15 PM EDT by a video enabled telemedicine application or telephone and verified that I am speaking with the correct person using two identifiers. Location patient: home Location provider: work  Persons participating in the virtual visit: patient, provider  I discussed the limitations, risks, security and privacy concerns of performing an evaluation and management service by telephone and the availability of in person appointments. I also discussed with the patient that there may be a patient responsible charge related to this service. The patient expressed understanding and agreed to proceed.  Interactive audio and video telecommunications were attempted between this provider and patient, however failed, due to patient having technical difficulties OR patient did not have access to video capability.  We continued and completed visit with audio only.   Reason for visit: f/u.  HPI: Febrile illness: Patient notes he started to feel unwell around 08/14/2020.  His T-max was 102.7 F.  He had fever, body aches, headaches, and chills.  He developed a red rash scattered over his body.  There is no itching.  There were no bumps to the rash.  He broke out in a large sweat and had vomiting on 08/19/2020.  He had a bad headache on 7/14 and subsequently went to the emergency room after being evaluated at urgent care.  He had multiple negative home COVID tests and a negative PCR test prior to going to the emergency department.  He had a negative PCR COVID test in the emergency  department.  He had negative HIV screen screening and negative RPR.  He had negative flu test.  Has negative blood culture.  His [facility] spotted fever titers were negative as well.  He had no known tick bite.  He feels quite a bit better now.  The emergency department placed him on doxycycline.   ROS: See pertinent positives and negatives per HPI.  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 CHADS2VASc => 0; b. on Eliquis for pending ablation     Past Surgical History:  Procedure Laterality Date   ATRIAL FLUTTER ABLATION  02/17/2016   COLONOSCOPY  2014   COLONOSCOPY WITH PROPOFOL N/A 06/30/2016   Procedure: COLONOSCOPY WITH PROPOFOL;  Surgeon: Lucilla Lame, MD;  Location: [place];  Service:  Endoscopy;  Laterality: N/A;   ELECTROPHYSIOLOGIC STUDY N/A 01/04/2016   Procedure: Cardioversion;  Surgeon: Wellington Hampshire, MD;  Location: [facility];  Service: Cardiovascular;  Laterality: N/A;   ELECTROPHYSIOLOGIC STUDY N/A 02/17/2016   Procedure: A-Flutter Ablation;  Surgeon: Deboraha Sprang, MD;  Location: [place] [facility];  Service: Cardiovascular;  Laterality: N/A;   GANGLION CYST EXCISION Right 1994   wrist & back   HERNIA REPAIR Right 1994   abdominal repair with mesh   NASAL SEPTUM SURGERY  2011   Dr. [name]     Family History   Problem Relation Age of Onset   Hyperlipidemia Mother    Cancer Mother        skin cancer?   Heart disease Mother 73       MI - died in her sleep   Hyperlipidemia Father    Heart disease Father    Kidney disease Neg Hx    Prostate cancer Neg Hx    Kidney cancer Neg Hx    Bladder Cancer Neg Hx     SOCIAL HX: former smoker   Current Outpatient Medications:    albuterol (VENTOLIN HFA) 108 (90 Base) MCG/ACT inhaler, TAKE 2 PUFFS BY MOUTH EVERY 6 HOURS AS NEEDED FOR WHEEZE OR SHORTNESS OF BREATH, Disp: 8.5 each, Rfl: 1   ALPRAZolam (XANAX) 0.5 MG tablet, TAKE 1 TABLET BY MOUTH EVERY NIGHT AT BEDTIME AS NEEDED FOR ANXIETY, Disp: 20 tablet, Rfl: 0   amoxicillin-clavulanate (AUGMENTIN) 875-125 MG tablet, Take 1 tablet by mouth 2 (two) times daily., Disp: , Rfl:    atorvastatin (LIPITOR) 20 MG tablet, TAKE 1 TABLET BY MOUTH IN  THE MORNING, Disp: 90 tablet, Rfl: 3   butalbital-acetaminophen-caffeine (FIORICET) 50-325-40 MG tablet, Take 1-2 tablets by mouth every 6 (six) hours as needed for headache., Disp: 20 tablet, Rfl: 0   cyclobenzaprine (FLEXERIL) 10 MG tablet, TAKE 1 TABLET BY MOUTH 3  TIMES DAILY AS NEEDED FOR  MUSCLE SPASM(S), Disp: 90 tablet, Rfl: 0   doxycycline (VIBRAMYCIN) 100 MG capsule, Take 1 capsule (100 mg total) by mouth 2 (two) times daily for 10 days., Disp: 20 capsule, Rfl: 0   escitalopram (LEXAPRO) 10 MG tablet, TAKE 1 TABLET BY MOUTH  DAILY, Disp: 90 tablet, Rfl: 3   finasteride (PROSCAR) 5 MG tablet, TAKE 1 TABLET BY MOUTH  DAILY, Disp: 90 tablet, Rfl: 3   loratadine (CLARITIN) 10 MG tablet, Take 10 mg by mouth daily as needed for allergies., Disp: , Rfl:    losartan (COZAAR) 50 MG tablet, TAKE 1 TABLET BY MOUTH  DAILY, Disp: 90 tablet, Rfl: 3   meloxicam (MOBIC) 15 MG tablet, TAKE 1 TABLET BY MOUTH  DAILY AS NEEDED FOR PAIN, Disp: 90 tablet, Rfl: 3   metroNIDAZOLE (METROGEL) 1 % gel, Apply topically daily., Disp: 60 g, Rfl: 0   pantoprazole (PROTONIX) 40 MG tablet,  TAKE 1 TABLET BY MOUTH  DAILY, Disp: 90 tablet, Rfl: 3   tadalafil (CIALIS) 5 MG tablet, TAKE 1 TABLET BY MOUTH  DAILY, Disp: 90 tablet, Rfl: 2   triamcinolone (NASACORT) 55 MCG/ACT AERO nasal inhaler, Place 2 sprays into the nose daily as needed (allergies). , Disp: , Rfl:    TRULANCE 3 MG TABS, TAKE 1 TABLET BY MOUTH  DAILY, Disp: 90 tablet, Rfl: 3  EXAM: This was a telephone visit and thus no physical exam was completed.  ASSESSMENT AND PLAN:  Discussed the following assessment and plan:  Problem List Items Addressed This Visit  Febrile illness    Undetermined cause though this is highly suspicious for [facility] spotted fever.  His symptoms as well as his CMP findings would be consistent with [facility] spotted fever.  He did have negative titers for this and no known tick bite.  Discussed this could have been related to a virus as well.  I encouraged him to complete his course of doxycycline.  I also encouraged him to have his wife do a good skin exam to ensure that there is no attached tick.  Advised to seek medical attention if he has worsening symptoms.  He will come in early next week for follow-up lab work.       Other Visit Diagnoses     Hyponatremia    -  Primary   Relevant Orders   Comp Met (CMET)       Return if symptoms worsen or fail to improve.   I discussed the assessment and treatment plan with the patient. The patient was provided an opportunity to ask questions and all were answered. The patient agreed with the plan and demonstrated an understanding of the instructions.   The patient was advised to call back or seek an in-person evaluation if the symptoms worsen or if the condition fails to improve as anticipated.  I provided 11 minutes of non-face-to-face time during this encounter.   [name], MD

## 2020-08-28 ENCOUNTER — Telehealth: Payer: Self-pay

## 2020-08-31 ENCOUNTER — Other Ambulatory Visit: Payer: Medicare Other

## 2020-08-31 ENCOUNTER — Other Ambulatory Visit: Payer: Self-pay

## 2020-08-31 ENCOUNTER — Other Ambulatory Visit (INDEPENDENT_AMBULATORY_CARE_PROVIDER_SITE_OTHER): Payer: Medicare Other

## 2020-08-31 DIAGNOSIS — E871 Hypo-osmolality and hyponatremia: Secondary | ICD-10-CM

## 2020-08-31 LAB — COMPREHENSIVE METABOLIC PANEL
ALT: 21 U/L (ref 0–53)
AST: 18 U/L (ref 0–37)
Albumin: 4 g/dL (ref 3.5–5.2)
Alkaline Phosphatase: 66 U/L (ref 39–117)
BUN: 15 mg/dL (ref 6–23)
CO2: 31 mEq/L (ref 19–32)
Calcium: 9.6 mg/dL (ref 8.4–10.5)
Chloride: 102 mEq/L (ref 96–112)
Creatinine, Ser: 0.87 mg/dL (ref 0.40–1.50)
GFR: 91.3 mL/min (ref >60.00)
Glucose, Bld: 86 mg/dL (ref 70–99)
Potassium: 4.4 mEq/L (ref 3.5–5.1)
Sodium: 140 mEq/L (ref 135–145)
Total Bilirubin: 1 mg/dL (ref 0.2–1.2)
Total Protein: 6.4 g/dL (ref 6.0–8.3)

## 2020-08-31 NOTE — Telephone Encounter (Signed)
Created in error

## 2020-09-01 ENCOUNTER — Ambulatory Visit: Payer: Medicare Other

## 2020-10-23 ENCOUNTER — Other Ambulatory Visit: Payer: Self-pay | Admitting: Family Medicine

## 2020-10-23 DIAGNOSIS — M5442 Lumbago with sciatica, left side: Secondary | ICD-10-CM

## 2020-10-23 DIAGNOSIS — G8929 Other chronic pain: Secondary | ICD-10-CM

## 2020-11-02 ENCOUNTER — Other Ambulatory Visit: Payer: Self-pay | Admitting: Family Medicine

## 2020-11-02 DIAGNOSIS — F4323 Adjustment disorder with mixed anxiety and depressed mood: Secondary | ICD-10-CM

## 2020-11-13 ENCOUNTER — Telehealth: Payer: Self-pay

## 2020-11-13 ENCOUNTER — Other Ambulatory Visit: Payer: Self-pay

## 2020-11-13 DIAGNOSIS — R972 Elevated prostate specific antigen [PSA]: Secondary | ICD-10-CM

## 2020-11-13 NOTE — Telephone Encounter (Signed)
Called and spoke to patient about his upcoming appointment for Covid testing . Patient had a clear understanding. Nothing further needed.

## 2020-11-16 ENCOUNTER — Other Ambulatory Visit: Payer: Self-pay

## 2020-11-16 ENCOUNTER — Other Ambulatory Visit
Admission: RE | Admit: 2020-11-16 | Discharge: 2020-11-16 | Disposition: A | Discharge status: Home / Self Care | Payer: Medicare Other | Source: Ambulatory Visit | Attending: Internal Medicine | Admitting: Internal Medicine

## 2020-11-16 DIAGNOSIS — Z01812 Encounter for preprocedural laboratory examination: Secondary | ICD-10-CM | POA: Insufficient documentation

## 2020-11-16 DIAGNOSIS — Z20822 Contact with and (suspected) exposure to covid-19: Secondary | ICD-10-CM | POA: Insufficient documentation

## 2020-11-17 ENCOUNTER — Ambulatory Visit: Payer: Medicare Other | Attending: Internal Medicine

## 2020-11-17 DIAGNOSIS — R0602 Shortness of breath: Secondary | ICD-10-CM | POA: Diagnosis not present

## 2020-11-17 DIAGNOSIS — J449 Chronic obstructive pulmonary disease, unspecified: Secondary | ICD-10-CM | POA: Insufficient documentation

## 2020-11-17 LAB — SARS CORONAVIRUS 2 (TAT 6-24 HRS): SARS Coronavirus 2: NEGATIVE

## 2020-11-17 MED ORDER — ALBUTEROL SULFATE (2.5 MG/3ML) 0.083% IN NEBU
2.5000 mg | INHALATION_SOLUTION | Freq: Once | RESPIRATORY_TRACT | Status: AC
  Administered 2020-11-17: 2.5 mg via RESPIRATORY_TRACT
  Filled 2020-11-17: qty 3

## 2020-11-23 ENCOUNTER — Other Ambulatory Visit: Payer: Self-pay

## 2020-11-23 ENCOUNTER — Telehealth: Payer: Self-pay | Admitting: Internal Medicine

## 2020-11-23 ENCOUNTER — Other Ambulatory Visit: Payer: Medicare Other

## 2020-11-23 DIAGNOSIS — R972 Elevated prostate specific antigen [PSA]: Secondary | ICD-10-CM

## 2020-11-23 NOTE — Telephone Encounter (Signed)
Dr. Mortimer Fries please advise on the results of the PFT.  Pt has reviewed these on mychart but is wanting someone to explain these results.  Thanks

## 2020-11-24 LAB — PSA: Prostate Specific Ag, Serum: 1.1 ng/mL (ref 0.0–4.0)

## 2020-11-24 NOTE — Telephone Encounter (Signed)
Patient with  evidence of COPD based on breathing tests, likely from previous smoking history  Can discuss further with follow up appointment in details    I have called and spoke with pt and he is aware of KK recs.  He has scheduled appt in Nov to follow up on the PFT results.

## 2020-11-25 ENCOUNTER — Ambulatory Visit: Payer: Self-pay | Admitting: Urology

## 2020-11-30 ENCOUNTER — Other Ambulatory Visit: Payer: Self-pay

## 2020-11-30 ENCOUNTER — Encounter: Payer: Self-pay | Admitting: Internal Medicine

## 2020-11-30 ENCOUNTER — Ambulatory Visit (INDEPENDENT_AMBULATORY_CARE_PROVIDER_SITE_OTHER): Payer: Medicare Other | Admitting: Internal Medicine

## 2020-11-30 VITALS — BP 116/62 | HR 83 | Temp 97.1°F | Ht 70.0 in | Wt 195.4 lb

## 2020-11-30 DIAGNOSIS — Z23 Encounter for immunization: Secondary | ICD-10-CM

## 2020-11-30 DIAGNOSIS — G4733 Obstructive sleep apnea (adult) (pediatric): Secondary | ICD-10-CM | POA: Diagnosis not present

## 2020-11-30 DIAGNOSIS — J449 Chronic obstructive pulmonary disease, unspecified: Secondary | ICD-10-CM | POA: Diagnosis not present

## 2020-11-30 MED ORDER — ADVAIR HFA 115-21 MCG/ACT IN AERO: 2.0000 | INHALATION_SPRAY | Freq: Two times a day (BID) | RESPIRATORY_TRACT | 12 refills | Status: DC

## 2020-11-30 MED ORDER — PREDNISONE 20 MG PO TABS: 20.0000 mg | ORAL_TABLET | Freq: Every day | ORAL | 2 refills | Status: DC

## 2020-11-30 NOTE — Progress Notes (Signed)
Referring provider: [name], MD   SYNOPSIS 65 year old male seen for pulmonary consult September 2019 for sleep apnea and cough Medical history significant for A. Fib previous ablation now with newly diagnosed chronic bronchitis and COPD   TEST/EVENTS :  --Sleep study 01/05/16, AHI equals 60. Pulmonary function testing 11/2020 Moderate COPD FEV1 59% predicted Ratio is decreased Air trapping and hyperinflation  CC  Follow-up severe sleep apnea Follow-up assessment of pulmonary function test     HPI Sleep apnea based on sleep study AHI of 60 diagnosed in 2017 Patient refuses to wear CPAP Patient has tried therapy with oral devices as well Patient continues to have daytime sleepiness and fatigue Risks of untreated sleep apnea explained to patient    Referral to inspire electrical stimulation device however patient refused to undergo any type of surgical procedure to place stimulator in to his body  Patient has ongoing signs symptoms of chronic cough with mucus production chest congestion wheezing and breath and dyspnea exertion Pulmonary function testing shows FEV1 59% predicted with reduced ratio along with hyperinflation and air trapping   With ongoing wheezing and chest congestion I think patient should get some prednisone  No evidence of heart failure at this time No evidence or signs of infection at this time No respiratory distress No fevers, chills, nausea, vomiting, diarrhea No evidence of lower extremity edema No evidence hemoptysis   Pulmonary function testing reviewed in detail with the patient Copy of PFTs were given to patient  Allergies  Allergen Reactions   Other Other (See Comments)    Pollen:  Sinus infection, cough, fatigue     Immunization History  Administered Date(s) Administered   Influenza Whole 11/18/2010   Influenza,inj,Quad PF,6+ Mos 10/17/2012, 11/02/2013, 10/16/2014, 10/12/2016, 10/17/2017, 10/17/2018,  10/17/2019, 10/04/2020   Influenza-Unspecified 11/02/2011, 10/17/2012, 11/02/2013, 10/22/2015, 10/17/2019   PFIZER(Purple Top)SARS-COV-2 Vaccination 04/27/2019, 05/19/2019, 01/15/2020   Tdap 10/17/2012   Zoster Recombinat (Shingrix) 07/21/2020    Past Medical History:  Diagnosis Date   Anxiety    Arthritis    BPH (benign prostatic hyperplasia) 11/19/2013   Bulge of lumbar disc without myelopathy    L2/3 through L5/6   Bulging disc    C2/3, C3/4, C6/7   Cervical disc herniation    C4/5 and C5/6   Colon polyp 04/18/2011   Constipation 08/31/2012   Depression    Diverticulosis    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 CHADS2VASc => 0; b. on Eliquis for pending ablation     Tobacco History: Social History   Tobacco Use  Smoking Status Former   Packs/day: 1.00   Years: 20.00   Pack years: 20.00   Types: Cigarettes   Quit date: 02/08/1991   Years since quitting: 29.8  Smokeless Tobacco Never   Counseling given: Not Answered   Outpatient Medications Prior to Visit  Medication Sig Dispense Refill   albuterol (VENTOLIN HFA) 108 (90 Base) MCG/ACT inhaler TAKE 2 PUFFS BY MOUTH EVERY 6 HOURS AS NEEDED FOR WHEEZE OR SHORTNESS OF BREATH 8.5 each 1   ALPRAZolam (XANAX) 0.5 MG tablet TAKE 1 TABLET BY MOUTH  EVERY NIGHT AT BEDTIME AS NEEDED FOR ANXIETY 20 tablet 0   atorvastatin (LIPITOR) 20 MG tablet TAKE 1 TABLET BY MOUTH IN  THE MORNING 90 tablet 3   cyclobenzaprine (FLEXERIL) 10 MG tablet TAKE 1 TABLET BY MOUTH 3  TIMES DAILY AS NEEDED FOR  MUSCLE SPASM(S) 90 tablet 0   escitalopram (LEXAPRO) 10 MG tablet TAKE 1 TABLET BY MOUTH  DAILY 90 tablet 3   finasteride (PROSCAR) 5 MG  tablet TAKE 1 TABLET BY MOUTH  DAILY 90 tablet 3   loratadine (CLARITIN) 10 MG tablet Take 10 mg by mouth daily as needed for allergies.     losartan (COZAAR) 50 MG tablet TAKE 1 TABLET BY MOUTH  DAILY 90 tablet 3   meloxicam (MOBIC) 15 MG tablet TAKE 1 TABLET BY MOUTH  DAILY AS NEEDED FOR PAIN 90 tablet 3   metroNIDAZOLE (METROGEL) 1 % gel Apply topically daily. 60 g 0   pantoprazole (PROTONIX) 40 MG tablet TAKE 1 TABLET BY MOUTH  DAILY 90 tablet 3   tadalafil (CIALIS) 5 MG tablet TAKE 1 TABLET BY MOUTH  DAILY 90 tablet 2   triamcinolone (NASACORT) 55 MCG/ACT AERO nasal inhaler Place 2 sprays into the nose daily as needed (allergies).      TRULANCE 3 MG TABS TAKE 1 TABLET BY MOUTH  DAILY 90 tablet 3   amoxicillin-clavulanate (AUGMENTIN) 875-125 MG tablet Take 1 tablet by mouth 2 (two) times daily. (Patient not taking: Reported on 11/30/2020)     butalbital-acetaminophen-caffeine (FIORICET) 50-325-40 MG tablet Take 1-2 tablets by mouth every 6 (six) hours as needed for headache. (Patient not taking: Reported on 11/30/2020) 20 tablet 0   No facility-administered medications prior to visit.      Review of Systems:  Gen:  Denies  fever, sweats, chills weight loss  HEENT: Denies blurred vision, double vision, ear pain, eye pain, hearing loss, nose bleeds, sore throat Cardiac:  No dizziness, chest pain or heaviness, +chest tightness,edema, No JVD Resp:   +cough, +sputum production, +shortness of breath,+wheezing, -hemoptysis,  Other:  All other systems negative   BP 116/62 (BP Location: Left Arm, Patient Position: Sitting, Cuff Size: Large)   Pulse 83   Temp (!) 97.1 F (36.2 C) (Oral)   Ht 5\' 10"  (1.778 m)   Wt 195 lb 6.4 oz (88.6 kg)   SpO2 98%   BMI 28.04 kg/m    Physical Examination:   General Appearance: No distress  EYES PERRLA, EOM intact.   NECK Supple, No JVD Pulmonary: normal breath sounds, No wheezing.  CardiovascularNormal S1,S2.  No m/r/g.   ALL OTHER ROS ARE  NEGATIVE        65 year old white male seen today for follow-up assessment for pulmonary function testing with the setting of reactive airways disease with previous smoking history now has a diagnosis of chronic bronchitis moderate obstructive sleep apnea based on pulmonary function testing with FEV1 59% predicted With shortness of breath cough intermittent wheezing most likely related to COPD and also uncontrolled acid reflux from hiatal hernia in the setting of severe OSA  COPD Gold stage B Start prednisone therapy 20 mg daily for 7 days Start Advair HFA 115 Avoid allergens Avoid secondhand smoke Albuterol as needed  Severe OSA with AHI of 60 Patient has trialed and failed CPAP therapy and oral device therapy multiple times  Patient has been referred to inspire device for further therapy   Overweight -recommend significant weight loss -recommend changing diet  Deconditioned state -Recommend increased daily  activity and exercise    MEDICATION ADJUSTMENTS/LABS AND TESTS ORDERED: Continue albuterol as needed  Start Advair HFA 115 Prednisone 20 mg daily for 7 days Avoid secondhand smoke exposure Administer Pneumovax 23   CURRENT MEDICATIONS REVIEWED AT LENGTH WITH PATIENT TODAY   Patient satisfied with Plan of action and management. All questions answered  Follow up in 6 months  Total time spent 35 mins  Maretta Bees Patricia Pesa, M.D.  [facility]  Medical Director [place] Director [facility] Cardio-Pulmonary Department

## 2020-11-30 NOTE — Patient Instructions (Signed)
Start Advair HFA 115 Albuterol as needed Prednisone 20 mg daily for 7 days Administer Pneumovax 23

## 2020-11-30 NOTE — Addendum Note (Signed)
Addended by: Fenton Foy on: 11/30/2020 04:44 PM   Modules accepted: Orders

## 2020-12-17 ENCOUNTER — Telehealth: Payer: Self-pay | Admitting: Internal Medicine

## 2020-12-17 NOTE — Telephone Encounter (Signed)
Instructions   Return in about 6 months (around 05/31/2021). Start Advair HFA 115 Albuterol as needed Prednisone 20 mg daily for 7 days Administer Pneumovax 23        Called and spoke to patient.  Patient is questioning if he should continue daily prednisone. Per patient Sx subsided with 7 day course. He is aware of instructions and voiced his understanding.  Nothing further needed at this time.

## 2020-12-22 ENCOUNTER — Ambulatory Visit: Payer: Medicare Other | Admitting: Internal Medicine

## 2020-12-30 DIAGNOSIS — J441 Chronic obstructive pulmonary disease with (acute) exacerbation: Secondary | ICD-10-CM | POA: Diagnosis not present

## 2020-12-30 DIAGNOSIS — R0981 Nasal congestion: Secondary | ICD-10-CM | POA: Diagnosis not present

## 2020-12-30 DIAGNOSIS — Z20822 Contact with and (suspected) exposure to covid-19: Secondary | ICD-10-CM | POA: Diagnosis not present

## 2021-01-01 DIAGNOSIS — M899 Disorder of bone, unspecified: Secondary | ICD-10-CM | POA: Insufficient documentation

## 2021-01-01 DIAGNOSIS — Z789 Other specified health status: Secondary | ICD-10-CM | POA: Insufficient documentation

## 2021-01-01 DIAGNOSIS — Z79899 Other long term (current) drug therapy: Secondary | ICD-10-CM | POA: Insufficient documentation

## 2021-01-01 DIAGNOSIS — G894 Chronic pain syndrome: Secondary | ICD-10-CM | POA: Insufficient documentation

## 2021-01-01 NOTE — Progress Notes (Signed)
Patient: [name].  Service Category: E/M  Provider: Gaspar Cola, MD  DOB: 09/07/1955  DOS: 01/04/2021  Referring Provider: Sharlet Salina, MD  MRN: [id number]  Setting: Ambulatory outpatient  PCP: [name], MD  Type: New Patient  Specialty: Interventional Pain Management    Location: Office  Delivery: Face-to-face     Primary Reason(s) for Visit: Encounter for initial evaluation of one or more chronic problems (new to examiner) potentially causing chronic pain, and posing a threat to normal musculoskeletal function. (Level of risk: High) CC: Neck Pain (Bilateral ), Back Pain (Lumbar bilaterally, scoliosis ), and Knee Pain (Bilateral )  HPI  [name] is a 65 y.o. year old, male patient, who comes for the first time to our practice referred by Sharlet Salina, MD for our initial evaluation of his chronic pain. He has Cervical foraminal stenosis (C4-5, C5-6) (Left); Lipoma of back; BPH (benign prostatic hyperplasia); Hyperlipidemia; Colon polyp; Benign prostatic hyperplasia with urinary obstruction; ED (erectile dysfunction) of organic origin; OAB (overactive bladder); Adjustment disorder with mixed anxiety and depressed mood; Dyspnea; Elevated PSA; History of atrial flutter; Chronic low back pain (4th area of Pain) (Bilateral) (L>R) w/o sciatica; Right knee pain; Neck swelling; Constipation; Chronic cough; Chronic cervical radiculopathy (C7, C8) (Left); Hypertension; Elevated glucose; Excessive gas; GERD (gastroesophageal reflux disease); Sleep apnea; History of COVID-19; Bilateral leg numbness; Chronic upper extremity pain (2ry area of Pain) (Bilateral) (L>R); Chronic lower extremity pain (5th area of Pain) (Bilateral) (L>R); History of Helicobacter pylori infection; Cough; Rash; Skin cyst; Epididymal cyst; Febrile illness; OSA (obstructive sleep apnea); Chronic pain syndrome; Pharmacologic therapy; Disorder of skeletal system; Problems influencing health status; Long term  prescription benzodiazepine use; Chronic neck pain (1ry area of Pain) (Bilateral) (L>R); Cervicogenic headache (3ry area of Pain) (Bilateral); Lumbosacral radiculopathy at S1 (Left); Chronic upper back pain (6th area of Pain); Cervical facet syndrome (Bilateral); Abnormal MRI, lumbar spine (11/18/2017); DDD (degenerative disc disease), lumbar; Lumbar facet arthropathy (Multilevel) (Bilateral); Lumbar facet syndrome; Abnormal MRI, cervical spine (11/18/2017); DDD (degenerative disc disease), cervical; Cervical facet hypertrophy; and Cervical retrolisthesis of C4 over C5 on their problem list. Today he comes in for evaluation of his Neck Pain (Bilateral ), Back Pain (Lumbar bilaterally, scoliosis ), and Knee Pain (Bilateral )  Pain Assessment: Location: Left, Right Neck (see visit info for additional sites.) Radiating: neck pain goes into head causing headache.  also down the arms into fingers Onset: More than a month ago Duration: Chronic pain Quality: Constant, Discomfort, Tingling, Other (Comment), Numbness (shocking feeling in hands.) Severity: 5 /10 (subjective, self-reported pain score)  Effect on ADL: does not do much.  unable to lift much Timing: Constant Modifying factors: rest BP: (!) 150/80  HR: (!) 55  Onset and Duration: Gradual, Date of onset: 15 years, and Present longer than 3 months Cause of pain:  scoliosis Severity: Getting worse, NAS-11 at its worse: 10/10, NAS-11 at its best: 5/10, NAS-11 now: 5/10, and NAS-11 on the average: 5/10 Timing: Not influenced by the time of the day Aggravating Factors: Bending, Kneeling, Lifiting, Motion, Prolonged sitting, Prolonged standing, Squatting, Stooping , Twisting, Walking, Walking uphill, Walking downhill, and Working Alleviating Factors: Cold packs and Resting Associated Problems: Constipation, Fatigue, Inability to concentrate, Inability to control bladder (urine), Numbness, Spasms, Tingling, and Weakness Quality of Pain: Aching,  Annoying, Itching, Throbbing, Tingling, and Uncomfortable Previous Examinations or Tests: CT scan, MRI scan, X-rays, Nerve conduction test, and Orthopedic evaluation Previous Treatments: Chiropractic manipulations, Epidural steroid injections, Narcotic  medications, Physical Therapy, Strengthening exercises, and TENS  According to the patient his primary area of pain is that of the neck (posterior aspect) (Bilateral) (L>R).  The patient denies any surgeries but does admit having had an MRI not long ago.  The patient also indicates having had physical therapy for the neck pain approximately 15 years ago.  He refers being disabled since 2008.  The patient also indicated having had an attempted nerve block in the cervical region by Dr. Sharlet Salina, DO (referring physician).  The patient indicated that the procedure had to be stopped because of severe pain.  The pain is described to be constant and occasionally he will be referred towards the back of the head, the upper back, and the shoulder areas.  He indicates having crepitus in the cervical region upon movement.  The patient's secondary area pain is that of the upper extremities (Bilateral) (L>R).  He refers that in the left upper extremity he has pain that goes all the way from the neck area down to his middle finger, ring finger, and pinky finger, most of the time.  He refers that occasionally he will have pain going into the thumb and index fingers.  This pain is described as an electrical-like sensation.  He also refers having pain in both of his wrists.  In the case of the right upper extremity the pain seems to be primarily in the wrist area.  The patient's third area of pain is that of the occipital region (Bilateral).  This pain is referred towards the top of the head and what appears to be the distribution of the greater occipital nerve, bilaterally.  He denies any surgeries, physical therapy, nerve blocks, or recent x-rays.  The patient's  fourth area pain is that of the upper back, between the shoulder blades.  He refers that this pain feels like it comes from the neck area.  The patient's fifth area pain is that of the lower back (Bilateral) (L>R).  The patient denies any prior surgeries but does admit to having had fairly recent x-rays and MRIs as well as nerve blocks by Dr. Sharlet Salina.  He refers that these seem to be somewhat helpful.  The patient indicates having had physical therapy for his back approximately 15 years ago.  The patient's sixth area pain is that of the lower extremities (Bilateral) (L>R).  He refers that in the case of the right lower extremity the pain is occasional and will go through the back of the leg but does not go all the way down into his foot.  In the case of the left lower extremity the pain seems to run down the back and lateral aspect of the leg all the way down into the lateral aspect of the foot and also the bottom of the foot.  He refers that it is a combination of pain, numbness, tingling, and weakness.  This seems to follow the distribution of the S1 dermatome.  The patient indicated that he was referred to [place] for a possible cervical spinal cord stimulator trial.  However, right off the bat he indicated that he is not interested in any type of implant or medications.  He also indicated that he will continue to go to Dr. Sharlet Salina for his back problems and therefore, the way that we see this, we will be simply providing the service for the neck pain.  Since he indicated that attempting to do the procedure with Dr. Sharlet Salina was too painful, we will go  ahead and schedule his procedure with some sedation in the OR.  We will also not be doing any type of medication management evaluation since we will not be offering that.  Historic Controlled Substance Pharmacotherapy Review  PMP and historical list of controlled substances: Alprazolam 0.5 Mg Tablet  Current opioid analgesics: .   none MME/day: 0 mg/day    Historical Monitoring: The patient  reports no history of drug use. List of all UDS Test(s): No results found for: MDMA, COCAINSCRNUR, [place], [place], CANNABQUANT, THCU, [place] List of other Serum/Urine Drug Screening Test(s):  No results found for: AMPHSCRSER, BARBSCRSER, BENZOSCRSER, COCAINSCRSER, COCAINSCRNUR, PCPSCRSER, PCPQUANT, THCSCRSER, THCU, CANNABQUANT, OPIATESCRSER, OXYSCRSER, PROPOXSCRSER, ETH Historical Background Evaluation: [place] PMP: PDMP reviewed during this encounter. Online review of the past 4-monthperiod conducted.             PMP NARX Score Report:  Narcotic: 080 Sedative: 170 Stimulant: 000 [place] Department of public safety, offender search: (Editor, commissioningInformation) Non-contributory Risk Assessment Profile: Aberrant behavior: None observed or detected today Risk factors for fatal opioid overdose: None identified today PMP NARX Overdose Risk Score: 070 Fatal overdose hazard ratio (HR): Calculation deferred Non-fatal overdose hazard ratio (HR): Calculation deferred Risk of opioid abuse or dependence: 0.7-3.0% with doses ? 36 MME/day and 6.1-26% with doses ? 120 MME/day. Substance use disorder (SUD) risk level: See below Personal History of Substance Abuse (SUD-Substance use disorder):  Alcohol: Negative  Illegal Drugs: Negative  Rx Drugs: Negative  ORT Risk Level calculation: Low Risk  Opioid Risk Tool - 01/04/21 0854       Family History of Substance Abuse   Alcohol Negative    Illegal Drugs Negative    Rx Drugs Negative      Personal History of Substance Abuse   Alcohol Negative    Illegal Drugs Negative    Rx Drugs Negative      Psychological Disease   Psychological Disease Negative    Depression Negative      Total Score   Opioid Risk Tool Scoring 0    Opioid Risk Interpretation Low Risk            ORT Scoring interpretation table:  Score <3 = Low Risk for SUD  Score between 4-7 = Moderate Risk for SUD  Score >8 = High Risk for Opioid Abuse     Pharmacologic Plan: Mr. BBlazejewskiindicated having a preference to stay away from opioid analgesics.             Meds   Current Outpatient Medications:    albuterol (VENTOLIN HFA) 108 (90 Base) MCG/ACT inhaler, TAKE 2 PUFFS BY MOUTH EVERY 6 HOURS AS NEEDED FOR WHEEZE OR SHORTNESS OF BREATH, Disp: 8.5 each, Rfl: 1   ALPRAZolam (XANAX) 0.5 MG tablet, TAKE 1 TABLET BY MOUTH EVERY NIGHT AT BEDTIME AS NEEDED FOR ANXIETY, Disp: 20 tablet, Rfl: 0   atorvastatin (LIPITOR) 20 MG tablet, TAKE 1 TABLET BY MOUTH IN  THE MORNING, Disp: 90 tablet, Rfl: 3   cyclobenzaprine (FLEXERIL) 10 MG tablet, TAKE 1 TABLET BY MOUTH 3  TIMES DAILY AS NEEDED FOR  MUSCLE SPASM(S), Disp: 90 tablet, Rfl: 0   doxycycline (VIBRAMYCIN) 100 MG capsule, Take 100 mg by mouth in the morning and at bedtime., Disp: , Rfl:    escitalopram (LEXAPRO) 10 MG tablet, TAKE 1 TABLET BY MOUTH  DAILY, Disp: 90 tablet, Rfl: 3   finasteride (PROSCAR) 5 MG tablet, TAKE 1 TABLET BY MOUTH  DAILY, Disp: 90 tablet, Rfl: 3  fluticasone-salmeterol (ADVAIR HFA) 115-21 MCG/ACT inhaler, Inhale 2 puffs into the lungs 2 (two) times daily., Disp: 1 each, Rfl: 12   loratadine (CLARITIN) 10 MG tablet, Take 10 mg by mouth daily as needed for allergies., Disp: , Rfl:    losartan (COZAAR) 50 MG tablet, TAKE 1 TABLET BY MOUTH  DAILY, Disp: 90 tablet, Rfl: 3   meloxicam (MOBIC) 15 MG tablet, TAKE 1 TABLET BY MOUTH  DAILY AS NEEDED FOR PAIN, Disp: 90 tablet, Rfl: 3   pantoprazole (PROTONIX) 40 MG tablet, TAKE 1 TABLET BY MOUTH  DAILY, Disp: 90 tablet, Rfl: 3   tadalafil (CIALIS) 5 MG tablet, TAKE 1 TABLET BY MOUTH  DAILY, Disp: 90 tablet, Rfl: 2   TRULANCE 3 MG TABS, TAKE 1 TABLET BY MOUTH  DAILY, Disp: 90 tablet, Rfl: 3  Imaging Review  Cervical Imaging: Cervical MR wo contrast: Results for orders placed during the hospital encounter of 11/18/17 MR Cervical Spine Wo Contrast  Narrative CLINICAL DATA:  Neck pain, LEFT arm pain.  EXAM: MRI CERVICAL SPINE  WITHOUT CONTRAST  TECHNIQUE: Multiplanar, multisequence MR imaging of the cervical spine was performed. No intravenous contrast was administered.  COMPARISON:  Motion degraded MRI scan 09/23/2017. CT scan 02/22/2017.  FINDINGS: Today's study is also significantly motion degraded despite best efforts of the technologist. Examination is of marginal diagnostic utility.  Alignment: Anatomic  Vertebrae: No acute fracture, evidence of discitis, or bone lesion. Bold T1 compression fracture and spinous process avulsion.  Cord: No definite signal abnormality.  Slight flattening C4-C5.  Posterior Fossa, vertebral arteries, paraspinal tissues: Unremarkable.  Disc levels:  C2-3:  Normal disc space.  No impingement.  C3-4:  Disc desiccation.  No impingement.  C4-5: Disc space narrowing. Osseous spurring. Annular bulge. LEFT C5 foraminal narrowing.  C5-6: Slight disc space narrowing. Annular bulge with osseous spurring. Mild asymmetric uncinate hypertrophy uncinate hypertrophy resulting in LEFT C6 foraminal narrowing.  C6-7:  Unremarkable.  C7-T1:  Unremarkable.  IMPRESSION: Motion degraded examination is of marginal diagnostic utility.  Ordinary spondylosis at C4-5 and C5-6 on the LEFT. Correlate clinically for symptomatic LEFT C5 and/or LEFT C6 radicular symptoms.  No significant malalignment or worrisome osseous lesion. Mild stenosis at C4-5 without definite abnormal cord signal.   Electronically Signed By: Staci Righter M.D. On: 11/18/2017 14:14  Cervical CT wo contrast: Results for orders placed during the hospital encounter of 02/22/17 CT Cervical Spine Wo Contrast  Narrative CLINICAL DATA:  Head trauma with subsequent neck pain.  EXAM: CT CERVICAL SPINE WITHOUT CONTRAST  TECHNIQUE: Multidetector CT imaging of the cervical spine was performed without intravenous contrast. Multiplanar CT image reconstructions were also generated.  COMPARISON:   08/19/2016  FINDINGS: Alignment: Normal  Skull base and vertebrae: Normal. Chronic corticated bone adjacent to the tip of the spinous process of T1, not significant.  Soft tissues and spinal canal: Normal except for small thyroid nodules.  Disc levels: Mild degenerative spondylosis at C4-5 and C5-6 with small osteophytes but no significant narrowing of the canal or foramina.  Upper chest: Mild pleural and parenchymal scarring.  Other: None  IMPRESSION: No significant cervical spine finding. Ordinary mild spondylosis at C4-5 and C5-6.   Electronically Signed By: Nelson Chimes M.D. On: 02/22/2017 16:36  Thoracic Imaging: Thoracic MR wo contrast: Results for orders placed in visit on 09/06/12  Lumbosacral Imaging: Lumbar MR wo contrast: Results for orders placed during the hospital encounter of 11/18/17 MR Lumbar Spine Wo Contrast  Narrative CLINICAL DATA:  Low back  pain with LEFT leg pain.  EXAM: MRI LUMBAR SPINE WITHOUT CONTRAST  TECHNIQUE: Multiplanar, multisequence MR imaging of the lumbar spine was performed. No intravenous contrast was administered.  COMPARISON:  None.  FINDINGS: Segmentation:  Standard  Alignment: Slight degenerative scoliosis convex LEFT mid lumbar region. No subluxation.  Vertebrae:  No worrisome osseous lesion.  Conus medullaris and cauda equina: Conus extends to the L2 level. Conus and cauda equina appear normal.  Paraspinal and other soft tissues: Unremarkable. Renal cystic disease, incompletely evaluated.  Disc levels:  L1-L2:  Normal.  L2-L3:  Facet arthropathy.  No disc protrusion or impingement.  L3-L4: Disc desiccation. Annular bulge. Facet arthropathy. No disc protrusion or impingement.  L4-L5:  Facet arthropathy.  No disc protrusion or impingement.  L5-S1:  Facet arthropathy.  No disc protrusion or impingement.  IMPRESSION: Unremarkable lumbar spine MRI. No disc protrusion or neural impingement. No cause is  seen for LEFT leg symptoms.   Electronically Signed By: Staci Righter M.D. On: 11/18/2017 14:21  Complexity Note: Imaging results reviewed. Results shared with Mr. Stillinger, using Layman's terms.                        ROS  Cardiovascular: High blood pressure Pulmonary or Respiratory: Lung problems, Shortness of breath, Snoring , and Temporary stoppage of breathing during sleep Neurological: Curved spine Psychological-Psychiatric: Anxiousness Gastrointestinal: Heartburn due to stomach pushing into lungs (Hiatal hernia) and Reflux or heatburn Genitourinary: No reported renal or genitourinary signs or symptoms such as difficulty voiding or producing urine, peeing blood, non-functioning kidney, kidney stones, difficulty emptying the bladder, difficulty controlling the flow of urine, or chronic kidney disease Hematological: No reported hematological signs or symptoms such as prolonged bleeding, low or poor functioning platelets, bruising or bleeding easily, hereditary bleeding problems, low energy levels due to low hemoglobin or being anemic Endocrine: No reported endocrine signs or symptoms such as high or low blood sugar, rapid heart rate due to high thyroid levels, obesity or weight gain due to slow thyroid or thyroid disease Rheumatologic: No reported rheumatological signs and symptoms such as fatigue, joint pain, tenderness, swelling, redness, heat, stiffness, decreased range of motion, with or without associated rash Musculoskeletal: Negative for myasthenia gravis, muscular dystrophy, multiple sclerosis or malignant hyperthermia Work History: Legally disabled  Allergies  Mr. Badolato is allergic to other.  Laboratory Chemistry Profile   Renal Lab Results  Component Value Date   BUN 15 08/31/2020   CREATININE 0.87 08/31/2020   BCR 13 02/15/2016   GFR 91.30 08/31/2020   GFRAA >60 02/22/2017   GFRNONAA >60 08/20/2020   SPECGRAV 1.015 11/26/2019   PHUR 5.0 11/26/2019   PROTEINUR  Negative 11/26/2019     Electrolytes Lab Results  Component Value Date   NA 140 08/31/2020   K 4.4 08/31/2020   CL 102 08/31/2020   CALCIUM 9.6 08/31/2020   MG 2.0 11/30/2015     Hepatic Lab Results  Component Value Date   AST 18 08/31/2020   ALT 21 08/31/2020   ALBUMIN 4.0 08/31/2020   ALKPHOS 66 08/31/2020     ID Lab Results  Component Value Date   HIV Non Reactive 08/20/2020   SARSCOV2NAA NEGATIVE 11/16/2020   HCVAB NEGATIVE 09/04/2015   RMSFIGG Negative 08/20/2020     Bone Lab Results  Component Value Date   TESTOSTERONE 485 11/22/2016     Endocrine Lab Results  Component Value Date   GLUCOSE 86 08/31/2020   GLUCOSEU Negative 11/26/2019  HGBA1C 5.6 01/18/2019   TSH 0.585 11/29/2015   FREET4 1.12 03/18/2013   TESTOSTERONE 485 11/22/2016     Neuropathy Lab Results  Component Value Date   HGBA1C 5.6 01/18/2019   HIV Non Reactive 08/20/2020     CNS No results found for: COLORCSF, APPEARCSF, RBCCOUNTCSF, WBCCSF, POLYSCSF, LYMPHSCSF, EOSCSF, PROTEINCSF, GLUCCSF, JCVIRUS, CSFOLI, IGGCSF, LABACHR, ACETBL, LABACHR, ACETBL   Inflammation (CRP: Acute  ESR: Chronic) No results found for: CRP, ESRSEDRATE, LATICACIDVEN   Rheumatology No results found for: RF, ANA, LABURIC, URICUR, LYMEIGGIGMAB, LYMEABIGMQN, HLAB27   Coagulation Lab Results  Component Value Date   PLT 169 08/20/2020     Cardiovascular Lab Results  Component Value Date   TROPONINI <0.03 11/30/2015   HGB 13.6 08/20/2020   HCT 39.1 08/20/2020     Screening Lab Results  Component Value Date   SARSCOV2NAA NEGATIVE 11/16/2020   HCVAB NEGATIVE 09/04/2015   HIV Non Reactive 08/20/2020     Cancer No results found for: CEA, CA125, LABCA2   Allergens No results found for: ALMOND, APPLE, ASPARAGUS, AVOCADO, BANANA, BARLEY, BASIL, BAYLEAF, GREENBEAN, LIMABEAN, WHITEBEAN, BEEFIGE, REDBEET, BLUEBERRY, BROCCOLI, CABBAGE, MELON, CARROT, CASEIN, CASHEWNUT, CAULIFLOWER, CELERY     Note: Lab  results reviewed.  PFSH  Drug: Mr. Covelli  reports no history of drug use. Alcohol:  reports no history of alcohol use. Tobacco:  reports that he quit smoking about 29 years ago. His smoking use included cigarettes. He has a 20.00 pack-year smoking history. He has never used smokeless tobacco. Medical:  has a past medical history of Anxiety, Arthritis, BPH (benign prostatic hyperplasia) (11/19/2013), Bulge of lumbar disc without myelopathy, Bulging disc, Cervical disc herniation, Colon polyp (04/18/2011), Constipation (08/31/2012), Depression, Diverticulosis, ED (erectile dysfunction) of organic origin (01/04/2012), GERD (gastroesophageal reflux disease), H/O diverticulitis of colon, Hemorrhoid, Hiatal hernia, Hyperlipidemia, Levoscoliosis, Sleep difficulties, Spinal stenosis in cervical region, Testicular pain, left, and Typical atrial flutter ([place]) (11/2015). Family: family history includes Cancer in his mother; Heart disease in his father; Heart disease (age of onset: 94) in his mother; Hyperlipidemia in his father and mother.  Past Surgical History:  Procedure Laterality Date   ATRIAL FLUTTER ABLATION  02/17/2016   COLONOSCOPY  2014   COLONOSCOPY WITH PROPOFOL N/A 06/30/2016   Procedure: COLONOSCOPY WITH PROPOFOL;  Surgeon: Lucilla Lame, MD;  Location: [facility];  Service: Endoscopy;  Laterality: N/A;   ELECTROPHYSIOLOGIC STUDY N/A 01/04/2016   Procedure: Cardioversion;  Surgeon: Wellington Hampshire, MD;  Location: [facility];  Service: Cardiovascular;  Laterality: N/A;   ELECTROPHYSIOLOGIC STUDY N/A 02/17/2016   Procedure: A-Flutter Ablation;  Surgeon: Deboraha Sprang, MD;  Location: [facility];  Service: Cardiovascular;  Laterality: N/A;   GANGLION CYST EXCISION Right 1994   wrist & back   HERNIA REPAIR Right 1994   abdominal repair with mesh   NASAL SEPTUM SURGERY  2011   Dr. [name]    Active Ambulatory Problems    Diagnosis Date Noted   Cervical foraminal stenosis (C4-5,  C5-6) (Left) 03/08/2013   Lipoma of back 03/08/2013   BPH (benign prostatic hyperplasia) 11/19/2013   Hyperlipidemia 11/19/2013   Colon polyp 04/18/2011   Benign prostatic hyperplasia with urinary obstruction 08/23/2012   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/2012   OAB (overactive bladder) 03/31/2015   Adjustment disorder with mixed anxiety and depressed mood 07/23/2015   Dyspnea    Elevated PSA 12/24/2015   History of atrial flutter    Chronic low back pain (4th area  of Pain) (Bilateral) (L>R) w/o sciatica 03/30/2016   Right knee pain 03/30/2016   Neck swelling 05/02/2016   Constipation 05/19/2016   Chronic cough 08/12/2016   Chronic cervical radiculopathy (C7, C8) (Left) 01/23/2017   Hypertension 01/23/2017   Elevated glucose 04/24/2017   Excessive gas 11/01/2017   GERD (gastroesophageal reflux disease) 01/16/2018   Sleep apnea 01/16/2018   History of COVID-19 05/29/2018   Bilateral leg numbness 12/13/2017   Chronic upper extremity pain (2ry area of Pain) (Bilateral) (L>R) 12/13/2017   Chronic lower extremity pain (5th area of Pain) (Bilateral) (L>R) 54/10/8117   History of Helicobacter pylori infection 05/17/2019   Cough 06/04/2019   Rash 03/04/2020   Skin cyst 06/03/2020   Epididymal cyst 06/03/2020   Febrile illness 08/26/2020   OSA (obstructive sleep apnea) 04/10/2019   Chronic pain syndrome 01/01/2021   Pharmacologic therapy 01/01/2021   Disorder of skeletal system 01/01/2021   Problems influencing health status 01/01/2021   Long term prescription benzodiazepine use 01/04/2021   Chronic neck pain (1ry area of Pain) (Bilateral) (L>R) 01/04/2021   Cervicogenic headache (3ry area of Pain) (Bilateral) 01/04/2021   Lumbosacral radiculopathy at S1 (Left) 01/04/2021   Chronic upper back pain (6th area of Pain) 01/04/2021   Cervical facet syndrome (Bilateral) 01/04/2021   Abnormal MRI, lumbar spine (11/18/2017) 01/04/2021   DDD (degenerative disc disease),  lumbar 01/04/2021   Lumbar facet arthropathy (Multilevel) (Bilateral) 01/04/2021   Lumbar facet syndrome 01/04/2021   Abnormal MRI, cervical spine (11/18/2017) 01/04/2021   DDD (degenerative disc disease), cervical 01/04/2021   Cervical facet hypertrophy 01/04/2021   Cervical retrolisthesis of C4 over C5 01/04/2021   Resolved Ambulatory Problems    Diagnosis Date Noted   Routine general medical examination at a health care facility 08/31/2012   Constipation 08/31/2012   Sinusitis 10/30/2012   Medication management 10/30/2012   Sore throat 06/06/2013   Testicular pain, left 06/06/2013   Belching 11/19/2013   Cough 11/19/2013   Plantar fasciitis of left foot 12/26/2013   Generalized anxiety disorder 12/26/2013   Acute sinusitis 04/24/2014   Eye lesion 04/24/2014   Fatigue 05/01/2012   Excessive urination at night 01/04/2012   Sinusitis 12/27/2011   Infection of the upper respiratory tract 12/29/2011   FOM (frequency of micturition) 01/04/2012   Epididymitis 11/04/2014   Viral URI with cough 11/04/2014   Infection of the upper respiratory tract 12/29/2011   Pure hypercholesterolemia 04/18/2011   Nocturia 03/31/2015   Sinus infection 12/27/2011   Episodic lightheadedness 07/23/2015   Eye pain 09/04/2015   Atrial flutter ([place]) 11/29/2015   Encounter for anticoagulation discussion and counseling    Diverticulitis 05/02/2016   Abnormal CT scan 05/19/2016   Chest congestion 12/27/2011   Abnormal feces    Skin cyst 01/23/2017   Chest wall pain 03/02/2017   Past Medical History:  Diagnosis Date   Anxiety    Arthritis    Bulge of lumbar disc without myelopathy    Bulging disc    Cervical disc herniation    Depression    Diverticulosis    H/O diverticulitis of colon    Hemorrhoid    Hiatal hernia    Levoscoliosis    Sleep difficulties    Spinal stenosis in cervical region    Typical atrial flutter ([place]) 11/2015   Constitutional Exam  General appearance: Well  nourished, well developed, and well hydrated. In no apparent acute distress Vitals:   01/04/21 0848  BP: (!) 150/80  Pulse: (!) 55  Resp:  16  Temp: (!) 97.2 F (36.2 C)  TempSrc: Temporal  SpO2: 100%  Weight: 195 lb (88.5 kg)  Height: 5' 10" (1.778 m)   BMI Assessment: Estimated body mass index is 27.98 kg/m as calculated from the following:   Height as of this encounter: 5' 10" (1.778 m).   Weight as of this encounter: 195 lb (88.5 kg).  BMI interpretation table: BMI level Category Range association with higher incidence of chronic pain  <18 kg/m2 Underweight   18.5-24.9 kg/m2 Ideal body weight   25-29.9 kg/m2 Overweight Increased incidence by 20%  30-34.9 kg/m2 Obese (Class I) Increased incidence by 68%  35-39.9 kg/m2 Severe obesity (Class II) Increased incidence by 136%  >40 kg/m2 Extreme obesity (Class III) Increased incidence by 254%   Patient's current BMI Ideal Body weight  Body mass index is 27.98 kg/m. Ideal body weight: 73 kg (160 lb 15 oz) Adjusted ideal body weight: 79.2 kg (174 lb 9 oz)   BMI Readings from Last 4 Encounters:  01/04/21 27.98 kg/m  11/30/20 28.04 kg/m  08/26/20 27.41 kg/m  06/15/20 28.27 kg/m   Wt Readings from Last 4 Encounters:  01/04/21 195 lb (88.5 kg)  11/30/20 195 lb 6.4 oz (88.6 kg)  08/26/20 191 lb (86.6 kg)  06/15/20 197 lb (89.4 kg)    Psych/Mental status: Alert, oriented x 3 (person, place, & time)       Eyes: PERLA Respiratory: No evidence of acute respiratory distress  Assessment  Primary Diagnosis & Pertinent Problem List: The primary encounter diagnosis was Chronic neck pain (1ry area of Pain) (Bilateral) (L>R). Diagnoses of Cervical facet hypertrophy, Cervical facet syndrome (Bilateral), DDD (degenerative disc disease), cervical, Cervical retrolisthesis of C4 over C5, Abnormal MRI, cervical spine (11/18/2017), Chronic upper extremity pain (2ry area of Pain) (Bilateral) (L>R), Cervical foraminal stenosis (C4-5, C5-6)  (Left), Chronic cervical radiculopathy (C7, C8) (Left), Cervicogenic headache (3ry area of Pain) (Bilateral), Chronic low back pain (4th area of Pain) (Bilateral) (L>R) w/o sciatica, Lumbar facet arthropathy (Multilevel) (Bilateral), Lumbar facet syndrome, DDD (degenerative disc disease), lumbar, Chronic lower extremity pain (5th area of Pain) (Bilateral) (L>R), Lumbosacral radiculopathy at S1 (Left), Abnormal MRI, lumbar spine (11/18/2017), Chronic upper back pain (6th area of Pain), Anxiety due to invasive procedure, Chronic pain syndrome, Pharmacologic therapy, Long term prescription benzodiazepine use, Disorder of skeletal system, Problems influencing health status, and Encounter for chronic pain management were also pertinent to this visit.  Visit Diagnosis (New problems to examiner): 1. Chronic neck pain (1ry area of Pain) (Bilateral) (L>R)   2. Cervical facet hypertrophy   3. Cervical facet syndrome (Bilateral)   4. DDD (degenerative disc disease), cervical   5. Cervical retrolisthesis of C4 over C5   6. Abnormal MRI, cervical spine (11/18/2017)   7. Chronic upper extremity pain (2ry area of Pain) (Bilateral) (L>R)   8. Cervical foraminal stenosis (C4-5, C5-6) (Left)   9. Chronic cervical radiculopathy (C7, C8) (Left)   10. Cervicogenic headache (3ry area of Pain) (Bilateral)   11. Chronic low back pain (4th area of Pain) (Bilateral) (L>R) w/o sciatica   12. Lumbar facet arthropathy (Multilevel) (Bilateral)   13. Lumbar facet syndrome   14. DDD (degenerative disc disease), lumbar   15. Chronic lower extremity pain (5th area of Pain) (Bilateral) (L>R)   16. Lumbosacral radiculopathy at S1 (Left)   17. Abnormal MRI, lumbar spine (11/18/2017)   18. Chronic upper back pain (6th area of Pain)   19. Anxiety due to invasive procedure  20. Chronic pain syndrome   21. Pharmacologic therapy   22. Long term prescription benzodiazepine use   23. Disorder of skeletal system   24. Problems  influencing health status   25. Encounter for chronic pain management    Plan of Care (Initial workup plan)  Problem-specific plan: No problem-specific Assessment & Plan notes found for this encounter.  Lab Orders         Sedimentation rate         C-reactive protein     Imaging Orders         DG Cervical Spine Complete     Referral Orders  No referral(s) requested today   Procedure Orders         CERVICAL FACET (MEDIAL BRANCH NERVE BLOCK)      Pharmacotherapy (current): Medications ordered:  No orders of the defined types were placed in this encounter.  Medications administered during this visit: [name] had no medications administered during this visit.     Interventional Therapies  Risk  Complexity Considerations:   Estimated body mass index is 27.98 kg/m as calculated from the following:   Height as of this encounter: 5' 10" (1.778 m).   Weight as of this encounter: 195 lb (88.5 kg). Anxiety   Planned  Pending:   Diagnostic bilateral cervical facet MBB #1    Under consideration:   Diagnostic bilateral cervical facet MBB #1  Diagnostic left cervical ESI #1    Completed:   None at this time   Therapeutic  Palliative (PRN) options:   None established    Provider-requested follow-up: Return for (ECT) procedure: (B) C-FCT BLK #1, (Sed-anx).  Future Appointments  Date Time Provider [place]  04/21/2021  3:30 PM [place] ADVISOR [id number]    Note by: Gaspar Cola, MD Date: 01/04/2021; Time: 10:16 AM

## 2021-01-04 ENCOUNTER — Other Ambulatory Visit: Payer: Self-pay

## 2021-01-04 ENCOUNTER — Other Ambulatory Visit
Admission: RE | Admit: 2021-01-04 | Discharge: 2021-01-04 | Disposition: A | Discharge status: Home / Self Care | Payer: Medicare Other | Source: Home / Self Care | Attending: Pain Medicine | Admitting: Pain Medicine

## 2021-01-04 ENCOUNTER — Ambulatory Visit: Payer: Medicare Other | Admitting: Pain Medicine

## 2021-01-04 ENCOUNTER — Encounter: Payer: Self-pay | Admitting: Pain Medicine

## 2021-01-04 ENCOUNTER — Ambulatory Visit
Admission: RE | Admit: 2021-01-04 | Discharge: 2021-01-04 | Disposition: A | Discharge status: Home / Self Care | Payer: Medicare Other | Source: Ambulatory Visit | Attending: Pain Medicine | Admitting: Pain Medicine

## 2021-01-04 ENCOUNTER — Ambulatory Visit
Admission: RE | Admit: 2021-01-04 | Discharge: 2021-01-04 | Disposition: A | Discharge status: Home / Self Care | Payer: Medicare Other | Attending: Pain Medicine | Admitting: Pain Medicine

## 2021-01-04 VITALS — BP 150/80 | HR 55 | Temp 97.2°F | Resp 16 | Ht 70.0 in | Wt 195.0 lb

## 2021-01-04 DIAGNOSIS — M549 Dorsalgia, unspecified: Secondary | ICD-10-CM

## 2021-01-04 DIAGNOSIS — M4310 Spondylolisthesis, site unspecified: Secondary | ICD-10-CM

## 2021-01-04 DIAGNOSIS — M79602 Pain in left arm: Secondary | ICD-10-CM | POA: Insufficient documentation

## 2021-01-04 DIAGNOSIS — M542 Cervicalgia: Secondary | ICD-10-CM | POA: Diagnosis not present

## 2021-01-04 DIAGNOSIS — M79601 Pain in right arm: Secondary | ICD-10-CM | POA: Diagnosis not present

## 2021-01-04 DIAGNOSIS — M545 Low back pain, unspecified: Secondary | ICD-10-CM | POA: Diagnosis not present

## 2021-01-04 DIAGNOSIS — F419 Anxiety disorder, unspecified: Secondary | ICD-10-CM | POA: Insufficient documentation

## 2021-01-04 DIAGNOSIS — Z789 Other specified health status: Secondary | ICD-10-CM

## 2021-01-04 DIAGNOSIS — M899 Disorder of bone, unspecified: Secondary | ICD-10-CM | POA: Diagnosis not present

## 2021-01-04 DIAGNOSIS — M5417 Radiculopathy, lumbosacral region: Secondary | ICD-10-CM | POA: Diagnosis not present

## 2021-01-04 DIAGNOSIS — G8929 Other chronic pain: Secondary | ICD-10-CM

## 2021-01-04 DIAGNOSIS — M5136 Other intervertebral disc degeneration, lumbar region: Secondary | ICD-10-CM | POA: Insufficient documentation

## 2021-01-04 DIAGNOSIS — Z79899 Other long term (current) drug therapy: Secondary | ICD-10-CM | POA: Diagnosis not present

## 2021-01-04 DIAGNOSIS — M4802 Spinal stenosis, cervical region: Secondary | ICD-10-CM | POA: Insufficient documentation

## 2021-01-04 DIAGNOSIS — G4486 Cervicogenic headache: Secondary | ICD-10-CM | POA: Diagnosis not present

## 2021-01-04 DIAGNOSIS — M5030 Other cervical disc degeneration, unspecified cervical region: Secondary | ICD-10-CM | POA: Diagnosis not present

## 2021-01-04 DIAGNOSIS — M5412 Radiculopathy, cervical region: Secondary | ICD-10-CM | POA: Diagnosis not present

## 2021-01-04 DIAGNOSIS — M47812 Spondylosis without myelopathy or radiculopathy, cervical region: Secondary | ICD-10-CM | POA: Insufficient documentation

## 2021-01-04 DIAGNOSIS — M47816 Spondylosis without myelopathy or radiculopathy, lumbar region: Secondary | ICD-10-CM | POA: Insufficient documentation

## 2021-01-04 DIAGNOSIS — R937 Abnormal findings on diagnostic imaging of other parts of musculoskeletal system: Secondary | ICD-10-CM | POA: Insufficient documentation

## 2021-01-04 DIAGNOSIS — M79604 Pain in right leg: Secondary | ICD-10-CM

## 2021-01-04 DIAGNOSIS — M79605 Pain in left leg: Secondary | ICD-10-CM | POA: Insufficient documentation

## 2021-01-04 DIAGNOSIS — M2578 Osteophyte, vertebrae: Secondary | ICD-10-CM | POA: Diagnosis not present

## 2021-01-04 DIAGNOSIS — G894 Chronic pain syndrome: Secondary | ICD-10-CM

## 2021-01-04 LAB — SEDIMENTATION RATE: Sed Rate: 8 mm/hr (ref 0–20)

## 2021-01-04 LAB — C-REACTIVE PROTEIN: CRP: 1.2 mg/dL — ABNORMAL HIGH (ref <1.0)

## 2021-01-04 NOTE — Patient Instructions (Addendum)
______________________________________________________________________  Preparing for Procedure with Sedation  NOTICE: Due to recent regulatory changes, starting on September 07, 2020, procedures requiring intravenous (IV) sedation will no longer be performed at the [facility].  These types of procedures are required to be performed at [facility] ambulatory surgery facility.  We are very sorry for the inconvenience.  Procedure appointments are limited to planned procedures: No Prescription Refills. No disability issues will be discussed. No medication changes will be discussed.  Instructions: Oral Intake: Do not eat or drink anything for at least 8 hours prior to your procedure. (Exception: Blood Pressure Medication. See below.) Transportation: A driver is required. You may not drive yourself after the procedure. Blood Pressure Medicine: Do not forget to take your blood pressure medicine with a sip of water the morning of the procedure. If your Diastolic (lower reading) is above 100 mmHg, elective cases will be cancelled/rescheduled. Blood thinners: These will need to be stopped for procedures. Notify our staff if you are taking any blood thinners. Depending on which one you take, there will be specific instructions on how and when to stop it. Diabetics on insulin: Notify the staff so that you can be scheduled 1st case in the morning. If your diabetes requires high dose insulin, take only  of your normal insulin dose the morning of the procedure and notify the staff that you have done so. Preventing infections: Shower with an antibacterial soap the morning of your procedure. Build-up your immune system: Take 1000 mg of Vitamin C with every meal (3 times a day) the day prior to your procedure. Antibiotics: Inform the staff if you have a condition or reason that requires you to take antibiotics before dental procedures. Pregnancy: If you are pregnant, call and cancel the procedure. Sickness: If  you have a cold, fever, or any active infections, call and cancel the procedure. Arrival: You must be in the facility at least 30 minutes prior to your scheduled procedure. Children: Do not bring children with you. Dress appropriately: Bring dark clothing that you would not mind if they get stained. Valuables: Do not bring any jewelry or valuables.  Reasons to call and reschedule or cancel your procedure: (Following these recommendations will minimize the risk of a serious complication.) Surgeries: Avoid having procedures within 2 weeks of any surgery. (Avoid for 2 weeks before or after any surgery). Flu Shots: Avoid having procedures within 2 weeks of a flu shots. (Avoid for 2 weeks before or after immunizations). Barium: Avoid having a procedure within 7-10 days after having had a radiological study involving the use of radiological contrast. (Myelograms, Barium swallow or enema study). Heart attacks: Avoid any elective procedures or surgeries for the initial 6 months after a "Myocardial Infarction" (Heart Attack). Blood thinners: It is imperative that you stop these medications before procedures. Let us know if you if you take any blood thinner.  Infection: Avoid procedures during or within two weeks of an infection (including chest colds or gastrointestinal problems). Symptoms associated with infections include: Localized redness, fever, chills, night sweats or profuse sweating, burning sensation when voiding, cough, congestion, stuffiness, runny nose, sore throat, diarrhea, nausea, vomiting, cold or Flu symptoms, recent or current infections. It is specially important if the infection is over the area that we intend to treat. Heart and lung problems: Symptoms that may suggest an active cardiopulmonary problem include: cough, chest pain, breathing difficulties or shortness of breath, dizziness, ankle swelling, uncontrolled high or unusually low blood pressure, and/or palpitations. If you are  experiencing any of these symptoms, cancel your procedure and contact your primary care physician for an evaluation.  Remember:  Regular Business hours are:  Monday to Thursday 8:00 AM to 4:00 PM  Provider's Schedule: Francisco Naveira, MD:  Procedure days: Tuesday and Thursday 7:30 AM to 4:00 PM  Bilal Lateef, MD:  Procedure days: Monday and Wednesday 7:30 AM to 4:00 PM ______________________________________________________________________  ____________________________________________________________________________________________  General Risks and Possible Complications  Patient Responsibilities: It is important that you read this as it is part of your informed consent. It is our duty to inform you of the risks and possible complications associated with treatments offered to you. It is your responsibility as a patient to read this and to ask questions about anything that is not clear or that you believe was not covered in this document.  Patient's Rights: You have the right to refuse treatment. You also have the right to change your mind, even after initially having agreed to have the treatment done. However, under this last option, if you wait until the last second to change your mind, you may be charged for the materials used up to that point.  Introduction: Medicine is not an exact science. Everything in Medicine, including the lack of treatment(s), carries the potential for danger, harm, or loss (which is by definition: Risk). In Medicine, a complication is a secondary problem, condition, or disease that can aggravate an already existing one. All treatments carry the risk of possible complications. The fact that a side effects or complications occurs, does not imply that the treatment was conducted incorrectly. It must be clearly understood that these can happen even when everything is done following the highest safety standards.  No treatment: You can choose not to proceed with the  proposed treatment alternative. The "PRO(s)" would include: avoiding the risk of complications associated with the therapy. The "CON(s)" would include: not getting any of the treatment benefits. These benefits fall under one of three categories: diagnostic; therapeutic; and/or palliative. Diagnostic benefits include: getting information which can ultimately lead to improvement of the disease or symptom(s). Therapeutic benefits are those associated with the successful treatment of the disease. Finally, palliative benefits are those related to the decrease of the primary symptoms, without necessarily curing the condition (example: decreasing the pain from a flare-up of a chronic condition, such as incurable terminal cancer).  General Risks and Complications: These are associated to most interventional treatments. They can occur alone, or in combination. They fall under one of the following six (6) categories: no benefit or worsening of symptoms; bleeding; infection; nerve damage; allergic reactions; and/or death. No benefits or worsening of symptoms: In Medicine there are no guarantees, only probabilities. No healthcare provider can ever guarantee that a medical treatment will work, they can only state the probability that it may. Furthermore, there is always the possibility that the condition may worsen, either directly, or indirectly, as a consequence of the treatment. Bleeding: This is more common if the patient is taking a blood thinner, either prescription or over the counter (example: Goody Powders, Fish oil, Aspirin, Garlic, etc.), or if suffering a condition associated with impaired coagulation (example: Hemophilia, cirrhosis of the liver, low platelet counts, etc.). However, even if you do not have one on these, it can still happen. If you have any of these conditions, or take one of these drugs, make sure to notify your treating physician. Infection: This is more common in patients with a compromised  immune system, either due to disease (example:   diabetes, cancer, human immunodeficiency virus [HIV], etc.), or due to medications or treatments (example: therapies used to treat cancer and rheumatological diseases). However, even if you do not have one on these, it can still happen. If you have any of these conditions, or take one of these drugs, make sure to notify your treating physician. Nerve Damage: This is more common when the treatment is an invasive one, but it can also happen with the use of medications, such as those used in the treatment of cancer. The damage can occur to small secondary nerves, or to large primary ones, such as those in the spinal cord and brain. This damage may be temporary or permanent and it may lead to impairments that can range from temporary numbness to permanent paralysis and/or brain death. Allergic Reactions: Any time a substance or material comes in contact with our body, there is the possibility of an allergic reaction. These can range from a mild skin rash (contact dermatitis) to a severe systemic reaction (anaphylactic reaction), which can result in death. Death: In general, any medical intervention can result in death, most of the time due to an unforeseen complication. ____________________________________________________________________________________________ Pain Management Discharge Instructions  General Discharge Instructions :  If you need to reach your doctor call: Monday-Friday 8:00 am - 4:00 pm at [phone] or toll free [phone].  After clinic hours [phone] to have operator reach doctor.  Bring all of your medication bottles to all your appointments in the pain clinic.  To cancel or reschedule your appointment with Pain Management please remember to call 24 hours in advance to avoid a fee.  Refer to the educational materials which you have been given on: General Risks, I had my Procedure. Discharge Instructions, Post Sedation.  Post  Procedure Instructions:  The drugs you were given will stay in your system until tomorrow, so for the next 24 hours you should not drive, make any legal decisions or drink any alcoholic beverages.  You may eat anything you prefer, but it is better to start with liquids then soups and crackers, and gradually work up to solid foods.  Please notify your doctor immediately if you have any unusual bleeding, trouble breathing or pain that is not related to your normal pain.  Depending on the type of procedure that was done, some parts of your body may feel week and/or numb.  This usually clears up by tonight or the next day.  Walk with the use of an assistive device or accompanied by an adult for the 24 hours.  You may use ice on the affected area for the first 24 hours.  Put ice in a Ziploc bag and cover with a towel and place against area 15 minutes on 15 minutes off.  You may switch to heat after 24 hours.GENERAL RISKS AND COMPLICATIONS  What are the risk, side effects and possible complications? Generally speaking, most procedures are safe.  However, with any procedure there are risks, side effects, and the possibility of complications.  The risks and complications are dependent upon the sites that are lesioned, or the type of nerve block to be performed.  The closer the procedure is to the spine, the more serious the risks are.  Great care is taken when placing the radio frequency needles, block needles or lesioning probes, but sometimes complications can occur. Infection: Any time there is an injection through the skin, there is a risk of infection.  This is why sterile conditions are used for these blocks.  There  are four possible types of infection. Localized skin infection. Central Nervous System Infection-This can be in the form of Meningitis, which can be deadly. Epidural Infections-This can be in the form of an epidural abscess, which can cause pressure inside of the spine, causing  compression of the spinal cord with subsequent paralysis. This would require an emergency surgery to decompress, and there are no guarantees that the patient would recover from the paralysis. Discitis-This is an infection of the intervertebral discs.  It occurs in about 1% of discography procedures.  It is difficult to treat and it may lead to surgery.        2. Pain: the needles have to go through skin and soft tissues, will cause soreness.       3. Damage to internal structures:  The nerves to be lesioned may be near blood vessels or    other nerves which can be potentially damaged.       4. Bleeding: Bleeding is more common if the patient is taking blood thinners such as  aspirin, Coumadin, Ticiid, Plavix, etc., or if he/she have some genetic predisposition  such as hemophilia. Bleeding into the spinal canal can cause compression of the spinal  cord with subsequent paralysis.  This would require an emergency surgery to  decompress and there are no guarantees that the patient would recover from the  paralysis.       5. Pneumothorax:  Puncturing of a lung is a possibility, every time a needle is introduced in  the area of the chest or upper back.  Pneumothorax refers to free air around the  collapsed lung(s), inside of the thoracic cavity (chest cavity).  Another two possible  complications related to a similar event would include: Hemothorax and Chylothorax.   These are variations of the Pneumothorax, where instead of air around the collapsed  lung(s), you may have blood or chyle, respectively.       6. Spinal headaches: They may occur with any procedures in the area of the spine.       7. Persistent CSF (Cerebro-Spinal Fluid) leakage: This is a rare problem, but may occur  with prolonged intrathecal or epidural catheters either due to the formation of a fistulous  track or a dural tear.       8. Nerve damage: By working so close to the spinal cord, there is always a possibility of  nerve damage, which  could be as serious as a permanent spinal cord injury with  paralysis.       9. Death:  Although rare, severe deadly allergic reactions known as "Anaphylactic  reaction" can occur to any of the medications used.      10. Worsening of the symptoms:  We can always make thing worse.  What are the chances of something like this happening? Chances of any of this occuring are extremely low.  By statistics, you have more of a chance of getting killed in a motor vehicle accident: while driving to the hospital than any of the above occurring .  Nevertheless, you should be aware that they are possibilities.  In general, it is similar to taking a shower.  Everybody knows that you can slip, hit your head and get killed.  Does that mean that you should not shower again?  Nevertheless always keep in mind that statistics do not mean anything if you happen to be on the wrong side of them.  Even if a procedure has a 1 (one) in a 1,000,000 (  million) chance of going wrong, it you happen to be that one..Also, keep in mind that by statistics, you have more of a chance of having something go wrong when taking medications.  Who should not have this procedure? If you are on a blood thinning medication (e.g. Coumadin, Plavix, see list of "Blood Thinners"), or if you have an active infection going on, you should not have the procedure.  If you are taking any blood thinners, please inform your physician.  How should I prepare for this procedure? Do not eat or drink anything at least six hours prior to the procedure. Bring a driver with you .  It cannot be a taxi. Come accompanied by an adult that can drive you back, and that is strong enough to help you if your legs get weak or numb from the local anesthetic. Take all of your medicines the morning of the procedure with just enough water to swallow them. If you have diabetes, make sure that you are scheduled to have your procedure done first thing in the morning, whenever  possible. If you have diabetes, take only half of your insulin dose and notify our nurse that you have done so as soon as you arrive at the clinic. If you are diabetic, but only take blood sugar pills (oral hypoglycemic), then do not take them on the morning of your procedure.  You may take them after you have had the procedure. Do not take aspirin or any aspirin-containing medications, at least eleven (11) days prior to the procedure.  They may prolong bleeding. Wear loose fitting clothing that may be easy to take off and that you would not mind if it got stained with Betadine or blood. Do not wear any jewelry or perfume Remove any nail coloring.  It will interfere with some of our monitoring equipment.  NOTE: Remember that this is not meant to be interpreted as a complete list of all possible complications.  Unforeseen problems may occur.  BLOOD THINNERS The following drugs contain aspirin or other products, which can cause increased bleeding during surgery and should not be taken for 2 weeks prior to and 1 week after surgery.  If you should need take something for relief of minor pain, you may take acetaminophen which is found in Tylenol,m Datril, Anacin-3 and Panadol. It is not blood thinner. The products listed below are.  Do not take any of the products listed below in addition to any listed on your instruction sheet.  A.P.C or A.P.C with Codeine Codeine Phosphate Capsules #3 Ibuprofen Ridaura  ABC compound Congesprin Imuran rimadil  Advil Cope Indocin Robaxisal  Alka-Seltzer Effervescent Pain Reliever and Antacid Coricidin or Coricidin-D  Indomethacin Rufen  Alka-Seltzer plus Cold Medicine Cosprin Ketoprofen S-A-C Tablets  Anacin Analgesic Tablets or Capsules Coumadin Korlgesic Salflex  Anacin Extra Strength Analgesic tablets or capsules CP-2 Tablets Lanoril Salicylate  Anaprox Cuprimine Capsules Levenox Salocol  Anexsia-D Dalteparin Magan Salsalate  Anodynos Darvon compound Magnesium  Salicylate Sine-off  Ansaid Dasin Capsules Magsal Sodium Salicylate  Anturane Depen Capsules Marnal Soma  APF Arthritis pain formula Dewitt's Pills Measurin Stanback  Argesic Dia-Gesic Meclofenamic Sulfinpyrazone  Arthritis Bayer Timed Release Aspirin Diclofenac Meclomen Sulindac  Arthritis pain formula Anacin Dicumarol Medipren Supac  Analgesic (Safety coated) Arthralgen Diffunasal Mefanamic Suprofen  Arthritis Strength Bufferin Dihydrocodeine Mepro Compound Suprol  Arthropan liquid Dopirydamole Methcarbomol with Aspirin Synalgos  ASA tablets/Enseals Disalcid Micrainin Tagament  Ascriptin Doan's Midol Talwin  Ascriptin A/D Dolene Mobidin Tanderil  Ascriptin Extra Strength  Dolobid Moblgesic Ticlid  Ascriptin with Codeine Doloprin or Doloprin with Codeine Momentum Tolectin  Asperbuf Duoprin Mono-gesic Trendar  Aspergum Duradyne Motrin or Motrin IB Triminicin  Aspirin plain, buffered or enteric coated Durasal Myochrisine Trigesic  Aspirin Suppositories Easprin Nalfon Trillsate  Aspirin with Codeine Ecotrin Regular or Extra Strength Naprosyn Uracel  Atromid-S Efficin Naproxen Ursinus  Auranofin Capsules Elmiron Neocylate Vanquish  Axotal Emagrin Norgesic Verin  Azathioprine Empirin or Empirin with Codeine Normiflo Vitamin E  Azolid Emprazil Nuprin Voltaren  Bayer Aspirin plain, buffered or children's or timed BC Tablets or powders Encaprin Orgaran Warfarin Sodium  Buff-a-Comp Enoxaparin Orudis Zorpin  Buff-a-Comp with Codeine Equegesic Os-Cal-Gesic   Buffaprin Excedrin plain, buffered or Extra Strength Oxalid   Bufferin Arthritis Strength Feldene Oxphenbutazone   Bufferin plain or Extra Strength Feldene Capsules Oxycodone with Aspirin   Bufferin with Codeine Fenoprofen Fenoprofen Pabalate or Pabalate-SF   Buffets II Flogesic Panagesic   Buffinol plain or Extra Strength Florinal or Florinal with Codeine Panwarfarin   Buf-Tabs Flurbiprofen Penicillamine   Butalbital Compound Four-way  cold tablets Penicillin   Butazolidin Fragmin Pepto-Bismol   Carbenicillin Geminisyn Percodan   Carna Arthritis Reliever Geopen Persantine   Carprofen Gold's salt Persistin   Chloramphenicol Goody's Phenylbutazone   Chloromycetin Haltrain Piroxlcam   Clmetidine heparin Plaquenil   Cllnoril Hyco-pap Ponstel   Clofibrate Hydroxy chloroquine Propoxyphen         Before stopping any of these medications, be sure to consult the physician who ordered them.  Some, such as Coumadin (Warfarin) are ordered to prevent or treat serious conditions such as "deep thrombosis", "pumonary embolisms", and other heart problems.  The amount of time that you may need off of the medication may also vary with the medication and the reason for which you were taking it.  If you are taking any of these medications, please make sure you notify your pain physician before you undergo any procedures.         Facet Blocks Patient Information  Description: The facets are joints in the spine between the vertebrae.  Like any joints in the body, facets can become irritated and painful.  Arthritis can also effect the facets.  By injecting steroids and local anesthetic in and around these joints, we can temporarily block the nerve supply to them.  Steroids act directly on irritated nerves and tissues to reduce selling and inflammation which often leads to decreased pain.  Facet blocks may be done anywhere along the spine from the neck to the low back depending upon the location of your pain.   After numbing the skin with local anesthetic (like Novocaine), a small needle is passed onto the facet joints under x-ray guidance.  You may experience a sensation of pressure while this is being done.  The entire block usually lasts about 15-25 minutes.   Conditions which may be treated by facet blocks:  Low back/buttock pain Neck/shoulder pain Certain types of headaches  Preparation for the injection:  Do not eat any solid food  or dairy products within 8 hours of your appointment. You may drink clear liquid up to 3 hours before appointment.  Clear liquids include water, black coffee, juice or soda.  No milk or cream please. You may take your regular medication, including pain medications, with a sip of water before your appointment.  Diabetics should hold regular insulin (if taken separately) and take 1/2 normal NPH dose the morning of the procedure.  Carry some sugar containing items with you to  your appointment. A driver must accompany you and be prepared to drive you home after your procedure. Bring all your current medications with you. An IV may be inserted and sedation may be given at the discretion of the physician. A blood pressure cuff, EKG and other monitors will often be applied during the procedure.  Some patients may need to have extra oxygen administered for a short period. You will be asked to provide medical information, including your allergies and medications, prior to the procedure.  We must know immediately if you are taking blood thinners (like Coumadin/Warfarin) or if you are allergic to IV iodine contrast (dye).  We must know if you could possible be pregnant.  Possible side-effects:  Bleeding from needle site Infection (rare, may require surgery) Nerve injury (rare) Numbness & tingling (temporary) Difficulty urinating (rare, temporary) Spinal headache (a headache worse with upright posture) Light-headedness (temporary) Pain at injection site (serveral days) Decreased blood pressure (rare, temporary) Weakness in arm/leg (temporary) Pressure sensation in back/neck (temporary)   Call if you experience:  Fever/chills associated with headache or increased back/neck pain Headache worsened by an upright position New onset, weakness or numbness of an extremity below the injection site Hives or difficulty breathing (go to the emergency room) Inflammation or drainage at the injection  site(s) Severe back/neck pain greater than usual New symptoms which are concerning to you  Please note:  Although the local anesthetic injected can often make your back or neck feel good for several hours after the injection, the pain will likely return. It takes 3-7 days for steroids to work.  You may not notice any pain relief for at least one week.  If effective, we will often do a series of 2-3 injections spaced 3-6 weeks apart to maximally decrease your pain.  After the initial series, you may be a candidate for a more permanent nerve block of the facets.  If you have any questions, please call #[phone]) [facility]

## 2021-01-04 NOTE — Progress Notes (Signed)
Safety precautions to be maintained throughout the outpatient stay will include: orient to surroundings, keep bed in low position, maintain call bell within reach at all times, provide assistance with transfer out of bed and ambulation.  

## 2021-01-19 ENCOUNTER — Other Ambulatory Visit: Payer: Self-pay

## 2021-01-19 ENCOUNTER — Ambulatory Visit
Admission: RE | Admit: 2021-01-19 | Discharge: 2021-01-19 | Disposition: A | Discharge status: Home / Self Care | Payer: Medicare Other | Source: Ambulatory Visit | Attending: Pain Medicine | Admitting: Pain Medicine

## 2021-01-19 ENCOUNTER — Ambulatory Visit (HOSPITAL_BASED_OUTPATIENT_CLINIC_OR_DEPARTMENT_OTHER): Payer: Medicare Other | Admitting: Pain Medicine

## 2021-01-19 ENCOUNTER — Encounter: Payer: Self-pay | Admitting: Pain Medicine

## 2021-01-19 VITALS — BP 123/86 | HR 70 | Temp 97.3°F | Resp 18 | Ht 70.0 in | Wt 192.0 lb

## 2021-01-19 DIAGNOSIS — M4310 Spondylolisthesis, site unspecified: Secondary | ICD-10-CM | POA: Insufficient documentation

## 2021-01-19 DIAGNOSIS — M5030 Other cervical disc degeneration, unspecified cervical region: Secondary | ICD-10-CM | POA: Insufficient documentation

## 2021-01-19 DIAGNOSIS — M542 Cervicalgia: Secondary | ICD-10-CM | POA: Insufficient documentation

## 2021-01-19 DIAGNOSIS — G4486 Cervicogenic headache: Secondary | ICD-10-CM | POA: Diagnosis not present

## 2021-01-19 DIAGNOSIS — M47812 Spondylosis without myelopathy or radiculopathy, cervical region: Secondary | ICD-10-CM

## 2021-01-19 MED ORDER — DEXAMETHASONE SODIUM PHOSPHATE 10 MG/ML IJ SOLN
20.0000 mg | Freq: Once | INTRAMUSCULAR | Status: AC
  Administered 2021-01-19: 20 mg

## 2021-01-19 MED ORDER — LIDOCAINE HCL 2 % IJ SOLN
INTRAMUSCULAR | Status: AC
  Filled 2021-01-19: qty 20

## 2021-01-19 MED ORDER — FENTANYL CITRATE (PF) 100 MCG/2ML IJ SOLN
INTRAMUSCULAR | Status: AC
  Filled 2021-01-19: qty 2

## 2021-01-19 MED ORDER — MIDAZOLAM HCL 5 MG/5ML IJ SOLN
0.5000 mg | Freq: Once | INTRAMUSCULAR | Status: AC
  Administered 2021-01-19: 3 mg via INTRAVENOUS

## 2021-01-19 MED ORDER — LIDOCAINE HCL 2 % IJ SOLN
20.0000 mL | Freq: Once | INTRAMUSCULAR | Status: AC
  Administered 2021-01-19: 200 mg

## 2021-01-19 MED ORDER — LACTATED RINGERS IV SOLN: 1000.0000 mL | Freq: Once | INTRAVENOUS | Status: DC

## 2021-01-19 MED ORDER — DEXAMETHASONE SODIUM PHOSPHATE 10 MG/ML IJ SOLN
INTRAMUSCULAR | Status: AC
  Filled 2021-01-19: qty 2

## 2021-01-19 MED ORDER — MIDAZOLAM HCL 5 MG/5ML IJ SOLN
INTRAMUSCULAR | Status: AC
  Filled 2021-01-19: qty 5

## 2021-01-19 MED ORDER — ROPIVACAINE HCL 2 MG/ML IJ SOLN
18.0000 mL | Freq: Once | INTRAMUSCULAR | Status: AC
  Administered 2021-01-19: 18 mL via PERINEURAL

## 2021-01-19 MED ORDER — ROPIVACAINE HCL 2 MG/ML IJ SOLN
INTRAMUSCULAR | Status: AC
  Filled 2021-01-19: qty 20

## 2021-01-19 MED ORDER — FENTANYL CITRATE (PF) 100 MCG/2ML IJ SOLN
25.0000 ug | INTRAMUSCULAR | Status: DC | PRN
  Administered 2021-01-19: 50 ug via INTRAVENOUS

## 2021-01-19 NOTE — Patient Instructions (Signed)

## 2021-01-19 NOTE — Progress Notes (Signed)
PROVIDER NOTE: Information contained herein reflects review and annotations entered in association with encounter. Interpretation of such information and data should be left to medically-trained personnel. Information provided to patient can be located elsewhere in the medical record under "Patient Instructions". Document created using STT-dictation technology, any transcriptional errors that may result from process are unintentional.    Patient: [name].  Service Category: E/M  Provider: Gaspar Cola, MD  DOB: Feb 07, 1956  DOS: 01/19/2021  Specialty: Interventional Pain Management  MRN: [id number]  Setting: Ambulatory outpatient  PCP: [name], MD  Type: Established Patient    Referring Provider: [name], MD  Location: Office  Delivery: Face-to-face     Primary Reason(s) for Visit: Encounter for evaluation before starting new chronic pain management plan of care (Level of risk: moderate) CC: Neck Pain  HPI  [name] is a 65 y.o. year old, male patient, who comes today for a follow-up evaluation to review the test results and decide on a treatment plan. He has Cervical foraminal stenosis (C4-5, C5-6) (Left); Lipoma of back; BPH (benign prostatic hyperplasia); Hyperlipidemia; Colon polyp; Benign prostatic hyperplasia with urinary obstruction; ED (erectile dysfunction) of organic origin; OAB (overactive bladder); Adjustment disorder with mixed anxiety and depressed mood; Dyspnea; Elevated PSA; History of atrial flutter; Chronic low back pain (4th area of Pain) (Bilateral) (L>R) w/o sciatica; Right knee pain; Neck swelling; Constipation; Chronic cough; Chronic cervical radiculopathy (C7, C8) (Left); Hypertension; Elevated glucose; Excessive gas; GERD (gastroesophageal reflux disease); Sleep apnea; History of COVID-19; Bilateral leg numbness; Chronic upper extremity pain (2ry area of Pain) (Bilateral) (L>R); Chronic lower extremity pain (5th area of Pain) (Bilateral) (L>R);  History of Helicobacter pylori infection; Cough; Rash; Skin cyst; Epididymal cyst; Febrile illness; OSA (obstructive sleep apnea); Chronic pain syndrome; Pharmacologic therapy; Disorder of skeletal system; Problems influencing health status; Long term prescription benzodiazepine use; Chronic neck pain (1ry area of Pain) (Bilateral) (L>R); Cervicogenic headache (3ry area of Pain) (Bilateral); Lumbosacral radiculopathy at S1 (Left); Chronic upper back pain (6th area of Pain); Cervical facet syndrome (Bilateral); Abnormal MRI, lumbar spine (11/18/2017); DDD (degenerative disc disease), lumbar; Lumbar facet arthropathy (Multilevel) (Bilateral); Lumbar facet syndrome; Abnormal MRI, cervical spine (11/18/2017); DDD (degenerative disc disease), cervical; Cervical facet hypertrophy; Cervical retrolisthesis of C4 over C5; Cervicalgia; and Spondylosis without myelopathy or radiculopathy, cervical region on their problem list. His primarily concern today is the Neck Pain  Pain Assessment: Location: Right, Left Neck Radiating: left is worse, numbness radiates to wrists Onset: More than a month ago Duration: Chronic pain Quality: Constant, Numbness, Discomfort, Tingling Severity: 6 /10 (subjective, self-reported pain score)  Effect on ADL: limits Timing: Constant Modifying factors: rest BP: 123/86  HR: 70  [name] comes in today for a follow-up visit after his initial evaluation on 01/04/2021. Today we went over the results of his tests. These were explained in "Layman's terms". During today's appointment we went over my diagnostic impression, as well as the proposed treatment plan.  Review of initial evaluation: "According to the patient his primary area of pain is that of the neck (posterior aspect) (Bilateral) (L>R).  The patient denies any surgeries but does admit having had an MRI not long ago.  The patient also indicates having had physical therapy for the neck pain approximately 15 years ago.  He refers  being disabled since 2008.  The patient also indicated having had an attempted nerve block in the cervical region by Dr. Sharlet Salina, DO (referring physician).  The patient  indicated that the procedure had to be stopped because of severe pain.  The pain is described to be constant and occasionally he will be referred towards the back of the head, the upper back, and the shoulder areas.  He indicates having crepitus in the cervical region upon movement.   The patient's secondary area pain is that of the upper extremities (Bilateral) (L>R).  He refers that in the left upper extremity he has pain that goes all the way from the neck area down to his middle finger, ring finger, and pinky finger, most of the time.  He refers that occasionally he will have pain going into the thumb and index fingers.  This pain is described as an electrical-like sensation.  He also refers having pain in both of his wrists.  In the case of the right upper extremity the pain seems to be primarily in the wrist area.   The patient's third area of pain is that of the occipital region (Bilateral).  This pain is referred towards the top of the head and what appears to be the distribution of the greater occipital nerve, bilaterally.  He denies any surgeries, physical therapy, nerve blocks, or recent x-rays.   The patient's fourth area pain is that of the upper back, between the shoulder blades.  He refers that this pain feels like it comes from the neck area.   The patient's fifth area pain is that of the lower back (Bilateral) (L>R).  The patient denies any prior surgeries but does admit to having had fairly recent x-rays and MRIs as well as nerve blocks by Dr. Sharlet Salina.  He refers that these seem to be somewhat helpful.  The patient indicates having had physical therapy for his back approximately 15 years ago.   The patient's sixth area pain is that of the lower extremities (Bilateral) (L>R).  He refers that in the case of the  right lower extremity the pain is occasional and will go through the back of the leg but does not go all the way down into his foot.  In the case of the left lower extremity the pain seems to run down the back and lateral aspect of the leg all the way down into the lateral aspect of the foot and also the bottom of the foot.  He refers that it is a combination of pain, numbness, tingling, and weakness.  This seems to follow the distribution of the S1 dermatome."  In considering the treatment plan options, Mr. Standage was reminded that I no longer take patients for medication management only. I asked him to let me know if he had no intention of taking advantage of the interventional therapies, so that we could make arrangements to provide this space to someone interested. I also made it clear that undergoing interventional therapies for the purpose of getting pain medications is very inappropriate on the part of a patient, and it will not be tolerated in this practice. This type of behavior would suggest true addiction and therefore it requires referral to an addiction specialist.   Further details on both, my assessment(s), as well as the proposed treatment plan, please see below.  Controlled Substance Pharmacotherapy Assessment REMS (Risk Evaluation and Mitigation Strategy)  Opioid Analgesic:  None MME/day: 0 mg/day  Pill Count: None expected due to no prior prescriptions written by our practice. Dewayne Shorter, RN  01/19/2021  8:08 AM  Sign when Signing Visit Safety precautions to be maintained throughout the outpatient stay will  include: orient to surroundings, keep bed in low position, maintain call bell within reach at all times, provide assistance with transfer out of bed and ambulation.   Pharmacokinetics: Liberation and absorption (onset of action): WNL Distribution (time to peak effect): WNL Metabolism and excretion (duration of action): WNL         Pharmacodynamics: Desired  effects: Analgesia: Mr. Whitenack reports >50% benefit. Functional ability: Patient reports that medication allows him to accomplish basic ADLs Clinically meaningful improvement in function (CMIF): Sustained CMIF goals met Perceived effectiveness: Described as relatively effective, allowing for increase in activities of daily living (ADL) Undesirable effects: Side-effects or Adverse reactions: None reported Monitoring: [place] PMP: PDMP reviewed during this encounter. Online review of the past 25-monthperiod previously conducted. Not applicable at this point since we have not taken over the patient's medication management yet. List of other Serum/Urine Drug Screening Test(s):  No results found for: AMPHSCRSER, BARBSCRSER, BENZOSCRSER, COCAINSCRSER, COCAINSCRNUR, [place] [place] of all UDS test(s) done:  No results found for: TOXASSSELUR, SUMMARY Last UDS on record: No results found for: TOXASSSELUR, SUMMARY UDS interpretation: No unexpected findings.          Medication Assessment Form: Patient introduced to form today Treatment compliance: Treatment may start today if patient agrees with proposed plan. Evaluation of compliance is not applicable at this point Risk Assessment Profile: Aberrant behavior: See initial evaluations. None observed or detected today Comorbid factors increasing risk of overdose: See initial evaluation. No additional risks detected today Opioid risk tool (ORT):  Opioid Risk  01/04/2021  Alcohol 0  Illegal Drugs 0  Rx Drugs 0  Alcohol 0  Illegal Drugs 0  Rx Drugs 0  Psychological Disease 0  Depression 0  Opioid Risk Tool Scoring 0  Opioid Risk Interpretation Low Risk    ORT Scoring interpretation table:  Score <3 = Low Risk for SUD  Score between 4-7 = Moderate Risk for SUD  Score >8 = High Risk for Opioid Abuse   Risk of substance use disorder (SUD): Low  Risk Mitigation Strategies:   Patient opioid safety counseling: Completed today. Counseling provided to patient as per "Patient Counseling Document". Document signed by patient, attesting to counseling and understanding Patient-Prescriber Agreement (PPA): Obtained today.  Controlled substance notification to other providers: Written and sent today.  Pharmacologic Plan: Non-opioid analgesic therapy offered. Interventional alternatives discussed.             Laboratory Chemistry Profile   Renal Lab Results  Component Value Date   BUN 15 08/31/2020   CREATININE 0.87 08/31/2020   BCR 13 02/15/2016   GFR 91.30 08/31/2020   GFRAA >60 02/22/2017   GFRNONAA >60 08/20/2020   SPECGRAV 1.015 11/26/2019   PHUR 5.0 11/26/2019   PROTEINUR Negative 11/26/2019     Electrolytes Lab Results  Component Value Date   NA 140 08/31/2020   K 4.4 08/31/2020   CL 102 08/31/2020   CALCIUM 9.6 08/31/2020   MG 2.0 11/30/2015     Hepatic Lab Results  Component Value Date   AST 18 08/31/2020   ALT 21 08/31/2020   ALBUMIN 4.0 08/31/2020   ALKPHOS 66 08/31/2020     ID Lab Results  Component Value Date   HIV Non Reactive 08/20/2020   SARSCOV2NAA NEGATIVE 11/16/2020   HCVAB NEGATIVE 09/04/2015   RMSFIGG Negative 08/20/2020     Bone Lab Results  Component Value Date   TESTOSTERONE 485 11/22/2016     Endocrine  Lab Results  Component Value Date   GLUCOSE 86 08/31/2020   GLUCOSEU Negative 11/26/2019   HGBA1C 5.6 01/18/2019   TSH 0.585 11/29/2015   FREET4 1.12 03/18/2013   TESTOSTERONE 485 11/22/2016     Neuropathy Lab Results  Component Value Date   HGBA1C 5.6 01/18/2019   HIV Non Reactive 08/20/2020     CNS No results found for: COLORCSF, APPEARCSF, RBCCOUNTCSF, WBCCSF, POLYSCSF, LYMPHSCSF, EOSCSF, PROTEINCSF, GLUCCSF, JCVIRUS, CSFOLI, IGGCSF, LABACHR, ACETBL, LABACHR, ACETBL   Inflammation (CRP: Acute  ESR: Chronic) Lab Results  Component Value Date   CRP 1.2 (H) 01/04/2021   ESRSEDRATE 8 01/04/2021      Rheumatology No results found for: RF, ANA, LABURIC, URICUR, LYMEIGGIGMAB, LYMEABIGMQN, HLAB27   Coagulation Lab Results  Component Value Date   PLT 169 08/20/2020     Cardiovascular Lab Results  Component Value Date   TROPONINI <0.03 11/30/2015   HGB 13.6 08/20/2020   HCT 39.1 08/20/2020     Screening Lab Results  Component Value Date   SARSCOV2NAA NEGATIVE 11/16/2020   HCVAB NEGATIVE 09/04/2015   HIV Non Reactive 08/20/2020     Cancer No results found for: CEA, CA125, LABCA2   Allergens No results found for: ALMOND, APPLE, ASPARAGUS, AVOCADO, BANANA, BARLEY, BASIL, BAYLEAF, GREENBEAN, LIMABEAN, WHITEBEAN, BEEFIGE, REDBEET, BLUEBERRY, BROCCOLI, CABBAGE, MELON, CARROT, CASEIN, CASHEWNUT, CAULIFLOWER, CELERY     Note: Lab results reviewed.  Recent Diagnostic Imaging Review  Cervical Imaging: Cervical MR wo contrast: Results for orders placed during the hospital encounter of 11/18/17 MR Cervical Spine Wo Contrast  Narrative CLINICAL DATA:  Neck pain, LEFT arm pain.  EXAM: MRI CERVICAL SPINE WITHOUT CONTRAST  TECHNIQUE: Multiplanar, multisequence MR imaging of the cervical spine was performed. No intravenous contrast was administered.  COMPARISON:  Motion degraded MRI scan 09/23/2017. CT scan 02/22/2017.  FINDINGS: Today's study is also significantly motion degraded despite best efforts of the technologist. Examination is of marginal diagnostic utility.  Alignment: Anatomic  Vertebrae: No acute fracture, evidence of discitis, or bone lesion. Bold T1 compression fracture and spinous process avulsion.  Cord: No definite signal abnormality.  Slight flattening C4-C5.  Posterior Fossa, vertebral arteries, paraspinal tissues: Unremarkable.  Disc levels:  C2-3:  Normal disc space.  No impingement.  C3-4:  Disc desiccation.  No impingement.  C4-5: Disc space narrowing. Osseous spurring. Annular bulge. LEFT C5 foraminal narrowing.  C5-6: Slight disc  space narrowing. Annular bulge with osseous spurring. Mild asymmetric uncinate hypertrophy uncinate hypertrophy resulting in LEFT C6 foraminal narrowing.  C6-7:  Unremarkable.  C7-T1:  Unremarkable.  IMPRESSION: Motion degraded examination is of marginal diagnostic utility.  Ordinary spondylosis at C4-5 and C5-6 on the LEFT. Correlate clinically for symptomatic LEFT C5 and/or LEFT C6 radicular symptoms.  No significant malalignment or worrisome osseous lesion. Mild stenosis at C4-5 without definite abnormal cord signal.   Electronically Signed By: Staci Righter M.D. On: 11/18/2017 14:14  Cervical CT wo contrast: Results for orders placed during the hospital encounter of 02/22/17 CT Cervical Spine Wo Contrast  Narrative CLINICAL DATA:  Head trauma with subsequent neck pain.  EXAM: CT CERVICAL SPINE WITHOUT CONTRAST  TECHNIQUE: Multidetector CT imaging of the cervical spine was performed without intravenous contrast. Multiplanar CT image reconstructions were also generated.  COMPARISON:  08/19/2016  FINDINGS: Alignment: Normal  Skull base and vertebrae: Normal. Chronic corticated bone adjacent to the tip of the spinous process of T1, not significant.  Soft tissues and spinal canal: Normal except for small thyroid nodules.  Disc levels: Mild degenerative spondylosis at C4-5 and C5-6 with small osteophytes but no significant narrowing of the canal or foramina.  Upper chest: Mild pleural and parenchymal scarring.  Other: None  IMPRESSION: No significant cervical spine finding. Ordinary mild spondylosis at C4-5 and C5-6.   Electronically Signed By: Nelson Chimes M.D. On: 02/22/2017 16:36  Lumbosacral Imaging: Lumbar MR wo contrast: Results for orders placed during the hospital encounter of 11/18/17 MR Lumbar Spine Wo Contrast  Narrative CLINICAL DATA:  Low back pain with LEFT leg pain.  EXAM: MRI LUMBAR SPINE WITHOUT  CONTRAST  TECHNIQUE: Multiplanar, multisequence MR imaging of the lumbar spine was performed. No intravenous contrast was administered.  COMPARISON:  None.  FINDINGS: Segmentation:  Standard  Alignment: Slight degenerative scoliosis convex LEFT mid lumbar region. No subluxation.  Vertebrae:  No worrisome osseous lesion.  Conus medullaris and cauda equina: Conus extends to the L2 level. Conus and cauda equina appear normal.  Paraspinal and other soft tissues: Unremarkable. Renal cystic disease, incompletely evaluated.  Disc levels:  L1-L2:  Normal.  L2-L3:  Facet arthropathy.  No disc protrusion or impingement.  L3-L4: Disc desiccation. Annular bulge. Facet arthropathy. No disc protrusion or impingement.  L4-L5:  Facet arthropathy.  No disc protrusion or impingement.  L5-S1:  Facet arthropathy.  No disc protrusion or impingement.  IMPRESSION: Unremarkable lumbar spine MRI. No disc protrusion or neural impingement. No cause is seen for LEFT leg symptoms.   Electronically Signed By: Staci Righter M.D. On: 11/18/2017 14:21  Complexity Note: Imaging results reviewed. Results shared with Mr. Whichard, using Layman's terms.                        Meds   Current Outpatient Medications:    albuterol (VENTOLIN HFA) 108 (90 Base) MCG/ACT inhaler, TAKE 2 PUFFS BY MOUTH EVERY 6 HOURS AS NEEDED FOR WHEEZE OR SHORTNESS OF BREATH, Disp: 8.5 each, Rfl: 1   ALPRAZolam (XANAX) 0.5 MG tablet, TAKE 1 TABLET BY MOUTH EVERY NIGHT AT BEDTIME AS NEEDED FOR ANXIETY, Disp: 20 tablet, Rfl: 0   atorvastatin (LIPITOR) 20 MG tablet, TAKE 1 TABLET BY MOUTH IN  THE MORNING, Disp: 90 tablet, Rfl: 3   cyclobenzaprine (FLEXERIL) 10 MG tablet, TAKE 1 TABLET BY MOUTH 3  TIMES DAILY AS NEEDED FOR  MUSCLE SPASM(S), Disp: 90 tablet, Rfl: 0   escitalopram (LEXAPRO) 10 MG tablet, TAKE 1 TABLET BY MOUTH  DAILY, Disp: 90 tablet, Rfl: 3   finasteride (PROSCAR) 5 MG tablet, TAKE 1 TABLET BY MOUTH  DAILY, Disp:  90 tablet, Rfl: 3   fluticasone-salmeterol (ADVAIR HFA) 115-21 MCG/ACT inhaler, Inhale 2 puffs into the lungs 2 (two) times daily., Disp: 1 each, Rfl: 12   loratadine (CLARITIN) 10 MG tablet, Take 10 mg by mouth daily as needed for allergies., Disp: , Rfl:    losartan (COZAAR) 50 MG tablet, TAKE 1 TABLET BY MOUTH  DAILY, Disp: 90 tablet, Rfl: 3   meloxicam (MOBIC) 15 MG tablet, TAKE 1 TABLET BY MOUTH  DAILY AS NEEDED FOR PAIN, Disp: 90 tablet, Rfl: 3   pantoprazole (PROTONIX) 40 MG tablet, TAKE 1 TABLET BY MOUTH  DAILY, Disp: 90 tablet, Rfl: 3   tadalafil (CIALIS) 5 MG tablet, TAKE 1 TABLET BY MOUTH  DAILY, Disp: 90 tablet, Rfl: 2   TRULANCE 3 MG TABS, TAKE 1 TABLET BY MOUTH  DAILY, Disp: 90 tablet, Rfl: 3  Current Facility-Administered Medications:    fentaNYL (SUBLIMAZE) injection 25-50  mcg, 25-50 mcg, Intravenous, Q5 min PRN, Milinda Pointer, MD, 50 mcg at 01/19/21 3557   lactated ringers infusion 1,000 mL, 1,000 mL, Intravenous, Once, Milinda Pointer, MD  ROS  Constitutional: Denies any fever or chills Gastrointestinal: No reported hemesis, hematochezia, vomiting, or acute GI distress Musculoskeletal: Denies any acute onset joint swelling, redness, loss of ROM, or weakness Neurological: No reported episodes of acute onset apraxia, aphasia, dysarthria, agnosia, amnesia, paralysis, loss of coordination, or loss of consciousness  Allergies  Mr. Jun is allergic to other.  PFSH  Drug: Mr. Fudala  reports no history of drug use. Alcohol:  reports no history of alcohol use. Tobacco:  reports that he quit smoking about 29 years ago. His smoking use included cigarettes. He has a 20.00 pack-year smoking history. He has never used smokeless tobacco. Medical:  has a past medical history of Anxiety, Arthritis, BPH (benign prostatic hyperplasia) (11/19/2013), Bulge of lumbar disc without myelopathy, Bulging disc, Cervical disc herniation, Colon polyp (04/18/2011), Constipation (08/31/2012),  Depression, Diverticulosis, ED (erectile dysfunction) of organic origin (01/04/2012), GERD (gastroesophageal reflux disease), H/O diverticulitis of colon, Hemorrhoid, Hiatal hernia, Hyperlipidemia, Levoscoliosis, Sleep difficulties, Spinal stenosis in cervical region, Testicular pain, left, and Typical atrial flutter ([place]) (11/2015). Surgical: Mr. Harr  has a past surgical history that includes Ganglion cyst excision (Right, 1994); Nasal septum surgery (2011); Cardiac catheterization (N/A, 01/04/2016); Cardiac catheterization (N/A, 02/17/2016); Atrial flutter ablation (02/17/2016); Colonoscopy (2014); Hernia repair (Right, 1994); and Colonoscopy with propofol (N/A, 06/30/2016). Family: family history includes Cancer in his mother; Heart disease in his father; Heart disease (age of onset: 20) in his mother; Hyperlipidemia in his father and mother.  Constitutional Exam  General appearance: Well nourished, well developed, and well hydrated. In no apparent acute distress Vitals:   01/19/21 0849 01/19/21 0900 01/19/21 0910 01/19/21 0920  BP: 128/86 124/81 125/85 123/86  Pulse:      Resp: 18 18 17 18   Temp:  (!) 97.1 F (36.2 C)  (!) 97.3 F (36.3 C)  SpO2: 97% 99% 99% 99%  Weight:      Height:       BMI Assessment: Estimated body mass index is 27.55 kg/m as calculated from the following:   Height as of this encounter: 5' 10"  (1.778 m).   Weight as of this encounter: 192 lb (87.1 kg).  BMI interpretation table: BMI level Category Range association with higher incidence of chronic pain  <18 kg/m2 Underweight   18.5-24.9 kg/m2 Ideal body weight   25-29.9 kg/m2 Overweight Increased incidence by 20%  30-34.9 kg/m2 Obese (Class I) Increased incidence by 68%  35-39.9 kg/m2 Severe obesity (Class II) Increased incidence by 136%  >40 kg/m2 Extreme obesity (Class III) Increased incidence by 254%   Patient's current BMI Ideal Body weight  Body mass index is 27.55 kg/m. Ideal body weight: 73 kg (160  lb 15 oz) Adjusted ideal body weight: 78.6 kg (173 lb 5.8 oz)   BMI Readings from Last 4 Encounters:  01/19/21 27.55 kg/m  01/04/21 27.98 kg/m  11/30/20 28.04 kg/m  08/26/20 27.41 kg/m   Wt Readings from Last 4 Encounters:  01/19/21 192 lb (87.1 kg)  01/04/21 195 lb (88.5 kg)  11/30/20 195 lb 6.4 oz (88.6 kg)  08/26/20 191 lb (86.6 kg)    Psych/Mental status: Alert, oriented x 3 (person, place, & time)       Eyes: PERLA Respiratory: No evidence of acute respiratory distress  Assessment & Plan  Primary Diagnosis & Pertinent Problem List: The primary  encounter diagnosis was Cervicalgia. Diagnoses of Cervical facet syndrome (Bilateral), Spondylosis without myelopathy or radiculopathy, cervical region, Cervical retrolisthesis of C4 over C5, Cervical facet hypertrophy, Cervicogenic headache (3ry area of Pain) (Bilateral), DDD (degenerative disc disease), cervical, Cervical facet syndrome, Retrolisthesis, Facet hypertrophy of cervical region, and Cervicogenic headache were also pertinent to this visit.  Visit Diagnosis: 1. Cervicalgia   2. Cervical facet syndrome (Bilateral)   3. Spondylosis without myelopathy or radiculopathy, cervical region   4. Cervical retrolisthesis of C4 over C5   5. Cervical facet hypertrophy   6. Cervicogenic headache (3ry area of Pain) (Bilateral)   7. DDD (degenerative disc disease), cervical   8. Cervical facet syndrome   9. Retrolisthesis   10. Facet hypertrophy of cervical region   11. Cervicogenic headache    Problems updated and reviewed during this visit: Problem  Cervicalgia  Spondylosis Without Myelopathy Or Radiculopathy, Cervical Region    Plan of Care  Pharmacotherapy (Medications Ordered): Meds ordered this encounter  Medications   lidocaine (XYLOCAINE) 2 % (with pres) injection 400 mg   lactated ringers infusion 1,000 mL   midazolam (VERSED) 5 MG/5ML injection 0.5-2 mg    Make sure Flumazenil is available in the pyxis when using  this medication. If oversedation occurs, administer 0.2 mg IV over 15 sec. If after 45 sec no response, administer 0.2 mg again over 1 min; may repeat at 1 min intervals; not to exceed 4 doses (1 mg)   ropivacaine (PF) 2 mg/mL (0.2%) (NAROPIN) injection 18 mL   dexamethasone (DECADRON) injection 20 mg   fentaNYL (SUBLIMAZE) injection 25-50 mcg    Make sure Narcan is available in the pyxis when using this medication. In the event of respiratory depression (RR< 8/min): Titrate NARCAN (naloxone) in increments of 0.1 to 0.2 mg IV at 2-3 minute intervals, until desired degree of reversal.    Procedure Orders         CERVICAL FACET (MEDIAL BRANCH NERVE BLOCK)      Lab Orders  No laboratory test(s) ordered today   Imaging Orders         DG PAIN CLINIC C-ARM 1-60 MIN NO REPORT     Referral Orders  No referral(s) requested today    Pharmacological management options:  Opioid Analgesics: I will not be prescribing any opioids at this time Membrane stabilizer: I will not be prescribing any at this time Muscle relaxant: I will not be prescribing any at this time NSAID: I will not be prescribing any at this time Other analgesic(s): I will not be prescribing any at this time     Interventional Therapies  Risk  Complexity Considerations:   Estimated body mass index is 27.98 kg/m as calculated from the following:   Height as of this encounter: 5' 10"  (1.778 m).   Weight as of this encounter: 195 lb (88.5 kg). Anxiety   Planned  Pending:   Diagnostic bilateral cervical facet MBB #1    Under consideration:   Diagnostic bilateral cervical facet MBB #1  Diagnostic left cervical ESI #1    Completed:   None at this time   Therapeutic  Palliative (PRN) options:   None established    Provider-requested follow-up: Return in about 2 weeks (around 02/02/2021) for Proc-day (T,Th), (F2F), (PPE). Recent Visits Date Type Provider Dept  01/04/21 Office Visit Milinda Pointer, MD [facility]  Showing recent visits within past 90 days and meeting all other requirements Today's Visits Date Type Provider Dept  01/19/21 Procedure  visit Milinda Pointer, MD [facility]  Showing today's visits and meeting all other requirements Future Appointments Date Type Provider Dept  02/04/21 Appointment Milinda Pointer, MD [facility]  Showing future appointments within next 90 days and meeting all other requirements Primary Care Physician: [name], MD Note by: Gaspar Cola, MD Date: 01/19/2021; Time: 10:03 AM

## 2021-01-19 NOTE — Progress Notes (Signed)
PROVIDER NOTE: Information contained herein reflects review and annotations entered in association with encounter. Interpretation of such information and data should be left to medically-trained personnel. Information provided to patient can be located elsewhere in the medical record under "Patient Instructions". Document created using STT-dictation technology, any transcriptional errors that may result from process are unintentional.    Patient: [name].  Service Category: Procedure Provider: Gaspar Cola, MD DOB: 1955-11-10 DOS: 01/19/2021 Location: [facility] MRN: [id number] Setting: Ambulatory - outpatient Referring Provider: [name], MD Type: Established Patient Specialty: Interventional Pain Management PCP: [name], MD  Primary Reason for Visit: Interventional Pain Management Treatment. CC: Neck Pain   Procedure:          Anesthesia, Analgesia, Anxiolysis:  Type: Cervical Facet Medial Branch Block(s)           Primary Purpose: Diagnostic Region: Posterolateral cervical spine Level: C3, C4, C5, C6, & C7 Medial Branch Level(s). Injecting these levels blocks the C3-4, C4-5, C5-6, and C6-7 cervical facet joints. Laterality: Bilateral  Anesthesia: Local (1-2% Lidocaine)  Anxiolysis: IV  Sedation: Minimal  Guidance: Fluoroscopy           Position: Prone with head of the table raised to facilitate breathing.   Indications: 1. Cervical facet syndrome (Bilateral)   2. Cervicalgia   3. Spondylosis without myelopathy or radiculopathy, cervical region   4. Cervical retrolisthesis of C4 over C5   5. Cervical facet hypertrophy   6. Cervicogenic headache (3ry area of Pain) (Bilateral)   7. DDD (degenerative disc disease), cervical    Pain Score: Pre-procedure: 6 /10 Post-procedure: 0-No pain/10     Pre-op H&P Assessment:  [name] is a 65 y.o. (year old), male patient, seen today for interventional treatment. He  has a past surgical  history that includes Ganglion cyst excision (Right, 1994); Nasal septum surgery (2011); Cardiac catheterization (N/A, 01/04/2016); Cardiac catheterization (N/A, 02/17/2016); Atrial flutter ablation (02/17/2016); Colonoscopy (2014); Hernia repair (Right, 1994); and Colonoscopy with propofol (N/A, 06/30/2016). [name] has a current medication list which includes the following prescription(s): albuterol, alprazolam, atorvastatin, cyclobenzaprine, escitalopram, finasteride, advair hfa, loratadine, losartan, meloxicam, pantoprazole, tadalafil, and trulance, and the following Facility-Administered Medications: fentanyl and lactated ringers. His primarily concern today is the Neck Pain  Initial Vital Signs:  Pulse/HCG Rate: 70ECG Heart Rate: 69 Temp: (!) 97.1 F (36.2 C) Resp: 18 BP: (!) 144/88 SpO2: 100 %  BMI: Estimated body mass index is 27.55 kg/m as calculated from the following:   Height as of this encounter: 5\' 10"  (1.778 m).   Weight as of this encounter: 192 lb (87.1 kg).  Risk Assessment: Allergies: Reviewed. He is allergic to other.  Allergy Precautions: None required Coagulopathies: Reviewed. None identified.  Blood-thinner therapy: None at this time Active Infection(s): Reviewed. None identified. [name] is afebrile  Site Confirmation: [name] was asked to confirm the procedure and laterality before marking the site Procedure checklist: Completed Consent: Before the procedure and under the influence of no sedative(s), amnesic(s), or anxiolytics, the patient was informed of the treatment options, risks and possible complications. To fulfill our ethical and legal obligations, as recommended by the American Medical Association's Code of Ethics, I have informed the patient of my clinical impression; the nature and purpose of the treatment or procedure; the risks, benefits, and possible complications of the intervention; the alternatives, including doing nothing; the risk(s) and  benefit(s) of the alternative treatment(s) or procedure(s); and the risk(s) and benefit(s)  of doing nothing. The patient was provided information about the general risks and possible complications associated with the procedure. These may include, but are not limited to: failure to achieve desired goals, infection, bleeding, organ or nerve damage, allergic reactions, paralysis, and death. In addition, the patient was informed of those risks and complications associated to Spine-related procedures, such as failure to decrease pain; infection (i.e.: Meningitis, epidural or intraspinal abscess); bleeding (i.e.: epidural hematoma, subarachnoid hemorrhage, or any other type of intraspinal or peri-dural bleeding); organ or nerve damage (i.e.: Any type of peripheral nerve, nerve root, or spinal cord injury) with subsequent damage to sensory, motor, and/or autonomic systems, resulting in permanent pain, numbness, and/or weakness of one or several areas of the body; allergic reactions; (i.e.: anaphylactic reaction); and/or death. Furthermore, the patient was informed of those risks and complications associated with the medications. These include, but are not limited to: allergic reactions (i.e.: anaphylactic or anaphylactoid reaction(s)); adrenal axis suppression; blood sugar elevation that in diabetics may result in ketoacidosis or comma; water retention that in patients with history of congestive heart failure may result in shortness of breath, pulmonary edema, and decompensation with resultant heart failure; weight gain; swelling or edema; medication-induced neural toxicity; particulate matter embolism and blood vessel occlusion with resultant organ, and/or nervous system infarction; and/or aseptic necrosis of one or more joints. Finally, the patient was informed that Medicine is not an exact science; therefore, there is also the possibility of unforeseen or unpredictable risks and/or possible complications that may  result in a catastrophic outcome. The patient indicated having understood very clearly. We have given the patient no guarantees and we have made no promises. Enough time was given to the patient to ask questions, all of which were answered to the patient's satisfaction. [name] has indicated that he wanted to continue with the procedure. Attestation: I, the ordering provider, attest that I have discussed with the patient the benefits, risks, side-effects, alternatives, likelihood of achieving goals, and potential problems during recovery for the procedure that I have provided informed consent. Date  Time: 01/19/2021  7:58 AM  Pre-Procedure Preparation:  Monitoring: As per clinic protocol. Respiration, ETCO2, SpO2, BP, heart rate and rhythm monitor placed and checked for adequate function Safety Precautions: Patient was assessed for positional comfort and pressure points before starting the procedure. Time-out: I initiated and conducted the "Time-out" before starting the procedure, as per protocol. The patient was asked to participate by confirming the accuracy of the "Time Out" information. Verification of the correct person, site, and procedure were performed and confirmed by me, the nursing staff, and the patient. "Time-out" conducted as per Joint Commission's Universal Protocol (UP.01.01.01). Time: 0831  Description of Procedure:          Laterality: Bilateral. The procedure was performed in identical fashion on both sides. Level: C3, C4, C5, C6, & C7 Medial Branch Level(s). Area Prepped: Posterior Cervico-thoracic Region DuraPrep (Iodine Povacrylex [0.7% available iodine] and Isopropyl Alcohol, 74% w/w) Safety Precautions: Aspiration looking for blood return was conducted prior to all injections. At no point did we inject any substances, as a needle was being advanced. Before injecting, the patient was told to immediately notify me if he was experiencing any new onset of "ringing in the ears,  or metallic taste in the mouth". No attempts were made at seeking any paresthesias. Safe injection practices and needle disposal techniques used. Medications properly checked for expiration dates. SDV (single dose vial) medications used. After the completion of the procedure, all  disposable equipment used was discarded in the proper designated medical waste containers. Local Anesthesia: Protocol guidelines were followed. The patient was positioned over the fluoroscopy table. The area was prepped in the usual manner. The time-out was completed. The target area was identified using fluoroscopy. A 12-in long, straight, sterile hemostat was used with fluoroscopic guidance to locate the targets for each level blocked. Once located, the skin was marked with an approved surgical skin marker. Once all sites were marked, the skin (epidermis, dermis, and hypodermis), as well as deeper tissues (fat, connective tissue and muscle) were infiltrated with a small amount of a short-acting local anesthetic, loaded on a 10cc syringe with a 25G, 1.5-in  Needle. An appropriate amount of time was allowed for local anesthetics to take effect before proceeding to the next step. Local Anesthetic: Lidocaine 2.0% The unused portion of the local anesthetic was discarded in the proper designated containers. Technical explanation of process:  C3 Medial Branch Nerve Block (MBB): The target area for the C3 dorsal medial articular branch is the lateral concave waist of the articular pillar of C3. Under fluoroscopic guidance, a Quincke needle was inserted until contact was made with os over the postero-lateral aspect of the articular pillar of C3 (target area). After negative aspiration for blood, 0.5 mL of the nerve block solution was injected without difficulty or complication. The needle was removed intact. C4 Medial Branch Nerve Block (MBB): The target area for the C4 dorsal medial articular branch is the lateral concave waist of the  articular pillar of C4. Under fluoroscopic guidance, a Quincke needle was inserted until contact was made with os over the postero-lateral aspect of the articular pillar of C4 (target area). After negative aspiration for blood, 0.5 mL of the nerve block solution was injected without difficulty or complication. The needle was removed intact. C5 Medial Branch Nerve Block (MBB): The target area for the C5 dorsal medial articular branch is the lateral concave waist of the articular pillar of C5. Under fluoroscopic guidance, a Quincke needle was inserted until contact was made with os over the postero-lateral aspect of the articular pillar of C5 (target area). After negative aspiration for blood, 0.5 mL of the nerve block solution was injected without difficulty or complication. The needle was removed intact. C6 Medial Branch Nerve Block (MBB): The target area for the C6 dorsal medial articular branch is the lateral concave waist of the articular pillar of C6. Under fluoroscopic guidance, a Quincke needle was inserted until contact was made with os over the postero-lateral aspect of the articular pillar of C6 (target area). After negative aspiration for blood, 0.5 mL of the nerve block solution was injected without difficulty or complication. The needle was removed intact. C7 Medial Branch Nerve Block (MBB): The target for the C7 dorsal medial articular branch lies on the superior-medial tip of the C7 transverse process. Under fluoroscopic guidance, a Quincke needle was inserted until contact was made with os over the postero-lateral aspect of the articular pillar of C7 (target area). After negative aspiration for blood, 0.5 mL of the nerve block solution was injected without difficulty or complication. The needle was removed intact. Procedural Needles: 22-gauge, 3.5-inch, Quincke needles used for all levels. Nerve block solution: 0.2% PF-Ropivacaine + Triamcinolone (40 mg/mL) diluted to a final concentration of 4  mg of Triamcinolone/mL of Ropivacaine The unused portion of the solution was discarded in the proper designated containers.  Once the entire procedure was completed, the treated area was cleaned, making sure to  leave some of the prepping solution back to take advantage of its long term bactericidal properties.  Anatomy Reference Guide:       Vitals:   01/19/21 0849 01/19/21 0900 01/19/21 0910 01/19/21 0920  BP: 128/86 124/81 125/85 123/86  Pulse:      Resp: 18 18 17 18   Temp:  (!) 97.1 F (36.2 C)  (!) 97.3 F (36.3 C)  SpO2: 97% 99% 99% 99%  Weight:      Height:        Start Time: 0831 hrs. End Time: 0849 hrs.  Imaging Guidance (Spinal):          Type of Imaging Technique: Fluoroscopy Guidance (Spinal) Indication(s): Assistance in needle guidance and placement for procedures requiring needle placement in or near specific anatomical locations not easily accessible without such assistance. Exposure Time: Please see nurses notes. Contrast: None used. Fluoroscopic Guidance: I was personally present during the use of fluoroscopy. "Tunnel Vision Technique" used to obtain the best possible view of the target area. Parallax error corrected before commencing the procedure. "Direction-depth-direction" technique used to introduce the needle under continuous pulsed fluoroscopy. Once target was reached, antero-posterior, oblique, and lateral fluoroscopic projection used confirm needle placement in all planes. Images permanently stored in EMR. Interpretation: No contrast injected. I personally interpreted the imaging intraoperatively. Adequate needle placement confirmed in multiple planes. Permanent images saved into the patient's record.  Antibiotic Prophylaxis:   Anti-infectives (From admission, onward)    None      Indication(s): None identified  Post-operative Assessment:  Post-procedure Vital Signs:  Pulse/HCG Rate: 7068 Temp: (!) 97.3 F (36.3 C) Resp: 18 BP: 123/86 SpO2: 99  %  EBL: None  Complications: No immediate post-treatment complications observed by team, or reported by patient.  Note: The patient tolerated the entire procedure well. A repeat set of vitals were taken after the procedure and the patient was kept under observation following institutional policy, for this type of procedure. Post-procedural neurological assessment was performed, showing return to baseline, prior to discharge. The patient was provided with post-procedure discharge instructions, including a section on how to identify potential problems. Should any problems arise concerning this procedure, the patient was given instructions to immediately contact us, at any time, without hesitation. In any case, we plan to contact the patient by telephone for a follow-up status report regarding this interventional procedure.  Comments:  No additional relevant information.  Plan of Care  Orders:  Orders Placed This Encounter  Procedures   CERVICAL FACET (MEDIAL BRANCH NERVE BLOCK)     Scheduling Instructions:     Side: Bilateral     Level: C3-4, C4-5, C5-6 Facet joints (C3, C4, C5, C6, & C7 Medial Branch Nerves)     Sedation: Patient's choice.     Timeframe: Today    Order Specific Question:   Where will this procedure be performed?    Answer:   [facility]   DG [facility] C-ARM 1-60 MIN NO REPORT    Intraoperative interpretation by procedural physician at [place].    Standing Status:   Standing    Number of Occurrences:   1    Order Specific Question:   Reason for exam:    Answer:   Assistance in needle guidance and placement for procedures requiring needle placement in or near specific anatomical locations not easily accessible without such assistance.   Informed Consent Details: Physician/Practitioner Attestation; Transcribe to consent form and obtain patient signature    Note: Always  confirm laterality of pain with [name], before procedure. Transcribe to  consent form and obtain patient signature.    Order Specific Question:   Physician/Practitioner attestation of informed consent for procedure/surgical case    Answer:   I, the physician/practitioner, attest that I have discussed with the patient the benefits, risks, side effects, alternatives, likelihood of achieving goals and potential problems during recovery for the procedure that I have provided informed consent.    Order Specific Question:   Procedure    Answer:   Bilateral Cervical facet block under fluoroscopic guidance.    Order Specific Question:   Physician/Practitioner performing the procedure    Answer:   Dimitris Shanahan A. 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   none MME/day: 0 mg/day   Medications ordered for procedure: Meds ordered this encounter  Medications   lidocaine (XYLOCAINE) 2 % (with pres) injection 400 mg   lactated ringers infusion 1,000 mL   midazolam (VERSED) 5 MG/5ML injection 0.5-2 mg    Make sure Flumazenil is available in the pyxis when using this medication. If oversedation occurs, administer 0.2 mg IV over 15 sec. If after 45 sec no response, administer 0.2 mg again over 1 min; may repeat at 1 min intervals; not to exceed 4 doses (1 mg)   ropivacaine (PF) 2 mg/mL (0.2%) (NAROPIN) injection 18 mL   dexamethasone (DECADRON) injection 20 mg   fentaNYL (SUBLIMAZE) injection 25-50 mcg    Make sure Narcan is available in the pyxis when using this medication. In the event of respiratory depression (RR< 8/min): Titrate NARCAN (naloxone) in increments of 0.1 to  0.2 mg IV at 2-3 minute intervals, until desired degree of reversal.    Medications administered: We administered lidocaine, midazolam, ropivacaine (PF) 2 mg/mL (0.2%), dexamethasone, and fentaNYL.  See the medical record for exact dosing, route, and time of administration.  Follow-up plan:   Return in about 2 weeks (around 02/02/2021) for Proc-day (T,Th), (F2F), (PPE).      Interventional Therapies  Risk  Complexity Considerations:   Estimated body mass index is 27.98 kg/m as calculated from the following:   Height as of this encounter: 5\' 10"  (1.778 m).   Weight as of this encounter: 195 lb (88.5 kg). Anxiety   Planned  Pending:   Diagnostic bilateral cervical facet MBB #1    Under consideration:   Diagnostic bilateral cervical facet MBB #1  Diagnostic left cervical ESI #1    Completed:   None at this time   Therapeutic  Palliative (PRN) options:   None established     Recent Visits Date Type Provider Dept  01/04/21 Office Visit Milinda Pointer, MD [facility]  Showing recent visits within past 90 days and meeting all other requirements Today's Visits Date Type Provider Dept  01/19/21 Procedure visit Milinda Pointer, MD [facility]  Showing today's visits and meeting all other requirements Future Appointments Date Type Provider Dept  02/04/21 Appointment Milinda Pointer, MD [facility]  Showing  future appointments within next 90 days and meeting all other requirements Disposition: Discharge home  Discharge (Date  Time): 01/19/2021; 0922 hrs.   Primary Care Physician: [name], MD Location: [facility] Note by: Gaspar Cola, MD Date: 01/19/2021; Time: 10:03 AM  Disclaimer:  Medicine is not an Chief Strategy Officer. The only guarantee in medicine is that nothing is guaranteed. It is important to note that the decision to proceed with this intervention was based on the information  collected from the patient. The Data and conclusions were drawn from the patient's questionnaire, the interview, and the physical examination. Because the information was provided in large part by the patient, it cannot be guaranteed that it has not been purposely or unconsciously manipulated. Every effort has been made to obtain as much relevant data as possible for this evaluation. It is important to note that the conclusions that lead to this procedure are derived in large part from the available data. Always take into account that the treatment will also be dependent on availability of resources and existing treatment guidelines, considered by other Pain Management Practitioners as being common knowledge and practice, at the time of the intervention. For Medico-Legal purposes, it is also important to point out that variation in procedural techniques and pharmacological choices are the acceptable norm. The indications, contraindications, technique, and results of the above procedure should only be interpreted and judged by a Board-Certified Interventional Pain Specialist with extensive familiarity and expertise in the same exact procedure and technique.

## 2021-01-19 NOTE — Progress Notes (Signed)
Safety precautions to be maintained throughout the outpatient stay will include: orient to surroundings, keep bed in low position, maintain call bell within reach at all times, provide assistance with transfer out of bed and ambulation.  

## 2021-01-19 NOTE — Addendum Note (Signed)
Addended by: Milinda Pointer A on: 01/19/2021 10:04 AM   Modules accepted: Level of Service

## 2021-01-20 ENCOUNTER — Telehealth: Payer: Self-pay | Admitting: *Deleted

## 2021-01-20 NOTE — Telephone Encounter (Signed)
Denies any post procedure issues. 

## 2021-02-03 ENCOUNTER — Ambulatory Visit: Payer: Medicare Other | Admitting: Pain Medicine

## 2021-02-03 NOTE — Progress Notes (Signed)
PROVIDER NOTE: Information contained herein reflects review and annotations entered in association with encounter. Interpretation of such information and data should be left to medically-trained personnel. Information provided to patient can be located elsewhere in the medical record under "Patient Instructions". Document created using STT-dictation technology, any transcriptional errors that may result from process are unintentional.    Patient: [name].  Service Category: E/M  Provider: Gaspar Cola, MD  DOB: Jun 10, 1955  DOS: 02/04/2021  Specialty: Interventional Pain Management  MRN: [id number]  Setting: Ambulatory outpatient  PCP: [name], MD  Type: Established Patient    Referring Provider: [name], MD  Location: Office  Delivery: Face-to-face     HPI  Mr. [name]., a 65 y.o. year old male, is here today because of his Cervicalgia [M54.2]. Mr. Denning primary complain today is Neck Pain Last encounter: My last encounter with him was on 01/19/2021. Pertinent problems: Mr. 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); Lipoma of back; Chronic low back pain (4th area of Pain) (Bilateral) (L>R) w/o sciatica; Right knee pain; Neck swelling; Chronic cervical radiculopathy (C7, C8) (Left); Bilateral leg numbness; Chronic upper extremity pain (2ry area of Pain) (Bilateral) (L>R); Chronic lower extremity pain (5th area of Pain) (Bilateral) (L>R); Chronic pain syndrome; Chronic neck pain (1ry area of Pain) (Bilateral) (L>R); Cervicogenic headache (3ry area of Pain) (Bilateral); Lumbosacral radiculopathy at S1 (Left); Chronic upper back pain (6th area of Pain); Cervical facet syndrome (Bilateral); Abnormal MRI, lumbar spine (11/18/2017); DDD (degenerative disc disease), lumbar; Lumbar facet arthropathy (Multilevel) (Bilateral); Lumbar facet syndrome; Abnormal MRI, cervical spine (11/18/2017); DDD (degenerative disc disease), cervical; Cervical facet  hypertrophy; Cervical retrolisthesis of C4 over C5; Cervicalgia; Spondylosis without myelopathy or radiculopathy, cervical region; and Thoracic compression fracture, sequela (T1) on their pertinent problem list. Pain Assessment: Severity of Chronic pain is reported as a 4 /10. Location: Neck Right, Left/radiates down arms to wrist, left is worse. Onset: More than a month ago. Quality: Constant, Numbness, Discomfort, Tingling. Timing: Constant. Modifying factor(s): rest. Vitals:  height is _0  (1.778 m) and weight is 194 lb (88 kg). His temperature is 97.2 F (36.2 C) (abnormal). His blood pressure is 128/71 and his pulse is 83. His respiration is 18 and oxygen saturation is 100%.   Reason for encounter: post-procedure evaluation and assessment.  Today I took the time to explain to the patient in great detail the evaluation process for any type of diagnostic procedures.  He was informed about the role that the local anesthetic and steroid explained the diagnosis of conditions and after this I proceeded to then ask him about the results of the recent diagnostic bilateral cervical facet block.  He indicated having attained 50% relief of the pain for the duration of the local anesthetic which then wore off once the numbing medicine effect went away.  He refers not having had any long-lasting benefit from the injection.  Unfortunately, the patient could not remember if he had any relief of his cervicogenic headaches, shoulder pain, but he refers that the upper back pain and upper extremity pain did not go away.  This would suggest that he has significant part of his etiology being intraspinal as opposed to perispinal or paraspinal.  Because this was his first procedure and he was not really familiar with what we were looking for and therefore, the information that we have obtained from him was limited.  Today I have informed him that the plan is to  order an MRI of the cervical spine, and we will schedule him for  a left-sided cervical epidural steroid injection with anxiolysis.  In addition to that, today he has confirmed that he continues to have pain down the left upper extremity down to his middle, ring, and pinky fingers and what appears to be a C7/C8 dermatomal distribution.  He refers that in the right upper extremity he has that the pain is not as constant, but when it does happen, he will go all the way down to the ring and pinky fingers and what appears to be a C8 dermatomal distribution.  For this reason we will be ordering a nerve conduction test of the upper extremities.  Post-procedure evaluation     Procedure:          Anesthesia, Analgesia, Anxiolysis:  Type: Cervical Facet Medial Branch Block(s)           Primary Purpose: Diagnostic Region: Posterolateral cervical spine Level: C3, C4, C5, C6, & C7 Medial Branch Level(s). Injecting these levels blocks the C3-4, C4-5, C5-6, and C6-7 cervical facet joints. Laterality: Bilateral  Anesthesia: Local (1-2% Lidocaine)  Anxiolysis: IV  Sedation: Minimal  Guidance: Fluoroscopy           Position: Prone with head of the table raised to facilitate breathing.   Indications: 1. Cervical facet syndrome (Bilateral)   2. Cervicalgia   3. Spondylosis without myelopathy or radiculopathy, cervical region   4. Cervical retrolisthesis of C4 over C5   5. Cervical facet hypertrophy   6. Cervicogenic headache (3ry area of Pain) (Bilateral)   7. DDD (degenerative disc disease), cervical    Pain Score: Pre-procedure: 6 /10 Post-procedure: 0-No pain/10     Effectiveness:  Initial hour after procedure: 50 %. Subsequent 4-6 hours post-procedure: 50 %. Analgesia past initial 6 hours: 0 %. Ongoing improvement:  Analgesic: No long-term benefit from the procedure Function: Back to baseline ROM: Back to baseline  Pharmacotherapy Assessment  Analgesic: .   none MME/day: 0 mg/day   Monitoring: [place] PMP: PDMP reviewed during this encounter.        Pharmacotherapy: No side-effects or adverse reactions reported. Compliance: No problems identified. Effectiveness: Clinically acceptable.  Dewayne Shorter, RN  02/04/2021  9:25 AM  Sign when Signing Visit Safety precautions to be maintained throughout the outpatient stay will include: orient to surroundings, keep bed in low position, maintain call bell within reach at all times, provide assistance with transfer out of bed and ambulation.    UDS:  No results found for: SUMMARY   ROS  Constitutional: Denies any fever or chills Gastrointestinal: No reported hemesis, hematochezia, vomiting, or acute GI distress Musculoskeletal: Denies any acute onset joint swelling, redness, loss of ROM, or weakness Neurological: No reported episodes of acute onset apraxia, aphasia, dysarthria, agnosia, amnesia, paralysis, loss of coordination, or loss of consciousness  Medication Review  ALPRAZolam, Plecanatide, albuterol, atorvastatin, cyclobenzaprine, diazepam, escitalopram, finasteride, fluticasone-salmeterol, loratadine, losartan, meloxicam, pantoprazole, and tadalafil  History Review  Allergy: Mr. Kluever is allergic to other. Drug: Mr. Hippe  reports no history of drug use. Alcohol:  reports no history of alcohol use. Tobacco:  reports that he quit smoking about 30 years ago. His smoking use included cigarettes. He has a 20.00 pack-year smoking history. He has never used smokeless tobacco. Social: Mr. Carrizales  reports that he quit smoking about 30 years ago. His smoking use included cigarettes. He has a 20.00 pack-year smoking history. He has never used smokeless tobacco. He reports  that he does not drink alcohol and does not use drugs. 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 Surgical: Mr. Bergum  has a past surgical history that includes Ganglion cyst excision (Right, 1994); Nasal septum surgery (2011); Cardiac catheterization (N/A, 01/04/2016); Cardiac catheterization (N/A, 02/17/2016); Atrial flutter ablation (02/17/2016); Colonoscopy (2014); Hernia repair (Right, 1994); and Colonoscopy with propofol (N/A, 06/30/2016). Family: family history includes Cancer in his mother; Heart disease in his father; Heart disease (age of onset: 39) in his mother; Hyperlipidemia in his father and mother.  Laboratory Chemistry Profile   Renal Lab Results  Component Value Date   BUN 15 08/31/2020   CREATININE 0.87 08/31/2020   BCR 13 02/15/2016   GFR 91.30 08/31/2020   GFRAA >60 02/22/2017   GFRNONAA >60 08/20/2020    Hepatic Lab Results  Component Value Date   AST 18 08/31/2020   ALT 21 08/31/2020   ALBUMIN 4.0 08/31/2020   ALKPHOS 66 08/31/2020   HCVAB NEGATIVE 09/04/2015    Electrolytes Lab Results  Component Value Date   NA 140 08/31/2020   K 4.4 08/31/2020   CL 102 08/31/2020   CALCIUM 9.6 08/31/2020   MG 2.0 11/30/2015    Bone Lab Results  Component Value Date   TESTOSTERONE 485 11/22/2016    Inflammation (CRP: Acute Phase) (ESR: Chronic Phase) Lab Results  Component Value Date   CRP 1.2 (H) 01/04/2021   ESRSEDRATE 8 01/04/2021         Note: Above Lab results reviewed.  Recent Imaging Review  DG PAIN CLINIC C-ARM 1-60 MIN NO REPORT Fluoro was used, but no Radiologist interpretation will be provided.  Please refer to "NOTES" tab for provider progress note. Note: Reviewed        Physical Exam  General appearance: Well nourished, well developed, and  well hydrated. In no apparent acute distress Mental status: Alert, oriented x 3 (person, place, & time)       Respiratory: No evidence of acute respiratory distress Eyes: PERLA Vitals: BP 128/71    Pulse 83    Temp (!) 97.2 F (36.2 C)    Resp 18    Ht _0  (1.778 m)    Wt 194 lb (88 kg)    SpO2 100%    BMI 27.84 kg/m  BMI: Estimated body mass index is 27.84 kg/m as calculated from the following:   Height as of this encounter: _1  (1.778 m).   Weight as of this encounter: 194 lb (88 kg). Ideal: Ideal body weight: 73 kg (160 lb 15 oz) Adjusted ideal body weight: 79 kg (174 lb 2.6 oz)  Assessment   Status Diagnosis  Controlled Controlled Controlled 1. Cervicalgia   2. Cervical facet syndrome (Bilateral)   3. Cervicogenic headache (3ry area of Pain) (Bilateral)   4. Chronic upper extremity pain (2ry area of Pain) (Bilateral) (L>R)   5. Abnormal MRI, cervical spine (11/18/2017)   6. 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 Cervical retrolisthesis of C4 over C5   8. Chronic cervical radiculopathy (C7, C8) (Left)   9. Claustrophobia   10. History of claustrophobia  11. Anxiety due to invasive procedure   12. Thoracic compression fracture, sequela (T1)      Updated Problems: Problem  Thoracic compression fracture, sequela (T1)   T1 compression fracture and spinous process avulsion.   Abnormal MRI, cervical spine (11/18/2017)   (11/18/2017) CERVICAL MRI FINDINGS: Vertebrae: Bold T1 compression fracture and spinous process avulsion. Cord: Slight flattening C4-C5.   DISC LEVELS: C3-4:  Disc desiccation. C4-5: Disc space narrowing. Osseous spurring. Annular bulge. LEFT C5 foraminal narrowing. C5-6: Slight disc space narrowing. Annular bulge with osseous spurring. Mild asymmetric uncinate hypertrophy uncinate hypertrophy resulting in LEFT C6 foraminal narrowing.  IMPRESSION: Motion degraded examination is of marginal diagnostic utility. Ordinary spondylosis at  C4-5 and C5-6 on the LEFT. Correlate clinically for symptomatic LEFT C5 and/or LEFT C6 radicular symptoms. No significant malalignment or worrisome osseous lesion. Mild stenosis at C4-5 without definite abnormal cord signal.   Claustrophobia  History of Claustrophobia    Plan of Care  Problem-specific:  No problem-specific Assessment & Plan notes found for this encounter.  Mr. Osten Janek. has a current medication list which includes the following long-term medication(s): albuterol, atorvastatin, escitalopram, advair hfa, loratadine, losartan, pantoprazole, and tadalafil.  Pharmacotherapy (Medications Ordered): Meds ordered this encounter  Medications   diazepam (VALIUM) 5 MG tablet    Sig: Take 1 tablet (5 mg total) by mouth as needed for up to 2 doses for anxiety (Take one tab 45 minutes before MRI. Take second tablet just prior to MRI scan). Do not take medication within 4 hours of taking opioid pain medications. Must have a driver. Do not drive or operate machinery x 24 hours after taking this medication.    Dispense:  2 tablet    Refill:  0    Must have a driver. Do not drive or operate machinery x 24 hours after taking this medication.   Orders:  Orders Placed This Encounter  Procedures   Cervical Epidural Injection    Sedation: Patient's choice. Purpose: Diagnostic/Therapeutic Indication(s): Radiculitis and cervicalgia associater with cervical degenerative disc disease.    Standing Status:   Future    Standing Expiration Date:   05/05/2021    Scheduling Instructions:     Procedure: Cervical Epidural Steroid Injection/Block     Level(s): C7-T1     Laterality: Left-sided     Timeframe: As soon as schedule allows    Order Specific Question:   Where will this procedure be performed?    Answer:   [facility]    Comments:   by Dr. Dossie Arbour   MR CERVICAL SPINE WO CONTRAST    Patient presents with axial pain with possible radicular component. Please assist [place] in  identifying specific level(s) and laterality of any additional findings such as: 1. Facet (Zygapophyseal) joint DJD (Hypertrophy, space narrowing, subchondral sclerosis, and/or osteophyte formation) 2. DDD and/or IVDD (Loss of disc height, desiccation, gas patterns, osteophytes, endplate sclerosis, or "Black disc disease") 3. Pars defects 4. Spondylolisthesis, spondylosis, and/or spondyloarthropathies (include Degree/Grade of displacement in mm) (stability) 5. Vertebral body Fractures (acute/chronic) (state percentage of collapse) 6. Demineralization (osteopenia/osteoporotic) 7. Bone pathology 8. Foraminal narrowing  9. Surgical changes 10. Central, Lateral Recess, and/or Foraminal Stenosis (include AP diameter of stenosis in mm) 11. Surgical changes (hardware type, status, and presence of fibrosis) 12. Modic Type Changes (MRI only) 13. IVDD (Disc bulge, protrusion, herniation, extrusion) (Level, laterality, extent)    Standing Status:   Future    Standing Expiration Date:   03/07/2021  Scheduling Instructions:     The patient is claustrophobic and he needs an open MRI.  Imaging must be done as soon as possible. Inform patient that order will expire within 30 days and I will not renew it.    Order Specific Question:   What is the patient's sedation requirement?    Answer:   No Sedation    Order Specific Question:   Does the patient have a pacemaker or implanted devices?    Answer:   No    Order Specific Question:   Preferred imaging location?    Answer:   Internal    Order Specific Question:   Call Results- Best Contact Number?    Answer:   [phone] ([facility])    Order Specific Question:   Radiology Contrast Protocol - do NOT remove file path    Answer:   [email or web address]   NCV with EMG(electromyography)    Bilateral testing requested.    Standing Status:   Future    Standing Expiration Date:   04/06/2021    Scheduling Instructions:      Please refer this patient to [facility] for Nerve Conduction testing of the upper extremities. (EMG & PNCV)    Order Specific Question:   Where should this test be performed?    Answer:   other    Order Specific Question:   Release to patient    Answer:   Immediate   Follow-up plan:   Return for (Clinic) procedure: (L) CESI #1, (Sed-anx).     Interventional Therapies  Risk   Complexity Considerations:   Estimated body mass index is 27.98 kg/m as calculated from the following:   Height as of this encounter: _0  (1.778 m).   Weight as of this encounter: 195 lb (88.5 kg). Anxiety    Planned   Pending:   Diagnostic left cervical ESI #1    Under consideration:   Diagnostic bilateral cervical facet MBB #2  Diagnostic left cervical ESI #1    Completed:   Diagnostic bilateral cervical facet MBB x1 (01/19/2021) (50/50/0/0)    Therapeutic   Palliative (PRN) options:   None established    Recent Visits Date Type Provider Dept  01/19/21 Procedure visit Milinda Pointer, MD [facility]  01/04/21 Office Visit Milinda Pointer, MD [facility]  Showing recent visits within past 90 days and meeting all other requirements Today's Visits Date Type Provider Dept  02/04/21 Office Visit Milinda Pointer, MD [facility]  Showing today's visits and meeting all other requirements Future Appointments Date Type Provider Dept  02/09/21 Appointment Milinda Pointer, MD [facility]  Showing future appointments within next 90 days and meeting all other requirements  I discussed the assessment and treatment plan with the patient. The patient was provided an opportunity to ask questions and all were answered. The patient agreed with the plan and demonstrated an understanding of the instructions.  Patient advised to call back or seek an in-person evaluation if the symptoms or condition worsens.  Duration of encounter: 45 minutes.  Note  by: Gaspar Cola, MD Date: 02/04/2021; Time: 10:45 AM

## 2021-02-04 ENCOUNTER — Encounter: Payer: Self-pay | Admitting: Pain Medicine

## 2021-02-04 ENCOUNTER — Other Ambulatory Visit: Payer: Self-pay

## 2021-02-04 ENCOUNTER — Ambulatory Visit: Payer: Medicare Other | Attending: Pain Medicine | Admitting: Pain Medicine

## 2021-02-04 VITALS — BP 128/71 | HR 83 | Temp 97.2°F | Resp 18 | Ht 70.0 in | Wt 194.0 lb

## 2021-02-04 DIAGNOSIS — M542 Cervicalgia: Secondary | ICD-10-CM | POA: Diagnosis not present

## 2021-02-04 DIAGNOSIS — G8929 Other chronic pain: Secondary | ICD-10-CM

## 2021-02-04 DIAGNOSIS — M5412 Radiculopathy, cervical region: Secondary | ICD-10-CM | POA: Diagnosis not present

## 2021-02-04 DIAGNOSIS — G4486 Cervicogenic headache: Secondary | ICD-10-CM

## 2021-02-04 DIAGNOSIS — M79602 Pain in left arm: Secondary | ICD-10-CM

## 2021-02-04 DIAGNOSIS — M4802 Spinal stenosis, cervical region: Secondary | ICD-10-CM | POA: Diagnosis not present

## 2021-02-04 DIAGNOSIS — M47812 Spondylosis without myelopathy or radiculopathy, cervical region: Secondary | ICD-10-CM

## 2021-02-04 DIAGNOSIS — M79601 Pain in right arm: Secondary | ICD-10-CM

## 2021-02-04 DIAGNOSIS — S22000S Wedge compression fracture of unspecified thoracic vertebra, sequela: Secondary | ICD-10-CM | POA: Diagnosis not present

## 2021-02-04 DIAGNOSIS — Z8659 Personal history of other mental and behavioral disorders: Secondary | ICD-10-CM

## 2021-02-04 DIAGNOSIS — R937 Abnormal findings on diagnostic imaging of other parts of musculoskeletal system: Secondary | ICD-10-CM

## 2021-02-04 DIAGNOSIS — M4310 Spondylolisthesis, site unspecified: Secondary | ICD-10-CM | POA: Diagnosis not present

## 2021-02-04 DIAGNOSIS — F419 Anxiety disorder, unspecified: Secondary | ICD-10-CM | POA: Diagnosis not present

## 2021-02-04 DIAGNOSIS — F40240 Claustrophobia: Secondary | ICD-10-CM | POA: Diagnosis not present

## 2021-02-04 MED ORDER — DIAZEPAM 5 MG PO TABS: 5.0000 mg | ORAL_TABLET | ORAL | 0 refills | Status: DC | PRN

## 2021-02-04 NOTE — Progress Notes (Signed)
Safety precautions to be maintained throughout the outpatient stay will include: orient to surroundings, keep bed in low position, maintain call bell within reach at all times, provide assistance with transfer out of bed and ambulation.  

## 2021-02-04 NOTE — Patient Instructions (Signed)
______________________________________________________________________ ° °Preparing for Procedure with Sedation ° °NOTICE: Due to recent regulatory changes, starting on September 07, 2020, procedures requiring intravenous (IV) sedation will no longer be performed at the [facility].  These types of procedures are required to be performed at [facility] ambulatory surgery facility.  We are very sorry for the inconvenience. ° °Procedure appointments are limited to planned procedures: °No Prescription Refills. °No disability issues will be discussed. °No medication changes will be discussed. ° °Instructions: °Oral Intake: Do not eat or drink anything for at least 8 hours prior to your procedure. (Exception: Blood Pressure Medication. See below.) °Transportation: A driver is required. You may not drive yourself after the procedure. °Blood Pressure Medicine: Do not forget to take your blood pressure medicine with a sip of water the morning of the procedure. If your Diastolic (lower reading) is above 100 mmHg, elective cases will be cancelled/rescheduled. °Blood thinners: These will need to be stopped for procedures. Notify our staff if you are taking any blood thinners. Depending on which one you take, there will be specific instructions on how and when to stop it. °Diabetics on insulin: Notify the staff so that you can be scheduled 1st case in the morning. If your diabetes requires high dose insulin, take only ½ of your normal insulin dose the morning of the procedure and notify the staff that you have done so. °Preventing infections: Shower with an antibacterial soap the morning of your procedure. °Build-up your immune system: Take 1000 mg of Vitamin C with every meal (3 times a day) the day prior to your procedure. °Antibiotics: Inform the staff if you have a condition or reason that requires you to take antibiotics before dental procedures. °Pregnancy: If you are pregnant, call and cancel the procedure. °Sickness: If  you have a cold, fever, or any active infections, call and cancel the procedure. °Arrival: You must be in the facility at least 30 minutes prior to your scheduled procedure. °Children: Do not bring children with you. °Dress appropriately: Bring dark clothing that you would not mind if they get stained. °Valuables: Do not bring any jewelry or valuables. ° °Reasons to call and reschedule or cancel your procedure: (Following these recommendations will minimize the risk of a serious complication.) °Surgeries: Avoid having procedures within 2 weeks of any surgery. (Avoid for 2 weeks before or after any surgery). °Flu Shots: Avoid having procedures within 2 weeks of a flu shots. (Avoid for 2 weeks before or after immunizations). °Barium: Avoid having a procedure within 7-10 days after having had a radiological study involving the use of radiological contrast. (Myelograms, Barium swallow or enema study). °Heart attacks: Avoid any elective procedures or surgeries for the initial 6 months after a "Myocardial Infarction" (Heart Attack). °Blood thinners: It is imperative that you stop these medications before procedures. Let us know if you if you take any blood thinner.  °Infection: Avoid procedures during or within two weeks of an infection (including chest colds or gastrointestinal problems). Symptoms associated with infections include: Localized redness, fever, chills, night sweats or profuse sweating, burning sensation when voiding, cough, congestion, stuffiness, runny nose, sore throat, diarrhea, nausea, vomiting, cold or Flu symptoms, recent or current infections. It is specially important if the infection is over the area that we intend to treat. °Heart and lung problems: Symptoms that may suggest an active cardiopulmonary problem include: cough, chest pain, breathing difficulties or shortness of breath, dizziness, ankle swelling, uncontrolled high or unusually low blood pressure, and/or palpitations. If you are    experiencing any of these symptoms, cancel your procedure and contact your primary care physician for an evaluation.  Remember:  Regular Business hours are:  Monday to Thursday 8:00 AM to 4:00 PM  Provider's Schedule: Milinda Pointer, MD:  Procedure days: Tuesday and Thursday 7:30 AM to 4:00 PM  Gillis Santa, MD:  Procedure days: Monday and Wednesday 7:30 AM to 4:00 PM ______________________________________________________________________  Epidural Steroid Injection Patient Information  Description: The epidural space surrounds the nerves as they exit the spinal cord.  In some patients, the nerves can be compressed and inflamed by a bulging disc or a tight spinal canal (spinal stenosis).  By injecting steroids into the epidural space, we can bring irritated nerves into direct contact with a potentially helpful medication.  These steroids act directly on the irritated nerves and can reduce swelling and inflammation which often leads to decreased pain.  Epidural steroids may be injected anywhere along the spine and from the neck to the low back depending upon the location of your pain.   After numbing the skin with local anesthetic (like Novocaine), a small needle is passed into the epidural space slowly.  You may experience a sensation of pressure while this is being done.  The entire block usually last less than 10 minutes.  Conditions which may be treated by epidural steroids:  Low back and leg pain Neck and arm pain Spinal stenosis Post-laminectomy syndrome Herpes zoster (shingles) pain Pain from compression fractures  Preparation for the injection:  Do not eat any solid food or dairy products within 8 hours of your appointment.  You may drink clear liquids up to 3 hours before appointment.  Clear liquids include water, black coffee, juice or soda.  No milk or cream please. You may take your regular medication, including pain medications, with a sip of water before your  appointment  Diabetics should hold regular insulin (if taken separately) and take 1/2 normal NPH dos the morning of the procedure.  Carry some sugar containing items with you to your appointment. A driver must accompany you and be prepared to drive you home after your procedure.  Bring all your current medications with your. An IV may be inserted and sedation may be given at the discretion of the physician.   A blood pressure cuff, EKG and other monitors will often be applied during the procedure.  Some patients may need to have extra oxygen administered for a short period. You will be asked to provide medical information, including your allergies, prior to the procedure.  We must know immediately if you are taking blood thinners (like Coumadin/Warfarin)  Or if you are allergic to IV iodine contrast (dye). We must know if you could possible be pregnant.  Possible side-effects: Bleeding from needle site Infection (rare, may require surgery) Nerve injury (rare) Numbness & tingling (temporary) Difficulty urinating (rare, temporary) Spinal headache ( a headache worse with upright posture) Light -headedness (temporary) Pain at injection site (several days) Decreased blood pressure (temporary) Weakness in arm/leg (temporary) Pressure sensation in back/neck (temporary)  Call if you experience: Fever/chills associated with headache or increased back/neck pain. Headache worsened by an upright position. New onset weakness or numbness of an extremity below the injection site Hives or difficulty breathing (go to the emergency room) Inflammation or drainage at the infection site Severe back/neck pain Any new symptoms which are concerning to you  Please note:  Although the local anesthetic injected can often make your back or neck feel good for several hours after  the injection, the pain will likely return.  It takes 3-7 days for steroids to work in the epidural space.  You may not notice any pain  relief for at least that one week.  If effective, we will often do a series of three injections spaced 3-6 weeks apart to maximally decrease your pain.  After the initial series, we generally will wait several months before considering a repeat injection of the same type.  If you have any questions, please call [phone] [facility]

## 2021-02-08 NOTE — Progress Notes (Signed)
PROVIDER NOTE: Interpretation of information contained herein should be left to medically-trained personnel. Specific patient instructions are provided elsewhere under "Patient Instructions" section of medical record. This document was created in part using STT-dictation technology, any transcriptional errors that may result from this process are unintentional.  Patient: [name]. Type: Established DOB: 1955-04-30 MRN: [id number] PCP: [name]  Service: Procedure DOS: 02/09/2021 Setting: Ambulatory Location: Ambulatory outpatient facility Delivery: Face-to-face Provider: Gaspar Cola, Ayala Specialty: Interventional Pain Management Specialty designation: 09 Location: Outpatient facility Ref. Prov.: [name]   Procedure [facility] Interventional Pain Management )   Interlaminar Cervical Epidural Steroid injection (ESI) #1  Laterality: Left Level: C7-T1 Imaging: Fluoroscopy-assisted DOS: 02/09/2021 Performed by: Tyler Pointer, Ayala Anesthesia: Local anesthesia (1-2% Lidocaine) Anxiolysis: None  Sedation: None.   Purpose: Diagnostic/Therapeutic Indications: Cervicalgia, cervical radicular pain, degenerative disc disease, severe enough to impact quality of life or function. 1. 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 Chronic cervical radiculopathy (C7, C8) (Left)   3. DDD (degenerative disc disease), cervical   4. Chronic upper extremity pain (2ry area of Pain) (Bilateral) (L>R)   5. Abnormal MRI, cervical spine (11/18/2017)    NAS-11 score:   Pre-procedure: 5 /10   Post-procedure: 1 /10     Pre-Procedure Preparation  Monitoring: As per clinic protocol. Respiration, ETCO2, SpO2, BP, heart rate and rhythm monitor placed and checked for adequate function  Risk Assessment: Vitals:  LGX:QJJHERDEY body mass index is 27.98 kg/m as calculated from the following:   Height as of this encounter: 5\' 10"  (1.778 m).   Weight as of this encounter:  195 lb (88.5 kg)., Rate:(!) 58ECG Heart Rate: 62, BP:134/78, Resp:16, Temp:(!) 97.1 F (36.2 C), SpO2:100 %  Allergies: He is allergic to other.  Precautions: None required  Blood-thinner(s): None at this time  Coagulopathies: Reviewed. None identified.   Active Infection(s): Reviewed. None identified. [name] is afebrile   Location setting: Procedure suite Position: Prone, on modified reverse trendelenburg to facilitate breathing, with head in head-cradle. Pillows positioned under chest (below chin-level) with cervical spine flexed. Safety Precautions: Patient was assessed for positional comfort and pressure points before starting the procedure. Prepping solution: DuraPrep (Iodine Povacrylex [0.7% available iodine] and Isopropyl Alcohol, 74% w/w) Prep Area: Entire  cervicothoracic region Approach: percutaneous, paramedial Intended target: Posterior cervical epidural space Materials: Tray: Epidural Needle(s): Epidural (Tuohy) Qty: 1 Length: (43mm) 3.5-inch Gauge: 17G   Meds ordered this encounter  Medications   iohexol (OMNIPAQUE) 180 MG/ML injection 10 mL    Must be Myelogram-compatible. If not available, you may substitute with a water-soluble, non-ionic, hypoallergenic, myelogram-compatible radiological contrast medium.   lidocaine (XYLOCAINE) 2 % (with pres) injection 400 mg   pentafluoroprop-tetrafluoroeth (GEBAUERS) aerosol   sodium chloride flush (NS) 0.9 % injection 1 mL   ropivacaine (PF) 2 mg/mL (0.2%) (NAROPIN) injection 1 mL   dexamethasone (DECADRON) injection 10 mg    Orders Placed This Encounter  Procedures   Cervical Epidural Injection    Indication(s): Radiculitis and cervicalgia associated with cervical degenerative disc disease. Position: Prone Imaging guidance: Fluoroscopy required. Contrast required unless contraindicated by allergy or severe CKD. Equipment & Materials: Epidural tray & needle.    Scheduling Instructions:     Procedure: Cervical Epidural  Steroid Injection/Block     Planned Level(s): C7-T1     Laterality: Left-sided     Anxiolysis: Patient's choice.     Timeframe: Today    Order Specific Question:   Where  will this procedure be performed?    Answer:   [facility]    Comments:   by Dr. Fortunato Curling [facility] C-ARM 1-60 MIN NO REPORT    Intraoperative interpretation by procedural physician at [place].    Standing Status:   Standing    Number of Occurrences:   1    Order Specific Question:   Reason for exam:    Answer:   Assistance in needle guidance and placement for procedures requiring needle placement in or near specific anatomical locations not easily accessible without such assistance.   Informed Consent Details: Physician/Practitioner Attestation; Transcribe to consent form and obtain patient signature    Nursing instructions: Transcribe to consent form and obtain patient signature. Always confirm laterality of pain with [name], before procedure.    Order Specific Question:   Physician/Practitioner attestation of informed consent for procedure/surgical case    Answer:   I, the physician/practitioner, attest that I have discussed with the patient the benefits, risks, side effects, alternatives, likelihood of achieving goals and potential problems during recovery for the procedure that I have provided informed consent.    Order Specific Question:   Procedure    Answer:   Cervical Epidural Steroid Injection (CESI) under fluoroscopic guidance    Order Specific Question:   Physician/Practitioner performing the procedure    Answer:   Tyler Dorn A. Dossie Arbour Ayala    Order Specific Question:   Indication/Reason    Answer:   Indications: Cervicalgia (neck pain), cervical radicular pain, radiculitis (arm/shoulder pain, numbness, and/or weakness), degenerative disc disease, severe enough to greatly impact quality of life or function.   Provide equipment / supplies at bedside    "Epidural Tray" (Disposable    single use) Catheter: NOT required    Standing Status:   Standing    Number of Occurrences:   1    Order Specific Question:   Specify    Answer:   Epidural Tray   Saline lock IV    SAFETY PRECAUTION: Please establish a "Saline Lock" IV access in case rapid IV access is required. Discontinue prior to discharge, but no sooner than 15 min after procedure.    Standing Status:   Standing    Number of Occurrences:   1     Time-out: 3329 I initiated and conducted the "Time-out" before starting the procedure, as per protocol. The patient was asked to participate by confirming the accuracy of the "Time Out" information. Verification of the correct person, site, and procedure were performed and confirmed by me, the nursing staff, and the patient. "Time-out" conducted as per Joint Commission's Universal Protocol (UP.01.01.01). Procedure checklist: Completed   H&P (Pre-op  Assessment)  Mr. Andrada is a 66 y.o. (year old), male patient, seen today for interventional treatment. He  has a past surgical history that includes Ganglion cyst excision (Right, 1994); Nasal septum surgery (2011); Cardiac catheterization (N/A, 01/04/2016); Cardiac catheterization (N/A, 02/17/2016); Atrial flutter ablation (02/17/2016); Colonoscopy (2014); Hernia repair (Right, 1994); and Colonoscopy with propofol (N/A, 06/30/2016). Mr. Jarboe has a current medication list which includes the following prescription(s): albuterol, alprazolam, atorvastatin, cyclobenzaprine, diazepam, escitalopram, finasteride, advair hfa, loratadine, losartan, meloxicam, pantoprazole, tadalafil, and trulance. His primarily concern today is the Neck Pain  He is allergic to other.   Last encounter: My last encounter with him was on 02/04/2021. Pertinent problems: Mr. Hommel has Cervical foraminal stenosis (C4-5, C5-6) (Left); Lipoma of back; Chronic low back pain (4th area of Pain) (Bilateral) (L>R)  w/o sciatica; Right knee pain; Neck swelling; Chronic cervical  radiculopathy (C7, C8) (Left); Bilateral leg numbness; Chronic upper extremity pain (2ry area of Pain) (Bilateral) (L>R); Chronic lower extremity pain (5th area of Pain) (Bilateral) (L>R); Chronic pain syndrome; Chronic neck pain (1ry area of Pain) (Bilateral) (L>R); Cervicogenic headache (3ry area of Pain) (Bilateral); Lumbosacral radiculopathy at S1 (Left); Chronic upper back pain (6th area of Pain); Cervical facet syndrome (Bilateral); Abnormal MRI, lumbar spine (11/18/2017); DDD (degenerative disc disease), lumbar; Lumbar facet arthropathy (Multilevel) (Bilateral); Lumbar facet syndrome; Abnormal MRI, cervical spine (11/18/2017); DDD (degenerative disc disease), cervical; Cervical facet hypertrophy; Cervical retrolisthesis of C4 over C5; Cervicalgia; Spondylosis without myelopathy or radiculopathy, cervical region; and Thoracic compression fracture, sequela (T1) on their pertinent problem list. Pain Assessment: Severity of Chronic pain is reported as a 5 /10. Location: Neck  /numbness down both arms and fingers. Onset: More than a month ago. Quality: Constant, Numbness, Discomfort, Tingling. Timing: Constant. Modifying factor(s): rest. Vitals:  height is 5\' 10"  (1.778 m) and weight is 195 lb (88.5 kg). His temporal temperature is 97.1 F (36.2 C) (abnormal). His blood pressure is 159/88 (abnormal) and his pulse is 58 (abnormal). His respiration is 15 and oxygen saturation is 100%.   Reason for encounter: Interventional pain management therapy due pain of at least four (4) weeks in duration, with to failure to respond to and/or inability to tolerate more conservative care.   Related imaging: Cervical MR wo contrast:  Results for orders placed during the hospital encounter of 11/18/17  MR Cervical Spine Wo Contrast  Narrative CLINICAL DATA:  Neck pain, LEFT arm pain.  EXAM: MRI CERVICAL SPINE WITHOUT CONTRAST  TECHNIQUE: Multiplanar, multisequence MR imaging of the cervical spine  was performed. No intravenous contrast was administered.  COMPARISON:  Motion degraded MRI scan 09/23/2017. CT scan 02/22/2017.  FINDINGS: Today's study is also significantly motion degraded despite best efforts of the technologist. Examination is of marginal diagnostic utility.  Alignment: Anatomic  Vertebrae: No acute fracture, evidence of discitis, or bone lesion. Bold T1 compression fracture and spinous process avulsion.  Cord: No definite signal abnormality.  Slight flattening C4-C5.  Posterior Fossa, vertebral arteries, paraspinal tissues: Unremarkable.  Disc levels:  C2-3:  Normal disc space.  No impingement.  C3-4:  Disc desiccation.  No impingement.  C4-5: Disc space narrowing. Osseous spurring. Annular bulge. LEFT C5 foraminal narrowing.  C5-6: Slight disc space narrowing. Annular bulge with osseous spurring. Mild asymmetric uncinate hypertrophy uncinate hypertrophy resulting in LEFT C6 foraminal narrowing.  C6-7:  Unremarkable.  C7-T1:  Unremarkable.  IMPRESSION: Motion degraded examination is of marginal diagnostic utility.  Ordinary spondylosis at C4-5 and C5-6 on the LEFT. Correlate clinically for symptomatic LEFT C5 and/or LEFT C6 radicular symptoms.  No significant malalignment or worrisome osseous lesion. Mild stenosis at C4-5 without definite abnormal cord signal.   Electronically Signed By: Staci Righter M.D. On: 11/18/2017 14:14  Cervical MR wo contrast: No valid procedures specified. Cervical MR w/wo contrast:  No results found for this or any previous visit.  Cervical CT wo contrast:  Results for orders placed during the hospital encounter of 02/22/17  CT Cervical Spine Wo Contrast  Narrative CLINICAL DATA:  Head trauma with subsequent neck pain.  EXAM: CT CERVICAL SPINE WITHOUT CONTRAST  TECHNIQUE: Multidetector CT imaging of the cervical spine was performed without intravenous contrast. Multiplanar CT image reconstructions  were also generated.  COMPARISON:  08/19/2016  FINDINGS: Alignment: Normal  Skull base and vertebrae: Normal. Chronic corticated  bone adjacent to the tip of the spinous process of T1, not significant.  Soft tissues and spinal canal: Normal except for small thyroid nodules.  Disc levels: Mild degenerative spondylosis at C4-5 and C5-6 with small osteophytes but no significant narrowing of the canal or foramina.  Upper chest: Mild pleural and parenchymal scarring.  Other: None  IMPRESSION: No significant cervical spine finding. Ordinary mild spondylosis at C4-5 and C5-6.   Electronically Signed By: Nelson Chimes M.D. On: 02/22/2017 16:36  Cervical DG complete:  Results for orders placed during the hospital encounter of 01/04/21  DG Cervical Spine Complete  Narrative CLINICAL DATA:  Chronic cervical pain.  EXAM: CERVICAL SPINE - COMPLETE 4+ VIEW  COMPARISON:  Chest x-ray 08/20/2020, 11/29/2015. MRI cervical spine 09/23/2017. CT cervical spine 02/22/2017.  FINDINGS: Diffuse osteopenia. Diffuse multilevel degenerative change with multilevel disc space loss and endplate osteophyte formation. Disc space loss most prominent at C4-C5 and C5-C6. Diffuse facet hypertrophy. No acute bony abnormality identified. Ligamentous ossification noted. Neuroforamen are patent. Pulmonary apices are clear.  IMPRESSION: Diffuse multilevel degenerative change. Degenerative changes most prominent at C4-C5 and C5-C6. No acute bony abnormality.   Electronically Signed By: Marcello Moores  Register M.D. On: 01/05/2021 05:06    Site Confirmation: Mr. Bays was asked to confirm the procedure and laterality before marking the site.  Consent: Before the procedure and under the influence of no sedative(s), amnesic(s), or anxiolytics, the patient was informed of the treatment options, risks and possible complications. To fulfill our ethical and legal obligations, as recommended by the American  Medical Association's Code of Ethics, I have informed the patient of my clinical impression; the nature and purpose of the treatment or procedure; the risks, benefits, and possible complications of the intervention; the alternatives, including doing nothing; the risk(s) and benefit(s) of the alternative treatment(s) or procedure(s); and the risk(s) and benefit(s) of doing nothing. The patient was provided information about the general risks and possible complications associated with the procedure. These may include, but are not limited to: failure to achieve desired goals, infection, bleeding, organ or nerve damage, allergic reactions, paralysis, and death. In addition, the patient was informed of those risks and complications associated to Spine-related procedures, such as failure to decrease pain; infection (i.e.: Meningitis, epidural or intraspinal abscess); bleeding (i.e.: epidural hematoma, subarachnoid hemorrhage, or any other type of intraspinal or peri-dural bleeding); organ or nerve damage (i.e.: Any type of peripheral nerve, nerve root, or spinal cord injury) with subsequent damage to sensory, motor, and/or autonomic systems, resulting in permanent pain, numbness, and/or weakness of one or several areas of the body; allergic reactions; (i.e.: anaphylactic reaction); and/or death. Furthermore, the patient was informed of those risks and complications associated with the medications. These include, but are not limited to: allergic reactions (i.e.: anaphylactic or anaphylactoid reaction(s)); adrenal axis suppression; blood sugar elevation that in diabetics may result in ketoacidosis or comma; water retention that in patients with history of congestive heart failure may result in shortness of breath, pulmonary edema, and decompensation with resultant heart failure; weight gain; swelling or edema; medication-induced neural toxicity; particulate matter embolism and blood vessel occlusion with resultant organ,  and/or nervous system infarction; and/or aseptic necrosis of one or more joints. Finally, the patient was informed that Medicine is not an exact science; therefore, there is also the possibility of unforeseen or unpredictable risks and/or possible complications that may result in a catastrophic outcome. The patient indicated having understood very clearly. We have given the patient no guarantees and  we have made no promises. Enough time was given to the patient to ask questions, all of which were answered to the patient's satisfaction. Mr. Gracia has indicated that he wanted to continue with the procedure. Attestation: I, the ordering provider, attest that I have discussed with the patient the benefits, risks, side-effects, alternatives, likelihood of achieving goals, and potential problems during recovery for the procedure that I have provided informed consent.  Date   Time: 02/09/2021  7:55 AM   Prophylactic antibiotics  Anti-infectives (From admission, onward)    None      Indication(s): None identified   Description of procedure   Start Time: 0821 hrs  Local Anesthesia: Once the patient was positioned, prepped, and time-out was completed. The target area was identified located. The skin was marked with an approved surgical skin marker. Once marked, the skin (epidermis, dermis, and hypodermis), and deeper tissues (fat, connective tissue and muscle) were infiltrated with a small amount of a short-acting local anesthetic, loaded on a 10cc syringe with a 25G, 1.5-in  Needle. An appropriate amount of time was allowed for local anesthetics to take effect before proceeding to the next step. Local Anesthetic: Lidocaine 1-2% The unused portion of the local anesthetic was discarded in the proper designated containers. Safety Precautions: Aspiration looking for blood return was conducted prior to all injections. At no point did I inject any substances, as a needle was being advanced. Before injecting, the  patient was told to immediately notify me if he was experiencing any new onset of "ringing in the ears, or metallic taste in the mouth". No attempts were made at seeking any paresthesias. Safe injection practices and needle disposal techniques used. Medications properly checked for expiration dates. SDV (single dose vial) medications used. After the completion of the procedure, all disposable equipment used was discarded in the proper designated medical waste containers.  Technical description: Protocol guidelines were followed. Using fluoroscopic guidance, the epidural needle was introduced through the skin, ipsilateral to the reported pain, and advanced to the target area. Posterior laminar os was contacted and the needle walked caudad, until the lamina was cleared. The ligamentum flavum was engaged and the epidural space identified using loss-of-resistance technique with 2-3 ml of PF-NaCl (0.9% NSS), in a 5cc dedicated LOR syringe. See "Imaging guidance" below for use of contrast details.  Injection: Once satisfactory needle placement was confirmed, I proceeded to inject the desired solution in slow, incremental fashion, intermittently assessing for discomfort or any signs of abnormal or undesired spread of substance. Once completed, the needle was removed and disposed of, as per hospital protocols.   Vitals:   02/09/21 0755 02/09/21 0822 02/09/21 0827  BP: 134/78 (!) 164/84 (!) 159/88  Pulse: (!) 58    Resp: 16 16 15   Temp: (!) 97.1 F (36.2 C)    TempSrc: Temporal    SpO2: 100% 100% 100%  Weight: 195 lb (88.5 kg)    Height: 5\' 10"  (1.778 m)      End Time: 0826 hrs  Once the entire procedure was completed, the treated area was cleaned, making sure to leave some of the prepping solution back to take advantage of its long term bactericidal properties.   Imaging guidance  Type of Imaging Technique: Fluoroscopy Guidance (Spinal) Indication(s): Assistance in needle guidance and placement  for procedures requiring needle placement in or near specific anatomical locations not easily accessible without such assistance. Exposure Time: Please see nurses notes for exact fluoroscopy time. Contrast: Before injecting any contrast, we  confirmed that the patient did not have an allergy to iodine, shellfish, or radiological contrast. Once satisfactory needle placement was completed, radiological contrast was injected under continuous fluoroscopic guidance. Injection of contrast accomplished without complications. See chart for type and volume of contrast used. Fluoroscopic Guidance: I was personally present in the fluoroscopy suite, where the patient was placed in position for the procedure, over the fluoroscopy-compatible table. Fluoroscopy was manipulated, using "Tunnel Vision Technique", to obtain the best possible view of the target area, on the affected side. Parallax error was corrected before commencing the procedure. A "direction-depth-direction" technique was used to introduce the needle under continuous pulsed fluoroscopic guidance. Once the target was reached, antero-posterior, oblique, and lateral fluoroscopic projection views were taken to confirm needle placement in all planes. Electronic images uploaded into EMR.  Interpretation: Successful epidural injection. Intraoperative imaging interpretation by performing Physician.    Post-op assessment  Post-procedure Vital Signs:  Pulse/HCG Rate: (!) 5860 Temp: (!) 97.1 F (36.2 C) Resp: 15 BP: (!) 159/88 SpO2: 100 %  EBL: None  Complications: No immediate post-treatment complications observed by team, or reported by patient.  Note: The patient tolerated the entire procedure well. A repeat set of vitals were taken after the procedure and the patient was kept under observation following institutional policy, for this type of procedure. Post-procedural neurological assessment was performed, showing return to baseline, prior to discharge.  The patient was provided with post-procedure discharge instructions, including a section on how to identify potential problems. Should any problems arise concerning this procedure, the patient was given instructions to immediately contact us, at any time, without hesitation. In any case, we plan to contact the patient by telephone for a follow-up status report regarding this interventional procedure.  Comments:  No additional relevant information.   Plan of care  Chronic Opioid Analgesic:  .   none MME/day: 0 mg/day   Medications administered: We administered iohexol, lidocaine, pentafluoroprop-tetrafluoroeth, sodium chloride flush, ropivacaine (PF) 2 mg/mL (0.2%), and dexamethasone.  Follow-up plan:   Return in about 2 weeks (around 02/23/2021) for Proc-day (T,Th), (F2F), (PPE).      Interventional Therapies  Risk   Complexity Considerations:   Estimated body mass index is 27.98 kg/m as calculated from the following:   Height as of this encounter: 5\' 10"  (1.778 m).   Weight as of this encounter: 195 lb (88.5 kg). Anxiety    Planned   Pending:   Diagnostic left cervical ESI #1 (02/09/2021)    Under consideration:   Diagnostic bilateral cervical facet MBB #2  Diagnostic left cervical ESI #1    Completed:   Diagnostic bilateral cervical facet MBB x1 (01/19/2021) (50/50/0/0)  Diagnostic left cervical ESI #1 (02/09/2021)    Therapeutic   Palliative (PRN) options:   None established    Recent Visits Date Type Provider Dept  02/04/21 Office Visit Tyler Pointer, Ayala [facility]  01/19/21 Procedure visit Tyler Pointer, Ayala [facility]  01/04/21 Office Visit Tyler Pointer, Ayala [facility]  Showing recent visits within past 90 days and meeting all other requirements Today's Visits Date Type Provider Dept  02/09/21 Procedure visit Tyler Pointer, Ayala [facility]  Showing today's visits and meeting all other requirements Future  Appointments Date Type Provider Dept  02/25/21 Appointment Tyler Pointer, Ayala [facility]  Showing future appointments within next 90 days and meeting all other requirements   Disposition: Discharge home  Discharge (Date   Time): 02/09/2021; 0833 hrs.   Primary Care Physician:  [name] Location: [facility] Note by: Tyler Cola, Ayala Date: 02/09/2021; Time: 8:40 AM  DISCLAIMER: Medicine is not an Chief Strategy Officer. It has no guarantees or warranties. The decision to proceed with this intervention was based on the information collected from the patient. Conclusions were drawn from the patient's questionnaire, interview, and examination. Because information was provided in large part by the patient, it cannot be guaranteed that it has not been purposely or unconsciously manipulated or altered. Every effort has been made to obtain as much accurate, relevant, available data as possible. Always take into account that the treatment will also be dependent on availability of resources and existing treatment guidelines, considered by other Pain Management Specialists as being common knowledge and practice, at the time of the intervention. It is also important to point out that variation in procedural techniques and pharmacological choices are the acceptable norm. For Medico-Legal review purposes, the indications, contraindications, technique, and results of the these procedures should only be evaluated, judged and interpreted by a Board-Certified Interventional Pain Specialist with extensive familiarity and expertise in the same exact procedure and technique.

## 2021-02-09 ENCOUNTER — Other Ambulatory Visit: Payer: Self-pay

## 2021-02-09 ENCOUNTER — Encounter: Payer: Self-pay | Admitting: Pain Medicine

## 2021-02-09 ENCOUNTER — Ambulatory Visit
Admission: RE | Admit: 2021-02-09 | Discharge: 2021-02-09 | Disposition: A | Discharge status: Home / Self Care | Payer: Medicare Other | Source: Ambulatory Visit | Attending: Pain Medicine | Admitting: Pain Medicine

## 2021-02-09 ENCOUNTER — Ambulatory Visit (HOSPITAL_BASED_OUTPATIENT_CLINIC_OR_DEPARTMENT_OTHER): Payer: Medicare Other | Admitting: Pain Medicine

## 2021-02-09 VITALS — BP 159/88 | HR 58 | Temp 97.1°F | Resp 15 | Ht 70.0 in | Wt 195.0 lb

## 2021-02-09 DIAGNOSIS — G8929 Other chronic pain: Secondary | ICD-10-CM | POA: Diagnosis not present

## 2021-02-09 DIAGNOSIS — M4802 Spinal stenosis, cervical region: Secondary | ICD-10-CM | POA: Insufficient documentation

## 2021-02-09 DIAGNOSIS — F419 Anxiety disorder, unspecified: Secondary | ICD-10-CM | POA: Insufficient documentation

## 2021-02-09 DIAGNOSIS — M79602 Pain in left arm: Secondary | ICD-10-CM | POA: Diagnosis not present

## 2021-02-09 DIAGNOSIS — R937 Abnormal findings on diagnostic imaging of other parts of musculoskeletal system: Secondary | ICD-10-CM | POA: Diagnosis not present

## 2021-02-09 DIAGNOSIS — M79601 Pain in right arm: Secondary | ICD-10-CM | POA: Insufficient documentation

## 2021-02-09 DIAGNOSIS — M5030 Other cervical disc degeneration, unspecified cervical region: Secondary | ICD-10-CM | POA: Insufficient documentation

## 2021-02-09 DIAGNOSIS — M5412 Radiculopathy, cervical region: Secondary | ICD-10-CM

## 2021-02-09 MED ORDER — LIDOCAINE HCL 2 % IJ SOLN
INTRAMUSCULAR | Status: AC
  Filled 2021-02-09: qty 20

## 2021-02-09 MED ORDER — LIDOCAINE HCL (PF) 1 % IJ SOLN
INTRAMUSCULAR | Status: AC
  Filled 2021-02-09: qty 5

## 2021-02-09 MED ORDER — DEXAMETHASONE SODIUM PHOSPHATE 10 MG/ML IJ SOLN
10.0000 mg | Freq: Once | INTRAMUSCULAR | Status: AC
  Administered 2021-02-09: 10 mg

## 2021-02-09 MED ORDER — PENTAFLUOROPROP-TETRAFLUOROETH EX AERO
INHALATION_SPRAY | Freq: Once | CUTANEOUS | Status: AC
  Administered 2021-02-09: 1 via TOPICAL
  Filled 2021-02-09: qty 116

## 2021-02-09 MED ORDER — SODIUM CHLORIDE (PF) 0.9 % IJ SOLN
INTRAMUSCULAR | Status: AC
  Filled 2021-02-09: qty 10

## 2021-02-09 MED ORDER — SODIUM CHLORIDE 0.9% FLUSH
1.0000 mL | Freq: Once | INTRAVENOUS | Status: AC
  Administered 2021-02-09: 1 mL

## 2021-02-09 MED ORDER — ROPIVACAINE HCL 2 MG/ML IJ SOLN
1.0000 mL | Freq: Once | INTRAMUSCULAR | Status: AC
  Administered 2021-02-09: 1 mL via EPIDURAL

## 2021-02-09 MED ORDER — ROPIVACAINE HCL 2 MG/ML IJ SOLN
INTRAMUSCULAR | Status: AC
  Filled 2021-02-09: qty 20

## 2021-02-09 MED ORDER — DEXAMETHASONE SODIUM PHOSPHATE 10 MG/ML IJ SOLN
INTRAMUSCULAR | Status: AC
  Filled 2021-02-09: qty 1

## 2021-02-09 MED ORDER — LIDOCAINE HCL 2 % IJ SOLN
20.0000 mL | Freq: Once | INTRAMUSCULAR | Status: AC
  Administered 2021-02-09: 200 mg

## 2021-02-09 MED ORDER — IOHEXOL 180 MG/ML  SOLN
10.0000 mL | Freq: Once | INTRAMUSCULAR | Status: AC
  Administered 2021-02-09: 5 mL via EPIDURAL

## 2021-02-09 NOTE — Patient Instructions (Signed)

## 2021-02-10 ENCOUNTER — Telehealth: Payer: Self-pay

## 2021-02-10 NOTE — Telephone Encounter (Signed)
Post procedure phone call.  Patient states he is doing good but did not sleep. States he will call back for any questions or concerns.

## 2021-02-15 ENCOUNTER — Telehealth: Payer: Self-pay | Admitting: Urology

## 2021-02-15 DIAGNOSIS — N138 Other obstructive and reflux uropathy: Secondary | ICD-10-CM

## 2021-02-15 DIAGNOSIS — N529 Male erectile dysfunction, unspecified: Secondary | ICD-10-CM

## 2021-02-15 NOTE — Telephone Encounter (Signed)
Pt would like to get a refill for Cialis.  He hasn't been seen in clinic a little over a year.  I told him he probably needs an appt w/[facility] before refilling.  He said he has so many appts and wanted to know if he could schedule a virtual.

## 2021-02-15 NOTE — Telephone Encounter (Signed)
He needs to be seen in office .

## 2021-02-17 ENCOUNTER — Ambulatory Visit
Admission: RE | Admit: 2021-02-17 | Discharge: 2021-02-17 | Disposition: A | Discharge status: Home / Self Care | Payer: Medicare Other | Source: Ambulatory Visit | Attending: Pain Medicine | Admitting: Pain Medicine

## 2021-02-17 ENCOUNTER — Other Ambulatory Visit: Payer: Self-pay

## 2021-02-17 DIAGNOSIS — M47812 Spondylosis without myelopathy or radiculopathy, cervical region: Secondary | ICD-10-CM | POA: Diagnosis not present

## 2021-02-17 DIAGNOSIS — M79601 Pain in right arm: Secondary | ICD-10-CM | POA: Diagnosis not present

## 2021-02-17 DIAGNOSIS — G4486 Cervicogenic headache: Secondary | ICD-10-CM | POA: Diagnosis not present

## 2021-02-17 DIAGNOSIS — M4310 Spondylolisthesis, site unspecified: Secondary | ICD-10-CM | POA: Insufficient documentation

## 2021-02-17 DIAGNOSIS — M79602 Pain in left arm: Secondary | ICD-10-CM | POA: Diagnosis not present

## 2021-02-17 DIAGNOSIS — M5412 Radiculopathy, cervical region: Secondary | ICD-10-CM | POA: Diagnosis not present

## 2021-02-17 DIAGNOSIS — G8929 Other chronic pain: Secondary | ICD-10-CM | POA: Insufficient documentation

## 2021-02-17 DIAGNOSIS — R937 Abnormal findings on diagnostic imaging of other parts of musculoskeletal system: Secondary | ICD-10-CM | POA: Insufficient documentation

## 2021-02-17 DIAGNOSIS — M4802 Spinal stenosis, cervical region: Secondary | ICD-10-CM | POA: Insufficient documentation

## 2021-02-17 DIAGNOSIS — M4312 Spondylolisthesis, cervical region: Secondary | ICD-10-CM | POA: Diagnosis not present

## 2021-02-17 DIAGNOSIS — M2578 Osteophyte, vertebrae: Secondary | ICD-10-CM | POA: Diagnosis not present

## 2021-02-17 DIAGNOSIS — M542 Cervicalgia: Secondary | ICD-10-CM | POA: Insufficient documentation

## 2021-02-22 ENCOUNTER — Other Ambulatory Visit: Payer: Self-pay | Admitting: Family Medicine

## 2021-02-22 DIAGNOSIS — E782 Mixed hyperlipidemia: Secondary | ICD-10-CM

## 2021-02-25 ENCOUNTER — Ambulatory Visit
Admission: RE | Admit: 2021-02-25 | Discharge: 2021-02-25 | Disposition: A | Discharge status: Home / Self Care | Payer: Medicare Other | Attending: Pain Medicine | Admitting: Pain Medicine

## 2021-02-25 ENCOUNTER — Encounter: Payer: Self-pay | Admitting: Pain Medicine

## 2021-02-25 ENCOUNTER — Other Ambulatory Visit: Payer: Self-pay | Admitting: Pain Medicine

## 2021-02-25 ENCOUNTER — Ambulatory Visit (HOSPITAL_BASED_OUTPATIENT_CLINIC_OR_DEPARTMENT_OTHER): Payer: Medicare Other | Admitting: Pain Medicine

## 2021-02-25 ENCOUNTER — Other Ambulatory Visit: Payer: Self-pay

## 2021-02-25 ENCOUNTER — Ambulatory Visit
Admission: RE | Admit: 2021-02-25 | Discharge: 2021-02-25 | Disposition: A | Discharge status: Home / Self Care | Payer: Medicare Other | Source: Ambulatory Visit | Attending: Pain Medicine | Admitting: Pain Medicine

## 2021-02-25 VITALS — BP 104/70 | HR 93 | Temp 97.3°F | Resp 16 | Ht 70.0 in | Wt 195.0 lb

## 2021-02-25 DIAGNOSIS — G8929 Other chronic pain: Secondary | ICD-10-CM | POA: Insufficient documentation

## 2021-02-25 DIAGNOSIS — M549 Dorsalgia, unspecified: Secondary | ICD-10-CM

## 2021-02-25 DIAGNOSIS — M546 Pain in thoracic spine: Secondary | ICD-10-CM | POA: Insufficient documentation

## 2021-02-25 DIAGNOSIS — M79602 Pain in left arm: Secondary | ICD-10-CM | POA: Insufficient documentation

## 2021-02-25 DIAGNOSIS — M4802 Spinal stenosis, cervical region: Secondary | ICD-10-CM

## 2021-02-25 DIAGNOSIS — M5412 Radiculopathy, cervical region: Secondary | ICD-10-CM

## 2021-02-25 DIAGNOSIS — M5030 Other cervical disc degeneration, unspecified cervical region: Secondary | ICD-10-CM

## 2021-02-25 DIAGNOSIS — M419 Scoliosis, unspecified: Secondary | ICD-10-CM | POA: Diagnosis not present

## 2021-02-25 DIAGNOSIS — M79601 Pain in right arm: Secondary | ICD-10-CM

## 2021-02-25 DIAGNOSIS — M4184 Other forms of scoliosis, thoracic region: Secondary | ICD-10-CM | POA: Insufficient documentation

## 2021-02-25 DIAGNOSIS — R937 Abnormal findings on diagnostic imaging of other parts of musculoskeletal system: Secondary | ICD-10-CM

## 2021-02-25 DIAGNOSIS — G4486 Cervicogenic headache: Secondary | ICD-10-CM | POA: Diagnosis not present

## 2021-02-25 DIAGNOSIS — M47812 Spondylosis without myelopathy or radiculopathy, cervical region: Secondary | ICD-10-CM

## 2021-02-25 DIAGNOSIS — M4310 Spondylolisthesis, site unspecified: Secondary | ICD-10-CM

## 2021-02-25 DIAGNOSIS — M542 Cervicalgia: Secondary | ICD-10-CM | POA: Insufficient documentation

## 2021-02-25 DIAGNOSIS — S22070A Wedge compression fracture of T9-T10 vertebra, initial encounter for closed fracture: Secondary | ICD-10-CM | POA: Diagnosis not present

## 2021-02-25 NOTE — Patient Instructions (Signed)
______________________________________________________________________  Preparing for Procedure with Sedation  NOTICE: Due to recent regulatory changes, starting on September 07, 2020, procedures requiring intravenous (IV) sedation will no longer be performed at the [facility].  These types of procedures are required to be performed at [facility] ambulatory surgery facility.  We are very sorry for the inconvenience.  Procedure appointments are limited to planned procedures: No Prescription Refills. No disability issues will be discussed. No medication changes will be discussed.  Instructions: Oral Intake: Do not eat or drink anything for at least 8 hours prior to your procedure. (Exception: Blood Pressure Medication. See below.) Transportation: A driver is required. You may not drive yourself after the procedure. Blood Pressure Medicine: Do not forget to take your blood pressure medicine with a sip of water the morning of the procedure. If your Diastolic (lower reading) is above 100 mmHg, elective cases will be cancelled/rescheduled. Blood thinners: These will need to be stopped for procedures. Notify our staff if you are taking any blood thinners. Depending on which one you take, there will be specific instructions on how and when to stop it. Diabetics on insulin: Notify the staff so that you can be scheduled 1st case in the morning. If your diabetes requires high dose insulin, take only  of your normal insulin dose the morning of the procedure and notify the staff that you have done so. Preventing infections: Shower with an antibacterial soap the morning of your procedure. Build-up your immune system: Take 1000 mg of Vitamin C with every meal (3 times a day) the day prior to your procedure. Antibiotics: Inform the staff if you have a condition or reason that requires you to take antibiotics before dental procedures. Pregnancy: If you are pregnant, call and cancel the procedure. Sickness: If  you have a cold, fever, or any active infections, call and cancel the procedure. Arrival: You must be in the facility at least 30 minutes prior to your scheduled procedure. Children: Do not bring children with you. Dress appropriately: Bring dark clothing that you would not mind if they get stained. Valuables: Do not bring any jewelry or valuables.  Reasons to call and reschedule or cancel your procedure: (Following these recommendations will minimize the risk of a serious complication.) Surgeries: Avoid having procedures within 2 weeks of any surgery. (Avoid for 2 weeks before or after any surgery). Flu Shots: Avoid having procedures within 2 weeks of a flu shots. (Avoid for 2 weeks before or after immunizations). Barium: Avoid having a procedure within 7-10 days after having had a radiological study involving the use of radiological contrast. (Myelograms, Barium swallow or enema study). Heart attacks: Avoid any elective procedures or surgeries for the initial 6 months after a "Myocardial Infarction" (Heart Attack). Blood thinners: It is imperative that you stop these medications before procedures. Let us know if you if you take any blood thinner.  Infection: Avoid procedures during or within two weeks of an infection (including chest colds or gastrointestinal problems). Symptoms associated with infections include: Localized redness, fever, chills, night sweats or profuse sweating, burning sensation when voiding, cough, congestion, stuffiness, runny nose, sore throat, diarrhea, nausea, vomiting, cold or Flu symptoms, recent or current infections. It is specially important if the infection is over the area that we intend to treat. Heart and lung problems: Symptoms that may suggest an active cardiopulmonary problem include: cough, chest pain, breathing difficulties or shortness of breath, dizziness, ankle swelling, uncontrolled high or unusually low blood pressure, and/or palpitations. If you are    experiencing any of these symptoms, cancel your procedure and contact your primary care physician for an evaluation.  Remember:  Regular Business hours are:  Monday to Thursday 8:00 AM to 4:00 PM  Provider's Schedule: Nikholas Geffre, MD:  Procedure days: Tuesday and Thursday 7:30 AM to 4:00 PM  Bilal Lateef, MD:  Procedure days: Monday and Wednesday 7:30 AM to 4:00 PM ______________________________________________________________________  ____________________________________________________________________________________________  General Risks and Possible Complications  Patient Responsibilities: It is important that you read this as it is part of your informed consent. It is our duty to inform you of the risks and possible complications associated with treatments offered to you. It is your responsibility as a patient to read this and to ask questions about anything that is not clear or that you believe was not covered in this document.  Patient's Rights: You have the right to refuse treatment. You also have the right to change your mind, even after initially having agreed to have the treatment done. However, under this last option, if you wait until the last second to change your mind, you may be charged for the materials used up to that point.  Introduction: Medicine is not an exact science. Everything in Medicine, including the lack of treatment(s), carries the potential for danger, harm, or loss (which is by definition: Risk). In Medicine, a complication is a secondary problem, condition, or disease that can aggravate an already existing one. All treatments carry the risk of possible complications. The fact that a side effects or complications occurs, does not imply that the treatment was conducted incorrectly. It must be clearly understood that these can happen even when everything is done following the highest safety standards.  No treatment: You can choose not to proceed with the  proposed treatment alternative. The "PRO(s)" would include: avoiding the risk of complications associated with the therapy. The "CON(s)" would include: not getting any of the treatment benefits. These benefits fall under one of three categories: diagnostic; therapeutic; and/or palliative. Diagnostic benefits include: getting information which can ultimately lead to improvement of the disease or symptom(s). Therapeutic benefits are those associated with the successful treatment of the disease. Finally, palliative benefits are those related to the decrease of the primary symptoms, without necessarily curing the condition (example: decreasing the pain from a flare-up of a chronic condition, such as incurable terminal cancer).  General Risks and Complications: These are associated to most interventional treatments. They can occur alone, or in combination. They fall under one of the following six (6) categories: no benefit or worsening of symptoms; bleeding; infection; nerve damage; allergic reactions; and/or death. No benefits or worsening of symptoms: In Medicine there are no guarantees, only probabilities. No healthcare provider can ever guarantee that a medical treatment will work, they can only state the probability that it may. Furthermore, there is always the possibility that the condition may worsen, either directly, or indirectly, as a consequence of the treatment. Bleeding: This is more common if the patient is taking a blood thinner, either prescription or over the counter (example: Goody Powders, Fish oil, Aspirin, Garlic, etc.), or if suffering a condition associated with impaired coagulation (example: Hemophilia, cirrhosis of the liver, low platelet counts, etc.). However, even if you do not have one on these, it can still happen. If you have any of these conditions, or take one of these drugs, make sure to notify your treating physician. Infection: This is more common in patients with a compromised  immune system, either due to disease (example:   diabetes, cancer, human immunodeficiency virus [HIV], etc.), or due to medications or treatments (example: therapies used to treat cancer and rheumatological diseases). However, even if you do not have one on these, it can still happen. If you have any of these conditions, or take one of these drugs, make sure to notify your treating physician. Nerve Damage: This is more common when the treatment is an invasive one, but it can also happen with the use of medications, such as those used in the treatment of cancer. The damage can occur to small secondary nerves, or to large primary ones, such as those in the spinal cord and brain. This damage may be temporary or permanent and it may lead to impairments that can range from temporary numbness to permanent paralysis and/or brain death. Allergic Reactions: Any time a substance or material comes in contact with our body, there is the possibility of an allergic reaction. These can range from a mild skin rash (contact dermatitis) to a severe systemic reaction (anaphylactic reaction), which can result in death. Death: In general, any medical intervention can result in death, most of the time due to an unforeseen complication. ____________________________________________________________________________________________  

## 2021-02-25 NOTE — Progress Notes (Signed)
PROVIDER NOTE: Information contained herein reflects review and annotations entered in association with encounter. Interpretation of such information and data should be left to medically-trained personnel. Information provided to patient can be located elsewhere in the medical record under "Patient Instructions". Document created using STT-dictation technology, any transcriptional errors that may result from process are unintentional.    Patient: [name].  Service Category: E/M  Provider: Gaspar Cola, MD  DOB: 01-Sep-1955  DOS: 02/25/2021  Specialty: Interventional Pain Management  MRN: [id number]  Setting: Ambulatory outpatient  PCP: [name], MD  Type: Established Patient    Referring Provider: [name], MD  Location: Office  Delivery: Face-to-face     HPI  Mr. [name]., a 66 y.o. year old male, is here today because of his Cervicalgia [M54.2]. Mr. Maalouf primary complain today is Neck Pain and Back Pain (middle) Last encounter: My last encounter with him was on 02/09/2021. Pertinent problems: Mr. 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); Lipoma of back; Chronic low back pain (4th area of Pain) (Bilateral) (L>R) w/o sciatica; Right knee pain; Neck swelling; Chronic cervical radiculopathy (C7, C8) (Left); Bilateral leg numbness; Chronic upper extremity pain (2ry area of Pain) (Bilateral) (L>R); Chronic lower extremity pain (5th area of Pain) (Bilateral) (L>R); Chronic pain syndrome; Chronic neck pain (1ry area of Pain) (Bilateral) (L>R); Cervicogenic headache (3ry area of Pain) (Bilateral); Lumbosacral radiculopathy at S1 (Left); Chronic upper back pain (6th area of Pain); Cervical facet syndrome (Bilateral); Abnormal MRI, lumbar spine (11/18/2017); DDD (degenerative disc disease), lumbar; Lumbar facet arthropathy (Multilevel) (Bilateral); Lumbar facet syndrome; Abnormal MRI, cervical spine (11/18/2017; 02/17/2021); DDD (degenerative disc disease),  cervical; Cervical facet hypertrophy; Cervical retrolisthesis of C4 over C5; Cervicalgia; Spondylosis without myelopathy or radiculopathy, cervical region; Thoracic compression fracture, sequela (T1); Levoscoliosis of thoracic spine; and Chronic midline thoracic back pain on their pertinent problem list. Pain Assessment: Severity of Chronic pain is reported as a 5 /10. Location: Neck  /numbness down both arms, worse on left side. Onset: More than a month ago. Quality: Constant, Aching. Timing: Constant. Modifying factor(s): nothing. Vitals:  height is 5' 10" (1.778 m) and weight is 195 lb (88.5 kg). His temporal temperature is 97.3 F (36.3 C) (abnormal). His blood pressure is 104/70 and his pulse is 93. His respiration is 16 and oxygen saturation is 98%.   Reason for encounter: post-procedure evaluation and assessment.  The patient indicates having attained no benefit, in terms of his neck pain, from the procedure.  Today we went over the results of his cervical MRI.  He has significant problems with anxiety.  We have explained to him that at this point there is no evidence that he is going to need any kind of cervical surgery.  Today the patient also complained of some upper back pain which is paravertebral, bilaterally, in the thoracic region.  Previously he had described this as his sixth area of pain.  Today I have reviewed prior MRIs of that area that was apparently done in 2007.  At this point, I will be ordering some x-rays of the thoracic spine to updates this information.  The pain in this region is paravertebral and he refers that pressure over those muscles tend to aggravate that pain.  Post-procedure evaluation    Interlaminar Cervical Epidural Steroid injection (ESI) #1  Laterality: Left Level: C7-T1 Imaging: Fluoroscopy-assisted DOS: 02/09/2021 Performed by: Milinda Pointer, MD Anesthesia: Local anesthesia (1-2% Lidocaine) Anxiolysis: None  Sedation: None.  Purpose:  Diagnostic/Therapeutic Indications: Cervicalgia, cervical radicular pain, degenerative disc disease, severe enough to impact quality of life or function. 1. 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 Chronic cervical radiculopathy (C7, C8) (Left)   3. DDD (degenerative disc disease), cervical   4. Chronic upper extremity pain (2ry area of Pain) (Bilateral) (L>R)   5. Abnormal MRI, cervical spine (11/18/2017)    NAS-11 score:   Pre-procedure: 5 /10   Post-procedure: 1 /10     Effectiveness:  Initial hour after procedure: 0 %. Subsequent 4-6 hours post-procedure: 0 %. Analgesia past initial 6 hours: 0 %. Ongoing improvement:  Analgesic: No benefit.  However, upon the patient's initial evaluation his second worst pain complaint was that of upper extremity pain.  Currently he admits that he is not having any pain in the upper extremities and the only thing that he has is numbness.  This would suggest that he might have attained 100% relief of the upper extremity pain, but since he is focused on his neck pain, that is what he is currently reporting as having had no benefit.  Today I spoke to him about this and he indicates that right now his primary is the neck pain.  For this reason, today we will go ahead and schedule him for a second diagnostic bilateral cervical facet block under fluoroscopic guidance and IV sedation.  Now that his arm pain is currently not an issue, he may be able to provide [place] with better information as to what happens when we block his cervical facets. Function: No benefit ROM: No benefit  Pharmacotherapy Assessment  Analgesic: .   none MME/day: 0 mg/day   Monitoring: [place] PMP: PDMP not reviewed this encounter.       Pharmacotherapy: No side-effects or adverse reactions reported. Compliance: No problems identified. Effectiveness: Clinically acceptable.  No notes on file  UDS:  No results found for: SUMMARY   ROS  Constitutional: Denies any fever or  chills Gastrointestinal: No reported hemesis, hematochezia, vomiting, or acute GI distress Musculoskeletal: Denies any acute onset joint swelling, redness, loss of ROM, or weakness Neurological: No reported episodes of acute onset apraxia, aphasia, dysarthria, agnosia, amnesia, paralysis, loss of coordination, or loss of consciousness  Medication Review  ALPRAZolam, Plecanatide, albuterol, atorvastatin, cyclobenzaprine, escitalopram, finasteride, fluticasone-salmeterol, loratadine, losartan, meloxicam, pantoprazole, and tadalafil  History Review  Allergy: Mr. Schlabach is allergic to other. Drug: Mr. Martelle  reports no history of drug use. Alcohol:  reports no history of alcohol use. Tobacco:  reports that he quit smoking about 30 years ago. His smoking use included cigarettes. He has a 20.00 pack-year smoking history. He has never used smokeless tobacco. Social: Mr. Serpa  reports that he quit smoking about 30 years ago. His smoking use included cigarettes. He has a 20.00 pack-year smoking history. He has never used smokeless tobacco. He reports that he does not drink alcohol and does not use drugs. 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 Surgical: Mr. Kozakiewicz  has a past surgical history that includes Ganglion cyst excision (Right, 1994); Nasal septum surgery (2011); Cardiac catheterization (N/A, 01/04/2016); Cardiac catheterization (N/A, 02/17/2016); Atrial  flutter ablation  (02/17/2016); Colonoscopy (2014); Hernia repair (Right, 1994); and Colonoscopy with propofol (N/A, 06/30/2016). Family: family history includes Cancer in his mother; Heart disease in his father; Heart disease (age of onset: 63) in his mother; Hyperlipidemia in his father and mother.  Laboratory Chemistry Profile   Renal Lab Results  Component Value Date   BUN 15 08/31/2020   CREATININE 0.87 08/31/2020   BCR 13 02/15/2016   GFR 91.30 08/31/2020   GFRAA >60 02/22/2017   GFRNONAA >60 08/20/2020    Hepatic Lab Results  Component Value Date   AST 18 08/31/2020   ALT 21 08/31/2020   ALBUMIN 4.0 08/31/2020   ALKPHOS 66 08/31/2020   HCVAB NEGATIVE 09/04/2015    Electrolytes Lab Results  Component Value Date   NA 140 08/31/2020   K 4.4 08/31/2020   CL 102 08/31/2020   CALCIUM 9.6 08/31/2020   MG 2.0 11/30/2015    Bone Lab Results  Component Value Date   TESTOSTERONE 485 11/22/2016    Inflammation (CRP: Acute Phase) (ESR: Chronic Phase) Lab Results  Component Value Date   CRP 1.2 (H) 01/04/2021   ESRSEDRATE 8 01/04/2021         Note: Above Lab results reviewed.  Recent Imaging Review  MR CERVICAL SPINE WO CONTRAST CLINICAL DATA:  Provided history: Cervicalgia. Cervical facet syndrome. Cervicogenic headache. Chronic pain of both upper extremities. Abnormal MRI, cervical spine. Foraminal stenosis of cervical region. Retrolisthesis. Radiculopathy of cervical region. Neck pain, chronic; neck pain, chronic, degenerative changes on x-ray; cervical radiculopathy, no red flags; cervicalgia. Additional history provided by scanning technologist: Patient reports neck pain which radiates down left arm with numbness for 15 years. Patient reports multiple prior falls.  EXAM: MRI CERVICAL SPINE WITHOUT CONTRAST  TECHNIQUE: Multiplanar, multisequence MR imaging of the cervical spine was performed. No intravenous contrast was administered.  COMPARISON:  Radiographs of the  cervical spine 01/04/2021. MRI of the cervical spine 11/18/2017.  FINDINGS: Alignment: Trace C4-C5 and C5-C6 grade 1 retrolisthesis.  Vertebrae: Redemonstrated mild chronic anterior wedging of the T1 and T2 vertebral bodies. Cervical vertebral body height is maintained. Marrow edema within the right C1 lateral mass (for instance as seen on series 7, image 3). Elsewhere, no significant marrow edema or focal suspicious osseous lesion is identified. Small hemangioma within the left T1 articular pillar and pedicle.  Cord: No signal abnormality identified within the cervical spinal cord.  Posterior Fossa, vertebral arteries, paraspinal tissues: No abnormality identified within included portions of the posterior fossa. Flow voids preserved within the imaged cervical vertebral arteries. Paraspinal soft tissues unremarkable.  Disc levels:  Unless otherwise stated, the level by level findings below have not significantly changed from the prior MRI of 11/18/2017.  Moderate disc degeneration at C4-C5 and C5-C6. No more than mild disc degeneration at the remaining levels.  C2-C3: Central posterior annular fissure. No significant disc herniation or stenosis.  C3-C4: Slight disc bulge. Mild facet arthrosis. No significant spinal canal or foraminal stenosis.  C4-C5: Trace grade 1 retrolisthesis. Posterior disc osteophyte complex with bilateral disc osteophyte ridge/uncinate hypertrophy. Mild facet hypertrophy. As before, the posterior disc osteophyte complex focally effaces the ventral thecal sac, contacting and minimally flattening the right ventral aspect of the spinal cord (series 8, image 12). However, the dorsal CSF space is maintained within the spinal canal. Mild bilateral neural foraminal narrowing (greater on the left).  C5-C6: Trace grade 1 retrolisthesis. Disc bulge. Left greater than right disc osteophyte ridge/uncinate hypertrophy. Mild facet arthrosis. No  significant  spinal canal stenosis. Bilateral neural foraminal narrowing (moderate right, severe left).  C6-C7: Small central disc protrusion eccentric to the left. Facet arthrosis (greater on the left). The disc protrusion results in mild focal effacement of ventral thecal sac (without spinal cord mass effect). No significant foraminal stenosis.  C7-T1: No significant disc herniation or stenosis.  IMPRESSION: Cervical spondylosis, as outlined and not significantly changed from the prior MRI of 11/18/2017. Findings are most notably as follows.  At C4-C5, there is moderate disc degeneration. A posterior disc osteophyte complex contacts and mildly flattens the right ventral aspect of the spinal cord. However, the dorsal CSF space is maintained within the spinal canal. Mild bilateral neural foraminal narrowing (greater on the left).  At C5-C6, there is moderate disc degeneration. Multifactorial bilateral neural foraminal narrowing (moderate right, severe left).  No more than mild relative spinal canal narrowing at the remaining levels.  Mild edema within the right C1 lateral mass, likely degenerative.  Trace grade 1 retrolisthesis at C4-C5 and C5-C6.  Redemonstrated mild chronic anterior wedging of the T1 and T2 vertebral bodies.  Electronically Signed   By: Kellie Simmering D.O.   On: 02/17/2021 09:22 Note: Reviewed        Physical Exam  General appearance: Well nourished, well developed, and well hydrated. In no apparent acute distress Mental status: Alert, oriented x 3 (person, place, & time)       Respiratory: No evidence of acute respiratory distress Eyes: PERLA Vitals: BP 104/70    Pulse 93    Temp (!) 97.3 F (36.3 C) (Temporal)    Resp 16    Ht 5' 10" (1.778 m)    Wt 195 lb (88.5 kg)    SpO2 98%    BMI 27.98 kg/m  BMI: Estimated body mass index is 27.98 kg/m as calculated from the following:   Height as of this encounter: 5' 10" (1.778 m).   Weight as of this encounter: 195 lb  (88.5 kg). Ideal: Ideal body weight: 73 kg (160 lb 15 oz) Adjusted ideal body weight: 79.2 kg (174 lb 9 oz)  Assessment   Status Diagnosis  Unimproved Unimproved Unimproved 1. Cervicalgia   2. Chronic upper extremity pain (2ry area of Pain) (Bilateral) (L>R)   3. 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 Chronic cervical radiculopathy (C7, C8) (Left)   5. DDD (degenerative disc disease), cervical   6. Cervical facet syndrome (Bilateral)   7. Cervical facet hypertrophy   8. Cervicogenic headache (3ry area of Pain) (Bilateral)   9. Cervical retrolisthesis of C4 over C5   10. Abnormal MRI, cervical spine (11/18/2017)   11. Levoscoliosis of thoracic spine   12. Chronic upper back pain (6th area of Pain)   13. Chronic midline thoracic back pain      Updated Problems: Problem  Levoscoliosis of Thoracic Spine  Chronic Midline Thoracic Back Pain  Abnormal MRI, cervical spine (11/18/2017; 02/17/2021)   (02/17/2021) CERVICAL MRI FINDINGS: Alignment: Trace C4-C5 and C5-C6 grade 1 retrolisthesis. Vertebrae: Redemonstrated mild chronic anterior wedging of the T1 and T2 vertebral bodies. Cervical vertebral body height is maintained. Marrow edema within the right C1 lateral mass (for instance as seen on series 7, image 3). Elsewhere, no significant marrow edema or focal suspicious osseous lesion is identified. Small hemangioma within the left T1 articular pillar and pedicle. Cord: No signal abnormality identified within the cervical spinal cord. Posterior Fossa, vertebral arteries, paraspinal tissues: No abnormality identified within included portions of the  posterior fossa. Flow voids preserved within the imaged cervical vertebral arteries. Paraspinal soft tissues unremarkable.  DISC LEVELS: Unless otherwise stated, the level by level findings below have not significantly changed from the prior MRI of 11/18/2017. Moderate disc degeneration at C4-C5 and C5-C6. No more than mild disc  degeneration at the remaining levels.  C2-3: Central posterior annular fissure. C3-4: Slight disc bulge. Mild facet arthrosis. C4-5: Trace grade 1 retrolisthesis. Posterior disc osteophyte complex with bilateral disc osteophyte ridge/uncinate hypertrophy. Mild facet hypertrophy. As before, the posterior disc osteophyte complex focally effaces the ventral thecal sac, contacting and minimally flattening the right ventral aspect of the spinal cord (series 8, image 12). However, the dorsal CSF space is maintained within the spinal canal. Mild bilateral neural foraminal narrowing (greater on the left). C5-6: Trace grade 1 retrolisthesis. Disc bulge. Left greater than right disc osteophyte ridge/uncinate hypertrophy. Mild facet arthrosis. Bilateral neural foraminal narrowing (moderate right, severe left). C6-7: Small central disc protrusion eccentric to the left. Facet arthrosis (greater on the left). The disc protrusion results in mild focal effacement of ventral thecal sac (without spinal cord mass effect).   IMPRESSION: Cervical spondylosis, as outlined and not significantly changed from the prior MRI of 11/18/2017. Findings are most notably as follows. At C4-C5, there is moderate disc degeneration. A posterior disc osteophyte complex contacts and mildly flattens the right ventral aspect of the spinal cord. However, the dorsal CSF space is maintained within the spinal canal. Mild bilateral neural foraminal narrowing (greater on the left). At C5-C6, there is moderate disc degeneration. Multifactorial bilateral neural foraminal narrowing (moderate right, severe left). No more than mild relative spinal canal narrowing at the remaining levels. Mild edema within the right C1 lateral mass, likely degenerative. Trace grade 1 retrolisthesis at C4-C5 and C5-C6.  Redemonstrated mild chronic anterior wedging of the T1 and T2 vertebral bodies.   (11/18/2017) CERVICAL MRI FINDINGS: Vertebrae: Bold T1 compression  fracture and spinous process avulsion. Cord: Slight flattening C4-C5.   DISC LEVELS: C3-4:  Disc desiccation. C4-5: Disc space narrowing. Osseous spurring. Annular bulge. LEFT C5 foraminal narrowing. C5-6: Slight disc space narrowing. Annular bulge with osseous spurring. Mild asymmetric uncinate hypertrophy uncinate hypertrophy resulting in LEFT C6 foraminal narrowing.  IMPRESSION: Motion degraded examination is of marginal diagnostic utility. Ordinary spondylosis at C4-5 and C5-6 on the LEFT. Correlate clinically for symptomatic LEFT C5 and/or LEFT C6 radicular symptoms. No significant malalignment or worrisome osseous lesion. Mild stenosis at C4-5 without definite abnormal cord signal.     Plan of Care  Problem-specific:  No problem-specific Assessment & Plan notes found for this encounter.  Mr. Alverto Shedd. has a current medication list which includes the following long-term medication(s): albuterol, atorvastatin, escitalopram, advair hfa, loratadine, losartan, pantoprazole, and tadalafil.  Pharmacotherapy (Medications Ordered): No orders of the defined types were placed in this encounter.  Orders:  Orders Placed This Encounter  Procedures   CERVICAL FACET (MEDIAL BRANCH NERVE BLOCK)     Standing Status:   Future    Standing Expiration Date:   05/26/2021    Scheduling Instructions:     Side: Bilateral     Level: C3-4, C4-5, C5-6 Facet joints (C3, C4, C5, C6, & C7 Medial Branch Nerves)     Sedation: Patient's choice.     Timeframe: As soon as schedule allows    Order Specific Question:   Where will this procedure be performed?    Answer:   [facility]   DG Thoracic Spine 4V  Patient presents with axial pain with possible radicular component. Please assist [place] in identifying specific level(s) and laterality of any additional findings such as: 1. Facet (Zygapophyseal) joint DJD (Hypertrophy, space narrowing, subchondral sclerosis, and/or osteophyte formation) 2.  DDD and/or IVDD (Loss of disc height, desiccation, gas patterns, osteophytes, endplate sclerosis, or "Black disc disease") 3. Pars defects 4. Spondylolisthesis, spondylosis, and/or spondyloarthropathies (include Degree/Grade of displacement in mm) (stability) 5. Vertebral body Fractures (acute/chronic) (state percentage of collapse) 6. Demineralization (osteopenia/osteoporotic) 7. Bone pathology 8. Foraminal narrowing  9. Surgical changes    Standing Status:   Future    Standing Expiration Date:   05/26/2021    Order Specific Question:   Reason for Exam (SYMPTOM  OR DIAGNOSIS REQUIRED)    Answer:   Upper back pain and/or thoracic spine pain.    Order Specific Question:   Preferred imaging location?    Answer:   [facility]    Order Specific Question:   Call Results- Best Contact Number?    Answer:   [phone] ([facility])   Follow-up plan:   Return for (ECT) procedure: (B) C-FCT Blk #2, ([facility]).     Interventional Therapies  Risk   Complexity Considerations:   Estimated body mass index is 27.98 kg/m as calculated from the following:   Height as of this encounter: 5' 10" (1.778 m).   Weight as of this encounter: 195 lb (88.5 kg). Anxiety    Planned   Pending:   Diagnostic bilateral cervical facet MBB #2    Under consideration:   Diagnostic bilateral cervical facet MBB #2  Diagnostic left cervical ESI #1    Completed:   Diagnostic bilateral cervical facet MBB x1 (01/19/2021) (50/50/0/0)  Diagnostic left cervical ESI x1 (02/09/2021) (0/0/0/0)    Therapeutic   Palliative (PRN) options:   None established     Recent Visits Date Type Provider Dept  02/09/21 Procedure visit Milinda Pointer, MD [facility]  02/04/21 Office Visit Milinda Pointer, MD [facility]  01/19/21 Procedure visit Milinda Pointer, MD [facility]  01/04/21 Office Visit Milinda Pointer, MD [facility]  Showing recent visits within past 90  days and meeting all other requirements Today's Visits Date Type Provider Dept  02/25/21 Office Visit Milinda Pointer, MD [facility]  Showing today's visits and meeting all other requirements Future Appointments No visits were found meeting these conditions. Showing future appointments within next 90 days and meeting all other requirements  I discussed the assessment and treatment plan with the patient. The patient was provided an opportunity to ask questions and all were answered. The patient agreed with the plan and demonstrated an understanding of the instructions.  Patient advised to call back or seek an in-person evaluation if the symptoms or condition worsens.  Duration of encounter: 30 minutes.  Note by: Gaspar Cola, MD Date: 02/25/2021; Time: 2:02 PM

## 2021-02-27 ENCOUNTER — Other Ambulatory Visit: Payer: Self-pay | Admitting: Family Medicine

## 2021-03-03 ENCOUNTER — Telehealth: Payer: Self-pay | Admitting: Urology

## 2021-03-03 NOTE — Telephone Encounter (Signed)
Pt last seen 2021, scheduled appt in march, ok to fill?

## 2021-03-03 NOTE — Telephone Encounter (Signed)
Pt is out of Cialis and wants to know if he can get a 90 day supply sent to [facility].  He has an upcoming appt w/Shannon 3/7.

## 2021-03-04 NOTE — Telephone Encounter (Signed)
Spoke with patient and advised results, he was being very difficult. Pt wanted ONLY 8:30 appts, and complained because his appt in march, we have several openings, he declined all.

## 2021-03-09 ENCOUNTER — Telehealth: Payer: Self-pay | Admitting: Pain Medicine

## 2021-03-09 NOTE — Telephone Encounter (Signed)
error 

## 2021-03-11 ENCOUNTER — Ambulatory Visit
Admission: RE | Admit: 2021-03-11 | Discharge: 2021-03-11 | Disposition: A | Discharge status: Home / Self Care | Payer: Medicare Other | Source: Ambulatory Visit | Attending: Pain Medicine | Admitting: Pain Medicine

## 2021-03-11 ENCOUNTER — Other Ambulatory Visit: Payer: Self-pay

## 2021-03-11 ENCOUNTER — Ambulatory Visit (HOSPITAL_BASED_OUTPATIENT_CLINIC_OR_DEPARTMENT_OTHER): Payer: Medicare Other | Admitting: Pain Medicine

## 2021-03-11 VITALS — BP 124/80 | HR 75 | Temp 97.3°F | Resp 15 | Ht 70.0 in | Wt 195.0 lb

## 2021-03-11 DIAGNOSIS — M542 Cervicalgia: Secondary | ICD-10-CM | POA: Insufficient documentation

## 2021-03-11 DIAGNOSIS — G8929 Other chronic pain: Secondary | ICD-10-CM

## 2021-03-11 DIAGNOSIS — M5030 Other cervical disc degeneration, unspecified cervical region: Secondary | ICD-10-CM

## 2021-03-11 DIAGNOSIS — G4486 Cervicogenic headache: Secondary | ICD-10-CM

## 2021-03-11 DIAGNOSIS — M4310 Spondylolisthesis, site unspecified: Secondary | ICD-10-CM | POA: Diagnosis not present

## 2021-03-11 DIAGNOSIS — M47812 Spondylosis without myelopathy or radiculopathy, cervical region: Secondary | ICD-10-CM | POA: Diagnosis not present

## 2021-03-11 MED ORDER — LIDOCAINE HCL (PF) 2 % IJ SOLN
INTRAMUSCULAR | Status: AC
  Filled 2021-03-11: qty 10

## 2021-03-11 MED ORDER — MIDAZOLAM HCL 5 MG/5ML IJ SOLN
INTRAMUSCULAR | Status: AC
  Filled 2021-03-11: qty 5

## 2021-03-11 MED ORDER — DEXAMETHASONE SODIUM PHOSPHATE 10 MG/ML IJ SOLN
INTRAMUSCULAR | Status: AC
  Filled 2021-03-11: qty 2

## 2021-03-11 MED ORDER — MIDAZOLAM HCL 5 MG/5ML IJ SOLN
0.5000 mg | Freq: Once | INTRAMUSCULAR | Status: AC
  Administered 2021-03-11: 2.5 mg via INTRAVENOUS

## 2021-03-11 MED ORDER — LACTATED RINGERS IV SOLN
1000.0000 mL | Freq: Once | INTRAVENOUS | Status: AC
  Administered 2021-03-11: 1000 mL via INTRAVENOUS

## 2021-03-11 MED ORDER — FENTANYL CITRATE (PF) 100 MCG/2ML IJ SOLN
INTRAMUSCULAR | Status: AC
  Filled 2021-03-11: qty 2

## 2021-03-11 MED ORDER — LIDOCAINE HCL 2 % IJ SOLN
20.0000 mL | Freq: Once | INTRAMUSCULAR | Status: AC
  Administered 2021-03-11: 100 mg

## 2021-03-11 MED ORDER — FENTANYL CITRATE (PF) 100 MCG/2ML IJ SOLN
25.0000 ug | INTRAMUSCULAR | Status: DC | PRN
  Administered 2021-03-11: 75 ug via INTRAVENOUS

## 2021-03-11 MED ORDER — ROPIVACAINE HCL 2 MG/ML IJ SOLN
18.0000 mL | Freq: Once | INTRAMUSCULAR | Status: AC
  Administered 2021-03-11: 18 mL via PERINEURAL

## 2021-03-11 MED ORDER — PENTAFLUOROPROP-TETRAFLUOROETH EX AERO
INHALATION_SPRAY | Freq: Once | CUTANEOUS | Status: DC
  Filled 2021-03-11: qty 116

## 2021-03-11 MED ORDER — ROPIVACAINE HCL 2 MG/ML IJ SOLN
INTRAMUSCULAR | Status: AC
  Filled 2021-03-11: qty 20

## 2021-03-11 MED ORDER — DEXAMETHASONE SODIUM PHOSPHATE 10 MG/ML IJ SOLN
20.0000 mg | Freq: Once | INTRAMUSCULAR | Status: AC
  Administered 2021-03-11: 20 mg

## 2021-03-11 NOTE — Progress Notes (Signed)
PROVIDER NOTE: Interpretation of information contained herein should be left to medically-trained personnel. Specific patient instructions are provided elsewhere under "Patient Instructions" section of medical record. This document was created in part using STT-dictation technology, any transcriptional errors that may result from this process are unintentional.  Patient: [name]. Type: Established DOB: 12-22-1955 MRN: [id number] PCP: [name], MD  Service: Procedure DOS: 03/11/2021 Setting: Ambulatory Location: Ambulatory outpatient facility Delivery: Face-to-face Provider: Gaspar Cola, MD Specialty: Interventional Pain Management Specialty designation: 09 Location: Outpatient facility Ref. Prov.: [name], MD    Primary Reason for Visit: Interventional Pain Management Treatment. CC: Neck Pain    Procedure:          Anesthesia, Analgesia, Anxiolysis:  Type: Cervical Facet Medial Branch Block(s)  #2  Primary Purpose: Diagnostic Region: Posterolateral cervical spine Level: C3, C4, C5, C6, & C7 Medial Branch Level(s). Injecting these levels blocks the C3-4, C4-5, C5-6, and C6-7 cervical facet joints. Laterality: Bilateral  Anesthesia: Local (1-2% Lidocaine)  Anxiolysis: None  Sedation: None  Guidance: Fluoroscopy           Position: Prone with head of the table raised to facilitate breathing.   1. Cervical facet syndrome (Bilateral)   2. Spondylosis without myelopathy or radiculopathy, cervical region   3. Cervical facet hypertrophy   4. Cervical retrolisthesis of C4 over C5   5. Cervicalgia   6. Cervicogenic headache (3ry area of Pain) (Bilateral)   7. Chronic neck pain (1ry area of Pain) (Bilateral) (L>R)   8. DDD (degenerative disc disease), cervical    NAS-11 Pain score:   Pre-procedure: 5 /10   Post-procedure: 5 /10     Pre-op H&P Assessment:  [name] is a 66 y.o. (year old), male patient, seen today for interventional treatment. He   has a past surgical history that includes Ganglion cyst excision (Right, 1994); Nasal septum surgery (2011); Cardiac catheterization (N/A, 01/04/2016); Cardiac catheterization (N/A, 02/17/2016); Atrial flutter ablation (02/17/2016); Colonoscopy (2014); Hernia repair (Right, 1994); and Colonoscopy with propofol (N/A, 06/30/2016). [name] has a current medication list which includes the following prescription(s): albuterol, alprazolam, atorvastatin, cyclobenzaprine, escitalopram, finasteride, advair hfa, loratadine, losartan, meloxicam, pantoprazole, tadalafil, and trulance, and the following Facility-Administered Medications: fentanyl and pentafluoroprop-tetrafluoroeth. His primarily concern today is the Neck Pain  Initial Vital Signs:  Pulse/HCG Rate: 75ECG Heart Rate: 75 Temp: (!) 97.3 F (36.3 C) Resp: 16 BP: 140/90 SpO2: 100 %  BMI: Estimated body mass index is 27.98 kg/m as calculated from the following:   Height as of this encounter: 5\' 10"  (1.778 m).   Weight as of this encounter: 195 lb (88.5 kg).  Risk Assessment: Allergies: Reviewed. He is allergic to other.  Allergy Precautions: None required Coagulopathies: Reviewed. None identified.  Blood-thinner therapy: None at this time Active Infection(s): Reviewed. None identified. [name] is afebrile  Site Confirmation: [name] was asked to confirm the procedure and laterality before marking the site Procedure checklist: Completed Consent: Before the procedure and under the influence of no sedative(s), amnesic(s), or anxiolytics, the patient was informed of the treatment options, risks and possible complications. To fulfill our ethical and legal obligations, as recommended by the American Medical Association's Code of Ethics, I have informed the patient of my clinical impression; the nature and purpose of the treatment or procedure; the risks, benefits, and possible complications of the intervention; the alternatives, including doing  nothing; the risk(s) and benefit(s) of the alternative treatment(s) or procedure(s); and the risk(s) and  benefit(s) of doing nothing. The patient was provided information about the general risks and possible complications associated with the procedure. These may include, but are not limited to: failure to achieve desired goals, infection, bleeding, organ or nerve damage, allergic reactions, paralysis, and death. In addition, the patient was informed of those risks and complications associated to Spine-related procedures, such as failure to decrease pain; infection (i.e.: Meningitis, epidural or intraspinal abscess); bleeding (i.e.: epidural hematoma, subarachnoid hemorrhage, or any other type of intraspinal or peri-dural bleeding); organ or nerve damage (i.e.: Any type of peripheral nerve, nerve root, or spinal cord injury) with subsequent damage to sensory, motor, and/or autonomic systems, resulting in permanent pain, numbness, and/or weakness of one or several areas of the body; allergic reactions; (i.e.: anaphylactic reaction); and/or death. Furthermore, the patient was informed of those risks and complications associated with the medications. These include, but are not limited to: allergic reactions (i.e.: anaphylactic or anaphylactoid reaction(s)); adrenal axis suppression; blood sugar elevation that in diabetics may result in ketoacidosis or comma; water retention that in patients with history of congestive heart failure may result in shortness of breath, pulmonary edema, and decompensation with resultant heart failure; weight gain; swelling or edema; medication-induced neural toxicity; particulate matter embolism and blood vessel occlusion with resultant organ, and/or nervous system infarction; and/or aseptic necrosis of one or more joints. Finally, the patient was informed that Medicine is not an exact science; therefore, there is also the possibility of unforeseen or unpredictable risks and/or possible  complications that may result in a catastrophic outcome. The patient indicated having understood very clearly. We have given the patient no guarantees and we have made no promises. Enough time was given to the patient to ask questions, all of which were answered to the patient's satisfaction. [name] has indicated that he wanted to continue with the procedure. Attestation: I, the ordering provider, attest that I have discussed with the patient the benefits, risks, side-effects, alternatives, likelihood of achieving goals, and potential problems during recovery for the procedure that I have provided informed consent. Date   Time: 03/11/2021  7:57 AM  Pre-Procedure Preparation:  Monitoring: As per clinic protocol. Respiration, ETCO2, SpO2, BP, heart rate and rhythm monitor placed and checked for adequate function Safety Precautions: Patient was assessed for positional comfort and pressure points before starting the procedure. Time-out: I initiated and conducted the "Time-out" before starting the procedure, as per protocol. The patient was asked to participate by confirming the accuracy of the "Time Out" information. Verification of the correct person, site, and procedure were performed and confirmed by me, the nursing staff, and the patient. "Time-out" conducted as per Joint Commission's Universal Protocol (UP.01.01.01). Time: 4944  Description of Procedure:          Laterality: Bilateral. The procedure was performed in identical fashion on both sides. Level: C3, C4, C5, C6, & C7 Medial Branch Level(s). Area Prepped: Posterior Cervico-thoracic Region DuraPrep (Iodine Povacrylex [0.7% available iodine] and Isopropyl Alcohol, 74% w/w) Safety Precautions: Aspiration looking for blood return was conducted prior to all injections. At no point did we inject any substances, as a needle was being advanced. Before injecting, the patient was told to immediately notify me if he was experiencing any new onset of  "ringing in the ears, or metallic taste in the mouth". No attempts were made at seeking any paresthesias. Safe injection practices and needle disposal techniques used. Medications properly checked for expiration dates. SDV (single dose vial) medications used. After the completion of the  procedure, all disposable equipment used was discarded in the proper designated medical waste containers. Local Anesthesia: Protocol guidelines were followed. The patient was positioned over the fluoroscopy table. The area was prepped in the usual manner. The time-out was completed. The target area was identified using fluoroscopy. A 12-in long, straight, sterile hemostat was used with fluoroscopic guidance to locate the targets for each level blocked. Once located, the skin was marked with an approved surgical skin marker. Once all sites were marked, the skin (epidermis, dermis, and hypodermis), as well as deeper tissues (fat, connective tissue and muscle) were infiltrated with a small amount of a short-acting local anesthetic, loaded on a 10cc syringe with a 25G, 1.5-in  Needle. An appropriate amount of time was allowed for local anesthetics to take effect before proceeding to the next step. Local Anesthetic: Lidocaine 2.0% The unused portion of the local anesthetic was discarded in the proper designated containers. Technical explanation of process:  C3 Medial Branch Nerve Block (MBB): The target area for the C3 dorsal medial articular branch is the lateral concave waist of the articular pillar of C3. Under fluoroscopic guidance, a Quincke needle was inserted until contact was made with os over the postero-lateral aspect of the articular pillar of C3 (target area). After negative aspiration for blood, 0.5 mL of the nerve block solution was injected without difficulty or complication. The needle was removed intact. C4 Medial Branch Nerve Block (MBB): The target area for the C4 dorsal medial articular branch is the lateral  concave waist of the articular pillar of C4. Under fluoroscopic guidance, a Quincke needle was inserted until contact was made with os over the postero-lateral aspect of the articular pillar of C4 (target area). After negative aspiration for blood, 0.5 mL of the nerve block solution was injected without difficulty or complication. The needle was removed intact. C5 Medial Branch Nerve Block (MBB): The target area for the C5 dorsal medial articular branch is the lateral concave waist of the articular pillar of C5. Under fluoroscopic guidance, a Quincke needle was inserted until contact was made with os over the postero-lateral aspect of the articular pillar of C5 (target area). After negative aspiration for blood, 0.5 mL of the nerve block solution was injected without difficulty or complication. The needle was removed intact. C6 Medial Branch Nerve Block (MBB): The target area for the C6 dorsal medial articular branch is the lateral concave waist of the articular pillar of C6. Under fluoroscopic guidance, a Quincke needle was inserted until contact was made with os over the postero-lateral aspect of the articular pillar of C6 (target area). After negative aspiration for blood, 0.5 mL of the nerve block solution was injected without difficulty or complication. The needle was removed intact. C7 Medial Branch Nerve Block (MBB): The target for the C7 dorsal medial articular branch lies on the superior-medial tip of the C7 transverse process. Under fluoroscopic guidance, a Quincke needle was inserted until contact was made with os over the postero-lateral aspect of the articular pillar of C7 (target area). After negative aspiration for blood, 0.5 mL of the nerve block solution was injected without difficulty or complication. The needle was removed intact. Procedural Needles: 22-gauge, 3.5-inch, Quincke needles used for all levels. Nerve block solution: 0.2% PF-Ropivacaine + Triamcinolone (40 mg/mL) diluted to a final  concentration of 4 mg of Triamcinolone/mL of Ropivacaine The unused portion of the solution was discarded in the proper designated containers.  Once the entire procedure was completed, the treated area was cleaned, making  sure to leave some of the prepping solution back to take advantage of its long term bactericidal properties.  Anatomy Reference Guide:       Vitals:   03/11/21 0855 03/11/21 0900 03/11/21 0905 03/11/21 0914  BP: 124/82  134/78 124/80  Pulse:      Resp: 16 18 16 15   Temp:    (!) 97.3 F (36.3 C)  TempSrc:    Temporal  SpO2: 100%  100% 100%  Weight:      Height:        Start Time: 0833 hrs. End Time: 0845 hrs.  Imaging Guidance (Spinal):          Type of Imaging Technique: Fluoroscopy Guidance (Spinal) Indication(s): Assistance in needle guidance and placement for procedures requiring needle placement in or near specific anatomical locations not easily accessible without such assistance. Exposure Time: Please see nurses notes. Contrast: None used. Fluoroscopic Guidance: I was personally present during the use of fluoroscopy. "Tunnel Vision Technique" used to obtain the best possible view of the target area. Parallax error corrected before commencing the procedure. "Direction-depth-direction" technique used to introduce the needle under continuous pulsed fluoroscopy. Once target was reached, antero-posterior, oblique, and lateral fluoroscopic projection used confirm needle placement in all planes. Images permanently stored in EMR. Interpretation: No contrast injected. I personally interpreted the imaging intraoperatively. Adequate needle placement confirmed in multiple planes. Permanent images saved into the patient's record.  Antibiotic Prophylaxis:   Anti-infectives (From admission, onward)    None      Indication(s): None identified  Post-operative Assessment:  Post-procedure Vital Signs:  Pulse/HCG Rate: 7565 Temp: (!) 97.3 F (36.3 C) Resp: 15 BP:  124/80 SpO2: 100 %  EBL: None  Complications: No immediate post-treatment complications observed by team, or reported by patient.  Note: The patient tolerated the entire procedure well. A repeat set of vitals were taken after the procedure and the patient was kept under observation following institutional policy, for this type of procedure. Post-procedural neurological assessment was performed, showing return to baseline, prior to discharge. The patient was provided with post-procedure discharge instructions, including a section on how to identify potential problems. Should any problems arise concerning this procedure, the patient was given instructions to immediately contact us, at any time, without hesitation. In any case, we plan to contact the patient by telephone for a follow-up status report regarding this interventional procedure.  Comments:  No additional relevant information.  Plan of Care  Orders:  Orders Placed This Encounter  Procedures   CERVICAL FACET (MEDIAL BRANCH NERVE BLOCK)     Scheduling Instructions:     Side: Bilateral     Level: C3-4, C4-5, C5-6 Facet joints (C3, C4, C5, C6, & C7 Medial Branch Nerves)     Sedation: Patient's choice.     Timeframe: Today    Order Specific Question:   Where will this procedure be performed?    Answer:   [facility]   DG [facility] C-ARM 1-60 MIN NO REPORT    Intraoperative interpretation by procedural physician at [place].    Standing Status:   Standing    Number of Occurrences:   1    Order Specific Question:   Reason for exam:    Answer:   Assistance in needle guidance and placement for procedures requiring needle placement in or near specific anatomical locations not easily accessible without such assistance.   Informed Consent Details: Physician/Practitioner Attestation; Transcribe to consent form and obtain patient signature  Note: Always confirm laterality of pain with [name], before  procedure. Transcribe to consent form and obtain patient signature.    Order Specific Question:   Physician/Practitioner attestation of informed consent for procedure/surgical case    Answer:   I, the physician/practitioner, attest that I have discussed with the patient the benefits, risks, side effects, alternatives, likelihood of achieving goals and potential problems during recovery for the procedure that I have provided informed consent.    Order Specific Question:   Procedure    Answer:   Bilateral Cervical facet block under fluoroscopic guidance.    Order Specific Question:   Physician/Practitioner performing the procedure    Answer:   Adysen Raphael A. Dossie Arbour, MD    Order Specific Question:   Indication/Reason    Answer:   Chronic neck pain secondary to cervical facet syndrome   Provide equipment / supplies at bedside    "Block Tray" (Disposable   single use) Needle type: SpinalSpinal Amount/quantity: 5 Size: Regular (3.5-inch) Gauge: 22G    Standing Status:   Standing    Number of Occurrences:   1    Order Specific Question:   Specify    Answer:   Block Tray   Chronic Opioid Analgesic:  .   none MME/day: 0 mg/day   Medications ordered for procedure: Meds ordered this encounter  Medications   lidocaine (XYLOCAINE) 2 % (with pres) injection 400 mg   pentafluoroprop-tetrafluoroeth (GEBAUERS) aerosol   lactated ringers infusion 1,000 mL   midazolam (VERSED) 5 MG/5ML injection 0.5-2 mg    Make sure Flumazenil is available in the pyxis when using this medication. If oversedation occurs, administer 0.2 mg IV over 15 sec. If after 45 sec no response, administer 0.2 mg again over 1 min; may repeat at 1 min intervals; not to exceed 4 doses (1 mg)   fentaNYL (SUBLIMAZE) injection 25-50 mcg    Make sure Narcan is available in the pyxis when using this medication. In the event of respiratory depression (RR< 8/min): Titrate NARCAN (naloxone) in increments of 0.1 to 0.2 mg IV at 2-3 minute  intervals, until desired degree of reversal.   ropivacaine (PF) 2 mg/mL (0.2%) (NAROPIN) injection 18 mL   dexamethasone (DECADRON) injection 20 mg   Medications administered: We administered lidocaine, lactated ringers, midazolam, fentaNYL, ropivacaine (PF) 2 mg/mL (0.2%), and dexamethasone.  See the medical record for exact dosing, route, and time of administration.  Follow-up plan:   Return in about 2 weeks (around 03/25/2021) for Proc-day (T,Th), (VV), (PPE).       Interventional Therapies  Risk   Complexity Considerations:   Estimated body mass index is 27.98 kg/m as calculated from the following:   Height as of this encounter: 5\' 10"  (1.778 m).   Weight as of this encounter: 195 lb (88.5 kg). Anxiety    Planned   Pending:   Diagnostic bilateral cervical facet MBB #2    Under consideration:   Diagnostic bilateral cervical facet MBB #2  Diagnostic left cervical ESI #1    Completed:   Diagnostic bilateral cervical facet MBB x1 (01/19/2021) (50/50/0/0)  Diagnostic left cervical ESI x1 (02/09/2021) (0/0/0/0)    Therapeutic   Palliative (PRN) options:   None established      Recent Visits Date Type Provider Dept  02/25/21 Office Visit Milinda Pointer, MD [facility]  02/09/21 Procedure visit Milinda Pointer, MD [facility]  02/04/21 Office Visit Milinda Pointer, MD [facility]  01/19/21 Procedure visit Milinda Pointer, MD [facility]  01/04/21 Office Visit Milinda Pointer, MD [facility]  Showing recent visits within past 90 days and meeting all other requirements Today's Visits Date Type Provider Dept  03/11/21 Procedure visit Milinda Pointer, MD [facility]  Showing today's visits and meeting all other requirements Future Appointments Date Type Provider Dept  03/25/21 Appointment Milinda Pointer, MD [facility]  Showing future appointments within next 90 days and meeting all  other requirements  Disposition: Discharge home  Discharge (Date   Time): 03/11/2021; 0920 hrs.   Primary Care Physician: [name], MD Location: [facility] Note by: Gaspar Cola, MD Date: 03/11/2021; Time: 10:17 AM  Disclaimer:  Medicine is not an Chief Strategy Officer. The only guarantee in medicine is that nothing is guaranteed. It is important to note that the decision to proceed with this intervention was based on the information collected from the patient. The Data and conclusions were drawn from the patient's questionnaire, the interview, and the physical examination. Because the information was provided in large part by the patient, it cannot be guaranteed that it has not been purposely or unconsciously manipulated. Every effort has been made to obtain as much relevant data as possible for this evaluation. It is important to note that the conclusions that lead to this procedure are derived in large part from the available data. Always take into account that the treatment will also be dependent on availability of resources and existing treatment guidelines, considered by other Pain Management Practitioners as being common knowledge and practice, at the time of the intervention. For Medico-Legal purposes, it is also important to point out that variation in procedural techniques and pharmacological choices are the acceptable norm. The indications, contraindications, technique, and results of the above procedure should only be interpreted and judged by a Board-Certified Interventional Pain Specialist with extensive familiarity and expertise in the same exact procedure and technique.

## 2021-03-11 NOTE — Progress Notes (Signed)
Safety precautions to be maintained throughout the outpatient stay will include: orient to surroundings, keep bed in low position, maintain call bell within reach at all times, provide assistance with transfer out of bed and ambulation.  

## 2021-03-11 NOTE — Patient Instructions (Addendum)
____________________________________________________________________________________________ ° °Post-Procedure Discharge Instructions ° °Instructions: °Apply ice:  °Purpose: This will minimize any swelling and discomfort after procedure.  °When: Day of procedure, as soon as you get home. °How: Fill a plastic sandwich bag with crushed ice. Cover it with a small towel and apply to injection site. °How long: (15 min on, 15 min off) Apply for 15 minutes then remove x 15 minutes.  Repeat sequence on day of procedure, until you go to bed. °Apply heat:  °Purpose: To treat any soreness and discomfort from the procedure. °When: Starting the next day after the procedure. °How: Apply heat to procedure site starting the day following the procedure. °How long: May continue to repeat daily, until discomfort goes away. °Food intake: Start with clear liquids (like water) and advance to regular food, as tolerated.  °Physical activities: Keep activities to a minimum for the first 8 hours after the procedure. After that, then as tolerated. °Driving: If you have received any sedation, be responsible and do not drive. You are not allowed to drive for 24 hours after having sedation. °Blood thinner: (Applies only to those taking blood thinners) You may restart your blood thinner 6 hours after your procedure. °Insulin: (Applies only to Diabetic patients taking insulin) As soon as you can eat, you may resume your normal dosing schedule. °Infection prevention: Keep procedure site clean and dry. Shower daily and clean area with soap and water. °Post-procedure Pain Diary: Extremely important that this be done correctly and accurately. Recorded information will be used to determine the next step in treatment. For the purpose of accuracy, follow these rules: °Evaluate only the area treated. Do not report or include pain from an untreated area. For the purpose of this evaluation, ignore all other areas of pain, except for the treated area. °After  your procedure, avoid taking a long nap and attempting to complete the pain diary after you wake up. Instead, set your alarm clock to go off every hour, on the hour, for the initial 8 hours after the procedure. Document the duration of the numbing medicine, and the relief you are getting from it. °Do not go to sleep and attempt to complete it later. It will not be accurate. If you received sedation, it is likely that you were given a medication that may cause amnesia. Because of this, completing the diary at a later time may cause the information to be inaccurate. This information is needed to plan your care. °Follow-up appointment: Keep your post-procedure follow-up evaluation appointment after the procedure (usually 2 weeks for most procedures, 6 weeks for radiofrequencies). DO NOT FORGET to bring you pain diary with you.  ° °Expect: (What should I expect to see with my procedure?) °From numbing medicine (AKA: Local Anesthetics): Numbness or decrease in pain. You may also experience some weakness, which if present, could last for the duration of the local anesthetic. °Onset: Full effect within 15 minutes of injected. °Duration: It will depend on the type of local anesthetic used. On the average, 1 to 8 hours.  °From steroids (Applies only if steroids were used): Decrease in swelling or inflammation. Once inflammation is improved, relief of the pain will follow. °Onset of benefits: Depends on the amount of swelling present. The more swelling, the longer it will take for the benefits to be seen. In some cases, up to 10 days. °Duration: Steroids will stay in the system x 2 weeks. Duration of benefits will depend on multiple posibilities including persistent irritating factors. °Side-effects: If present, they   may typically last 2 weeks (the duration of the steroids). °Frequent: Cramps (if they occur, drink Gatorade and take over-the-counter Magnesium 450-500 mg once to twice a day); water retention with temporary  weight gain; increases in blood sugar; decreased immune system response; increased appetite. °Occasional: Facial flushing (red, warm cheeks); mood swings; menstrual changes. °Uncommon: Long-term decrease or suppression of natural hormones; bone thinning. (These are more common with higher doses or more frequent use. This is why we prefer that our patients avoid having any injection therapies in other practices.)  °Very Rare: Severe mood changes; psychosis; aseptic necrosis. °From procedure: Some discomfort is to be expected once the numbing medicine wears off. This should be minimal if ice and heat are applied as instructed. ° °Call if: (When should I call?) °You experience numbness and weakness that gets worse with time, as opposed to wearing off. °New onset bowel or bladder incontinence. (Applies only to procedures done in the spine) ° °Emergency Numbers: °Durning business hours (Monday - Thursday, 8:00 AM - 4:00 PM) (Friday, 9:00 AM - 12:00 Noon): [phone] °After hours: [phone] °NOTE: If you are having a problem and are unable connect with, or to talk to a provider, then go to your nearest urgent care or emergency department. If the problem is serious and urgent, please call 911. °____________________________________________________________________________________________ ° Pain Management Discharge Instructions ° °General Discharge Instructions : ° °If you need to reach your doctor call: Monday-Friday 8:00 am - 4:00 pm at [phone] or toll free [phone].  After clinic hours [phone] to have operator reach doctor. ° °Bring all of your medication bottles to all your appointments in the pain clinic. ° °To cancel or reschedule your appointment with Pain Management please remember to call 24 hours in advance to avoid a fee. ° °Refer to the educational materials which you have been given on: General Risks, I had my Procedure. Discharge Instructions, Post Sedation. ° °Post Procedure  Instructions: ° °The drugs you were given will stay in your system until tomorrow, so for the next 24 hours you should not drive, make any legal decisions or drink any alcoholic beverages. ° °You may eat anything you prefer, but it is better to start with liquids then soups and crackers, and gradually work up to solid foods. ° °Please notify your doctor immediately if you have any unusual bleeding, trouble breathing or pain that is not related to your normal pain. ° °Depending on the type of procedure that was done, some parts of your body may feel week and/or numb.  This usually clears up by tonight or the next day. ° °Walk with the use of an assistive device or accompanied by an adult for the 24 hours. ° °You may use ice on the affected area for the first 24 hours.  Put ice in a Ziploc bag and cover with a towel and place against area 15 minutes on 15 minutes off.  You may switch to heat after 24 hours.Facet Blocks °Patient Information ° °Description: The facets are joints in the spine between the vertebrae.  Like any joints in the body, facets can become irritated and painful.  Arthritis can also effect the facets.  By injecting steroids and local anesthetic in and around these joints, we can temporarily block the nerve supply to them.  Steroids act directly on irritated nerves and tissues to reduce selling and inflammation which often leads to decreased pain.  Facet blocks may be done anywhere along the spine from the neck to the   low back depending upon the location of your pain. °  After numbing the skin with local anesthetic (like Novocaine), a small needle is passed onto the facet joints under x-ray guidance.  You may experience a sensation of pressure while this is being done.  The entire block usually lasts about 15-25 minutes.  ° °Conditions which may be treated by facet blocks: ° °Low back/buttock pain °Neck/shoulder pain °Certain types of headaches ° °Preparation for the injection: ° °Do not eat any  solid food or dairy products within 8 hours of your appointment. °You may drink clear liquid up to 3 hours before appointment.  Clear liquids include water, black coffee, juice or soda.  No milk or cream please. °You may take your regular medication, including pain medications, with a sip of water before your appointment.  Diabetics should hold regular insulin (if taken separately) and take 1/2 normal NPH dose the morning of the procedure.  Carry some sugar containing items with you to your appointment. °A driver must accompany you and be prepared to drive you home after your procedure. °Bring all your current medications with you. °An IV may be inserted and sedation may be given at the discretion of the physician. °A blood pressure cuff, EKG and other monitors will often be applied during the procedure.  Some patients may need to have extra oxygen administered for a short period. °You will be asked to provide medical information, including your allergies and medications, prior to the procedure.  We must know immediately if you are taking blood thinners (like Coumadin/Warfarin) or if you are allergic to IV iodine contrast (dye).  We must know if you could possible be pregnant. ° °Possible side-effects: ° °Bleeding from needle site °Infection (rare, may require surgery) °Nerve injury (rare) °Numbness & tingling (temporary) °Difficulty urinating (rare, temporary) °Spinal headache (a headache worse with upright posture) °Light-headedness (temporary) °Pain at injection site (serveral days) °Decreased blood pressure (rare, temporary) °Weakness in arm/leg (temporary) °Pressure sensation in back/neck (temporary) ° ° °Call if you experience: ° °Fever/chills associated with headache or increased back/neck pain °Headache worsened by an upright position °New onset, weakness or numbness of an extremity below the injection site °Hives or difficulty breathing (go to the emergency room) °Inflammation or drainage at the injection  site(s) °Severe back/neck pain greater than usual °New symptoms which are concerning to you ° °Please note: ° °Although the local anesthetic injected can often make your back or neck feel good for several hours after the injection, the pain will likely return. It takes 3-7 days for steroids to work.  You may not notice any pain relief for at least one week. ° °If effective, we will often do a series of 2-3 injections spaced 3-6 weeks apart to maximally decrease your pain.  After the initial series, you may be a candidate for a more permanent nerve block of the facets. ° °If you have any questions, please call [phone] ° [facility] °

## 2021-03-12 ENCOUNTER — Telehealth: Payer: Self-pay | Admitting: *Deleted

## 2021-03-12 NOTE — Telephone Encounter (Signed)
No problems post procedure. 

## 2021-03-19 ENCOUNTER — Other Ambulatory Visit: Payer: Self-pay | Admitting: Family Medicine

## 2021-03-19 DIAGNOSIS — F4323 Adjustment disorder with mixed anxiety and depressed mood: Secondary | ICD-10-CM

## 2021-03-24 ENCOUNTER — Telehealth: Payer: Self-pay

## 2021-03-24 ENCOUNTER — Encounter: Payer: Self-pay | Admitting: Pain Medicine

## 2021-03-24 NOTE — Telephone Encounter (Signed)
LM for patient to call back for oprwe virtual appointment questions.

## 2021-03-25 ENCOUNTER — Other Ambulatory Visit: Payer: Self-pay

## 2021-03-25 ENCOUNTER — Ambulatory Visit: Payer: Medicare Other | Attending: Pain Medicine | Admitting: Pain Medicine

## 2021-03-25 DIAGNOSIS — R937 Abnormal findings on diagnostic imaging of other parts of musculoskeletal system: Secondary | ICD-10-CM

## 2021-03-25 DIAGNOSIS — M542 Cervicalgia: Secondary | ICD-10-CM

## 2021-03-25 DIAGNOSIS — M5412 Radiculopathy, cervical region: Secondary | ICD-10-CM | POA: Diagnosis not present

## 2021-03-25 DIAGNOSIS — S22000S Wedge compression fracture of unspecified thoracic vertebra, sequela: Secondary | ICD-10-CM

## 2021-03-25 DIAGNOSIS — S22078S Other fracture of T9-T10 vertebra, sequela: Secondary | ICD-10-CM

## 2021-03-25 DIAGNOSIS — M47812 Spondylosis without myelopathy or radiculopathy, cervical region: Secondary | ICD-10-CM

## 2021-03-25 DIAGNOSIS — M4802 Spinal stenosis, cervical region: Secondary | ICD-10-CM | POA: Diagnosis not present

## 2021-03-25 DIAGNOSIS — G8929 Other chronic pain: Secondary | ICD-10-CM

## 2021-03-25 DIAGNOSIS — M4310 Spondylolisthesis, site unspecified: Secondary | ICD-10-CM

## 2021-03-25 NOTE — Progress Notes (Signed)
Patient: [name].  Service Category: E/M  Provider: Gaspar Cola, MD  DOB: [birth year]  DOS: 03/25/2021  Location: Office  MRN: [id number]  Setting: Ambulatory outpatient  Referring Provider: [name], MD  Type: Established Patient  Specialty: Interventional Pain Management  PCP: [name], MD  Location: Remote location  Delivery: TeleHealth     Virtual Encounter - Pain Management PROVIDER NOTE: Information contained herein reflects review and annotations entered in association with encounter. Interpretation of such information and data should be left to medically-trained personnel. Information provided to patient can be located elsewhere in the medical record under "Patient Instructions". Document created using STT-dictation technology, any transcriptional errors that may result from process are unintentional.    Contact & Pharmacy Preferred: [phone] Home: [phone] (home) Mobile: [phone] (mobile) E-mail: [email or web address]  [facility] ([place], [place] [address] [place] [address] Phone: [phone] Fax: [phone]  [id number] [address]Phone: [phone]: [phone] HARRIS [id number]- [place]ina Rabon [place]- [address]Phone: [phone]: [phone] [facility] ) - [place] [address] [address]Phone: [phone]: [phone]  Pre-screening  Mr. BHavardoffered "in-person" vs "virtual" encounter. He indicated preferring virtual for this encounter.   Reason COVID-19*   Social distancing based on CDC and AMA recommendations.   I contacted JDebroah Ayala on 03/25/2021 via telephone.      I clearly identified myself as FGaspar Cola MD. I verified that I was speaking with the correct person using two identifiers (Name: [name], and date of birth: 3May 22, 1957.  Consent I sought verbal advanced consent from JDebroah Ayala for virtual visit interactions. I informed Mr. [name] possible security and privacy concerns, risks, and limitations associated with providing "not-in-person" medical evaluation and management services. I also informed Mr. [name] the availability of "in-person" appointments. Finally, I informed him that there would be a charge for the virtual visit and that he could be  personally, fully or partially, financially responsible for it. Mr. [name] understanding and agreed to proceed.   Historic Elements   Mr. JMyshawn Ayala is a 66y.o. year old, male patient evaluated today after our last contact on 03/11/2021. Mr. 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 He also  has a past surgical history that includes Ganglion cyst excision (Right, 1994); Nasal septum surgery (2011); Cardiac catheterization (N/A, 01/04/2016); Cardiac catheterization (N/A, 02/17/2016); Atrial flutter ablation (02/17/2016); Colonoscopy (2014); Hernia repair (Right, 1994); and Colonoscopy with propofol (N/A, 06/30/2016). Mr. BPascuahas a current medication list which includes the following prescription(s): albuterol, alprazolam, atorvastatin, cyclobenzaprine, escitalopram, finasteride, advair hfa,  loratadine, losartan, meloxicam, pantoprazole, tadalafil, and trulance. He  reports that he quit smoking about 30 years ago. His smoking use included cigarettes. He has a 20.00 pack-year  smoking history. He has never used smokeless tobacco. He reports that he does not drink alcohol and does not use drugs. [name] is allergic to other.   HPI  Today, he is being contacted for a post-procedure assessment.  Today I had a long conversation with this patient trying to get information from the second diagnostic bilateral cervical facet block done on 03/11/2021.  He tends to be very talkative and interrupts quite often when we are trying to get information.  Not only that, but he tends to go on tangential subjects rather than directly answering the questions.  Today I spent a lot of time doing this because the nurses initial evaluation suggested that he had attained 100% relief of the pain for the duration of the local anesthetic when in fact he tells me that immediately after the procedure his pain actually worsened.  He does admit that overall it is somewhat better at this point.  It again took me quite a bit of time to get him to provide me with a percentage improvement and he refers having attained an ongoing 20% improvement of his neck pain.  However, he refers that anything that he tries to do where it ends up pulling on his arm (dog pulling the leash) or when he tries to pick something up such as a gallon of milk (8.6 lbs), he gets neck pain.  Furthermore, he also indicates continuing to have left upper extremity numbness which he refers he has had for years.  He denies having had any kind of cervical spine surgery and because he recently had the cervical MRI, he decided to cancel the nerve conduction test of the upper extremity.  Today I have explained to him that the to provide very different information and he needs to contact the [facility] again and get himself rescheduled for that upper extremity  EMG/PNCV however, there is an EMG of the left upper extremity done on 12/06/2017 that was read as "normal study".   According to the patient, he attained 0% relief of the pain for the duration of the local anesthetic and in fact is pain worsened until the next day when it went down to a 20% improvement that seems to be ongoing.  The results of his 2 diagnostic cervical facet blocks plus the 1 done by Dr. Sharlet Salina, DO, which suggest that his pain in the cervical region is not coming from the cervical facets.  Furthermore based on the diagnostic cervical epidural steroid injection that I had done, where he indicated absolutely no benefit, would again suggest these interventional therapies not to be the answer to his problem.  At this point I will be sending the patient to a neurosurgeon with the results of his cervical MRI for an evaluation and opinion to determine if there are any other particular causes of this pain that we have not looked into.  Based on my evaluation and the results from my diagnostic interventions, I do not believe that he would benefit from any further injection therapy.  Post-procedure evaluation     Procedure:          Anesthesia, Analgesia, Anxiolysis:  Type: Cervical Facet Medial Branch Block(s)  #2  Primary Purpose: Diagnostic Region: Posterolateral cervical spine Level: C3, C4, C5, C6, & C7 Medial Branch Level(s). Injecting these levels blocks the C3-4, C4-5, C5-6, and C6-7 cervical facet joints. Laterality: Bilateral  Anesthesia: Local (1-2% Lidocaine)  Anxiolysis: None  Sedation: None  Guidance: Fluoroscopy  Position: Prone with head of the table raised to facilitate breathing.   1. Cervical facet syndrome (Bilateral)   2. Spondylosis without myelopathy or radiculopathy, cervical region   3. Cervical facet hypertrophy   4. Cervical retrolisthesis of C4 over C5   5. Cervicalgia   6. Cervicogenic headache (3ry area of Pain) (Bilateral)   7.  Chronic neck pain (1ry area of Pain) (Bilateral) (L>R)   8. DDD (degenerative disc disease), cervical    NAS-11 Pain score:   Pre-procedure: 5 /10   Post-procedure: 5 /10     Effectiveness:  Initial hour after procedure: 0%. Subsequent 4-6 hours post-procedure: 0%. Analgesia past initial 6 hours: 20 %. Ongoing improvement:  Analgesic: The patient indicates having an ongoing 20% improvement in his neck pain. Function: No improvement.  He refers still having exactly the same problems as before. ROM: No improvement.  Pharmacotherapy Assessment   Opioid Analgesic: .   none MME/day: 0 mg/day   Monitoring: [place] PMP: PDMP reviewed during this encounter.       Pharmacotherapy: No side-effects or adverse reactions reported. Compliance: No problems identified. Effectiveness: Clinically acceptable. Plan: Refer to "POC". UDS: No results found for: SUMMARY   Laboratory Chemistry Profile   Renal Lab Results  Component Value Date   BUN 15 08/31/2020   CREATININE 0.87 08/31/2020   BCR 13 02/15/2016   GFR 91.30 08/31/2020   GFRAA >60 02/22/2017   GFRNONAA >60 08/20/2020    Hepatic Lab Results  Component Value Date   AST 18 08/31/2020   ALT 21 08/31/2020   ALBUMIN 4.0 08/31/2020   ALKPHOS 66 08/31/2020   HCVAB NEGATIVE 09/04/2015    Electrolytes Lab Results  Component Value Date   NA 140 08/31/2020   K 4.4 08/31/2020   CL 102 08/31/2020   CALCIUM 9.6 08/31/2020   MG 2.0 11/30/2015    Bone Lab Results  Component Value Date   TESTOSTERONE 485 11/22/2016    Inflammation (CRP: Acute Phase) (ESR: Chronic Phase) Lab Results  Component Value Date   CRP 1.2 (H) 01/04/2021   ESRSEDRATE 8 01/04/2021         Note: Above Lab results reviewed.  Imaging  DG PAIN CLINIC C-ARM 1-60 MIN NO REPORT Fluoro was used, but no Radiologist interpretation will be provided.  Please refer to "NOTES" tab for provider progress note.  Assessment  The primary encounter diagnosis was  Cervicalgia. 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  Plan of Care  Problem-specific:  No problem-specific Assessment & Plan notes found for this encounter.  Mr. Lamond Glantz. has a current medication list which includes the following long-term medication(s): albuterol, atorvastatin, escitalopram, advair hfa, loratadine, losartan, pantoprazole, and tadalafil.  Pharmacotherapy (Medications Ordered): No orders of the defined types were placed in this encounter.  Orders:  Orders Placed This Encounter  Procedures   Ambulatory referral to Neurosurgery    Referral Priority:   Routine    Referral Type:   Surgical    Referral Reason:   Specialty Services Required    Referred to Provider:   [place]    Requested Specialty:   Neurosurgery    Number of Visits Requested:  1   Follow-up plan:   Return if symptoms worsen or fail to improve.     Interventional Therapies  Risk   Complexity Considerations:   Estimated body mass index is 27.98 kg/m as calculated from the following:   Height as of this encounter: 5' 10"  (1.778 m).   Weight as of this encounter: 195 lb (88.5 kg). Anxiety    Planned   Pending:      Under consideration:   No further interventional therapies at this time.  (Read 03/25/2021 note)   Completed:   Diagnostic bilateral cervical facet MBB #1 (01/19/2021) (50/50/0/0)  Diagnostic bilateral cervical facet MBB #2 (03/11/2021) (0/0/20/20)  Diagnostic left cervical ESI x1 (02/09/2021) (0/0/0/0)    Completed by other providers:   By Sharlet Salina, DO: 09/17/2019: Left S1  transforaminal ESI 03/15/2019: Left C5-6 and left C6-7 facet joint injections (3 days of increased pain and then continued left-sided neck pain) 09/06/2018: Left S1 transforaminal ESI (moderate relief) 07/27/2018: Left S1 transforaminal ESI (moderate to good relief) 07/06/2018: Left L5-S1 transforaminal ESI (minimal relief) 03/14/2018: Left L4-5 and L5-S1 facet joint injections (2 weeks of increased pain and then mild relief of left-sided low back pain)  By Gurney Maxin, MD: (12/05/2017) left lower extremity EMG/PNCV: Abnormal study with electrodiagnostic evidence of 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 (12/06/2017) left upper extremity EMG/PNCV: Normal study.   Therapeutic   Palliative (PRN) options:   None established    Recent Visits Date Type Provider Dept  03/11/21 Procedure visit Milinda Pointer, MD [facility]  02/25/21 Office Visit Milinda Pointer, MD [facility]  02/09/21 Procedure visit Milinda Pointer, MD [facility]  02/04/21 Office Visit Milinda Pointer, MD [facility]  01/19/21 Procedure visit Milinda Pointer, MD [facility]  01/04/21 Office Visit Milinda Pointer, MD [facility]  Showing recent visits within past 90 days and meeting all other requirements Today's Visits Date Type Provider Dept  03/25/21 Office Visit Milinda Pointer, MD [facility]  Showing today's visits and meeting all other requirements Future Appointments No visits were found meeting these conditions. Showing future appointments within next 90 days and meeting all other requirements  I discussed the assessment and treatment plan with the patient. The patient was provided an opportunity to ask questions and all were answered. The patient agreed with the plan and demonstrated an understanding of the instructions.  Patient advised to call back or seek an in-person evaluation if the symptoms or  condition worsens.  Duration of encounter: 22 minutes.  Note by: Gaspar Cola, MD Date: 03/25/2021; Time: 3:59 PM

## 2021-03-29 ENCOUNTER — Telehealth: Payer: Self-pay | Admitting: Family Medicine

## 2021-03-29 DIAGNOSIS — I10 Essential (primary) hypertension: Secondary | ICD-10-CM

## 2021-03-29 NOTE — Telephone Encounter (Signed)
Patient calling to check if he is due for an appointment and labs.   Scheduled Patient an appointment for 05/05/21 with Dr Caryl Bis. Patient wanting to know if he needs lab work done before his visit.   Please advise

## 2021-03-29 NOTE — Telephone Encounter (Signed)
BMET ordered. This appears to be all he is due for now. This can be done 2 days prior to his visit.

## 2021-03-30 NOTE — Telephone Encounter (Signed)
I called and spoke with the patient and he is scheduled for labs before he is being seen on 05/05/2021.  [name],cma

## 2021-04-12 NOTE — Progress Notes (Signed)
9:04 AM   [name]. 1955-11-27 [id number]  Referring provider: [name], MD [address] [place],  [place] [address]  Chief Complaint  Patient presents with   Benign Prostatic Hypertrophy   Erectile Dysfunction   Urological history: 1. BPH with LU TS -PSA 1.1 (2.2) 11/2020 -I PSS 19/2 -PVR 101 mL  -managed with tadalafil 5 mg daily and finasteride 5 mg daily  2. Nocturia -Risk factors for nocturia: obstructive sleep apnea (untreated), hypertension, arthritis, COPD, depression and BPH -Myrbetriq cost prohibitive  3. Erectile dysfunction -contributing factors of age, BPH, HTN, HLD, sleep apnea and depression -not sexually active  4. Epididymal cyst -scrotal ultrasound 2015 3 mm left epididymal cyst   5. Renal cyst -contrast CT 2018 -  3.2 cm right mid pole renal cyst  HPI: Tyler Ayalais a 66 y.o. male who presents today for medication refill.  He complains of frequency, weak urinary stream and intermittency at night.  Patient denies any modifying or aggravating factors.  Patient denies any gross hematuria, dysuria or suprapubic/flank pain.  Patient denies any fevers, chills, nausea or vomiting.     He has been out of the tadalafil for 5 mg daily for one month.  He noted that the intermittency worsened at night.   IPSS     Row Name 04/13/21 0800         International Prostate Symptom Score   How often have you had the sensation of not emptying your bladder? Less than half the time     How often have you had to urinate less than every two hours? About half the time     How often have you found you stopped and started again several times when you urinated? About half the time     How often have you found it difficult to postpone urination? About half the time     How often have you had a weak urinary stream? About half the time     How often have you had to strain to start urination? Less than half the time     How many times did  you typically get up at night to urinate? 3 Times     Total IPSS Score 19       Quality of Life due to urinary symptoms   If you were to spend the rest of your life with your urinary condition just the way it is now how would you feel about that? Mostly Satisfied               Score:  1-7 Mild 8-19 Moderate 20-35 Severe    PMH: Past Medical History:  Diagnosis Date   Anxiety    Arthritis    BPH (benign prostatic hyperplasia) 11/19/2013   Bulge of lumbar disc without myelopathy    L2/3 through L5/6   Bulging disc    C2/3, C3/4, C6/7   Cervical disc herniation    C4/5 and C5/6   Colon polyp 04/18/2011   Constipation 08/31/2012   Depression    Diverticulosis    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/2012   GERD (gastroesophageal reflux disease)    H/O diverticulitis of colon    Hemorrhoid    Hiatal hernia    Hyperlipidemia    Levoscoliosis    Sleep difficulties    Spinal stenosis in cervical region    cord abutment C4/5   Testicular pain, left    Typical  atrial flutter ([place]) 11/2015   a. CHADS2VASc => 0; b. on Eliquis for pending ablation     Surgical History: Past Surgical History:  Procedure Laterality Date   ATRIAL FLUTTER ABLATION  02/17/2016   COLONOSCOPY  2014   COLONOSCOPY WITH PROPOFOL N/A 06/30/2016   Procedure: COLONOSCOPY WITH PROPOFOL;  Surgeon: Lucilla Lame, MD;  Location: [facility];  Service: Endoscopy;  Laterality: N/A;   ELECTROPHYSIOLOGIC STUDY N/A 01/04/2016   Procedure: Cardioversion;  Surgeon: Wellington Hampshire, MD;  Location: [facility];  Service: Cardiovascular;  Laterality: N/A;   ELECTROPHYSIOLOGIC STUDY N/A 02/17/2016   Procedure: A-Flutter Ablation;  Surgeon: Deboraha Sprang, MD;  Location: [facility];  Service: Cardiovascular;  Laterality: N/A;   GANGLION CYST EXCISION Right 1994   wrist & back   HERNIA REPAIR Right 1994   abdominal repair with mesh   NASAL SEPTUM SURGERY  2011   Dr. [name]     Home  Medications:  Allergies as of 04/13/2021       Reactions   Other Other (See Comments)   Pollen:  Sinus infection, cough, fatigue Other reaction(s): Other (See Comments), Other (See Comments) Pollen:  Sinus infection, cough, fatigue Pollen:  Sinus infection, cough, fatigue        Medication List        Accurate as of April 13, 2021  9:04 AM. If you have any questions, ask your nurse or doctor.          Advair HFA 115-21 MCG/ACT inhaler Generic drug: fluticasone-salmeterol Inhale 2 puffs into the lungs 2 (two) times daily.   albuterol 108 (90 Base) MCG/ACT inhaler Commonly known as: VENTOLIN HFA TAKE 2 PUFFS BY MOUTH EVERY 6 HOURS AS NEEDED FOR WHEEZE OR SHORTNESS OF BREATH   ALPRAZolam 0.5 MG tablet Commonly known as: XANAX TAKE 1 TABLET BY MOUTH EVERY NIGHT AT BEDTIME AS NEEDED FOR ANXIETY   atorvastatin 20 MG tablet Commonly known as: LIPITOR TAKE 1 TABLET BY MOUTH IN  THE MORNING   cyclobenzaprine 10 MG tablet Commonly known as: FLEXERIL TAKE 1 TABLET BY MOUTH 3  TIMES DAILY AS NEEDED FOR  MUSCLE SPASM(S)   escitalopram 10 MG tablet Commonly known as: LEXAPRO TAKE 1 TABLET BY MOUTH  DAILY   finasteride 5 MG tablet Commonly known as: PROSCAR TAKE 1 TABLET BY MOUTH  DAILY   loratadine 10 MG tablet Commonly known as: CLARITIN Take 10 mg by mouth daily as needed for allergies.   losartan 50 MG tablet Commonly known as: COZAAR TAKE 1 TABLET BY MOUTH  DAILY   meloxicam 15 MG tablet Commonly known as: MOBIC TAKE 1 TABLET BY MOUTH  DAILY AS NEEDED FOR PAIN   pantoprazole 40 MG tablet Commonly known as: PROTONIX TAKE 1 TABLET BY MOUTH  DAILY   tadalafil 5 MG tablet Commonly known as: CIALIS TAKE 1 TABLET BY MOUTH  DAILY   Trulance 3 MG Tabs Generic drug: Plecanatide TAKE 1 TABLET BY MOUTH  DAILY        Allergies:  Allergies  Allergen Reactions   Other Other (See Comments)    Pollen:  Sinus infection, cough, fatigue  Other reaction(s): Other  (See Comments), Other (See Comments) Pollen:  Sinus infection, cough, fatigue Pollen:  Sinus infection, cough, fatigue    Family History: Family History  Problem Relation Age of Onset   Hyperlipidemia Mother    Cancer Mother        skin cancer?   Heart disease Mother 22  MI - died in her sleep   Hyperlipidemia Father    Heart disease Father    Kidney disease Neg Hx    Prostate cancer Neg Hx    Kidney cancer Neg Hx    Bladder Cancer Neg Hx     Social History:  reports that he quit smoking about 30 years ago. His smoking use included cigarettes. He has a 20.00 pack-year smoking history. He has never used smokeless tobacco. He reports that he does not drink alcohol and does not use drugs.  ROS: For pertinent review of systems please refer to history of present illness  Physical Exam: BP 110/71    Pulse 65    Ht '5\' 10"'$  (1.778 m)    Wt 195 lb (88.5 kg)    BMI 27.98 kg/m   Constitutional:  Well nourished. Alert and oriented, No acute distress. HEENT: [place] AT, mask in place.  Trachea midline Cardiovascular: No clubbing, cyanosis, or edema. Respiratory: Normal respiratory effort, no increased work of breathing. GU: No CVA tenderness.  No bladder fullness or masses.  Patient with circumcised/uncircumcised phallus.   Urethral meatus is patent.  No penile discharge. No penile lesions or rashes. Scrotum without lesions, cysts, rashes and/or edema.  Testicles are located scrotally bilaterally. No masses are appreciated in the testicles. Left and right epididymis are normal.  3 mm left epididymal cyst remains.  Rectal: Patient with  normal sphincter tone. Anus and perineum without scarring or rashes. No rectal masses are appreciated. Prostate is approximately 45 grams, no nodules are appreciated. Seminal vesicles could not be palpated  Neurologic: Grossly intact, no focal deficits, moving all 4 extremities. Psychiatric: Normal mood and affect.   Laboratory Data: Component Prostate  Specific Ag, Serum  Latest Ref Rng & Units 0.0 - 4.0 ng/mL  11/16/2016 1.9  11/20/2017 1.6  11/21/2018 1.8  11/19/2019 1.4  11/23/2020 1.1   Lab Results  Component Value Date   WBC 6.4 08/20/2020   HGB 13.6 08/20/2020   HCT 39.1 08/20/2020   MCV 86.3 08/20/2020   PLT 169 08/20/2020    Lab Results  Component Value Date   CREATININE 0.87 08/31/2020       Component Value Date/Time   CHOL 151 07/29/2020 1432   HDL 47.90 07/29/2020 1432   CHOLHDL 3 07/29/2020 1432   VLDL 16.2 07/29/2020 1432   LDLCALC 87 07/29/2020 1432  I have reviewed the labs.  Pertinent Imaging  04/13/21 08:39  Scan Result 121m     Assessment & Plan:    1. BPH with LUTS -PSA stable -DRE benign -PVR < 300 cc -most bothersome symptoms are intermittency at night -continue conservative management, avoiding bladder irritants and timed voiding's -Restart Cialis 5 mg daily -[facility] -continue finasteride 5 mg daily    2. Nocturia  -Myrbetriq cost prohibitive  -cannot tolerate CPAP machine  3. ED -not sexually active  4. Epididymal cysts -Unchanged on physical exam -Continue to monitor  Return in about 1 year (around 04/14/2022) for IPSS, PVR and exam.  These notes generated with voice recognition software. I apologize for typographical errors.  SZara Council [name]  [facility] [address] [phone]

## 2021-04-13 ENCOUNTER — Ambulatory Visit (INDEPENDENT_AMBULATORY_CARE_PROVIDER_SITE_OTHER): Payer: Medicare Other | Admitting: Urology

## 2021-04-13 ENCOUNTER — Encounter: Payer: Self-pay | Admitting: Urology

## 2021-04-13 ENCOUNTER — Other Ambulatory Visit: Payer: Self-pay

## 2021-04-13 VITALS — BP 110/71 | HR 65 | Ht 70.0 in | Wt 195.0 lb

## 2021-04-13 DIAGNOSIS — N529 Male erectile dysfunction, unspecified: Secondary | ICD-10-CM

## 2021-04-13 DIAGNOSIS — N401 Enlarged prostate with lower urinary tract symptoms: Secondary | ICD-10-CM

## 2021-04-13 DIAGNOSIS — N503 Cyst of epididymis: Secondary | ICD-10-CM

## 2021-04-13 DIAGNOSIS — N138 Other obstructive and reflux uropathy: Secondary | ICD-10-CM | POA: Diagnosis not present

## 2021-04-13 DIAGNOSIS — R351 Nocturia: Secondary | ICD-10-CM

## 2021-04-13 LAB — BLADDER SCAN AMB NON-IMAGING

## 2021-04-13 MED ORDER — TADALAFIL 5 MG PO TABS: ORAL_TABLET | ORAL | 2 refills | Status: DC

## 2021-04-15 DIAGNOSIS — S22070A Wedge compression fracture of T9-T10 vertebra, initial encounter for closed fracture: Secondary | ICD-10-CM | POA: Diagnosis not present

## 2021-04-15 DIAGNOSIS — M5030 Other cervical disc degeneration, unspecified cervical region: Secondary | ICD-10-CM | POA: Diagnosis not present

## 2021-04-15 DIAGNOSIS — M47812 Spondylosis without myelopathy or radiculopathy, cervical region: Secondary | ICD-10-CM | POA: Diagnosis not present

## 2021-04-21 ENCOUNTER — Ambulatory Visit: Payer: Medicare Other

## 2021-04-25 ENCOUNTER — Other Ambulatory Visit: Payer: Self-pay | Admitting: Family Medicine

## 2021-04-25 DIAGNOSIS — K219 Gastro-esophageal reflux disease without esophagitis: Secondary | ICD-10-CM

## 2021-04-25 DIAGNOSIS — G8929 Other chronic pain: Secondary | ICD-10-CM

## 2021-04-30 ENCOUNTER — Other Ambulatory Visit (INDEPENDENT_AMBULATORY_CARE_PROVIDER_SITE_OTHER): Payer: Medicare Other

## 2021-04-30 ENCOUNTER — Other Ambulatory Visit: Payer: Self-pay

## 2021-04-30 DIAGNOSIS — I10 Essential (primary) hypertension: Secondary | ICD-10-CM

## 2021-04-30 LAB — BASIC METABOLIC PANEL
BUN: 20 mg/dL (ref 6–23)
CO2: 32 mEq/L (ref 19–32)
Calcium: 9.5 mg/dL (ref 8.4–10.5)
Chloride: 105 mEq/L (ref 96–112)
Creatinine, Ser: 0.94 mg/dL (ref 0.40–1.50)
GFR: 85.37 mL/min (ref >60.00)
Glucose, Bld: 94 mg/dL (ref 70–99)
Potassium: 4.7 mEq/L (ref 3.5–5.1)
Sodium: 142 mEq/L (ref 135–145)

## 2021-05-05 ENCOUNTER — Other Ambulatory Visit: Payer: Self-pay

## 2021-05-05 ENCOUNTER — Ambulatory Visit (INDEPENDENT_AMBULATORY_CARE_PROVIDER_SITE_OTHER): Payer: Medicare Other | Admitting: Family Medicine

## 2021-05-05 ENCOUNTER — Encounter: Payer: Self-pay | Admitting: Family Medicine

## 2021-05-05 ENCOUNTER — Telehealth: Payer: Self-pay | Admitting: Family Medicine

## 2021-05-05 VITALS — BP 120/80 | HR 73 | Temp 97.9°F | Ht 70.0 in | Wt 203.0 lb

## 2021-05-05 DIAGNOSIS — R21 Rash and other nonspecific skin eruption: Secondary | ICD-10-CM

## 2021-05-05 DIAGNOSIS — F4323 Adjustment disorder with mixed anxiety and depressed mood: Secondary | ICD-10-CM | POA: Diagnosis not present

## 2021-05-05 DIAGNOSIS — I10 Essential (primary) hypertension: Secondary | ICD-10-CM | POA: Diagnosis not present

## 2021-05-05 DIAGNOSIS — M5442 Lumbago with sciatica, left side: Secondary | ICD-10-CM

## 2021-05-05 DIAGNOSIS — R051 Acute cough: Secondary | ICD-10-CM | POA: Diagnosis not present

## 2021-05-05 DIAGNOSIS — J988 Other specified respiratory disorders: Secondary | ICD-10-CM | POA: Diagnosis not present

## 2021-05-05 DIAGNOSIS — G8929 Other chronic pain: Secondary | ICD-10-CM

## 2021-05-05 LAB — POC COVID19 BINAXNOW: SARS Coronavirus 2 Ag: POSITIVE — AB

## 2021-05-05 MED ORDER — PREDNISONE 20 MG PO TABS: 40.0000 mg | ORAL_TABLET | Freq: Every day | ORAL | 0 refills | Status: DC

## 2021-05-05 MED ORDER — GABAPENTIN 100 MG PO CAPS: 100.0000 mg | ORAL_CAPSULE | Freq: Three times a day (TID) | ORAL | 3 refills | Status: DC

## 2021-05-05 MED ORDER — DOXYCYCLINE HYCLATE 100 MG PO TABS: 100.0000 mg | ORAL_TABLET | Freq: Two times a day (BID) | ORAL | 0 refills | Status: DC

## 2021-05-05 NOTE — Assessment & Plan Note (Signed)
Adequately controlled for his age.  He will continue losartan 50 mg once daily.  Recent BMP reviewed. ?

## 2021-05-05 NOTE — Telephone Encounter (Signed)
I spoke with the patient regarding his positive COVID test.  Discussed that given his current symptoms he should remain quarantined through 05/11/2021.  Advised he should wear a mask if he has to be around anybody in his household.  Discussed he could leave his house to seek medical attention.  He was advised to seek medical attention immediately for increasing shortness of breath, cough productive of blood, or elevated fevers of 103 ?F or higher.  Discussed COVID antiviral medications though he declines these.  He will contact us with any questions. ?

## 2021-05-05 NOTE — Assessment & Plan Note (Signed)
Chronic issue.  We will trial gabapentin 100 mg 3 times daily.  Discussed monitoring for excessive drowsiness with this.  He will discontinue his meloxicam as he wishes to get off of several pills.  We will see if the gabapentin is helpful for the back pain as well as his sciatica.  If he notices other side effects he will let us know. ?

## 2021-05-05 NOTE — Assessment & Plan Note (Signed)
Chronic issue.  Stable.  He will remain off of daily medication at this time.  He can continue Xanax as prescribed which he rarely uses. ?

## 2021-05-05 NOTE — Assessment & Plan Note (Signed)
Likely viral in nature.  We will test for COVID-19.  He will remain home until we have the results.  He reports a history of COPD and thus we will cover for COPD exacerbation as well with prednisone and doxycycline.  He was advised to contact us if he has excessive agitation or sleeping issues on the prednisone.  He was advised to seek medical attention for dyspnea, cough productive of blood, or fevers. ?

## 2021-05-05 NOTE — Addendum Note (Signed)
Addended by: Fulton Mole D on: 05/05/2021 10:16 AM ? ? Modules accepted: Orders ? ?

## 2021-05-05 NOTE — Assessment & Plan Note (Addendum)
Could possibly be a tinea infection versus an atopic rash.  Has been improving with Vaseline.  Discussed if it does not continue to improve he could try over-the-counter hydrocortisone and Lotrimin to see if that would resolve the issue.  If it does not resolve with these things he will let us know. ?

## 2021-05-05 NOTE — Progress Notes (Signed)
?Tyler Rumps, MD ?Phone: [phone] ? ?Tyler Cork Tiyon Sanor. is a 66 y.o. male who presents today for follow-up. ? ?Respiratory infection: Patient notes this started 4 days ago.  He started with sinus congestion and then it moved down to his chest.  He notes blowing yellow mucus out of his nose with some blood-tinged to it.  He is coughing up yellow phlegm.  No fevers.  Some shortness of breath.  Some postnasal drip.  No sore throat.  No taste or smell disturbances.  No known COVID or flu exposure. ? ?Anxiety and depression: Patient notes he has good and bad days.  He stopped the Lexapro as he wanted to get off of medications.  He rarely takes Xanax.  No SI. ? ?Hypertension: Typically less than 140/90.  Taking losartan.  No chest pain.  No change to chronic dyspnea.  No edema. ? ?Rash: Patient reports rough itchy rash that occurred on his bilateral arms and his right lower leg.  He had 4 spots that were about the size of a dime or nickel.  He has been applying Vaseline and notes they are improving. ? ?Chronic back pain: Patient reports chronic back pain with chronic left-sided sciatica.  Occasional numbness in his bilateral toes.  No weakness.  No incontinence.  He wonders about starting on gabapentin for this. ? ?Social History  ? ?Tobacco Use  ?Smoking Status Former  ? Packs/day: 1.00  ? Years: 20.00  ? Pack years: 20.00  ? Types: Cigarettes  ? Quit date: 02/08/1991  ? Years since quitting: 30.2  ?Smokeless Tobacco Never  ? ? ?Current Outpatient Medications on File Prior to Visit  ?Medication Sig Dispense Refill  ? ALPRAZolam (XANAX) 0.5 MG tablet TAKE 1 TABLET BY MOUTH EVERY NIGHT AT BEDTIME AS NEEDED FOR ANXIETY 20 tablet 0  ? atorvastatin (LIPITOR) 20 MG tablet TAKE 1 TABLET BY MOUTH IN  THE MORNING 90 tablet 3  ? cyclobenzaprine (FLEXERIL) 10 MG tablet TAKE 1 TABLET BY MOUTH 3  TIMES DAILY AS NEEDED FOR  MUSCLE SPASM(S) 90 tablet 0  ? finasteride (PROSCAR) 5 MG tablet TAKE 1 TABLET BY MOUTH  DAILY 90 tablet 3   ? fluticasone-salmeterol (ADVAIR HFA) 115-21 MCG/ACT inhaler Inhale 2 puffs into the lungs 2 (two) times daily. 1 each 12  ? loratadine (CLARITIN) 10 MG tablet Take 10 mg by mouth daily as needed for allergies.    ? losartan (COZAAR) 50 MG tablet TAKE 1 TABLET BY MOUTH  DAILY 90 tablet 3  ? meloxicam (MOBIC) 15 MG tablet TAKE 1 TABLET BY MOUTH  DAILY AS NEEDED FOR PAIN 90 tablet 3  ? pantoprazole (PROTONIX) 40 MG tablet TAKE 1 TABLET BY MOUTH  DAILY 90 tablet 3  ? tadalafil (CIALIS) 5 MG tablet TAKE 1 TABLET BY MOUTH  DAILY 90 tablet 2  ? TRULANCE 3 MG TABS TAKE 1 TABLET BY MOUTH  DAILY 90 tablet 3  ? albuterol (VENTOLIN HFA) 108 (90 Base) MCG/ACT inhaler TAKE 2 PUFFS BY MOUTH EVERY 6 HOURS AS NEEDED FOR WHEEZE OR SHORTNESS OF BREATH (Patient not taking: Reported on 05/05/2021) 8.5 each 1  ? escitalopram (LEXAPRO) 10 MG tablet TAKE 1 TABLET BY MOUTH  DAILY (Patient not taking: Reported on 05/05/2021) 90 tablet 3  ? ?No current facility-administered medications on file prior to visit.  ? ? ? ?ROS see history of present illness ? ?Objective ? ?Physical Exam ?Vitals:  ? 05/05/21 0852  ?BP: 120/80  ?Pulse: 73  ?Temp: 97.9 ?F (  36.6 ?C)  ?SpO2: 98%  ? ? ?BP Readings from Last 3 Encounters:  ?05/05/21 120/80  ?04/13/21 110/71  ?03/11/21 124/80  ? ?Wt Readings from Last 3 Encounters:  ?05/05/21 203 lb (92.1 kg)  ?04/13/21 195 lb (88.5 kg)  ?03/11/21 195 lb (88.5 kg)  ? ? ?Physical Exam ?Constitutional:   ?   General: He is not in acute distress. ?   Appearance: He is not diaphoretic.  ?HENT:  ?   Right Ear: Tympanic membrane normal.  ?   Left Ear: Tympanic membrane normal.  ?Cardiovascular:  ?   Rate and Rhythm: Normal rate and regular rhythm.  ?   Heart sounds: Normal heart sounds.  ?Pulmonary:  ?   Effort: Pulmonary effort is normal.  ?   Breath sounds: Normal breath sounds.  ?Skin: ?   General: Skin is warm and dry.  ?   Comments: Patient has a single dime size circular rough patch on both mid upper extremities and his  right mid calf, there is slight erythema  ?Neurological:  ?   Mental Status: He is alert.  ? ? ? ?Assessment/Plan: Please see individual problem list. ? ?Problem List Items Addressed This Visit   ? ? Adjustment disorder with mixed anxiety and depressed mood  ?  Chronic issue.  Stable.  He will remain off of daily medication at this time.  He can continue Xanax as prescribed which he rarely uses. ?  ?  ? Chronic low back pain with left-sided sciatica  ?  Chronic issue.  We will trial gabapentin 100 mg 3 times daily.  Discussed monitoring for excessive drowsiness with this.  He will discontinue his meloxicam as he wishes to get off of several pills.  We will see if the gabapentin is helpful for the back pain as well as his sciatica.  If he notices other side effects he will let us know. ?  ?  ? Relevant Medications  ? predniSONE (DELTASONE) 20 MG tablet  ? gabapentin (NEURONTIN) 100 MG capsule  ? Cough - Primary  ? Relevant Medications  ? doxycycline (VIBRA-TABS) 100 MG tablet  ? predniSONE (DELTASONE) 20 MG tablet  ? Other Relevant Orders  ? Novel Coronavirus, NAA ([facility])  ? [place] COVID-19  ? Hypertension  ?  Adequately controlled for his age.  He will continue losartan 50 mg once daily.  Recent BMP reviewed. ?  ?  ? Rash  ?  Could possibly be a tinea infection versus an atopic rash.  Has been improving with Vaseline.  Discussed if it does not continue to improve he could try over-the-counter hydrocortisone and Lotrimin to see if that would resolve the issue.  If it does not resolve with these things he will let us know. ?  ?  ? Respiratory infection  ?  Likely viral in nature.  We will test for COVID-19.  He will remain home until we have the results.  He reports a history of COPD and thus we will cover for COPD exacerbation as well with prednisone and doxycycline.  He was advised to contact us if he has excessive agitation or sleeping issues on the prednisone.  He was advised to seek medical attention for dyspnea,  cough productive of blood, or fevers. ?  ?  ? ? ? ?Return in about 6 months (around 11/05/2021) for Anxiety. ? ?This visit occurred during the SARS-CoV-2 public health emergency.  Safety protocols were in place, including screening questions prior to the visit, additional usage  of staff PPE, and extensive cleaning of exam room while observing appropriate contact time as indicated for disinfecting solutions.  ? ? ?Tyler Rumps, MD ?[place] ? ?

## 2021-05-05 NOTE — Patient Instructions (Signed)
Nice to see you. ?We will call with your covid test results. Please stay home until we get these results.  ?I am going to treat you for a COPD exacerbation as well with prednisone and doxycycline.  If you are unable to sleep or have excessive agitation while taking the prednisone please discontinue it and let us know. ?We will start you on gabapentin 100 mg 3 times daily.  If you have excessive drowsiness or noticed other side effects with this please let us know. ?If you develop increasing shortness of breath, cough productive of blood, or fevers please be reevaluated. ?

## 2021-05-19 ENCOUNTER — Telehealth: Payer: Self-pay | Admitting: Family Medicine

## 2021-05-19 NOTE — Telephone Encounter (Signed)
Spoke with patient he req CB 06/2021 to schedule AWV  ?

## 2021-06-14 ENCOUNTER — Telehealth: Payer: Self-pay | Admitting: Family Medicine

## 2021-06-14 NOTE — Telephone Encounter (Signed)
Spoke with patient he req a CB to sched AWV ?

## 2021-06-16 ENCOUNTER — Ambulatory Visit: Payer: Medicare Other | Admitting: Family Medicine

## 2021-06-18 ENCOUNTER — Ambulatory Visit (INDEPENDENT_AMBULATORY_CARE_PROVIDER_SITE_OTHER): Payer: Medicare Other | Admitting: Internal Medicine

## 2021-06-18 ENCOUNTER — Encounter: Payer: Self-pay | Admitting: Internal Medicine

## 2021-06-18 VITALS — BP 120/80 | HR 58 | Temp 97.8°F | Resp 14 | Ht 70.0 in | Wt 205.4 lb

## 2021-06-18 DIAGNOSIS — Z1283 Encounter for screening for malignant neoplasm of skin: Secondary | ICD-10-CM

## 2021-06-18 DIAGNOSIS — R051 Acute cough: Secondary | ICD-10-CM | POA: Diagnosis not present

## 2021-06-18 DIAGNOSIS — L719 Rosacea, unspecified: Secondary | ICD-10-CM | POA: Diagnosis not present

## 2021-06-18 MED ORDER — DOXYCYCLINE HYCLATE 100 MG PO TABS: 100.0000 mg | ORAL_TABLET | Freq: Two times a day (BID) | ORAL | 0 refills | Status: DC

## 2021-06-18 MED ORDER — AZELAIC ACID 15 % EX GEL: 1.0000 "application " | Freq: Two times a day (BID) | CUTANEOUS | 2 refills | Status: DC

## 2021-06-18 NOTE — Progress Notes (Signed)
Chief Complaint  ?Patient presents with  ? Acute Visit  ?  Pt c/o facial redness with small bumps that become itchy. Ongoing issue for awhile Dr.Sonnenberg has prescribed medicine in the past with minor help. Flare ups come/go  ? ?F/u  ?1. C/o rash to face cheeks and forehead and at times itchy and red it will come and go tried metrogel with PCP in the past note sure if helps he will be on the cough and face will get red  ? ? ?Review of Systems  ?Skin:  Positive for itching and rash.  ?Past Medical History:  ?Diagnosis Date  ? Anxiety   ? Arthritis   ? BPH (benign prostatic hyperplasia) 11/19/2013  ? Bulge of lumbar disc without myelopathy   ? L2/3 through L5/6  ? Bulging disc   ? C2/3, C3/4, C6/7  ? Cervical disc herniation   ? C4/5 and C5/6  ? Colon polyp 04/18/2011  ? Constipation 08/31/2012  ? Depression   ? Diverticulosis   ? 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 GERD (gastroesophageal reflux disease)   ? H/O diverticulitis of colon   ? Hemorrhoid   ? Hiatal hernia   ? Hyperlipidemia   ? Levoscoliosis   ? Sleep difficulties   ? Spinal stenosis in cervical region   ? cord abutment C4/5  ? Testicular pain, left   ? Typical atrial flutter ([place]) 11/2015  ? a. CHADS2VASc => 0; b. on Eliquis for pending ablation   ? ?Past Surgical History:  ?Procedure Laterality Date  ? ATRIAL FLUTTER ABLATION  02/17/2016  ? COLONOSCOPY  2014  ? COLONOSCOPY WITH PROPOFOL N/A 06/30/2016  ? Procedure: COLONOSCOPY WITH PROPOFOL;  Surgeon: Lucilla Lame, MD;  Location: [facility];  Service: Endoscopy;  Laterality: N/A;  ? ELECTROPHYSIOLOGIC STUDY N/A 01/04/2016  ? Procedure: Cardioversion;  Surgeon: Wellington Hampshire, MD;  Location: [facility];  Service: Cardiovascular;  Laterality: N/A;  ? ELECTROPHYSIOLOGIC STUDY N/A 02/17/2016  ? Procedure: A-Flutter Ablation;  Surgeon: Deboraha Sprang, MD;  Location: [facility];  Service: Cardiovascular;  Laterality: N/A;  ? GANGLION CYST EXCISION Right 1994  ? wrist &  back  ? HERNIA REPAIR Right 1994  ? abdominal repair with mesh  ? NASAL SEPTUM SURGERY  2011  ? Dr. [name]   ? ?Family History  ?Problem Relation Age of Onset  ? Hyperlipidemia Mother   ? Cancer Mother   ?     skin cancer?  ? Heart disease Mother 14  ?     MI - died in her sleep  ? Hyperlipidemia Father   ? Heart disease Father   ? Kidney disease Neg Hx   ? Prostate cancer Neg Hx   ? Kidney cancer Neg Hx   ? Bladder Cancer Neg Hx   ? ?Social History  ? ?Socioeconomic History  ? Marital status: Married  ?  Spouse name: [name]  ? Number of children: 1  ? Years of education: GED  ? Highest education level: Not on file  ?Occupational History  ? Occupation: Disability  ?  Comment: Herniated disk, spinal stenosis  ?Tobacco Use  ? Smoking status: Former  ?  Packs/day: 1.00  ?  Years: 20.00  ?  Pack years: 20.00  ?  Types: Cigarettes  ?  Quit date: 02/08/1991  ?  Years since quitting: 30.3  ? Smokeless tobacco: Never  ?Substance and Sexual Activity  ? Alcohol use: No  ?  Alcohol/week: 0.0  standard drinks  ? Drug use: No  ? Sexual activity: Yes  ?  Partners: Female  ?Other Topics Concern  ? Not on file  ?Social History Narrative  ? Lonzy was born and raised in [place], [place]. He quit school after the 8th grade because of financial hardship. He was working since age 22. He went back and got his GED. He moved to [place] in 2006. He lives at home with his wife of 58 years and their 84 year old daughter. Previously, Jurgen was married to his first wife for 20 years and they divorced. They had no children.  Salomon has been disabled 2/2 back problems.    ? ?Social Determinants of Health  ? ?Financial Resource Strain: Not on file  ?Food Insecurity: Not on file  ?Transportation Needs: Not on file  ?Physical Activity: Not on file  ?Stress: Not on file  ?Social Connections: Not on file  ?Intimate Partner Violence: Not on file  ? ?Current Meds  ?Medication Sig  ? ALPRAZolam (XANAX) 0.5 MG tablet TAKE 1 TABLET BY MOUTH EVERY NIGHT AT  BEDTIME AS NEEDED FOR ANXIETY  ? atorvastatin (LIPITOR) 20 MG tablet TAKE 1 TABLET BY MOUTH IN  THE MORNING  ? Azelaic Acid 15 % gel Apply 1 application. topically 2 (two) times daily. After skin is thoroughly washed and patted dry, gently but thoroughly massage a thin film of azelaic acid cream into the affected area twice daily, in the morning and evening.  ? cyclobenzaprine (FLEXERIL) 10 MG tablet TAKE 1 TABLET BY MOUTH 3  TIMES DAILY AS NEEDED FOR  MUSCLE SPASM(S)  ? doxycycline (VIBRA-TABS) 100 MG tablet Take 1 tablet (100 mg total) by mouth 2 (two) times daily. With food  ? finasteride (PROSCAR) 5 MG tablet TAKE 1 TABLET BY MOUTH  DAILY  ? fluticasone-salmeterol (ADVAIR HFA) 115-21 MCG/ACT inhaler Inhale 2 puffs into the lungs 2 (two) times daily.  ? gabapentin (NEURONTIN) 100 MG capsule Take 1 capsule (100 mg total) by mouth 3 (three) times daily.  ? loratadine (CLARITIN) 10 MG tablet Take 10 mg by mouth daily as needed for allergies.  ? losartan (COZAAR) 50 MG tablet TAKE 1 TABLET BY MOUTH  DAILY  ? meloxicam (MOBIC) 15 MG tablet TAKE 1 TABLET BY MOUTH  DAILY AS NEEDED FOR PAIN  ? pantoprazole (PROTONIX) 40 MG tablet TAKE 1 TABLET BY MOUTH  DAILY  ? predniSONE (DELTASONE) 20 MG tablet Take 2 tablets (40 mg total) by mouth daily with breakfast.  ? tadalafil (CIALIS) 5 MG tablet TAKE 1 TABLET BY MOUTH  DAILY  ? TRULANCE 3 MG TABS TAKE 1 TABLET BY MOUTH  DAILY  ? [DISCONTINUED] doxycycline (VIBRA-TABS) 100 MG tablet Take 1 tablet (100 mg total) by mouth 2 (two) times daily.  ? ?Allergies  ?Allergen Reactions  ? Other Other (See Comments)  ?  Pollen:  Sinus infection, cough, fatigue ? ?Other reaction(s): Other (See Comments), Other (See Comments) ?Pollen:  Sinus infection, cough, fatigue ?Pollen:  Sinus infection, cough, fatigue  ? ?Recent Results (from the past 2160 hour(s))  ?Bladder Scan (Post Void Residual) in office     Status: None  ? Collection Time: 04/13/21  8:39 AM  ?Result Value Ref Range  ? Scan  Result 169m   ?Basic Metabolic Panel (BMET)     Status: None  ? Collection Time: 04/30/21  7:33 AM  ?Result Value Ref Range  ? Sodium 142 135 - 145 mEq/L  ? Potassium 4.7 3.5 - 5.1  mEq/L  ? Chloride 105 96 - 112 mEq/L  ? CO2 32 19 - 32 mEq/L  ? Glucose, Bld 94 70 - 99 mg/dL  ? BUN 20 6 - 23 mg/dL  ? Creatinine, Ser 0.94 0.40 - 1.50 mg/dL  ? GFR 85.37 >60.00 mL/min  ?  Comment: Calculated using the CKD-EPI Creatinine Equation (2021)  ? Calcium 9.5 8.4 - 10.5 mg/dL  ?POC COVID-19     Status: Abnormal  ? Collection Time: 05/05/21  9:43 AM  ?Result Value Ref Range  ? SARS Coronavirus 2 Ag Positive (A) Negative  ? ?Objective  ?Body mass index is 29.47 kg/m?. ?Wt Readings from Last 3 Encounters:  ?06/18/21 205 lb 6.4 oz (93.2 kg)  ?05/05/21 203 lb (92.1 kg)  ?04/13/21 195 lb (88.5 kg)  ? ?Temp Readings from Last 3 Encounters:  ?06/18/21 97.8 ?F (36.6 ?C) (Oral)  ?05/05/21 97.9 ?F (36.6 ?C) (Oral)  ?03/11/21 (!) 97.3 ?F (36.3 ?C) (Temporal)  ? ?BP Readings from Last 3 Encounters:  ?06/18/21 120/80  ?05/05/21 120/80  ?04/13/21 110/71  ? ?Pulse Readings from Last 3 Encounters:  ?06/18/21 (!) 58  ?05/05/21 73  ?04/13/21 65  ? ? ?Physical Exam ?Vitals and nursing note reviewed.  ?Constitutional:   ?   Appearance: Normal appearance. He is well-developed and well-groomed.  ?HENT:  ?   Head: Normocephalic and atraumatic.  ?Eyes:  ?   Conjunctiva/sclera: Conjunctivae normal.  ?   Pupils: Pupils are equal, round, and reactive to light.  ?Cardiovascular:  ?   Rate and Rhythm: Normal rate and regular rhythm.  ?   Heart sounds: Normal heart sounds.  ?Pulmonary:  ?   Effort: Pulmonary effort is normal. No respiratory distress.  ?   Breath sounds: Normal breath sounds.  ?Abdominal:  ?   Tenderness: There is no abdominal tenderness.  ?Skin: ?   General: Skin is warm and moist.  ?Neurological:  ?   General: No focal deficit present.  ?   Mental Status: He is alert and oriented to person, place, and time. Mental status is at baseline.  ?    Sensory: Sensation is intact.  ?   Motor: Motor function is intact.  ?   Coordination: Coordination is intact.  ?   Gait: Gait is intact. Gait normal.  ?Psychiatric:     ?   Attention and Perception: Attenti

## 2021-06-18 NOTE — Patient Instructions (Addendum)
? ?Administrator Address  ?[phone] Not available [facility]  ? [place] [address]  ? ?Azelaic Acid Gel ?What is this medication? ?AZELAIC ACID (ay ze LAY ik AS id) treats rosacea. It works by decreasing inflammation and killing or preventing the growth of bacteria on your skin. ?This medicine may be used for other purposes; ask your health care provider or pharmacist if you have questions. ?COMMON BRAND NAME(S): Finacea ?What should I tell my care team before I take this medication? ?They need to know if you have any of these conditions: ?Infection especially a viral infection such as chickenpox, cold sores, or herpes ?Lung or breathing disease (asthma) ?An unusual or allergic reaction to azelaic acid, other medications, foods, dyes, or preservatives ?Pregnant or trying to get pregnant ?Breast-feeding ?How should I use this medication? ?This medication is for external use only. Do not take by mouth. Wash your hands before and after use. Do not get it in your eyes. If you do, rinse your eyes with plenty of cool tap water. Use it as directed on the prescription label at the same time every day. Do not use it more often than directed. Use the medication for the full course as directed by your care team, even if you think you are better. Do not stop using it unless your care team tells you to stop it early. Apply a thin film of the medication to the affected area. Talk to your care team about the use of this medication in children. Special care may be needed. ?Overdosage: If you think you have taken too much of this medicine contact a poison control center or emergency room at once. ?NOTE: This medicine is only for you. Do not share this medicine with others. ?What if I miss a dose? ?If you miss a dose, take it as soon as you can. If it is almost time for your next dose, take only that dose. Do not take double or extra doses. ?What may interact with this medication? ?Interactions are not  expected. Do not use any other skin products without telling your care team. ?This list may not describe all possible interactions. Give your health care provider a list of all the medicines, herbs, non-prescription drugs, or dietary supplements you use. Also tell them if you smoke, drink alcohol, or use illegal drugs. Some items may interact with your medicine. ?What should I watch for while using this medication? ?Visit your care team for regular checks on your progress. It may be some time before you see the benefit from this medication. Do not use other products that dry the skin. Examples include abrasive cleaners or products with alcohol in them. Do not use other acne products on the same areas of the skin as this one unless your care team tells you to use both. Avoid eating or drinking foods or drinks that may make skin redness, flushing, and blushing worse. Examples include spicy foods, alcohol, hot coffee, or hot tea. ?What side effects may I notice from receiving this medication? ?Side effects that you should report to your care team as soon as possible: ?Allergic reactions or angioedema--skin rash, itching or hives, swelling of the face, eyes, lips, tongue, arms, or legs, trouble swallowing or breathing ?Burning, itching, crusting, or peeling of treated skin ?Wheezing or trouble breathingSide effects that usually do not require medical attention (report to your care team if they continue or are bothersome): ?Change in skin color ?Mild skin irritation, redness, or dryness ?This  list may not describe all possible side effects. Call your doctor for medical advice about side effects. You may report side effects to FDA at 1-800-FDA-1088. ?Where should I keep my medication? ?Keep out of the reach of children and pets. Store at room temperature between 20 and 25 degrees C (68 and 77 degrees F). ?Get rid of medications that are no longer needed or have expired: ?Take the medication to a medication take-back  program. Check with your pharmacy or law enforcement to find a location. ?If you cannot return the medication, check the label or package insert to see if the medication should be thrown out in the garbage or flushed down the toilet. If you are not sure, ask your care team. If it is safe to put in the trash, take the medication out of the container. Mix the medication with cat litter, dirt, coffee grounds, or other unwanted substance. Seal the mixture in a bag or container. Put it in the trash. ?NOTE: This sheet is a summary. It may not cover all possible information. If you have questions about this medicine, talk to your doctor, pharmacist, or health care provider. ?? 2023 Elsevier/Gold Standard (2020-11-25 00:00:00) ? ?Rosacea ?Rosacea is a long-term (chronic) condition that affects the skin of the face, including the cheeks, nose, forehead, and chin. This condition can also affect the eyes. Rosacea causes blood vessels near the surface of the skin to enlarge, which results in redness. ?What are the causes? ?The cause of this condition is not known. Certain triggers can make rosacea worse, including: ?Hot baths. ?Exercise. ?Sunlight. ?Very hot or cold temperatures. ?Hot or spicy foods and drinks. ?Drinking alcohol. ?Stress. ?Taking blood pressure medicine. ?Long-term use of topical steroids on the face. ?What increases the risk? ?You are more likely to develop this condition if you: ?Are older than 65 years of age. ?Are a woman. ?Have light-colored skin (light complexion). ?Have a family history of rosacea. ?What are the signs or symptoms? ?Symptoms of this condition include: ?Redness of the face. ?Red bumps or pimples on the face. ?A red, enlarged nose. ?Blushing easily. ?Red lines on the skin. ?Irritated, burning, or itchy feeling in the eyes. ?Swollen eyelids. ?Drainage from the eyes. ?Feeling like there is something in your eye. ?How is this diagnosed? ?This condition is diagnosed with a medical history and  physical exam. ?How is this treated? ?There is no cure for this condition, but treatment can help to control your symptoms. Your health care provider may recommend that you see a skin specialist (dermatologist). Treatment may include: ?Medicines that are applied to the skin or taken by mouth (orally). This can include antibiotic medicines. ?Laser treatment to improve the appearance of the skin. ?Surgery. This is rare. ?Your health care provider will also recommend the best way to take care of your skin. Even after your skin improves, you will likely need to continue treatment to prevent your rosacea from coming back. ?Follow these instructions at home: ?Skin care ?Take care of your skin as told by your health care provider. You may be told to do these things: ?Wash your skin gently two or more times each day. ?Use mild soap. ?Use a sunscreen or sunblock with SPF 30 or greater. ?Use gentle cosmetics that are meant for sensitive skin. ?Shave with an electric shaver instead of a blade. ?Lifestyle ?Try to keep track of what foods trigger this condition. Avoid any triggers. These may include: ?Spicy foods. ?Seafood. ?Cheese. ?Hot liquids. ?Nuts. ?Chocolate. ?Iodized salt. ?Do not  drink alcohol. ?Avoid extremely cold or hot temperatures. ?Try to reduce your stress. If you need help, talk with your health care provider. ?When you exercise, do these things to stay cool: ?Limit sun exposure to your face. ?Use a fan. ?Do shorter and more frequent intervals of exercise. ?General instructions ?Take and apply over-the-counter and prescription medicines only as told by your health care provider. ?If you were prescribed an antibiotic medicine, apply it or take it as told by your health care provider. Do not stop using the antibiotic even if your condition improves. ?If your eyelids are affected, apply warm compresses to them. Do this as told by your health care provider. ?Keep all follow-up visits as told by your health care  provider. This is important. ?Contact a health care provider if: ?Your symptoms get worse. ?Your symptoms do not improve after 2 months of treatment. ?You have new symptoms. ?You have any changes in vision or you

## 2021-07-05 ENCOUNTER — Other Ambulatory Visit: Payer: Self-pay | Admitting: Internal Medicine

## 2021-07-05 DIAGNOSIS — L719 Rosacea, unspecified: Secondary | ICD-10-CM

## 2021-07-07 ENCOUNTER — Ambulatory Visit (INDEPENDENT_AMBULATORY_CARE_PROVIDER_SITE_OTHER): Payer: Medicare Other

## 2021-07-07 VITALS — Ht 70.0 in | Wt 205.0 lb

## 2021-07-07 DIAGNOSIS — Z0000 Encounter for general adult medical examination without abnormal findings: Secondary | ICD-10-CM | POA: Diagnosis not present

## 2021-07-07 NOTE — Telephone Encounter (Signed)
Please see if the patients facial redness has improved with the doxycycline. Thanks.

## 2021-07-07 NOTE — Patient Instructions (Addendum)
  Mr. Herne , Thank you for taking time to come for your Medicare Wellness Visit. I appreciate your ongoing commitment to your health goals. Please review the following plan we discussed and let me know if I can assist you in the future.   These are the goals we discussed:  Goals      Maintain Healthy Lifestyle     Healthy diet Stay active, stretch        This is a list of the screening recommended for you and due dates:  Health Maintenance  Topic Date Due   COVID-19 Vaccine (4 - Booster for [employer] series) 07/23/2021*   Zoster (Shingles) Vaccine (2 of 2) 10/07/2021*   Flu Shot  09/07/2021   Pneumonia Vaccine (2 - PCV) 11/30/2021   Tetanus Vaccine  10/18/2022   Colon Cancer Screening  07/01/2026   Hepatitis C Screening: USPSTF Recommendation to screen - Ages 18-79 yo.  Completed   HIV Screening  Completed   HPV Vaccine  Aged Out  *Topic was postponed. The date shown is not the original due date.

## 2021-07-07 NOTE — Progress Notes (Signed)
Subjective:   [name]. is a 66 y.o. male who presents for Medicare Annual/Subsequent preventive examination.  Review of Systems    No ROS.  Medicare Wellness Virtual Visit.  Visual/audio telehealth visit, UTA vital signs.   See social history for additional risk factors.   Cardiac Risk Factors include: advanced age (>26mn, >>73women);male gender     Objective:    Today's Vitals   07/07/21 0947  Weight: 205 lb (93 kg)  Height: '5\' 10"'$  (1.778 m)   Body mass index is 29.41 kg/m.     07/07/2021    9:52 AM 02/25/2021    1:08 PM 02/09/2021    7:58 AM 02/04/2021    9:24 AM 01/19/2021    8:06 AM 04/20/2020    3:13 PM 08/16/2016    8:34 AM  Advanced Directives  Does Patient Have a Medical Advance Directive? Yes Yes Yes Yes No No Yes  Type of APrimary school te[place]rof [place] will  Does patient want to make changes to medical advance directive? No - Patient declined      No - Patient declined  Copy of [place] Chart? No - copy requested      No - copy requested  Would patient like information on creating a medical advance directive?     No - Patient declined No - Patient declined     Current Medications (verified) Outpatient Encounter Medications as of 07/07/2021  Medication Sig   albuterol (VENTOLIN HFA) 108 (90 Base) MCG/ACT inhaler TAKE 2 PUFFS BY MOUTH EVERY 6 HOURS AS NEEDED FOR WHEEZE OR SHORTNESS OF BREATH (Patient not taking: Reported on 06/18/2021)   ALPRAZolam (XANAX) 0.5 MG tablet TAKE 1 TABLET BY MOUTH EVERY NIGHT AT BEDTIME AS NEEDED FOR ANXIETY   atorvastatin (LIPITOR) 20 MG tablet TAKE 1 TABLET BY MOUTH IN  THE MORNING   Azelaic Acid 15 % gel Apply 1 application. topically 2 (two) times daily. After skin is thoroughly washed and patted dry, gently but thoroughly massage a thin film of azelaic acid cream into the affected area twice daily, in the morning and  evening.   cyclobenzaprine (FLEXERIL) 10 MG tablet TAKE 1 TABLET BY MOUTH 3  TIMES DAILY AS NEEDED FOR  MUSCLE SPASM(S)   doxycycline (VIBRA-TABS) 100 MG tablet Take 1 tablet (100 mg total) by mouth 2 (two) times daily. With food   finasteride (PROSCAR) 5 MG tablet TAKE 1 TABLET BY MOUTH  DAILY   fluticasone-salmeterol (ADVAIR HFA) 115-21 MCG/ACT inhaler Inhale 2 puffs into the lungs 2 (two) times daily.   gabapentin (NEURONTIN) 100 MG capsule Take 1 capsule (100 mg total) by mouth 3 (three) times daily.   loratadine (CLARITIN) 10 MG tablet Take 10 mg by mouth daily as needed for allergies.   losartan (COZAAR) 50 MG tablet TAKE 1 TABLET BY MOUTH  DAILY   meloxicam (MOBIC) 15 MG tablet TAKE 1 TABLET BY MOUTH  DAILY AS NEEDED FOR PAIN   pantoprazole (PROTONIX) 40 MG tablet TAKE 1 TABLET BY MOUTH  DAILY   predniSONE (DELTASONE) 20 MG tablet Take 2 tablets (40 mg total) by mouth daily with breakfast.   tadalafil (CIALIS) 5 MG tablet TAKE 1 TABLET BY MOUTH  DAILY   TRULANCE 3 MG TABS TAKE 1 TABLET BY MOUTH  DAILY   No facility-administered encounter medications on file as of 07/07/2021.    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 CHADS2VASc => 0; b. on Eliquis for pending ablation    Past Surgical History:   Procedure Laterality Date   ATRIAL FLUTTER ABLATION  02/17/2016   COLONOSCOPY  2014   COLONOSCOPY WITH PROPOFOL N/A 06/30/2016   Procedure: COLONOSCOPY WITH PROPOFOL;  Surgeon: Lucilla Lame, MD;  Location: [facility];  Service: Endoscopy;  Laterality: N/A;   ELECTROPHYSIOLOGIC STUDY N/A 01/04/2016   Procedure: Cardioversion;  Surgeon: Wellington Hampshire, MD;  Location: [facility];  Service: Cardiovascular;  Laterality: N/A;   ELECTROPHYSIOLOGIC STUDY N/A 02/17/2016   Procedure: A-Flutter Ablation;  Surgeon: Deboraha Sprang, MD;  Location: [facility];  Service: Cardiovascular;  Laterality: N/A;   GANGLION CYST EXCISION Right 1994   wrist & back   HERNIA REPAIR Right 1994   abdominal repair with mesh   NASAL SEPTUM SURGERY  2011   Dr. [name]    Family History  Problem Relation Age of Onset   Hyperlipidemia Mother    Cancer Mother        skin cancer?   Heart disease Mother 68       MI - died in her sleep   Hyperlipidemia Father    Heart disease Father    Kidney disease Neg Hx    Prostate cancer Neg Hx    Kidney cancer Neg Hx    Bladder Cancer Neg Hx    Social History   Socioeconomic History   Marital status: Married    Spouse name: [name]   Number of children: 1   Years of education: GED   Highest education level: Not on file  Occupational History   Occupation: Disability    Comment: Herniated disk, spinal stenosis  Tobacco Use   Smoking status: Former    Packs/day: 1.00    Years: 20.00    Pack years: 20.00    Types: Cigarettes    Quit date: 02/08/1991    Years since quitting: 30.4   Smokeless tobacco: Never  Substance and Sexual Activity   Alcohol use: No    Alcohol/week: 0.0 standard drinks   Drug use: No   Sexual activity: Yes    Partners: Female  Other Topics Concern   Not on file  Social History Narrative   [name] was born and raised in [place], [place]. He quit school after the 8th grade because of financial hardship. He was working since age 11.  He went back and got his GED. He moved to [place] in 2006. He lives at home with his wife of 32 years and their 65 year old daughter. Previously, [name] was married to his first wife for 20 years  and they divorced. They had no children.  [name] has been disabled 2/2 back problems.     Social Determinants of Health   Financial Resource Strain: Low Risk    Difficulty of Paying Living Expenses: Not hard at all  Food Insecurity: No Food Insecurity   Worried About Charity fundraiser in the Last Year: Never true   [place] in the Last Year: Never true  Transportation Needs: No Transportation Needs   Lack of Transportation (Medical): No   Lack of Transportation (Non-Medical): No  Physical Activity: Not on file  Stress: No Stress Concern Present   Feeling of Stress : Not at all  Social Connections: Unknown   Frequency of Communication with Friends and Family: More than three times a week   Frequency of Social Gatherings with Friends and Family: Not on file   Attends Religious Services: Not on file   Active Member of Clubs or Organizations: Not on file   Attends Archivist Meetings: Not on file   Marital Status: Married    Tobacco Counseling Counseling given: Not Answered   Clinical Intake:  Pre-visit preparation completed: Yes        Diabetes: No  How often do you need to have someone help you when you read instructions, pamphlets, or other written materials from your doctor or pharmacy?: 1 - Never Interpreter Needed?: No    Activities of Daily Living    07/07/2021    9:54 AM  In your present state of health, do you have any difficulty performing the following activities:  Hearing? 0  Vision? 0  Difficulty concentrating or making decisions? 0  Walking or climbing stairs? 1  Comment Paces self when walking  Dressing or bathing? 0  Doing errands, shopping? 0  Preparing Food and eating ? N  Using the Toilet? N  In the past six months, have you accidently  leaked urine? N  Do you have problems with loss of bowel control? N  Managing your Medications? N  Managing your Finances? N  Housekeeping or managing your Housekeeping? N  Comment Paces self with activity    Patient Care Team: [name], MD as PCP - General (Family Medicine) Bary Castilla, Forest Gleason, MD (General Surgery) [name], MD as Consulting Physician (Family Medicine) Bary Castilla Forest Gleason, MD (General Surgery)  Indicate any recent Medical Services you may have received from other than [facility] providers in the past year (date may be approximate).     Assessment:   This is a routine wellness examination for [name].  Virtual Visit via Telephone Note  I connected with  [name]. on 07/07/21 at  9:45 AM EDT by telephone and verified that I am speaking with the correct person using two identifiers.  Persons participating in the virtual visit: patient/Nurse Health Advisor   I discussed the limitations of performing an evaluation and management service by telehealth. We continued and completed visit with audio only. Some vital signs may be absent or patient reported.    Hearing/Vision screen Hearing Screening - Comments:: Patient is able to hear conversational tones without difficulty.  No issues reported. Vision Screening - Comments:: Followed by [facility]  Wears corrective lenses when reading  They have regular follow up with the ophthalmologist  Dietary issues and exercise activities discussed:   Regular diet   Goals Addressed             This Visit's Progress    Maintain Healthy  Lifestyle       Healthy diet Stay active, stretch       Depression Screen    06/18/2021    8:05 AM 05/05/2021    8:54 AM 02/25/2021    1:08 PM 02/09/2021    7:58 AM 02/04/2021    9:24 AM 01/19/2021    8:01 AM 04/20/2020    3:14 PM  PHQ 2/9 Scores  PHQ - 2 Score 1 0 0 0 0 0 0    Fall Risk    06/18/2021    8:05 AM 05/05/2021    8:53 AM 02/25/2021    1:08 PM  02/09/2021    7:58 AM 02/04/2021    9:24 AM  Fall Risk   Falls in the past year? 0 0 0 0 0  Number falls in past yr: 0 0     Injury with Fall? 0 0     Risk for fall due to : No Fall Risks No Fall Risks     Follow up Falls evaluation completed Falls evaluation completed       [place]: Home free of loose throw rugs in walkways, pet beds, electrical cords, etc? Yes  Adequate lighting in your home to reduce risk of falls? Yes   ASSISTIVE DEVICES UTILIZED TO PREVENT FALLS: Life alert? No  Use of a cane, walker or w/c? No   TIMED UP AND GO: Was the test performed? No .   Cognitive Function: Patient is alert and oriented x3.     08/16/2016    8:37 AM  MMSE - Mini Mental State Exam  Orientation to time 5  Orientation to Place 5  Registration 3  Attention/ Calculation 5  Recall 3  Language- name 2 objects 2  Language- repeat 1  Language- follow 3 step command 3  Language- read & follow direction 1  Write a sentence 1  Copy design 1  Total score 30       Immunizations Immunization History  Administered Date(s) Administered   Influenza Whole 11/18/2010   Influenza,inj,Quad PF,6+ Mos 10/17/2012, 11/02/2013, 10/16/2014, 10/12/2016, 10/17/2017, 10/17/2018, 10/17/2019, 10/04/2020   Influenza-Unspecified 11/02/2011, 10/17/2012, 11/02/2013, 10/22/2015, 10/17/2019   PFIZER(Purple Top)SARS-COV-2 Vaccination 04/27/2019, 05/19/2019, 01/15/2020   Pneumococcal Polysaccharide-23 11/30/2020   Tdap 10/17/2012   Zoster Recombinat (Shingrix) 07/21/2020   Screening Tests Health Maintenance  Topic Date Due   COVID-19 Vaccine (4 - Booster for [employer] series) 07/23/2021 (Originally 03/11/2020)   Zoster Vaccines- Shingrix (2 of 2) 10/07/2021 (Originally 09/15/2020)   INFLUENZA VACCINE  09/07/2021   Pneumonia Vaccine 72+ Years old (2 - PCV) 11/30/2021   TETANUS/TDAP  10/18/2022   COLONOSCOPY (Pts 45-53yr Insurance coverage will need to be confirmed)   07/01/2026   Hepatitis C Screening  Completed   HIV Screening  Completed   HPV VACCINES  Aged Out   Health Maintenance There are no preventive care reminders to display for this patient.  Lung Cancer Screening: (Low Dose CT Chest recommended if Age 65-80years, 30 pack-year currently smoking OR have quit w/in 15years.) does not qualify.   Vision Screening: Recommended annual ophthalmology exams for early detection of glaucoma and other disorders of the eye.  Dental Screening: Recommended annual dental exams for proper oral hygiene  Community Resource Referral / Chronic Care Management: CRR required this visit?  No   CCM required this visit?  No      Plan:     I have personally reviewed and noted the following in the  patient's chart:   Medical and social history Use of alcohol, tobacco or illicit drugs  Current medications and supplements including opioid prescriptions. Patient is not currently taking opioid prescriptions. Functional ability and status Nutritional status Physical activity Advanced directives List of other physicians Hospitalizations, surgeries, and ER visits in previous 12 months Vitals Screenings to include cognitive, depression, and falls Referrals and appointments  In addition, I have reviewed and discussed with patient certain preventive protocols, quality metrics, and best practice recommendations. A written personalized care plan for preventive services as well as general preventive health recommendations were provided to patient.     Varney Biles, LPN   [id number]

## 2021-07-08 ENCOUNTER — Other Ambulatory Visit: Payer: Self-pay

## 2021-07-08 ENCOUNTER — Telehealth: Payer: Self-pay | Admitting: Family Medicine

## 2021-07-08 DIAGNOSIS — G8929 Other chronic pain: Secondary | ICD-10-CM

## 2021-07-08 MED ORDER — GABAPENTIN 100 MG PO CAPS: 100.0000 mg | ORAL_CAPSULE | Freq: Three times a day (TID) | ORAL | 3 refills | Status: DC

## 2021-07-08 NOTE — Telephone Encounter (Signed)
Noted. I would like to see if a lower dose of doxycycline is as beneficial. This may help reduce the risk of side effects. I sent this to the pharmacy.

## 2021-07-08 NOTE — Telephone Encounter (Signed)
Pt stated that his facial redness was a lot better. He said that he does not like taking any medications that are not necessary but the doxy had helped. He als said that appointment had been now scheduled for dermatology in November.

## 2021-07-08 NOTE — Telephone Encounter (Signed)
Patient is requesting a refill on his gabapentin (NEURONTIN) 100 MG capsule . He would like a 90 day supply sent to [facility].

## 2021-07-08 NOTE — Telephone Encounter (Signed)
Medication was sent to [facility] per patient request and Rx for this medication was discontinued at [facility].  Alijah Hyde,cma

## 2021-07-09 NOTE — Telephone Encounter (Signed)
Pt called and notified lower dose was sent. He has already picked up. He said that he would let us know if beneficial to him.

## 2021-07-19 ENCOUNTER — Other Ambulatory Visit: Payer: Self-pay | Admitting: Urology

## 2021-07-19 DIAGNOSIS — N529 Male erectile dysfunction, unspecified: Secondary | ICD-10-CM

## 2021-07-19 DIAGNOSIS — N401 Enlarged prostate with lower urinary tract symptoms: Secondary | ICD-10-CM

## 2021-07-22 ENCOUNTER — Telehealth: Payer: Self-pay | Admitting: Family Medicine

## 2021-07-22 DIAGNOSIS — L719 Rosacea, unspecified: Secondary | ICD-10-CM

## 2021-07-22 MED ORDER — DOXYCYCLINE HYCLATE 50 MG PO CAPS: 50.0000 mg | ORAL_CAPSULE | Freq: Two times a day (BID) | ORAL | 1 refills | Status: DC

## 2021-07-22 NOTE — Telephone Encounter (Signed)
Sent to local pharmacy for a 90 day supply.

## 2021-07-22 NOTE — Addendum Note (Signed)
Addended by: [name] on: 07/22/2021 10:53 AM   Modules accepted: Orders

## 2021-07-22 NOTE — Telephone Encounter (Signed)
Patient is requesting a refill on his doxycycline (VIBRAMYCIN) 50 MG capsule.

## 2021-07-24 ENCOUNTER — Other Ambulatory Visit: Payer: Self-pay | Admitting: Family Medicine

## 2021-07-31 ENCOUNTER — Other Ambulatory Visit: Payer: Self-pay | Admitting: Family

## 2021-07-31 DIAGNOSIS — F4323 Adjustment disorder with mixed anxiety and depressed mood: Secondary | ICD-10-CM

## 2021-08-12 ENCOUNTER — Other Ambulatory Visit: Payer: Self-pay

## 2021-08-12 DIAGNOSIS — L719 Rosacea, unspecified: Secondary | ICD-10-CM

## 2021-08-13 ENCOUNTER — Other Ambulatory Visit: Payer: Self-pay | Admitting: Urology

## 2021-08-13 DIAGNOSIS — R972 Elevated prostate specific antigen [PSA]: Secondary | ICD-10-CM

## 2021-08-13 MED ORDER — DOXYCYCLINE HYCLATE 50 MG PO CAPS: 50.0000 mg | ORAL_CAPSULE | Freq: Two times a day (BID) | ORAL | 1 refills | Status: DC

## 2021-09-03 ENCOUNTER — Other Ambulatory Visit: Payer: Self-pay

## 2021-09-03 DIAGNOSIS — R972 Elevated prostate specific antigen [PSA]: Secondary | ICD-10-CM

## 2021-09-03 NOTE — Telephone Encounter (Signed)
Request Finasteride be sent to [facility].

## 2021-09-05 MED ORDER — FINASTERIDE 5 MG PO TABS: 5.0000 mg | ORAL_TABLET | Freq: Every day | ORAL | 3 refills | Status: DC

## 2021-09-15 ENCOUNTER — Encounter: Payer: Self-pay | Admitting: Family Medicine

## 2021-09-15 ENCOUNTER — Telehealth: Payer: Self-pay

## 2021-09-15 ENCOUNTER — Ambulatory Visit (INDEPENDENT_AMBULATORY_CARE_PROVIDER_SITE_OTHER): Payer: Medicare Other | Admitting: Family Medicine

## 2021-09-15 VITALS — BP 118/80 | HR 78 | Temp 98.1°F | Ht 70.0 in | Wt 203.2 lb

## 2021-09-15 DIAGNOSIS — K219 Gastro-esophageal reflux disease without esophagitis: Secondary | ICD-10-CM

## 2021-09-15 DIAGNOSIS — Z136 Encounter for screening for cardiovascular disorders: Secondary | ICD-10-CM

## 2021-09-15 DIAGNOSIS — E785 Hyperlipidemia, unspecified: Secondary | ICD-10-CM

## 2021-09-15 DIAGNOSIS — J449 Chronic obstructive pulmonary disease, unspecified: Secondary | ICD-10-CM | POA: Diagnosis not present

## 2021-09-15 DIAGNOSIS — I700 Atherosclerosis of aorta: Secondary | ICD-10-CM

## 2021-09-15 LAB — COMPREHENSIVE METABOLIC PANEL
ALT: 21 U/L (ref 0–53)
AST: 23 U/L (ref 0–37)
Albumin: 4.3 g/dL (ref 3.5–5.2)
Alkaline Phosphatase: 57 U/L (ref 39–117)
BUN: 16 mg/dL (ref 6–23)
CO2: 30 mEq/L (ref 19–32)
Calcium: 9.8 mg/dL (ref 8.4–10.5)
Chloride: 102 mEq/L (ref 96–112)
Creatinine, Ser: 0.87 mg/dL (ref 0.40–1.50)
GFR: 90.63 mL/min (ref >60.00)
Glucose, Bld: 92 mg/dL (ref 70–99)
Potassium: 4.2 mEq/L (ref 3.5–5.1)
Sodium: 138 mEq/L (ref 135–145)
Total Bilirubin: 1.6 mg/dL — ABNORMAL HIGH (ref 0.2–1.2)
Total Protein: 6.7 g/dL (ref 6.0–8.3)

## 2021-09-15 LAB — LIPID PANEL
Cholesterol: 156 mg/dL (ref 0–200)
HDL: 51.7 mg/dL (ref >39.00)
LDL Cholesterol: 90 mg/dL (ref 0–99)
NonHDL: 103.95
Total CHOL/HDL Ratio: 3
Triglycerides: 72 mg/dL (ref 0.0–149.0)
VLDL: 14.4 mg/dL (ref 0.0–40.0)

## 2021-09-15 MED ORDER — UMECLIDINIUM BROMIDE 62.5 MCG/ACT IN AEPB: 1.0000 | INHALATION_SPRAY | Freq: Every day | RESPIRATORY_TRACT | 3 refills | Status: DC

## 2021-09-15 MED ORDER — ESOMEPRAZOLE MAGNESIUM 40 MG PO CPDR: 40.0000 mg | DELAYED_RELEASE_CAPSULE | Freq: Every day | ORAL | 3 refills | Status: DC

## 2021-09-15 NOTE — Patient Instructions (Signed)
Nice to see you. Somebody should contact you about scheduling the coronary calcium score scan.  Please make sure you ask them what is going to cost you. We are going to start you on Incruse for your COPD.  You will use this in addition to the Advair. We will switch your Protonix to Nexium. If your cough persists please let us know. I am to see back in 6 weeks to see how things are going.

## 2021-09-15 NOTE — Telephone Encounter (Signed)
Patient states he called the number to schedule his scan and the person he spoke with told him our office has to schedule the appointment for him.  I spoke with our Referral Coordinator,  Esau Grew, and transferred the call to her.

## 2021-09-15 NOTE — Assessment & Plan Note (Signed)
Check lipid panel.  Continue Lipitor 20 mg daily.  Will order coronary artery calcium scan.

## 2021-09-15 NOTE — Progress Notes (Signed)
Tyler Rumps, MD Phone: [phone]  [name]. is a 66 y.o. male who presents today for f/u.  COPD: Medication compliance- advair daily  Rescue inhaler use- occasional albuterol use Dyspnea- intermittently  Wheezing- intermittently  Cough- most days for several months  Productive- phlegm No PND.   GERD:   Reflux symptoms: not much burning though does have some cough   Abd pain: occasional left sided pain relieved by having a BM, notes it feels like he needs to have a BM when this occurs  Blood in stool: no  Dysphagia: no   EGD: no  Medication: protonix  HYPERLIPIDEMIA Symptoms Chest pain on exertion:  no   Leg claudication:   no Medications: Compliance- taking lipitor Right upper quadrant pain- no  Muscle aches- no Patient wonders about checking his coronary arteries. Lipid Panel     Component Value Date/Time   CHOL 151 07/29/2020 1432   TRIG 81.0 07/29/2020 1432   HDL 47.90 07/29/2020 1432   CHOLHDL 3 07/29/2020 1432   VLDL 16.2 07/29/2020 1432   LDLCALC 87 07/29/2020 1432   LDLDIRECT 87.0 04/29/2019 0801      Social History   Tobacco Use  Smoking Status Former   Packs/day: 1.00   Years: 20.00   Total pack years: 20.00   Types: Cigarettes   Quit date: 02/08/1991   Years since quitting: 30.6  Smokeless Tobacco Never    Current Outpatient Medications on File Prior to Visit  Medication Sig Dispense Refill   albuterol (VENTOLIN HFA) 108 (90 Base) MCG/ACT inhaler TAKE 2 PUFFS BY MOUTH EVERY 6 HOURS AS NEEDED FOR WHEEZE OR SHORTNESS OF BREATH 8.5 each 1   atorvastatin (LIPITOR) 20 MG tablet TAKE 1 TABLET BY MOUTH IN  THE MORNING 90 tablet 3   Azelaic Acid 15 % gel Apply 1 application. topically 2 (two) times daily. After skin is thoroughly washed and patted dry, gently but thoroughly massage a thin film of azelaic acid cream into the affected area twice daily, in the morning and evening. 50 g 2   cyclobenzaprine (FLEXERIL) 10 MG tablet TAKE 1 TABLET BY  MOUTH 3  TIMES DAILY AS NEEDED FOR  MUSCLE SPASM(S) 90 tablet 0   doxycycline (VIBRAMYCIN) 50 MG capsule Take 1 capsule (50 mg total) by mouth 2 (two) times daily. 180 capsule 1   finasteride (PROSCAR) 5 MG tablet Take 1 tablet (5 mg total) by mouth daily. 90 tablet 3   fluticasone-salmeterol (ADVAIR HFA) 115-21 MCG/ACT inhaler Inhale 2 puffs into the lungs 2 (two) times daily. 1 each 12   gabapentin (NEURONTIN) 100 MG capsule Take 1 capsule (100 mg total) by mouth 3 (three) times daily. 90 capsule 3   losartan (COZAAR) 50 MG tablet TAKE 1 TABLET BY MOUTH  DAILY 90 tablet 3   meloxicam (MOBIC) 15 MG tablet TAKE 1 TABLET BY MOUTH  DAILY AS NEEDED FOR PAIN 90 tablet 3   tadalafil (CIALIS) 5 MG tablet TAKE ONE TABLET BY MOUTH DAILY 90 tablet 2   TRULANCE 3 MG TABS TAKE 1 TABLET BY MOUTH  DAILY 90 tablet 3   ALPRAZolam (XANAX) 0.5 MG tablet TAKE 1 TABLET BY MOUTH EVERY NIGHT AT BEDTIME AS NEEDED FOR ANXIETY (Patient not taking: Reported on 09/15/2021) 20 tablet 0   loratadine (CLARITIN) 10 MG tablet Take 10 mg by mouth daily as needed for allergies. (Patient not taking: Reported on 09/15/2021)     predniSONE (DELTASONE) 20 MG tablet Take 2 tablets (40 mg total)  by mouth daily with breakfast. (Patient not taking: Reported on 09/15/2021) 10 tablet 0   No current facility-administered medications on file prior to visit.     ROS see history of present illness  Objective  Physical Exam Vitals:   09/15/21 1026  BP: 118/80  Pulse: 78  Temp: 98.1 F (36.7 C)  SpO2: 98%    BP Readings from Last 3 Encounters:  09/15/21 118/80  06/18/21 120/80  05/05/21 120/80   Wt Readings from Last 3 Encounters:  09/15/21 203 lb 3.2 oz (92.2 kg)  07/07/21 205 lb (93 kg)  06/18/21 205 lb 6.4 oz (93.2 kg)    Physical Exam Constitutional:      General: He is not in acute distress.    Appearance: He is not diaphoretic.  HENT:     Mouth/Throat:     Mouth: Mucous membranes are moist.     Pharynx: Oropharynx  is clear.  Cardiovascular:     Rate and Rhythm: Normal rate and regular rhythm.     Heart sounds: Normal heart sounds.  Pulmonary:     Effort: Pulmonary effort is normal.     Breath sounds: Normal breath sounds.  Skin:    General: Skin is warm and dry.  Neurological:     Mental Status: He is alert.    The 10-year ASCVD risk score (Arnett DK, et al., 2019) is: 15%   Values used to calculate the score:     Age: 77 years     Sex: Male     Is Non-Hispanic African American: No     Diabetic: No     Tobacco smoker: Yes     Systolic Blood Pressure: 426 mmHg     Is BP treated: Yes     HDL Cholesterol: 47.9 mg/dL     Total Cholesterol: 151 mg/dL   Assessment/Plan: Please see individual problem list.  Problem List Items Addressed This Visit     COPD (chronic obstructive pulmonary disease) (HCC) (Chronic)    Cough could be related to COPD.  Will try adding Incruse to his regimen.  He will continue Advair 2 puffs twice daily.      Relevant Medications   umeclidinium bromide (INCRUSE ELLIPTA) 62.5 MCG/ACT AEPB   GERD (gastroesophageal reflux disease) - Primary (Chronic)    Patient's cough may be related to reflux.  Will switch his Protonix over to Nexium 40 mg daily and see if that is beneficial.  If not improving he will need to see GI.      Relevant Medications   esomeprazole (NEXIUM) 40 MG capsule   Hyperlipidemia (Chronic)    Check lipid panel.  Continue Lipitor 20 mg daily.  Will order coronary artery calcium scan.      Relevant Orders   CT CARDIAC SCORING ([facility] LOCATIONS ONLY)   Comp Met ([facility])   Lipid panel   Aortic atherosclerosis ([employer])    Noted on prior CT imaging.  We will continue risk factor management.  We will check coronary calcium score.      Relevant Orders   CT CARDIAC SCORING ([facility] LOCATIONS ONLY)   Other Visit Diagnoses     Encounter for screening for coronary artery disease       Relevant Orders   CT CARDIAC SCORING ([facility] LOCATIONS ONLY)        Return in about 6 weeks (around 10/27/2021) for COPD/GERD.   Tyler Rumps, MD [place]

## 2021-09-15 NOTE — Assessment & Plan Note (Signed)
Cough could be related to COPD.  Will try adding Incruse to his regimen.  He will continue Advair 2 puffs twice daily.

## 2021-09-15 NOTE — Assessment & Plan Note (Signed)
Patient's cough may be related to reflux.  Will switch his Protonix over to Nexium 40 mg daily and see if that is beneficial.  If not improving he will need to see GI.

## 2021-09-15 NOTE — Assessment & Plan Note (Signed)
Noted on prior CT imaging.  We will continue risk factor management.  We will check coronary calcium score.

## 2021-09-17 ENCOUNTER — Telehealth: Payer: Self-pay | Admitting: Family Medicine

## 2021-09-17 ENCOUNTER — Other Ambulatory Visit: Payer: Self-pay | Admitting: Family Medicine

## 2021-09-17 DIAGNOSIS — G8929 Other chronic pain: Secondary | ICD-10-CM

## 2021-09-17 NOTE — Telephone Encounter (Signed)
Patient is calling stating that [facility] needs PA for the inhaler before they can fill the order.   Patient also wanted to let you that the insurance is not going to cover Nexium.

## 2021-09-20 ENCOUNTER — Other Ambulatory Visit: Payer: Self-pay

## 2021-09-20 DIAGNOSIS — J449 Chronic obstructive pulmonary disease, unspecified: Secondary | ICD-10-CM

## 2021-09-20 MED ORDER — ALBUTEROL SULFATE HFA 108 (90 BASE) MCG/ACT IN AERS: INHALATION_SPRAY | RESPIRATORY_TRACT | 1 refills | Status: DC

## 2021-09-20 NOTE — Telephone Encounter (Signed)
Patient called, he has a question about the PA and he would like to receive a call back.

## 2021-09-20 NOTE — Telephone Encounter (Signed)
I started a PA on the medication Incruse Ellipta and I am awaiting a response from the insurance.  Dejae Bernet,cma

## 2021-09-21 NOTE — Telephone Encounter (Signed)
I called the patient and he stated he needed a PA on the Incruse Ellipta inhaler and I did that this morning and I am awaiting a response. Hasan Douse,cma

## 2021-09-22 ENCOUNTER — Other Ambulatory Visit: Payer: Self-pay

## 2021-09-22 ENCOUNTER — Other Ambulatory Visit: Payer: Self-pay | Admitting: Family Medicine

## 2021-09-22 ENCOUNTER — Telehealth: Payer: Self-pay | Admitting: Family Medicine

## 2021-09-22 ENCOUNTER — Ambulatory Visit
Admission: RE | Admit: 2021-09-22 | Discharge: 2021-09-22 | Disposition: A | Discharge status: Home / Self Care | Payer: Medicare Other | Source: Ambulatory Visit | Attending: Family Medicine | Admitting: Family Medicine

## 2021-09-22 DIAGNOSIS — I700 Atherosclerosis of aorta: Secondary | ICD-10-CM | POA: Insufficient documentation

## 2021-09-22 DIAGNOSIS — Z136 Encounter for screening for cardiovascular disorders: Secondary | ICD-10-CM | POA: Insufficient documentation

## 2021-09-22 DIAGNOSIS — E785 Hyperlipidemia, unspecified: Secondary | ICD-10-CM | POA: Insufficient documentation

## 2021-09-22 DIAGNOSIS — E782 Mixed hyperlipidemia: Secondary | ICD-10-CM

## 2021-09-22 MED ORDER — SPIRIVA HANDIHALER 18 MCG IN CAPS: 18.0000 ug | ORAL_CAPSULE | Freq: Every day | RESPIRATORY_TRACT | 12 refills | Status: DC

## 2021-09-22 MED ORDER — ATORVASTATIN CALCIUM 40 MG PO TABS: 40.0000 mg | ORAL_TABLET | Freq: Every morning | ORAL | 3 refills | Status: DC

## 2021-09-22 NOTE — Telephone Encounter (Signed)
Patient called about getting his CT Scan results.

## 2021-09-22 NOTE — Telephone Encounter (Signed)
See results note.  Mickayla Trouten,cma

## 2021-10-05 ENCOUNTER — Ambulatory Visit: Payer: Self-pay

## 2021-10-05 NOTE — Patient Instructions (Signed)
Visit Information  Thank you for taking time to visit with me today. Please don't hesitate to contact me if I can be of assistance to you.   Following are the goals we discussed today:   Goals Addressed             This Visit's Progress    COMPLETED: Care coordination activities- No further follow up required       Care Coordination Interventions: Care coordination program/ services discussed with patient Annual wellness visit discussed with patient. ( Completed 07/07/21)           If you are experiencing a Mental Health or [place] or need someone to talk to, please call 1-800-273-TALK (toll free, 24 hour hotline)  Patient verbalizes understanding of instructions and care plan provided today and agrees to view in [place]. Active MyChart status and patient understanding of how to access instructions and care plan via MyChart confirmed with patient.     Quinn Plowman RN,BSN,CCM RN Care Manager Coordinator [phone]

## 2021-10-05 NOTE — Patient Outreach (Signed)
  Care Coordination   Initial Visit Note   10/05/2021 Name: Dolph Tavano. MRN: [id number] DOB: 01-Apr-1955  [name] is a 66 y.o. year old male who sees Sonnenberg, Angela Adam, MD for primary care. I spoke with  [name] by phone today.  What matters to the patients health and wellness today?  Patient states he does not have any needs at this time.  Verbalized appreciation of the call. States he does not have need for this services.     Goals Addressed             This Visit's Progress    COMPLETED: Care coordination activities- No further follow up required       Care Coordination Interventions: Care coordination program/ services discussed with patient Annual wellness visit discussed with patient. ( Completed 07/07/21)          SDOH assessments and interventions completed:  No     Care Coordination Interventions Activated:  Yes  Care Coordination Interventions:  Yes, provided   Follow up plan: No further intervention required.   Encounter Outcome:  Pt. Visit Completed   Quinn Plowman RN,BSN,CCM [place] Coordinator [phone]

## 2021-10-20 ENCOUNTER — Other Ambulatory Visit: Payer: Self-pay | Admitting: Family Medicine

## 2021-10-20 DIAGNOSIS — G8929 Other chronic pain: Secondary | ICD-10-CM

## 2021-10-20 DIAGNOSIS — M5412 Radiculopathy, cervical region: Secondary | ICD-10-CM | POA: Diagnosis not present

## 2021-10-20 DIAGNOSIS — M47812 Spondylosis without myelopathy or radiculopathy, cervical region: Secondary | ICD-10-CM | POA: Diagnosis not present

## 2021-10-20 DIAGNOSIS — M5030 Other cervical disc degeneration, unspecified cervical region: Secondary | ICD-10-CM | POA: Diagnosis not present

## 2021-10-20 DIAGNOSIS — M4802 Spinal stenosis, cervical region: Secondary | ICD-10-CM | POA: Diagnosis not present

## 2021-11-02 ENCOUNTER — Other Ambulatory Visit (INDEPENDENT_AMBULATORY_CARE_PROVIDER_SITE_OTHER): Payer: Medicare Other

## 2021-11-02 DIAGNOSIS — E782 Mixed hyperlipidemia: Secondary | ICD-10-CM

## 2021-11-02 LAB — HEPATIC FUNCTION PANEL
ALT: 21 U/L (ref 0–53)
AST: 19 U/L (ref 0–37)
Albumin: 4 g/dL (ref 3.5–5.2)
Alkaline Phosphatase: 50 U/L (ref 39–117)
Bilirubin, Direct: 0.3 mg/dL (ref 0.0–0.3)
Total Bilirubin: 1.6 mg/dL — ABNORMAL HIGH (ref 0.2–1.2)
Total Protein: 6.5 g/dL (ref 6.0–8.3)

## 2021-11-02 LAB — LDL CHOLESTEROL, DIRECT: Direct LDL: 83 mg/dL

## 2021-11-12 ENCOUNTER — Encounter: Payer: Self-pay | Admitting: Family Medicine

## 2021-11-12 ENCOUNTER — Ambulatory Visit (INDEPENDENT_AMBULATORY_CARE_PROVIDER_SITE_OTHER): Payer: Medicare Other | Admitting: Family Medicine

## 2021-11-12 DIAGNOSIS — J449 Chronic obstructive pulmonary disease, unspecified: Secondary | ICD-10-CM

## 2021-11-12 DIAGNOSIS — I10 Essential (primary) hypertension: Secondary | ICD-10-CM | POA: Diagnosis not present

## 2021-11-12 DIAGNOSIS — L719 Rosacea, unspecified: Secondary | ICD-10-CM

## 2021-11-12 DIAGNOSIS — E785 Hyperlipidemia, unspecified: Secondary | ICD-10-CM

## 2021-11-12 NOTE — Assessment & Plan Note (Signed)
Discussed increasing his Lipitor to 80 mg daily to get his LDL to less than 70.  The patient is hesitant to do this.  We subsequently discussed reducing fatty food intake and sugary food intake and rechecking in 3 months.  For now he will continue Lipitor 40 mg daily.

## 2021-11-12 NOTE — Progress Notes (Signed)
Tyler Rumps, MD Phone: [phone]  [name]. is a 66 y.o. male who presents today for f/u.  HYPERLIPIDEMIA Symptoms Chest pain on exertion:  no   Medications: Compliance- taking lipitor Right upper quadrant pain- no  Muscle aches- no Lipid Panel     Component Value Date/Time   CHOL 156 09/15/2021 1101   TRIG 72.0 09/15/2021 1101   HDL 51.70 09/15/2021 1101   CHOLHDL 3 09/15/2021 1101   VLDL 14.4 09/15/2021 1101   LDLCALC 90 09/15/2021 1101   LDLDIRECT 83.0 11/02/2021 0732   HYPERTENSION Disease Monitoring Home BP Monitoring similar to today or lower Chest pain- no    Dyspnea- chronic and stable related to COPD Medications Compliance-  taking losartan.  Edema- no BMET    Component Value Date/Time   NA 138 09/15/2021 1101   NA 144 02/15/2016 0741   K 4.2 09/15/2021 1101   CL 102 09/15/2021 1101   CO2 30 09/15/2021 1101   GLUCOSE 92 09/15/2021 1101   BUN 16 09/15/2021 1101   BUN 15 02/15/2016 0741   CREATININE 0.87 09/15/2021 1101   CALCIUM 9.8 09/15/2021 1101   GFRNONAA >60 08/20/2020 0117   GFRAA >60 02/22/2017 1515   COPD: Medication compliance- taking adviar as prescribed, singulair was not helpful  Dyspnea- chronic and stable  Wheezing- chronic and stable  Cough- chronic and stable   Notes he is back to his baseline.  Rosacea: Patient notes doxycycline is helpful with this.  He does get flareups of this issue if he goes out in the sun.   Social History   Tobacco Use  Smoking Status Former   Packs/day: 1.00   Years: 20.00   Total pack years: 20.00   Types: Cigarettes   Quit date: 02/08/1991   Years since quitting: 30.7  Smokeless Tobacco Never    Current Outpatient Medications on File Prior to Visit  Medication Sig Dispense Refill   albuterol (VENTOLIN HFA) 108 (90 Base) MCG/ACT inhaler TAKE 2 PUFFS BY MOUTH EVERY 6 HOURS AS NEEDED FOR WHEEZE OR SHORTNESS OF BREATH 8.5 each 1   ALPRAZolam (XANAX) 0.5 MG tablet TAKE 1 TABLET BY MOUTH  EVERY NIGHT AT BEDTIME AS NEEDED FOR ANXIETY 20 tablet 0   atorvastatin (LIPITOR) 40 MG tablet Take 1 tablet (40 mg total) by mouth every morning. 90 tablet 3   Azelaic Acid 15 % gel Apply 1 application. topically 2 (two) times daily. After skin is thoroughly washed and patted dry, gently but thoroughly massage a thin film of azelaic acid cream into the affected area twice daily, in the morning and evening. 50 g 2   cyclobenzaprine (FLEXERIL) 10 MG tablet TAKE 1 TABLET BY MOUTH 3 TIMES  DAILY AS NEEDED FOR MUSCLE  SPASM(S) 90 tablet 0   doxycycline (VIBRAMYCIN) 50 MG capsule Take 1 capsule (50 mg total) by mouth 2 (two) times daily. 180 capsule 1   esomeprazole (NEXIUM) 40 MG capsule Take 1 capsule (40 mg total) by mouth daily before breakfast. 30 capsule 3   finasteride (PROSCAR) 5 MG tablet Take 1 tablet (5 mg total) by mouth daily. 90 tablet 3   fluticasone-salmeterol (ADVAIR HFA) 115-21 MCG/ACT inhaler Inhale 2 puffs into the lungs 2 (two) times daily. 1 each 12   gabapentin (NEURONTIN) 100 MG capsule TAKE 1 CAPSULE BY MOUTH 3 TIMES  DAILY 270 capsule 3   losartan (COZAAR) 50 MG tablet TAKE 1 TABLET BY MOUTH  DAILY 90 tablet 3   meloxicam (MOBIC) 15  MG tablet TAKE 1 TABLET BY MOUTH  DAILY AS NEEDED FOR PAIN 90 tablet 3   tadalafil (CIALIS) 5 MG tablet TAKE ONE TABLET BY MOUTH DAILY 90 tablet 2   tiotropium (SPIRIVA HANDIHALER) 18 MCG inhalation capsule Place 1 capsule (18 mcg total) into inhaler and inhale daily. 30 capsule 12   TRULANCE 3 MG TABS TAKE 1 TABLET BY MOUTH  DAILY 90 tablet 3   No current facility-administered medications on file prior to visit.     ROS see history of present illness  Objective  Physical Exam Vitals:   11/12/21 0927  BP: 118/78  Pulse: 67  Temp: 97.9 F (36.6 C)  SpO2: 98%    BP Readings from Last 3 Encounters:  11/12/21 118/78  09/15/21 118/80  06/18/21 120/80   Wt Readings from Last 3 Encounters:  11/12/21 207 lb 1.6 oz (93.9 kg)  09/15/21  203 lb 3.2 oz (92.2 kg)  07/07/21 205 lb (93 kg)    Physical Exam Constitutional:      General: He is not in acute distress.    Appearance: He is not diaphoretic.  Cardiovascular:     Rate and Rhythm: Normal rate and regular rhythm.     Heart sounds: Normal heart sounds.  Pulmonary:     Effort: Pulmonary effort is normal.     Breath sounds: Normal breath sounds.  Musculoskeletal:     Comments: Mild erythema both cheeks with telangiectasias  Skin:    General: Skin is warm and dry.  Neurological:     Mental Status: He is alert.      Assessment/Plan: Please see individual problem list.  Problem List Items Addressed This Visit     COPD (chronic obstructive pulmonary disease) ([place]) (Chronic)    Patient is back to his baseline.  He will continue Advair 2 puffs twice daily.      Hyperlipidemia (Chronic)    Discussed increasing his Lipitor to 80 mg daily to get his LDL to less than 70.  The patient is hesitant to do this.  We subsequently discussed reducing fatty food intake and sugary food intake and rechecking in 3 months.  For now he will continue Lipitor 40 mg daily.      Relevant Orders   LDL cholesterol, direct   Hypertension (Chronic)    Adequately controlled.  He will continue losartan 50 mg daily.      Rosacea (Chronic)    Has responded well to doxycycline 50 mg twice daily.  He will continue this.  Discussed the risk of skin sensitivity to the sun on this medication.      Health maintenance: Patient reports he got his pneumonia vaccineat the pharmacy.  CMA will contact the pharmacy to get the dates on this.  He was encouraged to get the RSV vaccine at the pharmacy.  Return in about 3 months (around 02/12/2022) for Hyperlipidemia, labs 2 days prior.   Tyler Rumps, MD [place]

## 2021-11-12 NOTE — Assessment & Plan Note (Signed)
Has responded well to doxycycline 50 mg twice daily.  He will continue this.  Discussed the risk of skin sensitivity to the sun on this medication.

## 2021-11-12 NOTE — Assessment & Plan Note (Signed)
Adequately controlled.  He will continue losartan 50 mg daily.

## 2021-11-12 NOTE — Assessment & Plan Note (Signed)
Patient is back to his baseline.  He will continue Advair 2 puffs twice daily.

## 2021-11-12 NOTE — Patient Instructions (Signed)
Nice to see you. Please work on reducing any fatty food or sugary food intake.

## 2021-11-30 DIAGNOSIS — M5412 Radiculopathy, cervical region: Secondary | ICD-10-CM | POA: Diagnosis not present

## 2021-11-30 DIAGNOSIS — M5030 Other cervical disc degeneration, unspecified cervical region: Secondary | ICD-10-CM | POA: Diagnosis not present

## 2021-12-22 ENCOUNTER — Ambulatory Visit (INDEPENDENT_AMBULATORY_CARE_PROVIDER_SITE_OTHER): Payer: Medicare Other | Admitting: Dermatology

## 2021-12-22 DIAGNOSIS — Z79899 Other long term (current) drug therapy: Secondary | ICD-10-CM

## 2021-12-22 DIAGNOSIS — L719 Rosacea, unspecified: Secondary | ICD-10-CM

## 2021-12-22 NOTE — Patient Instructions (Addendum)
Rosacea  What is rosacea? Rosacea (say: ro-zay-sha) is a common skin disease that usually begins as a trend of flushing or blushing easily.  As rosacea progresses, a persistent redness in the center of the face will develop and may gradually spread beyond the nose and cheeks to the forehead and chin.  In some cases, the ears, chest, and back could be affected.  Rosacea may appear as tiny blood vessels or small red bumps that occur in crops.  Frequently they can contain pus, and are called "pustules".  If the bumps do not contain pus, they are referred to as "papules".  Rarely, in prolonged, untreated cases of rosacea, the oil glands of the nose and cheeks may become permanently enlarged.  This is called rhinophyma, and is seen more frequently in men.  Signs and Risks In its beginning stages, rosacea tends to come and go, which makes it difficult to recognize.  It can start as intermittent flushing of the face.  Eventually, blood vessels may become permanently visible.  Pustules and papules can appear, but can be mistaken for adult acne.  People of all races, ages, genders and ethnic groups are at risk of developing rosacea.  However, it is more common in women (especially around menopause) and adults with fair skin between the ages of 3 and 87.  Treatment Dermatologists typically recommend a combination of treatments to effectively manage rosacea.  Treatment can improve symptoms and may stop the progression of the rosacea.  Treatment may involve both topical and oral medications.  The tetracycline antibiotics are often used for their anti-inflammatory effect; however, because of the possibility of developing antibiotic resistance, they should not be used long term at full dose.  For dilated blood vessels the options include electrodessication (uses electric current through a small needle), laser treatment, and cosmetics to hide the redness.   With all forms of treatment, improvement is a slow process, and  patients may not see any results for the first 3-4 weeks.  It is very important to avoid the sun and other triggers.  Patients must wear sunscreen daily.  Skin Care Instructions: Cleanse the skin with a mild soap such as CeraVe cleanser, Cetaphil cleanser, or Dove soap once or twice daily as needed. Moisturize with Eucerin Redness Relief Daily Perfecting Lotion (has a subtle green tint), CeraVe Moisturizing Cream, or Oil of Olay Daily Moisturizer with sunscreen every morning and/or night as recommended. Makeup should be "non-comedogenic" (won't clog pores) and be labeled "for sensitive skin". Good choices for cosmetics are: Neutrogena, Almay, and Physician's Formula.  Any product with a green tint tends to offset a red complexion. If your eyes are dry and irritated, use artificial tears 2-3 times per day and cleanse the eyelids daily with baby shampoo.  Have your eyes examined at least every 2 years.  Be sure to tell your eye doctor that you have rosacea. Alcoholic beverages tend to cause flushing of the skin, and may make rosacea worse. Always wear sunscreen, protect your skin from extreme hot and cold temperatures, and avoid spicy foods, hot drinks, and mechanical irritation such as rubbing, scrubbing, or massaging the face.  Avoid harsh skin cleansers, cleansing masks, astringents, and exfoliation. If a particular product burns or makes your face feel tight, then it is likely to flare your rosacea. If you are having difficulty finding a sunscreen that you can tolerate, you may try switching to a chemical-free sunscreen.  These are ones whose active ingredient is zinc oxide or titanium dioxide  only.  They should also be fragrance free, non-comedogenic, and labeled for sensitive skin. Rosacea triggers may vary from person to person.  There are a variety of foods that have been reported to trigger rosacea.  Some patients find that keeping a diary of what they were doing when they flared helps them avoid  triggers.   Mild cleanser like Cerave or Cetaphil Recommend moisturizer with SPF Cerave or Cetaphil     Due to recent changes in healthcare laws, you may see results of your pathology and/or laboratory studies on MyChart before the doctors have had a chance to review them. We understand that in some cases there may be results that are confusing or concerning to you. Please understand that not all results are received at the same time and often the doctors may need to interpret multiple results in order to provide you with the best plan of care or course of treatment. Therefore, we ask that you please give [place] 2 business days to thoroughly review all your results before contacting the office for clarification. Should we see a critical lab result, you will be contacted sooner.   If You Need Anything After Your Visit  If you have any questions or concerns for your doctor, please call our main line at [phone] and press option 4 to reach your doctor's medical assistant. If no one answers, please leave a voicemail as directed and we will return your call as soon as possible. Messages left after 4 pm will be answered the following business day.   You may also send [place] a message via [place]. We typically respond to MyChart messages within 1-2 business days.  For prescription refills, please ask your pharmacy to contact our office. Our fax number is [phone].  If you have an urgent issue when the clinic is closed that cannot wait until the next business day, you can page your doctor at the number below.    Please note that while we do our best to be available for urgent issues outside of office hours, we are not available 24/7.   If you have an urgent issue and are unable to reach [place], you may choose to seek medical care at your doctor's office, retail clinic, urgent care center, or emergency room.  If you have a medical emergency, please immediately call 911 or go to the emergency  department.  Pager Numbers  - Dr. Nehemiah Massed: [phone]  - Dr. Laurence Ferrari: [phone]  - Dr. Nicole Kindred: [phone]  In the event of inclement weather, please call our main line at [phone] for an update on the status of any delays or closures.  Dermatology Medication Tips: Please keep the boxes that topical medications come in in order to help keep track of the instructions about where and how to use these. Pharmacies typically print the medication instructions only on the boxes and not directly on the medication tubes.   If your medication is too expensive, please contact our office at [phone] option 4 or send [place] a message through [place].   We are unable to tell what your co-pay for medications will be in advance as this is different depending on your insurance coverage. However, we may be able to find a substitute medication at lower cost or fill out paperwork to get insurance to cover a needed medication.   If a prior authorization is required to get your medication covered by your insurance company, please allow [place] 1-2 business days to complete this process.  Drug prices often  vary depending on where the prescription is filled and some pharmacies may offer cheaper prices.  The website www.goodrx.com contains coupons for medications through different pharmacies. The prices here do not account for what the cost may be with help from insurance (it may be cheaper with your insurance), but the website can give you the price if you did not use any insurance.  - You can print the associated coupon and take it with your prescription to the pharmacy.  - You may also stop by our office during regular business hours and pick up a GoodRx coupon card.  - If you need your prescription sent electronically to a different pharmacy, notify our office through [facility] or by phone at [phone] option 4.     Si Usted Necesita Algo Despus de Su Visita  Tambin puede enviarnos un  mensaje a travs de Pharmacist, community. Por lo general respondemos a los mensajes de MyChart en el transcurso de 1 a 2 das hbiles.  Para renovar recetas, por favor pida a su farmacia que se ponga en contacto con nuestra oficina. Harland Dingwall de fax es [place] [phone].  Si tiene un asunto urgente cuando la clnica est cerrada y que no puede esperar hasta el siguiente da hbil, puede llamar/localizar a su doctor(a) al nmero que aparece a continuacin.   Por favor, tenga en cuenta que aunque hacemos todo lo posible para estar disponibles para asuntos urgentes fuera del horario de [place], no estamos disponibles las 24 horas del da, los 7 das de la [place].   Si tiene un problema urgente y no puede comunicarse con nosotros, puede optar por buscar atencin mdica  en el consultorio de su doctor(a), en una clnica privada, en un centro de atencin urgente o en una sala de emergencias.  Si tiene Engineering geologist, por favor llame inmediatamente al 911 o vaya a la sala de emergencias.  Nmeros de bper  - Dr. Nehemiah Massed: [phone]  - Dra. Moye: [phone]  - Dra. Nicole Kindred: [phone]  En caso de inclemencias del [place], por favor llame a Johnsie Kindred principal al [phone] para una actualizacin sobre el [place] de cualquier retraso o cierre.  Consejos para la medicacin en dermatologa: Por favor, guarde las cajas en las que vienen los medicamentos de uso tpico para ayudarle a seguir las instrucciones sobre dnde y cmo usarlos. Las farmacias generalmente imprimen las instrucciones del medicamento slo en las cajas y no directamente en los tubos del [place].   Si su medicamento es muy caro, por favor, pngase en contacto con Zigmund Daniel llamando al [phone] y presione la opcin 4 o envenos un mensaje a travs de Pharmacist, community.   No podemos decirle cul ser su copago por los medicamentos por adelantado ya que esto es diferente dependiendo de la cobertura de su seguro. Sin embargo,  es posible que podamos encontrar un medicamento sustituto a Electrical engineer un formulario para que el seguro cubra el medicamento que se considera necesario.   Si se requiere una autorizacin previa para que su compaa de seguros [place] su medicamento, por favor permtanos de 1 a 2 das hbiles para completar este proceso.  Los precios de los medicamentos varan con frecuencia dependiendo del Environmental consultant de dnde se surte la receta y alguna farmacias pueden ofrecer precios ms baratos.  El sitio web www.goodrx.com tiene cupones para medicamentos de Airline pilot. Los precios aqu no tienen en cuenta lo que podra costar con la ayuda del seguro (puede ser ms barato con su  pero el sitio web puede darle el precio si no utiliz ningn seguro.  - Puede imprimir el cupn correspondiente y llevarlo con su receta a la farmacia.  - Tambin puede pasar por nuestra oficina durante el horario de atencin regular y recoger una tarjeta de cupones de GoodRx.  - Si necesita que su receta se enve electrnicamente a una farmacia diferente, informe a nuestra oficina a travs de MyChart de [place] o por telfono llamando al [phone] y presione la opcin 4.  

## 2021-12-22 NOTE — Progress Notes (Signed)
   New Patient Visit  Subjective  [name]. is a 66 y.o. male who presents for the following: Rosacea (Face, Doxycycline '50mg'$  1 po bid for 4-71m pt d/x last week, Azeliac acid gel in past in past).  New patient referral from Dr. ETommi Rumps  The following portions of the chart were reviewed this encounter and updated as appropriate:   Tobacco  Allergies  Meds  Problems  Med Hx  Surg Hx  Fam Hx     Review of Systems:  No other skin or systemic complaints except as noted in HPI or Assessment and Plan.  Objective  Well appearing patient in no apparent distress; mood and affect are within normal limits.  A focused examination was performed including face. Relevant physical exam findings are noted in the Assessment and Plan.  face Erythema mid face   Assessment & Plan  Rosacea face Chronic and persistent condition with duration or expected duration over one year. Condition is symptomatic / bothersome to patient. Not to goal.  Rosacea is a chronic progressive skin condition usually affecting the face of adults, causing redness and/or acne bumps. It is treatable but not curable. It sometimes affects the eyes (ocular rosacea) as well. It may respond to topical and/or systemic medication and can flare with stress, sun exposure, alcohol, exercise, topical steroids (including hydrocortisone/cortisone 10) and some foods.  Daily application of broad spectrum spf 30+ sunscreen to face is recommended to reduce flares.  Discussed the treatment option of BBL/laser.  Typically we recommend 1-3 treatment sessions about 5-8 weeks apart for best results.  The patient's condition may require "maintenance treatments" in the future.  The fee for BBL / laser treatments is $350 per treatment session for the whole face.  A fee can be quoted for other parts of the body. Insurance typically does not pay for BBL/laser treatments and therefore the fee is an out-of-pocket cost.  D/C Doxycycline  since pt prefers not to take  Discussed Skin Medicinals Triple Cream Discussed Rhofade/Mirvaso  Recommend mild cleanser and moisturizer with spf  Related Medications Azelaic Acid 15 % gel Apply 1 application. topically 2 (two) times daily. After skin is thoroughly washed and patted dry, gently but thoroughly massage a thin film of azelaic acid cream into the affected area twice daily, in the morning and evening.  doxycycline (VIBRAMYCIN) 50 MG capsule Take 1 capsule (50 mg total) by mouth 2 (two) times daily.   Return if symptoms worsen or fail to improve.  I, SOthelia Pulling RMA, am acting as scribe for DSarina Ser MD . Documentation: I have reviewed the above documentation for accuracy and completeness, and I agree with the above.  DSarina Ser MD

## 2022-01-06 ENCOUNTER — Encounter: Payer: Self-pay | Admitting: Dermatology

## 2022-01-07 ENCOUNTER — Ambulatory Visit
Admission: EM | Admit: 2022-01-07 | Discharge: 2022-01-07 | Disposition: A | Discharge status: Home / Self Care | Payer: 59 | Attending: Emergency Medicine | Admitting: Emergency Medicine

## 2022-01-07 ENCOUNTER — Telehealth: Payer: Self-pay

## 2022-01-07 ENCOUNTER — Ambulatory Visit: Payer: Self-pay | Admitting: Nurse Practitioner

## 2022-01-07 DIAGNOSIS — J441 Chronic obstructive pulmonary disease with (acute) exacerbation: Secondary | ICD-10-CM | POA: Diagnosis not present

## 2022-01-07 DIAGNOSIS — B349 Viral infection, unspecified: Secondary | ICD-10-CM | POA: Diagnosis not present

## 2022-01-07 MED ORDER — PREDNISONE 10 MG PO TABS: 40.0000 mg | ORAL_TABLET | Freq: Every day | ORAL | 0 refills | Status: AC

## 2022-01-07 MED ORDER — ALBUTEROL SULFATE HFA 108 (90 BASE) MCG/ACT IN AERS: 1.0000 | INHALATION_SPRAY | Freq: Four times a day (QID) | RESPIRATORY_TRACT | 0 refills | Status: DC | PRN

## 2022-01-07 NOTE — Telephone Encounter (Signed)
Access Nurse called back and states she recommends patient be seen in 3-4 hours.  We do not have any available appointments today and neither does [facility].  I spoke with patient and asked if he is willing to go to one of our [place] locations, and he said "no".  I let him know that Access Nurse stated that he should be seen in 3-4 hours, so that would leave Urgent Care and the Emergency Room as options.  Patient states he does not want to go there and wait a long time.  I let patient know that we can get him in on Monday with Mable Paris, FNP.  Patient asked about an appointment with Dr. [name].  I let patient know the first available with Dr. Caryl Bis will be later next week.  I transferred call to Jeralyn Bennett, [place].

## 2022-01-07 NOTE — Discharge Instructions (Addendum)
Use the albuterol inhaler and take the prednisone as directed.  Follow up with your primary care provider.

## 2022-01-07 NOTE — Telephone Encounter (Signed)
I spoke to the Patient . Patient's wife was just diagnosed with RSV. Patient is suppose to check his SpO2 when him and his wife get home and call us back. If the Patient's SpO2 is 90 or above then Dr. Caryl Bis stated to put him in for today at 3:15 but if the SpO2 is below 90 to advise to go to the Walk In at [facility] or UC.

## 2022-01-07 NOTE — ED Provider Notes (Signed)
[name]    CSN: [id number] Arrival date & time: 01/07/22  0857      History   Chief Complaint Chief Complaint  Patient presents with   Cough   Headache    HPI [name]. is a 66 y.o. male.  Patient presents with 4 day history of headache, fatigue, cough which is occasionally productive of yellow phlegm, mild shortness of breath, nausea.  Treatment at home with Advil; last taken yesterday.  No fever, sore throat, chest pain, vomiting, diarrhea, or other symptoms.  Patient reports his wife has RSV.  Patient had his RSV vaccine on 01/01/2022.  His medical history includes COPD, hypertension, chronic pain syndrome.    The history is provided by the patient and medical records.    Past Medical History:  Diagnosis Date   Anxiety    Arthritis    BPH (benign prostatic hyperplasia) 11/19/2013   Bulge of lumbar disc without myelopathy    L2/3 through L5/6   Bulging disc    C2/3, C3/4, C6/7   Cervical disc herniation    C4/5 and C5/6   Colon polyp 04/18/2011   Constipation 08/31/2012   Depression    Diverticulosis    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/2012   GERD (gastroesophageal reflux disease)    H/O diverticulitis of colon    Hemorrhoid    Hiatal hernia    Hyperlipidemia    Levoscoliosis    Sleep difficulties    Spinal stenosis in cervical region    cord abutment C4/5   Testicular pain, left    Typical atrial flutter ([place]) 11/2015   a. CHADS2VASc => 0; b. on Eliquis for pending ablation     Patient Active Problem List   Diagnosis Date Noted   Rosacea 11/12/2021   COPD (chronic obstructive pulmonary disease) ([place]) 09/15/2021   Aortic atherosclerosis ([place]) 09/15/2021   T9 vertebral fracture, sequela 03/25/2021   Levoscoliosis of thoracic spine 02/25/2021   Chronic midline thoracic back pain 02/25/2021   Claustrophobia 02/04/2021   Thoracic compression fracture, sequela (T1 & T2) 02/04/2021   Cervicalgia 01/19/2021   Spondylosis  without myelopathy or radiculopathy, cervical region 01/19/2021   Long term prescription benzodiazepine use 01/04/2021   Chronic neck pain (1ry area of Pain) (Bilateral) (L>R) 01/04/2021   Cervicogenic headache (3ry area of Pain) (Bilateral) 01/04/2021   Lumbosacral radiculopathy at S1 (Left) 01/04/2021   Chronic upper back pain (6th area of Pain) 01/04/2021   Cervical facet syndrome (Bilateral) 01/04/2021   Abnormal MRI, lumbar spine (11/18/2017) 01/04/2021   DDD (degenerative disc disease), lumbar 01/04/2021   Lumbar facet arthropathy (Multilevel) (Bilateral) 01/04/2021   Lumbar facet syndrome 01/04/2021   Abnormal MRI, cervical spine (02/17/2021) 01/04/2021   DDD (degenerative disc disease), cervical 01/04/2021   Cervical facet hypertrophy 01/04/2021   Grade 1 Cervical Retrolisthesis of C4/C5 and C5/C6 01/04/2021   Chronic pain syndrome 01/01/2021   Pharmacologic therapy 01/01/2021   Disorder of skeletal system 01/01/2021   Problems influencing health status 01/01/2021   Skin cyst 06/03/2020   Epididymal cyst 82/99/3716   History of Helicobacter pylori infection 05/17/2019   OSA (obstructive sleep apnea) 04/10/2019   GERD (gastroesophageal reflux disease) 01/16/2018   Sleep apnea 01/16/2018   Chronic upper extremity pain (2ry area of Pain) (Bilateral) (L>R) 12/13/2017   Chronic lower extremity pain (5th area of Pain) (Bilateral) (L>R) 12/13/2017   Chronic cervical radiculopathy (C7, C8) (Left) 01/23/2017   Hypertension 01/23/2017   Chronic low back pain  with left-sided sciatica 03/30/2016   History of atrial flutter    Elevated PSA 12/24/2015   Dyspnea    Adjustment disorder with mixed anxiety and depressed mood 07/23/2015   OAB (overactive bladder) 03/31/2015   BPH (benign prostatic hyperplasia) 11/19/2013   Hyperlipidemia 11/19/2013   Cervical foraminal stenosis (C4-5, C5-6) (Left) 03/08/2013   Lipoma of back 03/08/2013   Benign prostatic hyperplasia with urinary  obstruction 08/23/2012   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/2012   Colon polyp 04/18/2011    Past Surgical History:  Procedure Laterality Date   ATRIAL FLUTTER ABLATION  02/17/2016   COLONOSCOPY  2014   COLONOSCOPY WITH PROPOFOL N/A 06/30/2016   Procedure: COLONOSCOPY WITH PROPOFOL;  Surgeon: Lucilla Lame, MD;  Location: [facility];  Service: Endoscopy;  Laterality: N/A;   ELECTROPHYSIOLOGIC STUDY N/A 01/04/2016   Procedure: Cardioversion;  Surgeon: Wellington Hampshire, MD;  Location: [facility];  Service: Cardiovascular;  Laterality: N/A;   ELECTROPHYSIOLOGIC STUDY N/A 02/17/2016   Procedure: A-Flutter Ablation;  Surgeon: Deboraha Sprang, MD;  Location: [facility];  Service: Cardiovascular;  Laterality: N/A;   GANGLION CYST EXCISION Right 1994   wrist & back   HERNIA REPAIR Right 1994   abdominal repair with mesh   NASAL SEPTUM SURGERY  2011   Dr. [name]        Home Medications    Prior to Admission medications   Medication Sig Start Date End Date Taking? Authorizing Provider  albuterol (VENTOLIN HFA) 108 (90 Base) MCG/ACT inhaler Inhale 1-2 puffs into the lungs every 6 (six) hours as needed. 01/07/22  Yes Sharion Balloon, NP  predniSONE (DELTASONE) 10 MG tablet Take 4 tablets (40 mg total) by mouth daily for 5 days. 01/07/22 01/12/22 Yes Sharion Balloon, NP  ALPRAZolam Duanne Moron) 0.5 MG tablet TAKE 1 TABLET BY MOUTH EVERY NIGHT AT BEDTIME AS NEEDED FOR ANXIETY 03/19/21   Dutch Quint B, FNP  atorvastatin (LIPITOR) 40 MG tablet Take 1 tablet (40 mg total) by mouth every morning. 09/22/21   [name], MD  Azelaic Acid 15 % gel Apply 1 application. topically 2 (two) times daily. After skin is thoroughly washed and patted dry, gently but thoroughly massage a thin film of azelaic acid cream into the affected area twice daily, in the morning and evening. 06/18/21   McLean-Scocuzza, Nino Glow, MD  cyclobenzaprine (FLEXERIL) 10 MG tablet TAKE 1 TABLET BY MOUTH 3 TIMES   DAILY AS NEEDED FOR MUSCLE  SPASM(S) 10/20/21   Dutch Quint B, FNP  doxycycline (VIBRAMYCIN) 50 MG capsule Take 1 capsule (50 mg total) by mouth 2 (two) times daily. 08/13/21   [name], MD  esomeprazole (NEXIUM) 40 MG capsule Take 1 capsule (40 mg total) by mouth daily before breakfast. Patient not taking: Reported on 12/22/2021 09/15/21   [name], MD  finasteride (PROSCAR) 5 MG tablet Take 1 tablet (5 mg total) by mouth daily. 09/05/21 08/31/22  Zara Council A, [name]  fluticasone-salmeterol (ADVAIR HFA) 220-25 MCG/ACT inhaler Inhale 2 puffs into the lungs 2 (two) times daily. 11/30/20   Flora Lipps, MD  gabapentin (NEURONTIN) 100 MG capsule TAKE 1 CAPSULE BY MOUTH 3 TIMES  DAILY 09/17/21   [name], MD  losartan (COZAAR) 50 MG tablet TAKE 1 TABLET BY MOUTH  DAILY 07/25/21   Dutch Quint B, FNP  meloxicam (MOBIC) 15 MG tablet TAKE 1 TABLET BY MOUTH  DAILY AS NEEDED FOR PAIN 03/03/21   Justin Mend,  Padonda B, FNP  tadalafil (CIALIS) 5 MG tablet TAKE ONE TABLET BY MOUTH DAILY 07/19/21   Stoioff, Ronda Fairly, MD  tiotropium (SPIRIVA HANDIHALER) 18 MCG inhalation capsule Place 1 capsule (18 mcg total) into inhaler and inhale daily. Patient not taking: Reported on 12/22/2021 09/22/21   [name], MD  TRULANCE 3 MG TABS TAKE 1 TABLET BY MOUTH  DAILY 07/14/20   Lucilla Lame, MD    Family History Family History  Problem Relation Age of Onset   Hyperlipidemia Mother    Cancer Mother        skin cancer?   Heart disease Mother 73       MI - died in her sleep   Hyperlipidemia Father    Heart disease Father    Kidney disease Neg Hx    Prostate cancer Neg Hx    Kidney cancer Neg Hx    Bladder Cancer Neg Hx     Social History Social History   Tobacco Use   Smoking status: Former    Packs/day: 1.00    Years: 20.00    Total pack years: 20.00    Types: Cigarettes    Quit date: 02/08/1991    Years since quitting: 30.9   Smokeless tobacco: Never  Substance Use Topics    Alcohol use: No    Alcohol/week: 0.0 standard drinks of alcohol   Drug use: No     Allergies   Other   Review of Systems Review of Systems  Constitutional:  Positive for fatigue. Negative for chills and fever.  HENT:  Negative for ear pain and sore throat.   Respiratory:  Positive for cough and shortness of breath.   Cardiovascular:  Negative for chest pain and palpitations.  Gastrointestinal:  Positive for nausea. Negative for diarrhea and vomiting.  Skin:  Negative for color change and rash.  Neurological:  Positive for headaches.  All other systems reviewed and are negative.    Physical Exam Triage Vital Signs ED Triage Vitals  Enc Vitals Group     BP      Pulse      Resp      Temp      Temp src      SpO2      Weight      Height      Head Circumference      Peak Flow      Pain Score      Pain Loc      Pain Edu?      Excl. in [place]?    No data found.  Updated Vital Signs BP (!) 144/81   Pulse (!) 105   Temp 99.1 F (37.3 C)   Resp 18   Ht '5\' 10"'$  (1.778 m)   Wt 200 lb (90.7 kg)   SpO2 97%   BMI 28.70 kg/m   Visual Acuity Right Eye Distance:   Left Eye Distance:   Bilateral Distance:    Right Eye Near:   Left Eye Near:    Bilateral Near:     Physical Exam Vitals and nursing note reviewed.  Constitutional:      General: He is not in acute distress.    Appearance: Normal appearance. He is well-developed. He is not ill-appearing.  HENT:     Right Ear: Tympanic membrane normal.     Left Ear: Tympanic membrane normal.     Nose: Nose normal.     Mouth/Throat:     Mouth: Mucous membranes are  moist.     Pharynx: Oropharynx is clear.  Cardiovascular:     Rate and Rhythm: Normal rate and regular rhythm.     Heart sounds: Normal heart sounds.  Pulmonary:     Effort: Pulmonary effort is normal. No respiratory distress.     Breath sounds: Normal breath sounds. No wheezing, rhonchi or rales.  Musculoskeletal:     Cervical back: Neck supple.  Skin:     General: Skin is warm and dry.  Neurological:     Mental Status: He is alert.  Psychiatric:        Mood and Affect: Mood normal.        Behavior: Behavior normal.      UC Treatments / Results  Labs (all labs ordered are listed, but only abnormal results are displayed) Labs Reviewed - No data to display  EKG   Radiology No results found.  Procedures Procedures (including critical care time)  Medications Ordered in UC Medications - No data to display  Initial Impression / Assessment and Plan / UC Course  I have reviewed the triage vital signs and the nursing notes.  Pertinent labs & imaging results that were available during my care of the patient were reviewed by me and considered in my medical decision making (see chart for details).    COPD exacerbation, viral illness.  Patient declines testing today.  No respiratory distress, lungs are clear, O2 sat 97% on room air. Treating with albuterol inhaler and prednisone.  Instructed patient to follow up with his PCP if his symptoms are not improving.  He agrees to plan of care.    Final Clinical Impressions(s) / UC Diagnoses   Final diagnoses:  COPD exacerbation ([place])  Viral illness     Discharge Instructions      Use the albuterol inhaler and take the prednisone as directed.  Follow up with your primary care provider.        ED Prescriptions     Medication Sig Dispense Auth. Provider   albuterol (VENTOLIN HFA) 108 (90 Base) MCG/ACT inhaler Inhale 1-2 puffs into the lungs every 6 (six) hours as needed. 18 g Sharion Balloon, NP   predniSONE (DELTASONE) 10 MG tablet Take 4 tablets (40 mg total) by mouth daily for 5 days. 20 tablet Sharion Balloon, NP      PDMP not reviewed this encounter.   Sharion Balloon, NP 01/07/22 [phone]

## 2022-01-07 NOTE — Telephone Encounter (Signed)
Pt called stating [facility] went [facility] and received medication

## 2022-01-07 NOTE — Telephone Encounter (Signed)
Patient states he is experiencing headache, coughing up yellow and brown phlegm, stomach feels upset, fatigue, chest hurts a little. Patient states he has COPD. Patient states his wife has RSV and he received the RSV vaccine 12/31/2021.  I transferred call to Access Nurse.

## 2022-01-07 NOTE — ED Triage Notes (Addendum)
Patient to Urgent Care with complaints of cough, headache, nausea, and brown phlegm. Denies any known fevers.   Symptoms started four days ago. Reports that his wife was recently diagnosed with RSV. Patient had RSV shot on Saturday.   Has been taking advil. Hx COPD, using inhalers as prescribed.

## 2022-01-07 NOTE — Telephone Encounter (Signed)
Noted  

## 2022-01-13 ENCOUNTER — Other Ambulatory Visit: Payer: Self-pay | Admitting: Gastroenterology

## 2022-01-14 ENCOUNTER — Encounter: Payer: Self-pay | Admitting: Family Medicine

## 2022-01-14 ENCOUNTER — Ambulatory Visit (INDEPENDENT_AMBULATORY_CARE_PROVIDER_SITE_OTHER): Payer: Medicare Other | Admitting: Family Medicine

## 2022-01-14 VITALS — BP 115/70 | HR 71 | Temp 98.1°F | Ht 70.0 in | Wt 202.6 lb

## 2022-01-14 DIAGNOSIS — J069 Acute upper respiratory infection, unspecified: Secondary | ICD-10-CM | POA: Diagnosis not present

## 2022-01-14 DIAGNOSIS — J449 Chronic obstructive pulmonary disease, unspecified: Secondary | ICD-10-CM

## 2022-01-14 MED ORDER — FLUTICASONE-SALMETEROL 115-21 MCG/ACT IN AERO: 2.0000 | INHALATION_SPRAY | Freq: Two times a day (BID) | RESPIRATORY_TRACT | 12 refills | Status: DC

## 2022-01-14 NOTE — Addendum Note (Signed)
Addended by: [name] on: 01/14/2022 02:38 PM   Modules accepted: Orders

## 2022-01-14 NOTE — Assessment & Plan Note (Addendum)
Patient is improving from his respiratory infection. With his lungs clear, will not prescribe an antibiotic today. Advised patient to use albuterol 2 puffs every 6 hours as needed for shortness of breath, wheezing, or chest tightness. If symptoms worsen he will let me know.

## 2022-01-14 NOTE — Assessment & Plan Note (Addendum)
Refilled Advair today. Instructed patient to continue Advair 2 puffs BID.

## 2022-01-14 NOTE — Progress Notes (Signed)
Tyler Rumps, MD Phone: [phone]  [name]. is a 66 y.o. male who presents today for urgent care f/u.  Respiratory infection: patient presented to urgent care on 12/1 for complaints of sweats, chills, productive cough, headache, and stomach upset. He states that his wife had gotten sick two weeks ago and he got sick right after her. He reports that his wife went to her provider and tested negative for Covid and flu, so she was presumed to have RSV. When patient went to urgent care, he also did not get any testing so he is unsure if it really is RSV. Today he is still coughing and producing phlegm, but has felt improved from last week. Of note, he did receive the RSV vaccine back on 11/25. At urgent care he received albuterol and prednisone.   Social History   Tobacco Use  Smoking Status Former   Packs/day: 1.00   Years: 20.00   Total pack years: 20.00   Types: Cigarettes   Quit date: 02/08/1991   Years since quitting: 30.9  Smokeless Tobacco Never    Current Outpatient Medications on File Prior to Visit  Medication Sig Dispense Refill   albuterol (VENTOLIN HFA) 108 (90 Base) MCG/ACT inhaler Inhale 1-2 puffs into the lungs every 6 (six) hours as needed. 18 g 0   ALPRAZolam (XANAX) 0.5 MG tablet TAKE 1 TABLET BY MOUTH EVERY NIGHT AT BEDTIME AS NEEDED FOR ANXIETY 20 tablet 0   atorvastatin (LIPITOR) 40 MG tablet Take 1 tablet (40 mg total) by mouth every morning. 90 tablet 3   Azelaic Acid 15 % gel Apply 1 application. topically 2 (two) times daily. After skin is thoroughly washed and patted dry, gently but thoroughly massage a thin film of azelaic acid cream into the affected area twice daily, in the morning and evening. 50 g 2   cyclobenzaprine (FLEXERIL) 10 MG tablet TAKE 1 TABLET BY MOUTH 3 TIMES  DAILY AS NEEDED FOR MUSCLE  SPASM(S) 90 tablet 0   doxycycline (VIBRAMYCIN) 50 MG capsule Take 1 capsule (50 mg total) by mouth 2 (two) times daily. 180 capsule 1   esomeprazole  (NEXIUM) 40 MG capsule Take 1 capsule (40 mg total) by mouth daily before breakfast. 30 capsule 3   finasteride (PROSCAR) 5 MG tablet Take 1 tablet (5 mg total) by mouth daily. 90 tablet 3   fluticasone-salmeterol (ADVAIR HFA) 115-21 MCG/ACT inhaler Inhale 2 puffs into the lungs 2 (two) times daily. 1 each 12   gabapentin (NEURONTIN) 100 MG capsule TAKE 1 CAPSULE BY MOUTH 3 TIMES  DAILY 270 capsule 3   losartan (COZAAR) 50 MG tablet TAKE 1 TABLET BY MOUTH  DAILY 90 tablet 3   meloxicam (MOBIC) 15 MG tablet TAKE 1 TABLET BY MOUTH  DAILY AS NEEDED FOR PAIN 90 tablet 3   pantoprazole (PROTONIX) 40 MG tablet Take 40 mg by mouth daily.     tadalafil (CIALIS) 5 MG tablet TAKE ONE TABLET BY MOUTH DAILY 90 tablet 2   tiotropium (SPIRIVA HANDIHALER) 18 MCG inhalation capsule Place 1 capsule (18 mcg total) into inhaler and inhale daily. 30 capsule 12   TRULANCE 3 MG TABS TAKE 1 TABLET BY MOUTH  DAILY 90 tablet 3   No current facility-administered medications on file prior to visit.     ROS see history of present illness  Objective  Vitals:   01/14/22 1347  BP: 115/70  Pulse: 71  Temp: 98.1 F (36.7 C)  SpO2: 95%  BP Readings from Last 3 Encounters:  01/14/22 115/70  01/07/22 (!) 144/81  11/12/21 118/78   Wt Readings from Last 3 Encounters:  01/14/22 202 lb 9.6 oz (91.9 kg)  01/07/22 200 lb (90.7 kg)  11/12/21 207 lb 1.6 oz (93.9 kg)    Physical Exam Constitutional:      Appearance: Normal appearance.  HENT:     Head: Normocephalic and atraumatic.  Cardiovascular:     Rate and Rhythm: Normal rate and regular rhythm.  Pulmonary:     Effort: Pulmonary effort is normal.     Breath sounds: Normal breath sounds. No wheezing.  Skin:    General: Skin is warm and dry.  Neurological:     Mental Status: He is alert.      Assessment/Plan: Please see individual problem list.  Problem List Items Addressed This Visit     COPD (chronic obstructive pulmonary disease) ([place]) -  Primary (Chronic)    Refilled Advair today. Instructed patient to continue Advair 2 puffs BID.      Viral upper respiratory illness    Patient is improving from his respiratory infection. With his lungs clear, will not prescribe an antibiotic today. Advised patient to use albuterol 2 puffs every 6 hours as needed for shortness of breath, wheezing, or chest tightness. If symptoms worsen he will let me know.         Return if symptoms worsen or fail to improve.   Marisa Cyphers, Medical Student [place]

## 2022-01-14 NOTE — Progress Notes (Signed)
Patient seen along with medical student Zada Zhong.  I personally evaluated this patient along with the student, and verified all aspects of the history, physical exam, and medical decision making as documented by the student.  I agree with the student's documentation and have made all necessary edits.  Ka Flammer, MD  

## 2022-01-19 ENCOUNTER — Ambulatory Visit: Payer: Medicare Other | Admitting: Family Medicine

## 2022-01-26 ENCOUNTER — Other Ambulatory Visit: Payer: Self-pay | Admitting: Family

## 2022-02-01 ENCOUNTER — Other Ambulatory Visit: Payer: Self-pay | Admitting: Gastroenterology

## 2022-02-15 ENCOUNTER — Other Ambulatory Visit (INDEPENDENT_AMBULATORY_CARE_PROVIDER_SITE_OTHER): Payer: Medicare Other

## 2022-02-15 DIAGNOSIS — E785 Hyperlipidemia, unspecified: Secondary | ICD-10-CM

## 2022-02-15 LAB — LDL CHOLESTEROL, DIRECT: Direct LDL: 58 mg/dL

## 2022-02-18 ENCOUNTER — Encounter: Payer: Self-pay | Admitting: Family Medicine

## 2022-02-18 ENCOUNTER — Ambulatory Visit (INDEPENDENT_AMBULATORY_CARE_PROVIDER_SITE_OTHER): Payer: Medicare Other | Admitting: Family Medicine

## 2022-02-18 VITALS — BP 118/70 | HR 58 | Temp 98.2°F | Ht 70.0 in | Wt 204.4 lb

## 2022-02-18 DIAGNOSIS — E785 Hyperlipidemia, unspecified: Secondary | ICD-10-CM

## 2022-02-18 DIAGNOSIS — H539 Unspecified visual disturbance: Secondary | ICD-10-CM | POA: Diagnosis not present

## 2022-02-18 DIAGNOSIS — H43811 Vitreous degeneration, right eye: Secondary | ICD-10-CM | POA: Diagnosis not present

## 2022-02-18 DIAGNOSIS — G4733 Obstructive sleep apnea (adult) (pediatric): Secondary | ICD-10-CM

## 2022-02-18 DIAGNOSIS — H2513 Age-related nuclear cataract, bilateral: Secondary | ICD-10-CM | POA: Diagnosis not present

## 2022-02-18 NOTE — Progress Notes (Signed)
Tyler Rumps, MD Phone: [phone]  [name]. is a 67 y.o. male who presents today for f/u.  HYPERLIPIDEMIA Symptoms Chest pain on exertion:  no   Leg claudication:   no Medications: Compliance- taking lipitor Right upper quadrant pain- no  Muscle aches- no Lipid Panel     Component Value Date/Time   CHOL 156 09/15/2021 1101   TRIG 72.0 09/15/2021 1101   HDL 51.70 09/15/2021 1101   CHOLHDL 3 09/15/2021 1101   VLDL 14.4 09/15/2021 1101   LDLCALC 90 09/15/2021 1101   LDLDIRECT 58.0 02/15/2022 0736   OSA: Patient reports some hypersomnia.  He does not wake up well rested.  He notes he has not been able to tolerate CPAP previously.  Oral device did not work.  He notes he does not want to try the inspire device.  Vision changes/floaters: Patient notes sudden onset of floaters in vision change in his right eye last week.  He notes no history of floaters.  No flashes of light.  He notes this has improved to a certain degree.  He tried to get into see his eye doctor though notes they cannot see him for another 2 weeks.  Social History   Tobacco Use  Smoking Status Former   Packs/day: 1.00   Years: 20.00   Total pack years: 20.00   Types: Cigarettes   Quit date: 02/08/1991   Years since quitting: 31.0  Smokeless Tobacco Never    Current Outpatient Medications on File Prior to Visit  Medication Sig Dispense Refill   albuterol (VENTOLIN HFA) 108 (90 Base) MCG/ACT inhaler Inhale 1-2 puffs into the lungs every 6 (six) hours as needed. 18 g 0   ALPRAZolam (XANAX) 0.5 MG tablet TAKE 1 TABLET BY MOUTH EVERY NIGHT AT BEDTIME AS NEEDED FOR ANXIETY 20 tablet 0   atorvastatin (LIPITOR) 40 MG tablet Take 1 tablet (40 mg total) by mouth every morning. 90 tablet 3   Azelaic Acid 15 % gel Apply 1 application. topically 2 (two) times daily. After skin is thoroughly washed and patted dry, gently but thoroughly massage a thin film of azelaic acid cream into the affected area twice daily,  in the morning and evening. 50 g 2   cyclobenzaprine (FLEXERIL) 10 MG tablet TAKE 1 TABLET BY MOUTH 3 TIMES  DAILY AS NEEDED FOR MUSCLE  SPASM(S) 90 tablet 0   finasteride (PROSCAR) 5 MG tablet Take 1 tablet (5 mg total) by mouth daily. 90 tablet 3   fluticasone-salmeterol (ADVAIR HFA) 115-21 MCG/ACT inhaler Inhale 2 puffs into the lungs 2 (two) times daily. 1 each 12   losartan (COZAAR) 50 MG tablet TAKE 1 TABLET BY MOUTH  DAILY 90 tablet 3   meloxicam (MOBIC) 15 MG tablet TAKE 1 TABLET BY MOUTH  DAILY AS NEEDED FOR PAIN 90 tablet 3   tadalafil (CIALIS) 5 MG tablet TAKE ONE TABLET BY MOUTH DAILY 90 tablet 2   TRULANCE 3 MG TABS TAKE 1 TABLET BY MOUTH  DAILY 90 tablet 3   doxycycline (VIBRAMYCIN) 50 MG capsule Take 1 capsule (50 mg total) by mouth 2 (two) times daily. (Patient not taking: Reported on 02/18/2022) 180 capsule 1   esomeprazole (NEXIUM) 40 MG capsule Take 1 capsule (40 mg total) by mouth daily before breakfast. (Patient not taking: Reported on 02/18/2022) 30 capsule 3   gabapentin (NEURONTIN) 100 MG capsule TAKE 1 CAPSULE BY MOUTH 3 TIMES  DAILY (Patient not taking: Reported on 02/18/2022) 270 capsule 3   pantoprazole (  PROTONIX) 40 MG tablet Take 40 mg by mouth daily. (Patient not taking: Reported on 02/18/2022)     tiotropium (SPIRIVA HANDIHALER) 18 MCG inhalation capsule Place 1 capsule (18 mcg total) into inhaler and inhale daily. (Patient not taking: Reported on 02/18/2022) 30 capsule 12   No current facility-administered medications on file prior to visit.     ROS see history of present illness  Objective  Physical Exam Vitals:   02/18/22 0824  BP: 118/70  Pulse: (!) 58  Temp: 98.2 F (36.8 C)  SpO2: 97%    BP Readings from Last 3 Encounters:  02/18/22 118/70  01/14/22 115/70  01/07/22 (!) 144/81   Wt Readings from Last 3 Encounters:  02/18/22 204 lb 6.4 oz (92.7 kg)  01/14/22 202 lb 9.6 oz (91.9 kg)  01/07/22 200 lb (90.7 kg)    Physical Exam Constitutional:       General: He is not in acute distress.    Appearance: He is not diaphoretic.  Eyes:     Conjunctiva/sclera: Conjunctivae normal.     Pupils: Pupils are equal, round, and reactive to light.     Comments: Fundus exam difficult given pupillary constriction, visual fields intact  Cardiovascular:     Rate and Rhythm: Normal rate and regular rhythm.     Heart sounds: Normal heart sounds.  Pulmonary:     Effort: Pulmonary effort is normal.     Breath sounds: Normal breath sounds.  Skin:    General: Skin is warm and dry.  Neurological:     Mental Status: He is alert.      Assessment/Plan: Please see individual problem list.  Hyperlipidemia, unspecified hyperlipidemia type Assessment & Plan: Chronic issue.  Continue Lipitor 40 mg daily. Well controlled.    Obstructive sleep apnea syndrome Assessment & Plan: Chronic issue.  Patient declines any treatment for this.  Discussed the risk of cardiovascular and pulmonary issues with untreated sleep apnea.   Vision changes Assessment & Plan: Patient with sudden onset floaters and vision changes in his right eye about a week ago.  Concerning for retinal issue.  We will see if we can get him in to his eye doctor today.  We attempted to contact [facility] to see about an appointment today for him though we could not get through to anybody.  The patient was provided with the phone number to call to see about an appointment.  We will also call back later.  CMA did leave a message for them to call back to [place].  We initially called [facility] doctor, the optometrist that the patient typically sees, though they reported there is no optometrist in the office today.     Return in about 3 months (around 05/20/2022).   Tyler Rumps, MD [place]

## 2022-02-18 NOTE — Assessment & Plan Note (Addendum)
Chronic issue.  Continue Lipitor 40 mg daily. Well controlled.

## 2022-02-18 NOTE — Assessment & Plan Note (Addendum)
Patient with sudden onset floaters and vision changes in his right eye about a week ago.  Concerning for retinal issue.  We will see if we can get him in to his eye doctor today.  We attempted to contact [place] [facility] to see about an appointment today for him though we could not get through to anybody.  The patient was provided with the phone number to call to see about an appointment.  We will also call back later.  CMA did leave a message for them to call back to [place].  We initially called [facility] doctor, the optometrist that the patient typically sees, though they reported there is no optometrist in the office today.

## 2022-02-18 NOTE — Assessment & Plan Note (Signed)
Chronic issue.  Patient declines any treatment for this.  Discussed the risk of cardiovascular and pulmonary issues with untreated sleep apnea.

## 2022-02-18 NOTE — Patient Instructions (Addendum)
We will call with your lab results.  We will see about getting you in to see the eye doctor.  You can try calling [facility] at [phone] to see about getting an appointment today as well.  We will attempt to call them again we will attempt to call them again later this morning.

## 2022-02-23 ENCOUNTER — Other Ambulatory Visit: Payer: Self-pay | Admitting: Family Medicine

## 2022-02-23 ENCOUNTER — Other Ambulatory Visit: Payer: Self-pay | Admitting: Family

## 2022-02-23 DIAGNOSIS — G8929 Other chronic pain: Secondary | ICD-10-CM

## 2022-03-03 ENCOUNTER — Telehealth: Payer: Self-pay

## 2022-03-03 DIAGNOSIS — L719 Rosacea, unspecified: Secondary | ICD-10-CM

## 2022-03-03 MED ORDER — DOXYCYCLINE HYCLATE 50 MG PO CAPS: 50.0000 mg | ORAL_CAPSULE | Freq: Two times a day (BID) | ORAL | 3 refills | Status: DC

## 2022-03-03 NOTE — Telephone Encounter (Signed)
Patient was here in November as a NP for Rosacea. At the time he declined doxycycline RX but he called asking for another round of this due to flare. Patient had previously taken Doxy '50mg'$  1 PO BID for 4-6 months. aw

## 2022-03-03 NOTE — Telephone Encounter (Signed)
Patient advised of information per Dr. Kowalski and RX sent in. aw 

## 2022-03-21 ENCOUNTER — Telehealth: Payer: Self-pay | Admitting: Urology

## 2022-03-21 ENCOUNTER — Encounter: Payer: Self-pay | Admitting: *Deleted

## 2022-03-21 NOTE — Telephone Encounter (Signed)
Patient called to ask if he needs to have PSA lab done prior to his 04/14/22 yearly follow up with you. Please advise.

## 2022-04-04 ENCOUNTER — Other Ambulatory Visit: Payer: Medicare Other

## 2022-04-04 ENCOUNTER — Other Ambulatory Visit: Payer: Self-pay

## 2022-04-04 DIAGNOSIS — R972 Elevated prostate specific antigen [PSA]: Secondary | ICD-10-CM

## 2022-04-04 DIAGNOSIS — N138 Other obstructive and reflux uropathy: Secondary | ICD-10-CM

## 2022-04-05 ENCOUNTER — Other Ambulatory Visit: Payer: Medicare Other

## 2022-04-05 DIAGNOSIS — R972 Elevated prostate specific antigen [PSA]: Secondary | ICD-10-CM | POA: Diagnosis not present

## 2022-04-05 DIAGNOSIS — N138 Other obstructive and reflux uropathy: Secondary | ICD-10-CM

## 2022-04-05 DIAGNOSIS — N401 Enlarged prostate with lower urinary tract symptoms: Secondary | ICD-10-CM | POA: Diagnosis not present

## 2022-04-06 LAB — PSA: Prostate Specific Ag, Serum: 1.1 ng/mL (ref 0.0–4.0)

## 2022-04-13 NOTE — Progress Notes (Unsigned)
9:06 AM   [name]. 07/22/1955 [id number]  Referring provider: [name], MD [address] [place],  [place] [address]  Chief Complaint  Patient presents with   Other   Urological history: 1. BPH with LU TS -PSA (03/2022) 1.1 -I PSS 20/3 -PVR 105 mL  -tadalafil 5 mg daily and finasteride 5 mg daily  2. Nocturia -Risk factors for nocturia: obstructive sleep apnea (untreated), hypertension, arthritis, COPD, depression and BPH -Myrbetriq cost prohibitive  3. Erectile dysfunction -contributing factors of age, BPH, HTN, HLD, sleep apnea and depression -not sexually active  4. Epididymal cyst -scrotal ultrasound 2015 3 mm left epididymal cyst   5. Renal cyst -contrast CT 2018 -  3.2 cm right mid pole renal cyst  HPI: [name] who presents today for yearly visit.   I PSS 20/3  PVR 105 mL   He has a bothersome urinary symptoms, but they are not to the point where he wants to pursue a procedure.  Patient denies any modifying or aggravating factors.  Patient denies any gross hematuria, dysuria or suprapubic/flank pain.  Patient denies any fevers, chills, nausea or vomiting.   He is taking the tadalafil 5 mg daily and the finasteride 5 mg daily   IPSS     Row Name 04/14/22 0800         International Prostate Symptom Score   How often have you had the sensation of not emptying your bladder? About half the time     How often have you had to urinate less than every two hours? More than half the time     How often have you found you stopped and started again several times when you urinated? Less than half the time     How often have you found it difficult to postpone urination? About half the time     How often have you had a weak urinary stream? About half the time     How often have you had to strain to start urination? Less than half the time     How many times did you typically get up at night to urinate? 3 Times      Total IPSS Score 20       Quality of Life due to urinary symptoms   If you were to spend the rest of your life with your urinary condition just the way it is now how would you feel about that? Mixed               Score:  1-7 Mild 8-19 Moderate 20-35 Severe    PMH: Past Medical History:  Diagnosis Date   Anxiety    Arthritis    BPH (benign prostatic hyperplasia) 11/19/2013   Bulge of lumbar disc without myelopathy    L2/3 through L5/6   Bulging disc    C2/3, C3/4, C6/7   Cervical disc herniation    C4/5 and C5/6   Colon polyp 04/18/2011   Constipation 08/31/2012   Depression    Diverticulosis    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/2012   GERD (gastroesophageal reflux disease)    H/O diverticulitis of colon    Hemorrhoid    Hiatal hernia    Hyperlipidemia    Levoscoliosis    Sleep difficulties    Spinal stenosis in cervical region    cord abutment C4/5   Testicular pain, left    Typical atrial  flutter ([place]) 11/2015   a. CHADS2VASc => 0; b. on Eliquis for pending ablation     Surgical History: Past Surgical History:  Procedure Laterality Date   ATRIAL FLUTTER ABLATION  02/17/2016   COLONOSCOPY  2014   COLONOSCOPY WITH PROPOFOL N/A 06/30/2016   Procedure: COLONOSCOPY WITH PROPOFOL;  Surgeon: Lucilla Lame, MD;  Location: [facility];  Service: Endoscopy;  Laterality: N/A;   ELECTROPHYSIOLOGIC STUDY N/A 01/04/2016   Procedure: Cardioversion;  Surgeon: Wellington Hampshire, MD;  Location: [facility];  Service: Cardiovascular;  Laterality: N/A;   ELECTROPHYSIOLOGIC STUDY N/A 02/17/2016   Procedure: A-Flutter Ablation;  Surgeon: Deboraha Sprang, MD;  Location: [facility];  Service: Cardiovascular;  Laterality: N/A;   GANGLION CYST EXCISION Right 1994   wrist & back   HERNIA REPAIR Right 1994   abdominal repair with mesh   NASAL SEPTUM SURGERY  2011   Dr. [name]     Home Medications:  Allergies as of 04/14/2022       Reactions    Other Other (See Comments)   Pollen:  Sinus infection, cough, fatigue Other reaction(s): Other (See Comments), Other (See Comments) Pollen:  Sinus infection, cough, fatigue Pollen:  Sinus infection, cough, fatigue        Medication List        Accurate as of April 14, 2022  9:06 AM. If you have any questions, ask your nurse or doctor.          STOP taking these medications    esomeprazole 40 MG capsule Commonly known as: NexIUM Stopped by: Earmon Sherrow, [name]   gabapentin 100 MG capsule Commonly known as: NEURONTIN Stopped by: Merica Prell, [name]   Trulance 3 MG Tabs Generic drug: Plecanatide Stopped by: Zara Council, [name]       TAKE these medications    albuterol 108 (90 Base) MCG/ACT inhaler Commonly known as: VENTOLIN HFA Inhale 1-2 puffs into the lungs every 6 (six) hours as needed.   ALPRAZolam 0.5 MG tablet Commonly known as: XANAX TAKE 1 TABLET BY MOUTH EVERY NIGHT AT BEDTIME AS NEEDED FOR ANXIETY   atorvastatin 40 MG tablet Commonly known as: LIPITOR Take 1 tablet (40 mg total) by mouth every morning.   Azelaic Acid 15 % gel Apply 1 application. topically 2 (two) times daily. After skin is thoroughly washed and patted dry, gently but thoroughly massage a thin film of azelaic acid cream into the affected area twice daily, in the morning and evening.   cyclobenzaprine 10 MG tablet Commonly known as: FLEXERIL TAKE 1 TABLET BY MOUTH 3 TIMES  DAILY AS NEEDED FOR MUSCLE  SPASM(S)   doxycycline 50 MG capsule Commonly known as: VIBRAMYCIN Take 1 capsule (50 mg total) by mouth 2 (two) times daily.   finasteride 5 MG tablet Commonly known as: PROSCAR Take 1 tablet (5 mg total) by mouth daily.   fluticasone-salmeterol 115-21 MCG/ACT inhaler Commonly known as: Advair HFA Inhale 2 puffs into the lungs 2 (two) times daily.   losartan 50 MG tablet Commonly known as: COZAAR TAKE 1 TABLET BY MOUTH  DAILY   meloxicam 15 MG tablet Commonly known  as: MOBIC TAKE 1 TABLET BY MOUTH  DAILY AS NEEDED FOR PAIN   pantoprazole 40 MG tablet Commonly known as: PROTONIX TAKE 1 TABLET BY MOUTH DAILY   Spiriva HandiHaler 18 MCG inhalation capsule Generic drug: tiotropium Place 1 capsule (18 mcg total) into inhaler and inhale daily.   tadalafil 5 MG tablet Commonly known  as: CIALIS TAKE ONE TABLET BY MOUTH DAILY        Allergies:  Allergies  Allergen Reactions   Other Other (See Comments)    Pollen:  Sinus infection, cough, fatigue  Other reaction(s): Other (See Comments), Other (See Comments) Pollen:  Sinus infection, cough, fatigue Pollen:  Sinus infection, cough, fatigue    Family History: Family History  Problem Relation Age of Onset   Hyperlipidemia Mother    Cancer Mother        skin cancer?   Heart disease Mother 39       MI - died in her sleep   Hyperlipidemia Father    Heart disease Father    Kidney disease Neg Hx    Prostate cancer Neg Hx    Kidney cancer Neg Hx    Bladder Cancer Neg Hx     Social History:  reports that he quit smoking about 31 years ago. His smoking use included cigarettes. He has a 20.00 pack-year smoking history. He has never used smokeless tobacco. He reports that he does not drink alcohol and does not use drugs.  ROS: For pertinent review of systems please refer to history of present illness  Physical Exam: BP 137/80   Pulse 84   Ht '5\' 10"'$  (1.778 m)   Wt 200 lb (90.7 kg)   BMI 28.70 kg/m   Constitutional:  Well nourished. Alert and oriented, No acute distress. HEENT: [place] AT, moist mucus membranes.  Trachea midline Cardiovascular: No clubbing, cyanosis, or edema. Respiratory: Normal respiratory effort, no increased work of breathing. GU: No CVA tenderness.  No bladder fullness or masses.  Patient with circumcised phallus. Foreskin easily retracted  Urethral meatus is patent.  No penile discharge. No penile lesions or rashes. Scrotum without lesions, cysts, rashes and/or edema.   Testicles are located scrotally bilaterally. No masses are appreciated in the testicles. Left and right epididymis are normal. Rectal: Patient with  normal sphincter tone. Anus and perineum without scarring or rashes. No rectal masses are appreciated. Prostate is approximately 45 grams, no nodules are appreciated. Seminal vesicles could not be palpated. Neurologic: Grossly intact, no focal deficits, moving all 4 extremities. Psychiatric: Normal mood and affect.   Laboratory Data:  Prostate Specific Ag, Serum  Latest Ref Rng 0.0 - 4.0 ng/mL  11/20/2017 1.6   11/21/2018 1.8   11/19/2019 1.4   11/23/2020 1.1   04/05/2022 1.1    Serum creatinine (09/2021) 0.87 Total cholesterol (09/2021) 156 I have reviewed the labs.  Pertinent Imaging  04/14/22 08:43  Scan Result 105    Assessment & Plan:    1. BPH with LUTS -PSA stable -DRE benign -PVR < 300 cc -most bothersome symptoms are intermittency at night -continue conservative management, avoiding bladder irritants and timed voiding's -Not ready to pursue a bladder outlet procedure at this time -continue Cialis 5 mg daily -prescription sent to [facility]  -continue finasteride 5 mg daily    2. Nocturia  -Myrbetriq cost prohibitive  -cannot tolerate CPAP machine  3. ED -not sexually active  4. Epididymal cysts -Unchanged on physical exam -If he were to pursue a bladder outlet procedure he would prefer to have the epididymal cyst removed at the same time -Continue to monitor  Return in about 1 year (around 04/14/2023) for IPSS, SHIM, PSA and exam.  These notes generated with voice recognition software. I apologize for typographical errors.  Royden Purl  [place] [address] [place], [place]  [address] [phone]   

## 2022-04-14 ENCOUNTER — Ambulatory Visit (INDEPENDENT_AMBULATORY_CARE_PROVIDER_SITE_OTHER): Payer: Medicare Other | Admitting: Urology

## 2022-04-14 ENCOUNTER — Encounter: Payer: Self-pay | Admitting: Urology

## 2022-04-14 VITALS — BP 137/80 | HR 84 | Ht 70.0 in | Wt 200.0 lb

## 2022-04-14 DIAGNOSIS — R351 Nocturia: Secondary | ICD-10-CM

## 2022-04-14 DIAGNOSIS — N529 Male erectile dysfunction, unspecified: Secondary | ICD-10-CM | POA: Diagnosis not present

## 2022-04-14 DIAGNOSIS — N401 Enlarged prostate with lower urinary tract symptoms: Secondary | ICD-10-CM

## 2022-04-14 DIAGNOSIS — N503 Cyst of epididymis: Secondary | ICD-10-CM

## 2022-04-14 DIAGNOSIS — N138 Other obstructive and reflux uropathy: Secondary | ICD-10-CM

## 2022-04-14 LAB — BLADDER SCAN AMB NON-IMAGING: Scan Result: 105

## 2022-04-14 MED ORDER — TADALAFIL 5 MG PO TABS: ORAL_TABLET | ORAL | 2 refills | Status: DC

## 2022-04-18 ENCOUNTER — Telehealth: Payer: Self-pay | Admitting: Urology

## 2022-04-18 NOTE — Telephone Encounter (Signed)
Patient called today and is interested in possibly pursuing the bladder outlet procedure discussed at his last appointment. Please advise him on how to proceed.

## 2022-06-02 ENCOUNTER — Other Ambulatory Visit: Payer: Self-pay | Admitting: Family

## 2022-06-23 ENCOUNTER — Ambulatory Visit (INDEPENDENT_AMBULATORY_CARE_PROVIDER_SITE_OTHER): Payer: Medicare Other | Admitting: Gastroenterology

## 2022-06-23 ENCOUNTER — Encounter: Payer: Self-pay | Admitting: Gastroenterology

## 2022-06-23 VITALS — BP 129/78 | HR 54 | Temp 98.4°F | Ht 69.0 in | Wt 202.0 lb

## 2022-06-23 DIAGNOSIS — Z86010 Personal history of colonic polyps: Secondary | ICD-10-CM | POA: Diagnosis not present

## 2022-06-23 DIAGNOSIS — R1013 Epigastric pain: Secondary | ICD-10-CM

## 2022-06-23 DIAGNOSIS — K219 Gastro-esophageal reflux disease without esophagitis: Secondary | ICD-10-CM

## 2022-06-23 MED ORDER — NA SULFATE-K SULFATE-MG SULF 17.5-3.13-1.6 GM/177ML PO SOLN: 1.0000 | Freq: Once | ORAL | 0 refills | Status: AC

## 2022-06-23 NOTE — Progress Notes (Signed)
Gastroenterology Consultation  Referring Provider:     Glori Luis, MD Primary Care Physician:  Glori Luis, MD Primary Gastroenterologist:  Dr. Servando Snare     Reason for Consultation:     Dyspepsia        HPI:   Tyler Carazo. is a 67 y.o. y/o male referred for consultation & management of dyspepsia by Dr. Birdie Sons, Yehuda Mao, MD. This patient comes in to see me today with a history of having a colonoscopy back in 2018.  At that time the patient reports that he had polyps and was due for repeat colonoscopy in 5 years.  The patient has also had a history of dyspepsia.  He reports that his dyspepsia manifested as burping.  He has a lot of bloating and burping and states that sometimes he will feel like he has to burp and it just takes a long time for him to get up and then when he finally is able to burp a large amount of gas comes out.  This usually happens at night before he goes to sleep.  He states that he feels much better.  The patient was explained about swallowing air and the things to avoid.  He denies any dysphagia unexplained weight loss fevers chills nausea or  Past Medical History:  Diagnosis Date   Anxiety    Arthritis    BPH (benign prostatic hyperplasia) 11/19/2013   Bulge of lumbar disc without myelopathy    L2/3 through L5/6   Bulging disc    C2/3, C3/4, C6/7   Cervical disc herniation    C4/5 and C5/6   Colon polyp 04/18/2011   Constipation 08/31/2012   Depression    Diverticulosis    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/2012   GERD (gastroesophageal reflux disease)    H/O diverticulitis of colon    Hemorrhoid    Hiatal hernia    Hyperlipidemia    Levoscoliosis    Sleep difficulties    Spinal stenosis in cervical region    cord abutment C4/5   Testicular pain, left    Typical atrial flutter ([facility]) 11/2015   a. CHADS2VASc => 0; b. on Eliquis for pending ablation     Past Surgical History:  Procedure Laterality Date   ATRIAL FLUTTER  ABLATION  02/17/2016   COLONOSCOPY  2014   COLONOSCOPY WITH PROPOFOL N/A 06/30/2016   Procedure: COLONOSCOPY WITH PROPOFOL;  Surgeon: [name], MD;  Location: [facility];  Service: Endoscopy;  Laterality: N/A;   ELECTROPHYSIOLOGIC STUDY N/A 01/04/2016   Procedure: Cardioversion;  Surgeon: [name], MD;  Location: [facility];  Service: Cardiovascular;  Laterality: N/A;   ELECTROPHYSIOLOGIC STUDY N/A 02/17/2016   Procedure: A-Flutter Ablation;  Surgeon: [name], MD;  Location: [facility];  Service: Cardiovascular;  Laterality: N/A;   GANGLION CYST EXCISION Right 1994   wrist & back   HERNIA REPAIR Right 1994   abdominal repair with mesh   NASAL SEPTUM SURGERY  2011   Dr. Willeen Cass     Prior to Admission medications   Medication Sig Start Date End Date Taking? Authorizing Provider  albuterol (VENTOLIN HFA) 108 (90 Base) MCG/ACT inhaler Inhale 1-2 puffs into the lungs every 6 (six) hours as needed. 01/07/22   Mickie Bail, NP  ALPRAZolam Prudy Feeler) 0.5 MG tablet TAKE 1 TABLET BY MOUTH EVERY NIGHT AT BEDTIME AS NEEDED FOR ANXIETY 03/19/21   Worthy Rancher B, FNP  atorvastatin (LIPITOR) 40  MG tablet Take 1 tablet (40 mg total) by mouth every morning. 09/22/21   Glori Luis, MD  Azelaic Acid 15 % gel Apply 1 application. topically 2 (two) times daily. After skin is thoroughly washed and patted dry, gently but thoroughly massage a thin film of azelaic acid cream into the affected area twice daily, in the morning and evening. 06/18/21   McLean-Scocuzza, Pasty Spillers, MD  cyclobenzaprine (FLEXERIL) 10 MG tablet TAKE 1 TABLET BY MOUTH 3 TIMES  DAILY AS NEEDED FOR MUSCLE  SPASM(S) 10/20/21   Worthy Rancher B, FNP  doxycycline (VIBRAMYCIN) 50 MG capsule Take 1 capsule (50 mg total) by mouth 2 (two) times daily. 03/03/22   Deirdre Evener, MD  finasteride (PROSCAR) 5 MG tablet Take 1 tablet (5 mg total) by mouth daily. 09/05/21 08/31/22  Michiel Cowboy A, [name]   fluticasone-salmeterol (ADVAIR HFA) 161-09 MCG/ACT inhaler Inhale 2 puffs into the lungs 2 (two) times daily. 01/14/22   Glori Luis, MD  losartan (COZAAR) 50 MG tablet TAKE 1 TABLET BY MOUTH  DAILY 07/25/21   Worthy Rancher B, FNP  meloxicam (MOBIC) 15 MG tablet TAKE 1 TABLET BY MOUTH  DAILY AS NEEDED FOR PAIN 03/03/21   Worthy Rancher B, FNP  pantoprazole (PROTONIX) 40 MG tablet TAKE 1 TABLET BY MOUTH DAILY 02/23/22   Glori Luis, MD  tadalafil (CIALIS) 5 MG tablet TAKE ONE TABLET BY MOUTH DAILY 04/14/22   Marvel Plan, Carollee Herter A, [name]  tiotropium (SPIRIVA HANDIHALER) 18 MCG inhalation capsule Place 1 capsule (18 mcg total) into inhaler and inhale daily. Patient not taking: Reported on 02/18/2022 09/22/21   Glori Luis, MD    Family History  Problem Relation Age of Onset   Hyperlipidemia Mother    Cancer Mother        skin cancer?   Heart disease Mother 48       MI - died in her sleep   Hyperlipidemia Father    Heart disease Father    Kidney disease Neg Hx    Prostate cancer Neg Hx    Kidney cancer Neg Hx    Bladder Cancer Neg Hx      Social History   Tobacco Use   Smoking status: Former    Packs/day: 1.00    Years: 20.00    Additional pack years: 0.00    Total pack years: 20.00    Types: Cigarettes    Quit date: 02/08/1991    Years since quitting: 31.3   Smokeless tobacco: Never  Substance Use Topics   Alcohol use: No    Alcohol/week: 0.0 standard drinks of alcohol   Drug use: No    Allergies as of 06/23/2022 - Review Complete 04/14/2022  Allergen Reaction Noted   Other Other (See Comments) 06/16/2011    Review of Systems:    All systems reviewed and negative except where noted in HPI.   Physical Exam:  There were no vitals taken for this visit. No LMP for male patient. General:   Alert,  Well-developed, well-nourished, pleasant and cooperative in NAD Head:  Normocephalic and atraumatic. Eyes:  Sclera clear, no icterus.   Conjunctiva pink. Neurologic:   Alert and oriented x3;  grossly normal neurologically. Skin:  Intact without significant lesions or rashes.  No jaundice. Lymph Nodes:  No significant cervical adenopathy. Psych:  Alert and cooperative. Normal mood and affect.  Imaging Studies: No results found.  Assessment and Plan:   Tyler Prejean. is a 67 y.o. y/o  male who comes in today with a history of burping/dyspepsia and GERD with a history of colon polyps with his last colonoscopy reported in 2018.  The patient will be set up for an EGD and colonoscopy due to his GERD with dyspepsia and history of colon polyps.  The patient has been explained the plan and agrees with it.    [name], MD. Clementeen Graham    Note: This dictation was prepared with Dragon dictation along with smaller phrase technology. Any transcriptional errors that result from this process are unintentional.

## 2022-06-24 ENCOUNTER — Telehealth: Payer: Self-pay | Admitting: Family Medicine

## 2022-06-24 ENCOUNTER — Telehealth: Payer: Self-pay | Admitting: Urgent Care

## 2022-06-24 MED ORDER — LOSARTAN POTASSIUM 50 MG PO TABS: 50.0000 mg | ORAL_TABLET | Freq: Every day | ORAL | 3 refills | Status: DC

## 2022-06-24 NOTE — Telephone Encounter (Signed)
Note opened in error.

## 2022-06-24 NOTE — Telephone Encounter (Signed)
Prescription Request  06/24/2022  LOV: 02/18/2022  What is the name of the medication or equipment? losartan (COZAAR) 50 MG tablet , 90 Day Supply  Also ,would like to have his ambien refilled at [facility] on [address]    Have you contacted your pharmacy to request a refill? Yes   Which pharmacy would you like this sent to?    [facility] mail order pharmacy   Patient notified that their request is being sent to the clinical staff for review and that they should receive a response within 2 business days.   Please advise at Mobile [phone] (mobile)

## 2022-07-05 ENCOUNTER — Encounter: Payer: Self-pay | Admitting: Gastroenterology

## 2022-07-05 NOTE — Addendum Note (Signed)
Addended by: Roena Malady on: 07/05/2022 02:58 PM   Modules accepted: Orders

## 2022-07-05 NOTE — Addendum Note (Signed)
Addended by: Roena Malady on: 07/05/2022 02:36 PM   Modules accepted: Orders

## 2022-07-06 NOTE — Anesthesia Preprocedure Evaluation (Signed)
Anesthesia Evaluation  Patient identified by MRN, date of birth, ID band Patient awake    Reviewed: Allergy & Precautions, H&P , NPO status , Patient's Chart, lab work & pertinent test results  Airway Mallampati: I  TM Distance: >3 FB Neck ROM: Full    Dental no notable dental hx. (+) Caps Both upper central incisors are capped, plus multiple other teeth:   Pulmonary shortness of breath, sleep apnea , COPD, former smoker Supposed to use CPAP, but has severe claustrophobia and cannot use it.    Pulmonary exam normal breath sounds clear to auscultation       Cardiovascular hypertension, Normal cardiovascular exam+ dysrhythmias Atrial Fibrillation  Rhythm:Regular Rate:Normal  Had cardiac ablation and is doing well from that, heart rate regular today   Neuro/Psych  Headaches PSYCHIATRIC DISORDERS Anxiety Depression    Extremely anxious today, has difficult vein access, and is very anxious about IV. Severe claustrophobia as well. Neuromuscular disease    GI/Hepatic Neg liver ROS, hiatal hernia,GERD  ,,  Endo/Other  negative endocrine ROS    Renal/GU negative Renal ROS  negative genitourinary   Musculoskeletal  (+) Arthritis ,    Abdominal   Peds negative pediatric ROS (+)  Hematology negative hematology ROS (+)   Anesthesia Other Findings H/O diverticulitis of colon  GERD  Hyperlipidemia  Diverticulosis Bulging disc  Cervical disc herniation Spinal stenosis in cervical region  Bulge of lumbar disc without myelopathy Levoscoliosis  Hemorrhoid Hiatal hernia  BPH (benign prostatic hyperplasia) ED (erectile dysfunction) of organic origin Testicular pain, left Colon polyp  Constipation Typical atrial flutter ([facility])  Depression Anxiety  Sleep difficulties es Arthritis  COPD (chronic obstructive pulmonary disease) (HCC) Sleep apnea  Motion sickness Hx atrial flutter    Reproductive/Obstetrics negative OB  ROS                              Anesthesia Physical Anesthesia Plan  ASA: 3  Anesthesia Plan: General   Post-op Pain Management:    Induction: Intravenous  PONV Risk Score and Plan:   Airway Management Planned: Natural Airway and Nasal Cannula  Additional Equipment:   Intra-op Plan:   Post-operative Plan:   Informed Consent: I have reviewed the patients History and Physical, chart, labs and discussed the procedure including the risks, benefits and alternatives for the proposed anesthesia with the patient or authorized representative who has indicated his/her understanding and acceptance.     Dental Advisory Given  Plan Discussed with: Anesthesiologist, CRNA and Surgeon  Anesthesia Plan Comments: (Patient consented for risks of anesthesia including but not limited to:  - adverse reactions to medications - risk of airway placement if required - damage to eyes, teeth, lips or other oral mucosa - nerve damage due to positioning  - sore throat or hoarseness - Damage to heart, brain, nerves, lungs, other parts of body or loss of life  Patient voiced understanding.)         Anesthesia Quick Evaluation

## 2022-07-07 ENCOUNTER — Encounter
Admission: RE | Disposition: A | Discharge status: Home / Self Care | Payer: Self-pay | Source: Home / Self Care | Attending: Gastroenterology

## 2022-07-07 ENCOUNTER — Encounter: Payer: Self-pay | Admitting: Gastroenterology

## 2022-07-07 ENCOUNTER — Other Ambulatory Visit: Payer: Self-pay

## 2022-07-07 ENCOUNTER — Ambulatory Visit: Payer: Medicare Other | Admitting: Anesthesiology

## 2022-07-07 ENCOUNTER — Other Ambulatory Visit: Payer: Self-pay | Admitting: Gastroenterology

## 2022-07-07 ENCOUNTER — Ambulatory Visit
Admission: RE | Admit: 2022-07-07 | Discharge: 2022-07-07 | Disposition: A | Discharge status: Home / Self Care | Payer: Medicare Other | Attending: Gastroenterology | Admitting: Gastroenterology

## 2022-07-07 DIAGNOSIS — K5730 Diverticulosis of large intestine without perforation or abscess without bleeding: Secondary | ICD-10-CM | POA: Insufficient documentation

## 2022-07-07 DIAGNOSIS — K449 Diaphragmatic hernia without obstruction or gangrene: Secondary | ICD-10-CM | POA: Insufficient documentation

## 2022-07-07 DIAGNOSIS — Z09 Encounter for follow-up examination after completed treatment for conditions other than malignant neoplasm: Secondary | ICD-10-CM | POA: Insufficient documentation

## 2022-07-07 DIAGNOSIS — Z860100 Personal history of colon polyps, unspecified: Secondary | ICD-10-CM

## 2022-07-07 DIAGNOSIS — K222 Esophageal obstruction: Secondary | ICD-10-CM | POA: Insufficient documentation

## 2022-07-07 DIAGNOSIS — Z87891 Personal history of nicotine dependence: Secondary | ICD-10-CM | POA: Insufficient documentation

## 2022-07-07 DIAGNOSIS — K641 Second degree hemorrhoids: Secondary | ICD-10-CM | POA: Diagnosis not present

## 2022-07-07 DIAGNOSIS — D124 Benign neoplasm of descending colon: Secondary | ICD-10-CM | POA: Insufficient documentation

## 2022-07-07 DIAGNOSIS — K635 Polyp of colon: Secondary | ICD-10-CM

## 2022-07-07 DIAGNOSIS — F40240 Claustrophobia: Secondary | ICD-10-CM | POA: Diagnosis not present

## 2022-07-07 DIAGNOSIS — Z1211 Encounter for screening for malignant neoplasm of colon: Secondary | ICD-10-CM | POA: Insufficient documentation

## 2022-07-07 DIAGNOSIS — R1013 Epigastric pain: Secondary | ICD-10-CM

## 2022-07-07 DIAGNOSIS — F418 Other specified anxiety disorders: Secondary | ICD-10-CM | POA: Insufficient documentation

## 2022-07-07 DIAGNOSIS — K219 Gastro-esophageal reflux disease without esophagitis: Secondary | ICD-10-CM

## 2022-07-07 DIAGNOSIS — I4891 Unspecified atrial fibrillation: Secondary | ICD-10-CM | POA: Diagnosis not present

## 2022-07-07 HISTORY — DX: Chronic obstructive pulmonary disease, unspecified: J44.9

## 2022-07-07 HISTORY — DX: Motion sickness, initial encounter: T75.3XXA

## 2022-07-07 HISTORY — PX: ESOPHAGOGASTRODUODENOSCOPY (EGD) WITH PROPOFOL: SHX5813

## 2022-07-07 HISTORY — DX: Sleep apnea, unspecified: G47.30

## 2022-07-07 HISTORY — DX: Personal history of other diseases of the circulatory system: Z86.79

## 2022-07-07 HISTORY — DX: Claustrophobia: F40.240

## 2022-07-07 HISTORY — PX: COLONOSCOPY WITH PROPOFOL: SHX5780

## 2022-07-07 SURGERY — COLONOSCOPY WITH PROPOFOL
Anesthesia: General

## 2022-07-07 MED ORDER — LACTATED RINGERS IV SOLN: INTRAVENOUS | Status: DC

## 2022-07-07 MED ORDER — TRULANCE 3 MG PO TABS: 1.0000 | ORAL_TABLET | ORAL | 0 refills | Status: AC

## 2022-07-07 MED ORDER — STERILE WATER FOR IRRIGATION IR SOLN: Status: DC | PRN

## 2022-07-07 MED ORDER — STERILE WATER FOR IRRIGATION IR SOLN
Status: DC | PRN
  Administered 2022-07-07: 1000 mL

## 2022-07-07 MED ORDER — PROPOFOL 10 MG/ML IV BOLUS
INTRAVENOUS | Status: DC | PRN
  Administered 2022-07-07: 20 mg via INTRAVENOUS
  Administered 2022-07-07: 90 mg via INTRAVENOUS
  Administered 2022-07-07: 20 mg via INTRAVENOUS
  Administered 2022-07-07 (×2): 40 mg via INTRAVENOUS
  Administered 2022-07-07 (×2): 20 mg via INTRAVENOUS
  Administered 2022-07-07: 30 mg via INTRAVENOUS

## 2022-07-07 MED ORDER — LIDOCAINE HCL (CARDIAC) PF 100 MG/5ML IV SOSY
PREFILLED_SYRINGE | INTRAVENOUS | Status: DC | PRN
  Administered 2022-07-07: 30 mg via INTRAVENOUS

## 2022-07-07 MED ORDER — SODIUM CHLORIDE 0.9 % IV SOLN: INTRAVENOUS | Status: DC

## 2022-07-07 SURGICAL SUPPLY — 10 items
BALLN DILATOR 15-18 8 (BALLOONS) ×1
BALLOON DILATOR 15-18 8 (BALLOONS) IMPLANT
BLOCK BITE 60FR ADLT L/F GRN (MISCELLANEOUS) ×1 IMPLANT
GOWN CVR UNV OPN BCK APRN NK (MISCELLANEOUS) ×4 IMPLANT
GOWN ISOL THUMB LOOP REG UNIV (MISCELLANEOUS) ×4
KIT PRC NS LF DISP ENDO (KITS) ×2 IMPLANT
KIT PROCEDURE OLYMPUS (KITS) ×2
MANIFOLD NEPTUNE II (INSTRUMENTS) ×2 IMPLANT
SYR INFLATION 60ML (SYRINGE) IMPLANT
WATER STERILE IRR 250ML POUR (IV SOLUTION) ×2 IMPLANT

## 2022-07-07 NOTE — Op Note (Signed)
[facility] Patient Name: [name] Procedure Date: 07/07/2022 8:25 AM MRN: [id number] Account #: [id number] Date of Birth: [birth year] Admit Type: Outpatient Age: 67 Room: [facility] OR ROOM 01 Gender: Male Note Status: Finalized Instrument Name: [id number] Procedure:             Colonoscopy Indications:           High risk colon cancer surveillance: Personal history                         of colonic polyps Providers:             [name] MD, MD Referring MD:          Yehuda Mao. Birdie Sons (Referring MD) Medicines:             Propofol per Anesthesia Complications:         No immediate complications. Procedure:             Pre-Anesthesia Assessment:                        - Prior to the procedure, a History and Physical was                         performed, and patient medications and allergies were                         reviewed. The patient's tolerance of previous                         anesthesia was also reviewed. The risks and benefits                         of the procedure and the sedation options and risks                         were discussed with the patient. All questions were                         answered, and informed consent was obtained. Prior                         Anticoagulants: The patient has taken no anticoagulant                         or antiplatelet agents. ASA Grade Assessment: II - A                         patient with mild systemic disease. After reviewing                         the risks and benefits, the patient was deemed in                         satisfactory condition to undergo the procedure.                        After obtaining informed consent, the colonoscope was  passed under direct vision. Throughout the procedure,                         the patient's blood pressure, pulse, and oxygen                         saturations were monitored continuously. The                          Colonoscope was introduced through the anus and                         advanced to the the cecum, identified by appendiceal                         orifice and ileocecal valve. The colonoscopy was                         performed without difficulty. The patient tolerated                         the procedure well. The quality of the bowel                         preparation was excellent. Findings:      The perianal and digital rectal examinations were normal.      A 2 mm polyp was found in the descending colon. The polyp was sessile.       The polyp was removed with a cold biopsy forceps. Resection and       retrieval were complete.      Many small-mouthed diverticula were found in the entire colon.      Non-bleeding internal hemorrhoids were found during retroflexion. The       hemorrhoids were Grade II (internal hemorrhoids that prolapse but reduce       spontaneously). Impression:            - One 2 mm polyp in the descending colon, removed with                         a cold biopsy forceps. Resected and retrieved.                        - Diverticulosis in the entire examined colon.                        - Non-bleeding internal hemorrhoids. Recommendation:        - Discharge patient to home.                        - Resume previous diet.                        - Continue present medications.                        - Await pathology results.                        - Repeat colonoscopy in 7 years for surveillance. Procedure Code(s):     ---  Professional ---                        [phone], Colonoscopy, flexible; with biopsy, single or                         multiple Diagnosis Code(s):     --- Professional ---                        Z86.010, Personal history of colonic polyps                        D12.4, Benign neoplasm of descending colon CPT copyright 2022 American Medical Association. All rights reserved. The codes documented in this report are preliminary and upon coder review  may  be revised to meet current compliance requirements. [name] MD, MD 07/07/2022 8:54:50 AM This report has been signed electronically. Number of Addenda: 0 Note Initiated On: 07/07/2022 8:25 AM Scope Withdrawal Time: 0 hours 7 minutes 43 seconds  Total Procedure Duration: 0 hours 12 minutes 12 seconds  Estimated Blood Loss:  Estimated blood loss: none.      [facility]

## 2022-07-07 NOTE — Op Note (Signed)
[facility] Patient Name: [name] Procedure Date: 07/07/2022 8:25 AM MRN: [id number] Account #: [id number] Date of Birth: 10-26-1955 Admit Type: Outpatient Age: 67 Room: [facility] OR ROOM 1 Gender: Male Note Status: Finalized Instrument Name: [id number] Procedure:             Upper GI endoscopy Indications:           Dysphagia Providers:             [name] MD, MD Referring MD:          Yehuda Mao. Birdie Sons (Referring MD) Medicines:             Propofol per Anesthesia Complications:         No immediate complications. Procedure:             Pre-Anesthesia Assessment:                        - Prior to the procedure, a History and Physical was                         performed, and patient medications and allergies were                         reviewed. The patient's tolerance of previous                         anesthesia was also reviewed. The risks and benefits                         of the procedure and the sedation options and risks                         were discussed with the patient. All questions were                         answered, and informed consent was obtained. Prior                         Anticoagulants: The patient has taken no anticoagulant                         or antiplatelet agents. ASA Grade Assessment: II - A                         patient with mild systemic disease. After reviewing                         the risks and benefits, the patient was deemed in                         satisfactory condition to undergo the procedure.                        After obtaining informed consent, the endoscope was                         passed under direct vision. Throughout the procedure,  the patient's blood pressure, pulse, and oxygen                         saturations were monitored continuously. The was                         introduced through the mouth, and advanced to the                          second part of duodenum. The upper GI endoscopy was                         accomplished without difficulty. The patient tolerated                         the procedure well. Findings:      A small hiatal hernia was present.      One benign-appearing, intrinsic mild stenosis was found at the       gastroesophageal junction. The stenosis was traversed. A TTS dilator was       passed through the scope. Dilation with a 15-16.5-18 mm balloon dilator       was performed to 18 mm. The dilation site was examined following       endoscope reinsertion and showed complete resolution of luminal       narrowing.      The stomach was normal.      The examined duodenum was normal. Impression:            - Small hiatal hernia.                        - Benign-appearing esophageal stenosis. Dilated.                        - Normal stomach.                        - Normal examined duodenum.                        - No specimens collected. Recommendation:        - Discharge patient to home.                        - Resume previous diet.                        - Continue present medications.                        - Await pathology results.                        - Perform a colonoscopy today. Procedure Code(s):     --- Professional ---                        [phone], Esophagogastroduodenoscopy, flexible,                         transoral; with transendoscopic balloon dilation of  esophagus (less than 30 mm diameter) Diagnosis Code(s):     --- Professional ---                        R13.10, Dysphagia, unspecified                        K22.2, Esophageal obstruction CPT copyright 2022 American Medical Association. All rights reserved. The codes documented in this report are preliminary and upon coder review may  be revised to meet current compliance requirements. [name] MD, MD 07/07/2022 8:39:05 AM This report has been signed electronically. Number of Addenda: 0 Note  Initiated On: 07/07/2022 8:25 AM Total Procedure Duration: 0 hours 5 minutes 31 seconds  Estimated Blood Loss:  Estimated blood loss: none.      [facility]

## 2022-07-07 NOTE — Anesthesia Postprocedure Evaluation (Signed)
Anesthesia Post Note  Patient: [name].  Procedure(s) Performed: COLONOSCOPY WITH PROPOFOLwith polyopectomy ESOPHAGOGASTRODUODENOSCOPY (EGD) WITH PROPOFOL  Patient location during evaluation: PACU Anesthesia Type: General Level of consciousness: awake and alert Pain management: pain level controlled Vital Signs Assessment: post-procedure vital signs reviewed and stable Respiratory status: spontaneous breathing, nonlabored ventilation, respiratory function stable and patient connected to nasal cannula oxygen Cardiovascular status: blood pressure returned to baseline and stable Postop Assessment: no apparent nausea or vomiting Anesthetic complications: no   No notable events documented.   Last Vitals:  Vitals:   07/07/22 0857 07/07/22 0907  BP: (!) 102/57 113/69  Pulse: (!) 51 (!) 56  Resp: 16 13  Temp: 36.8 C 36.7 C  SpO2: 96% 100%    Last Pain:  Vitals:   07/07/22 0907  TempSrc:   PainSc: 0-No pain                 Marisue Humble

## 2022-07-07 NOTE — Transfer of Care (Signed)
Immediate Anesthesia Transfer of Care Note  Patient: [name].  Procedure(s) Performed: COLONOSCOPY WITH PROPOFOLwith polyopectomy ESOPHAGOGASTRODUODENOSCOPY (EGD) WITH PROPOFOL  Patient Location: PACU  Anesthesia Type: General  Level of Consciousness: awake, alert  and patient cooperative  Airway and Oxygen Therapy: Patient Spontanous Breathing and Patient connected to supplemental oxygen  Post-op Assessment: Post-op Vital signs reviewed, Patient's Cardiovascular Status Stable, Respiratory Function Stable, Patent Airway and No signs of Nausea or vomiting  Post-op Vital Signs: Reviewed and stable  Complications: No notable events documented.

## 2022-07-07 NOTE — H&P (Signed)
Tyler Minium, MD [facility] [address] [place], [place] [address] Phone:[phone] Fax : [phone]  Primary Care Physician:  Tyler Luis, MD Primary Gastroenterologist:  Dr. Servando Ayala  Pre-Procedure History & Physical: HPI:  [name]. is a 67 y.o. male is here for an endoscopy and colonoscopy.   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 CHADS2VASc => 0; b. on Eliquis for pending ablation     Past Surgical History:  Procedure Laterality Date   ATRIAL FLUTTER ABLATION  02/17/2016   COLONOSCOPY  2014   COLONOSCOPY WITH PROPOFOL N/A 06/30/2016   Procedure: COLONOSCOPY WITH PROPOFOL;  Surgeon: Tyler Minium, MD;  Location: [facility];  Service: Endoscopy;  Laterality: N/A;   ELECTROPHYSIOLOGIC STUDY N/A 01/04/2016   Procedure: Cardioversion;  Surgeon: Tyler Ouch, MD;  Location: [facility];  Service: Cardiovascular;  Laterality: N/A;   ELECTROPHYSIOLOGIC STUDY N/A 02/17/2016   Procedure: A-Flutter Ablation;  Surgeon: Tyler Salvia, MD;  Location: [facility] INVASIVE CV LAB;  Service: Cardiovascular;  Laterality: N/A;   GANGLION CYST EXCISION Right 1994    wrist & back   HERNIA REPAIR Right 1994   abdominal repair with mesh   NASAL SEPTUM SURGERY  2011   Dr. Willeen Ayala     Prior to Admission medications   Medication Sig Start Date End Date Taking? Authorizing Provider  atorvastatin (LIPITOR) 40 MG tablet Take 1 tablet (40 mg total) by mouth every morning. 09/22/21  Yes Tyler Luis, MD  finasteride (PROSCAR) 5 MG tablet Take 1 tablet (5 mg total) by mouth daily. 09/05/21 08/31/22 Yes Ayala, Tyler Herter A, [name]  fluticasone-salmeterol (ADVAIR HFA) 115-21 MCG/ACT inhaler Inhale 2 puffs into the lungs 2 (two) times daily. 01/14/22  Yes Tyler Luis, MD  losartan (COZAAR) 50 MG tablet Take 1 tablet (50 mg total) by mouth daily. 06/24/22  Yes Tyler Luis, MD  Multiple Vitamin (MULTIVITAMIN) tablet Take 1 tablet by mouth daily.   Yes [provider]  pantoprazole (PROTONIX) 40 MG tablet TAKE 1 TABLET BY MOUTH DAILY 02/23/22  Yes Tyler Luis, MD  Plecanatide (TRULANCE) 3 MG TABS Take 1 tablet (3 mg total) by mouth 1 day or 1 dose for 1 dose. 07/07/22 07/08/22 Yes Tyler Minium, MD  senna (SENOKOT) 8.6 MG tablet Take 2 tablets by mouth daily with supper.   Yes [provider]  tadalafil (CIALIS) 5 MG tablet TAKE ONE TABLET BY MOUTH DAILY 04/14/22  Yes Ayala, Tyler A, [name]  VITAMIN D PO Take by mouth daily.   Yes [provider]  albuterol (VENTOLIN HFA) 108 (90 Base) MCG/ACT inhaler Inhale 1-2 puffs into the lungs every 6 (six) hours as needed. Patient not taking: Reported on 07/05/2022 01/07/22   Tyler Bail, NP  ALPRAZolam Prudy Feeler) 0.5 MG tablet TAKE 1 TABLET BY MOUTH EVERY NIGHT AT BEDTIME AS NEEDED FOR ANXIETY Patient not taking: Reported on 07/05/2022 03/19/21   Worthy Rancher B, FNP  cyclobenzaprine (FLEXERIL) 10 MG tablet TAKE 1 TABLET BY MOUTH 3 TIMES  DAILY AS NEEDED FOR MUSCLE  SPASM(S) Patient not taking: Reported on 07/05/2022 10/20/21   Eulis Foster, FNP  doxycycline (VIBRAMYCIN) 50 MG capsule Take 1  capsule (50 mg total) by mouth 2 (two) times daily. Patient not taking: Reported on 06/23/2022 03/03/22   Deirdre Evener, MD  meloxicam (MOBIC) 15 MG tablet TAKE 1 TABLET BY MOUTH  DAILY AS NEEDED FOR PAIN Patient not taking: Reported on 07/05/2022 03/03/21   Eulis Foster, FNP  tiotropium (SPIRIVA HANDIHALER) 18 MCG inhalation capsule Place 1 capsule (18 mcg total) into inhaler and inhale daily. Patient not taking: Reported on 07/05/2022 09/22/21   Tyler Luis, MD    Allergies as of 07/05/2022 - Review Complete 07/05/2022  Allergen Reaction Noted   Other Other (See Comments) 06/16/2011    Family History  Problem Relation Age of Onset   Hyperlipidemia Mother    Cancer Mother        skin cancer?   Heart disease Mother 34       MI - died in her sleep   Hyperlipidemia Father    Heart disease Father    Kidney disease Neg Hx    Prostate cancer Neg Hx    Kidney cancer Neg Hx    Bladder Cancer Neg Hx     Social History   Socioeconomic History   Marital status: Married    Spouse name: [name]   Number of children: 1   Years of education: GED   Highest education level: Not on file  Occupational History   Occupation: Disability    Comment: Herniated disk, spinal stenosis  Tobacco Use   Smoking status: Former    Packs/day: 1.00    Years: 20.00    Additional pack years: 0.00    Total pack years: 20.00    Types: Cigarettes    Quit date: 02/08/1991    Years since quitting: 31.4   Smokeless tobacco: Never  Vaping Use   Vaping Use: Never used  Substance and Sexual Activity   Alcohol use: No    Alcohol/week: 0.0 standard drinks of alcohol   Drug use: No   Sexual activity: Yes    Partners: Female  Other Topics Concern   Not on file  Social History Narrative   [name] was born and raised in [place], [place]. He quit school after the 8th grade because of financial hardship. He was working since age 18. He went back and got his GED. He moved to [place] in 2006. He lives at  home with his wife of 12 years and their 27 year old daughter. Previously, [name] was married to his first wife for 20 years and they divorced. They had no children.  [name] has been disabled 2/2 back problems.     Social Determinants of Health   Financial Resource Strain: Low Risk  (07/07/2021)   Overall Financial Resource Strain (CARDIA)    Difficulty of  Paying Living Expenses: Not hard at all  Food Insecurity: No Food Insecurity (07/07/2021)   Hunger Vital Sign    Worried About Running Out of Food in the Last Year: Never true    Ran Out of Food in the Last Year: Never true  Transportation Needs: No Transportation Needs (07/07/2021)   PRAPARE - Administrator, Civil Service (Medical): No    Lack of Transportation (Non-Medical): No  Physical Activity: Not on file  Stress: No Stress Concern Present (07/07/2021)   [employer] of Occupational Health - Occupational Stress Questionnaire    Feeling of Stress : Not at all  Social Connections: Unknown (07/07/2021)   Social Connection and Isolation Panel [NHANES]    Frequency of Communication with Friends and Family: More than three times a week    Frequency of Social Gatherings with Friends and Family: Not on file    Attends Religious Services: Not on file    Active Member of Clubs or Organizations: Not on file    Attends Banker Meetings: Not on file    Marital Status: Married  Intimate Partner Violence: Not At Risk (07/07/2021)   Humiliation, Afraid, Rape, and Kick questionnaire    Fear of Current or Ex-Partner: No    Emotionally Abused: No    Physically Abused: No    Sexually Abused: No    Review of Systems: See HPI, otherwise negative ROS  Physical Exam: BP 139/82   Pulse (!) 56   Temp 98.6 F (37 C) (Temporal)   Resp 10   Ht 5\' 9"  (1.753 m)   Wt 88 kg   SpO2 100%   BMI 28.65 kg/m  General:   Alert,  pleasant and cooperative in NAD Head:  Normocephalic and atraumatic. Neck:  Supple; no masses or  thyromegaly. Lungs:  Clear throughout to auscultation.    Heart:  Regular rate and rhythm. Abdomen:  Soft, nontender and nondistended. Normal bowel sounds, without guarding, and without rebound.   Neurologic:  Alert and  oriented x4;  grossly normal neurologically.  Impression/Plan: Tyler Snow. is here for an endoscopy and colonoscopy to be performed for Dyspepsia and a history of adenomatous polyps on 2018   Risks, benefits, limitations, and alternatives regarding  endoscopy and colonoscopy have been reviewed with the patient.  Questions have been answered.  All parties agreeable.   Tyler Minium, MD  07/07/2022, 8:08 AM

## 2022-07-08 ENCOUNTER — Encounter: Payer: Self-pay | Admitting: Gastroenterology

## 2022-07-15 LAB — ANATOMIC PATHOLOGY REPORT

## 2022-07-16 ENCOUNTER — Other Ambulatory Visit: Payer: Self-pay | Admitting: Urology

## 2022-07-16 ENCOUNTER — Other Ambulatory Visit: Payer: Self-pay | Admitting: Family Medicine

## 2022-07-16 DIAGNOSIS — R972 Elevated prostate specific antigen [PSA]: Secondary | ICD-10-CM

## 2022-07-16 DIAGNOSIS — E782 Mixed hyperlipidemia: Secondary | ICD-10-CM

## 2022-07-18 ENCOUNTER — Encounter: Payer: Self-pay | Admitting: Gastroenterology

## 2022-08-26 ENCOUNTER — Other Ambulatory Visit: Payer: Self-pay | Admitting: Family Medicine

## 2022-08-26 DIAGNOSIS — L719 Rosacea, unspecified: Secondary | ICD-10-CM

## 2022-08-26 NOTE — Telephone Encounter (Signed)
Previously filled by Dr. Gwen Pounds. Is it okay to refuse refill?

## 2022-08-29 NOTE — Telephone Encounter (Signed)
Refill request should be sent to Dermatology.

## 2022-09-28 DIAGNOSIS — Z23 Encounter for immunization: Secondary | ICD-10-CM | POA: Diagnosis not present

## 2022-09-28 NOTE — Progress Notes (Signed)
10:33 AM   [name]. [birth year] [id number]  Referring provider: Glori Luis, MD [address] [place],  [place] [address]  Urological history: 1. BPH with LU TS -PSA (03/2022) 1.1 -tadalafil 5 mg daily and finasteride 5 mg daily  2. Nocturia -Risk factors for nocturia: obstructive sleep apnea (untreated), hypertension, arthritis, COPD, depression and BPH -Myrbetriq cost prohibitive  3. Erectile dysfunction -contributing factors of age, BPH, HTN, HLD, sleep apnea and depression -not sexually active  4. Epididymal cyst -scrotal ultrasound 2015 3 mm left epididymal cyst   5. Renal cyst -contrast CT 2018 -  3.2 cm right mid pole renal cyst  Chief Complaint  Patient presents with   Follow-up     HPI: Tyler Ayalais a 67 y.o. male who presents today for discussion about treatment for cysts.    Previous records reviewed.   He has been having more pain with his left epididymal cyst.  He is wondering if he would be a surgical candidate.  He also states he has noticed that it is enlarging.  He is also noting excessive urination.  He he is going 13-15 times during the day.  He also has nocturia x 3.  Patient denies any modifying or aggravating factors.  Patient denies any recent UTI's, gross hematuria, dysuria or suprapubic/flank pain.  Patient denies any fevers, chills, nausea or vomiting.   I PSS 16/3  PVR 24 mL   UA yellow clear, specific gravity 1.015, pH 7.0, 0-5 WBCs and no RBCs.    IPSS     Row Name 09/29/22 0900         International Prostate Symptom Score   How often have you had the sensation of not emptying your bladder? Less than half the time     How often have you had to urinate less than every two hours? About half the time     How often have you found you stopped and started again several times when you urinated? Less than half the time     How often have you found it difficult to postpone urination? Less than half the  time     How often have you had a weak urinary stream? Less than half the time     How often have you had to strain to start urination? Less than half the time     How many times did you typically get up at night to urinate? 3 Times     Total IPSS Score 16       Quality of Life due to urinary symptoms   If you were to spend the rest of your life with your urinary condition just the way it is now how would you feel about that? Mixed                Score:  1-7 Mild 8-19 Moderate 20-35 Severe    PMH: Past Medical History:  Diagnosis Date   Anxiety    Arthritis    BPH (benign prostatic hyperplasia) 11/19/2013   Bulge of lumbar disc without myelopathy    L2/3 through L5/6   Bulging disc    C2/3, C3/4, C6/7   Cervical disc herniation    C4/5 and C5/6   Claustrophobia    Colon polyp 04/18/2011   Constipation 08/31/2012   COPD (chronic obstructive pulmonary disease) (HCC)    Depression    Diverticulosis    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/2012  GERD (gastroesophageal reflux disease)    H/O diverticulitis of colon    Hemorrhoid    Hiatal hernia    Hx of atrial flutter    Hyperlipidemia    Levoscoliosis    Motion sickness    boats, planes   Sleep apnea    No CPAP   Sleep difficulties    Spinal stenosis in cervical region    cord abutment C4/5   Testicular pain, left    Typical atrial flutter (HCC) 11/2015   a. CHADS2VASc => 0; b. on Eliquis for pending ablation     Surgical History: Past Surgical History:  Procedure Laterality Date   ATRIAL FLUTTER ABLATION  02/17/2016   COLONOSCOPY  2014   COLONOSCOPY WITH PROPOFOL N/A 06/30/2016   Procedure: COLONOSCOPY WITH PROPOFOL;  Surgeon: [name], MD;  Location: [facility];  Service: Endoscopy;  Laterality: N/A;   COLONOSCOPY WITH PROPOFOL N/A 07/07/2022   Procedure: COLONOSCOPY WITH PROPOFOLwith polyopectomy;  Surgeon: [name], MD;  Location: [facility];  Service:  Endoscopy;  Laterality: N/A;   ELECTROPHYSIOLOGIC STUDY N/A 01/04/2016   Procedure: Cardioversion;  Surgeon: [name], MD;  Location: [facility];  Service: Cardiovascular;  Laterality: N/A;   ELECTROPHYSIOLOGIC STUDY N/A 02/17/2016   Procedure: A-Flutter Ablation;  Surgeon: [name], MD;  Location: [facility] IN[facility]AS[facility] CV LAB;  Service: Cardiovascular;  Laterality: N/A;   ESOPHAGOGASTRODUODENOSCOPY (EGD) WITH PROPOFOL N/A 07/07/2022   Procedure: ESOPHAGOGASTRODUODENOSCOPY (EGD) WITH PROPOFOL;  Surgeon: [name], MD;  Location: [facility];  Service: Endoscopy;  Laterality: N/A;  balloon dilation 18mm   GANGLION CYST EXCISION Right 1994   wrist & back   HERNIA REPAIR Right 1994   abdominal repair with mesh   NASAL SEPTUM SURGERY  2011   Dr. Willeen Cass     Home Medications:  Allergies as of 09/29/2022       Reactions   Other Other (See Comments)   Pollen:  Sinus infection, cough, fatigue        Medication List        Accurate as of September 29, 2022 10:33 AM. If you have any questions, ask your nurse or doctor.          STOP taking these medications    ALPRAZolam 0.5 MG tablet Commonly known as: XANAX Stopped by: Michiel Cowboy   cyclobenzaprine 10 MG tablet Commonly known as: FLEXERIL Stopped by: Natallie Ravenscroft   doxycycline 50 MG capsule Commonly known as: VIBRAMYCIN Stopped by: Chloris Marcoux   meloxicam 15 MG tablet Commonly known as: MOBIC Stopped by: Keia Rask   Spiriva HandiHaler 18 MCG inhalation capsule Generic drug: tiotropium Stopped by: Jsoeph Podesta       TAKE these medications    albuterol 108 (90 Base) MCG/ACT inhaler Commonly known as: VENTOLIN HFA Inhale 1-2 puffs into the lungs every 6 (six) hours as needed.   atorvastatin 40 MG tablet Commonly known as: LIPITOR TAKE 1 TABLET BY MOUTH EVERY  MORNING   finasteride 5 MG tablet Commonly known as: PROSCAR TAKE 1 TABLET BY MOUTH DAILY   fluticasone-salmeterol  115-21 MCG/ACT inhaler Commonly known as: Advair HFA Inhale 2 puffs into the lungs 2 (two) times daily.   Gemtesa 75 MG Tabs Generic drug: Vibegron Take 1 tablet (75 mg total) by mouth daily. Started by: Michiel Cowboy   losartan 50 MG tablet Commonly known as: COZAAR Take 1 tablet (50 mg total) by mouth daily.   multivitamin tablet Take 1 tablet by mouth daily.  pantoprazole 40 MG tablet Commonly known as: PROTONIX TAKE 1 TABLET BY MOUTH DAILY   senna 8.6 MG tablet Commonly known as: SENOKOT Take 2 tablets by mouth daily with supper.   tadalafil 5 MG tablet Commonly known as: CIALIS TAKE ONE TABLET BY MOUTH DAILY   VITAMIN D PO Take by mouth daily.        Allergies:  Allergies  Allergen Reactions   Other Other (See Comments)    Pollen:  Sinus infection, cough, fatigue    Family History: Family History  Problem Relation Age of Onset   Hyperlipidemia Mother    Cancer Mother        skin cancer?   Heart disease Mother 62       MI - died in her sleep   Hyperlipidemia Father    Heart disease Father    Kidney disease Neg Hx    Prostate cancer Neg Hx    Kidney cancer Neg Hx    Bladder Cancer Neg Hx     Social History:  reports that he quit smoking about 31 years ago. His smoking use included cigarettes. He started smoking about 51 years ago. He has a 20 pack-year smoking history. He has never used smokeless tobacco. He reports that he does not drink alcohol and does not use drugs.  ROS: For pertinent review of systems please refer to history of present illness  Physical Exam: BP 111/70   Pulse 82   Ht 5\' 9"  (1.753 m)   Wt 194 lb (88 kg)   BMI 28.65 kg/m   Constitutional:  Well nourished. Alert and oriented, No acute distress. HEENT: [place] AT, moist mucus membranes.  Trachea midline Cardiovascular: No clubbing, cyanosis, or edema. Respiratory: Normal respiratory effort, no increased work of breathing. Neurologic: Grossly intact, no focal deficits,  moving all 4 extremities. Psychiatric: Normal mood and affect.   Laboratory Data: Urinalysis Component     Latest Ref Rng 09/29/2022  Specific Gravity, UA     1.005 - 1.030  1.015   pH, UA     5.0 - 7.5  7.0   Color, UA     Yellow  Yellow   Appearance Ur     Clear  Clear   Leukocytes,UA     Negative  Negative   Protein,UA     Negative/Trace  Negative   Glucose, UA     Negative  Negative   Ketones, UA     Negative  Negative   RBC, UA     Negative  Negative   Bilirubin, UA     Negative  Negative   Urobilinogen, Ur     0.2 - 1.0 mg/dL 1.0   Nitrite, UA     Negative  Negative   Microscopic Examination See below:     Component     Latest Ref Rng 09/29/2022  WBC, UA     0 - 5 /hpf 0-5   RBC, Urine     0 - 2 /hpf None seen   Epithelial Cells (non renal)     0 - 10 /hpf None seen   Bacteria, UA     None seen/Few  None seen   I have reviewed the labs.  Pertinent Imaging  09/29/22 09:54  Scan Result 24 ml    Assessment & Plan:    1. BPH with LUTS -PSA up-to-date -PVR demonstrates adequate emptying -most bothersome symptoms daytime urgency -continue conservative management, avoiding bladder irritants and timed voiding's -continue Cialis 5 mg daily  -  continue finasteride 5 mg daily    2. OAB -discussed that his symptoms are more inline with OAB vs BOO -Explained that a bladder outlet procedure would likely not address his issues and that we need to try another OAB medication, which he is agreeable -I gave him 6 weeks worth of the Gemtesa 75 mg daily I will have him return in 6 weeks for follow-up  3. ED -not sexually active  4. Epididymal cysts -Will obtain a scrotal ultrasound as it has been since 2015 since last imaging to evaluate for any possible changes -In regards to removing the cyst, I explained to him how the procedure is performed and what to expect postoperatively -He would like to defer any surgical removal of the cyst at this time, but he would  like a scrotal ultrasound  Return in about 6 weeks (around 11/10/2022) for scrotal ultrasound report, I PSS, PVR .  These notes generated with voice recognition software. I apologize for typographical errors.  Cloretta Ned  [facility] [address] [place], [place] [address] [phone]

## 2022-09-29 ENCOUNTER — Ambulatory Visit: Payer: BC Managed Care – PPO | Admitting: Gastroenterology

## 2022-09-29 ENCOUNTER — Ambulatory Visit (INDEPENDENT_AMBULATORY_CARE_PROVIDER_SITE_OTHER): Payer: Medicare Other | Admitting: Urology

## 2022-09-29 ENCOUNTER — Encounter: Payer: Self-pay | Admitting: Urology

## 2022-09-29 VITALS — BP 111/70 | HR 82 | Ht 69.0 in | Wt 194.0 lb

## 2022-09-29 DIAGNOSIS — R351 Nocturia: Secondary | ICD-10-CM

## 2022-09-29 DIAGNOSIS — N529 Male erectile dysfunction, unspecified: Secondary | ICD-10-CM | POA: Diagnosis not present

## 2022-09-29 DIAGNOSIS — N503 Cyst of epididymis: Secondary | ICD-10-CM

## 2022-09-29 DIAGNOSIS — N401 Enlarged prostate with lower urinary tract symptoms: Secondary | ICD-10-CM

## 2022-09-29 LAB — URINALYSIS, COMPLETE
Bilirubin, UA: NEGATIVE
Glucose, UA: NEGATIVE
Ketones, UA: NEGATIVE
Leukocytes,UA: NEGATIVE
Nitrite, UA: NEGATIVE
Protein,UA: NEGATIVE
RBC, UA: NEGATIVE
Specific Gravity, UA: 1.015 (ref 1.005–1.030)
Urobilinogen, Ur: 1 mg/dL (ref 0.2–1.0)
pH, UA: 7 (ref 5.0–7.5)

## 2022-09-29 LAB — MICROSCOPIC EXAMINATION
Bacteria, UA: NONE SEEN
Epithelial Cells (non renal): NONE SEEN /hpf (ref 0–10)
RBC, Urine: NONE SEEN /hpf (ref 0–2)

## 2022-09-29 LAB — BLADDER SCAN AMB NON-IMAGING: Scan Result: 24

## 2022-09-29 MED ORDER — GEMTESA 75 MG PO TABS
75.0000 mg | ORAL_TABLET | Freq: Every day | ORAL | Status: DC
Start: 2022-09-29 — End: 2022-11-10

## 2022-10-04 ENCOUNTER — Ambulatory Visit
Admission: RE | Admit: 2022-10-04 | Discharge: 2022-10-04 | Disposition: A | Discharge status: Home / Self Care | Payer: Medicare Other | Source: Ambulatory Visit | Attending: Urology | Admitting: Urology

## 2022-10-04 DIAGNOSIS — N503 Cyst of epididymis: Secondary | ICD-10-CM | POA: Diagnosis not present

## 2022-10-04 DIAGNOSIS — N433 Hydrocele, unspecified: Secondary | ICD-10-CM | POA: Diagnosis not present

## 2022-10-31 ENCOUNTER — Ambulatory Visit (INDEPENDENT_AMBULATORY_CARE_PROVIDER_SITE_OTHER): Payer: Medicare Other | Admitting: Family Medicine

## 2022-10-31 ENCOUNTER — Encounter: Payer: Self-pay | Admitting: Family Medicine

## 2022-10-31 VITALS — BP 118/78 | HR 60 | Temp 97.6°F | Ht 69.0 in | Wt 202.8 lb

## 2022-10-31 DIAGNOSIS — G4733 Obstructive sleep apnea (adult) (pediatric): Secondary | ICD-10-CM | POA: Diagnosis not present

## 2022-10-31 DIAGNOSIS — G4700 Insomnia, unspecified: Secondary | ICD-10-CM | POA: Diagnosis not present

## 2022-10-31 DIAGNOSIS — J449 Chronic obstructive pulmonary disease, unspecified: Secondary | ICD-10-CM

## 2022-10-31 MED ORDER — TRAZODONE HCL 50 MG PO TABS
25.0000 mg | ORAL_TABLET | Freq: Every evening | ORAL | 3 refills | Status: DC | PRN
Start: 2022-10-31 — End: 2023-02-09

## 2022-10-31 NOTE — Assessment & Plan Note (Signed)
Possibly related to sleep apnea or cough from COPD.  There could be some measure of anxiety given that he cannot get his brain to shut off at bedtime.  We will treat his COPD as outlined.  Will trial trazodone to see if this would help with sleep.  Advised to monitor for drowsiness the next day and prolonged erections.

## 2022-10-31 NOTE — Progress Notes (Signed)
Marikay Alar, MD Phone: [phone]  Tyler Seltzer Carron Nee. is a 67 y.o. male who presents today for same-day visit.  Insomnia: Patient notes trouble falling asleep and staying asleep.  He typically tries to go to bed at 9 PM and wakes up around 5 AM.  He does not drink alcohol.  No caffeinated beverages.  Does have some screen time the hour before bed.  Notes he has trouble having his brain shut off though does not think it is related to anxiety.  Notes depression has improved significantly.  Does have OSA though is not currently on treatment.  CPAP mask made him claustrophobic.  He tried a dental device that could not tolerate it.  He did get evaluated for the inspire procedure though he declined having that done.  He does have COPD and does at times cough at night.  He only uses his Advair 3-5 times a week.  Social History   Tobacco Use  Smoking Status Former   Current packs/day: 0.00   Average packs/day: 1 pack/day for 20.0 years (20.0 ttl pk-yrs)   Types: Cigarettes   Start date: 02/08/1971   Quit date: 02/08/1991   Years since quitting: 31.7  Smokeless Tobacco Never    Current Outpatient Medications on File Prior to Visit  Medication Sig Dispense Refill   albuterol (VENTOLIN HFA) 108 (90 Base) MCG/ACT inhaler Inhale 1-2 puffs into the lungs every 6 (six) hours as needed. 18 g 0   atorvastatin (LIPITOR) 40 MG tablet TAKE 1 TABLET BY MOUTH EVERY  MORNING 90 tablet 3   finasteride (PROSCAR) 5 MG tablet TAKE 1 TABLET BY MOUTH DAILY 90 tablet 3   fluticasone-salmeterol (ADVAIR HFA) 115-21 MCG/ACT inhaler Inhale 2 puffs into the lungs 2 (two) times daily. 1 each 12   losartan (COZAAR) 50 MG tablet Take 1 tablet (50 mg total) by mouth daily. 90 tablet 3   Multiple Vitamin (MULTIVITAMIN) tablet Take 1 tablet by mouth daily.     pantoprazole (PROTONIX) 40 MG tablet TAKE 1 TABLET BY MOUTH DAILY 90 tablet 3   senna (SENOKOT) 8.6 MG tablet Take 2 tablets by mouth daily with supper.     tadalafil  (CIALIS) 5 MG tablet TAKE ONE TABLET BY MOUTH DAILY 90 tablet 2   Vibegron (GEMTESA) 75 MG TABS Take 1 tablet (75 mg total) by mouth daily.     VITAMIN D PO Take by mouth daily.     No current facility-administered medications on file prior to visit.     ROS see history of present illness  Objective  Physical Exam Vitals:   10/31/22 0856  BP: 118/78  Pulse: 60  Temp: 97.6 F (36.4 C)  SpO2: 97%    BP Readings from Last 3 Encounters:  10/31/22 118/78  09/29/22 111/70  07/07/22 113/69   Wt Readings from Last 3 Encounters:  10/31/22 202 lb 12.8 oz (92 kg)  09/29/22 194 lb (88 kg)  07/07/22 194 lb (88 kg)    Physical Exam Constitutional:      General: He is not in acute distress.    Appearance: He is not diaphoretic.  Cardiovascular:     Rate and Rhythm: Normal rate and regular rhythm.     Heart sounds: Normal heart sounds.  Pulmonary:     Effort: Pulmonary effort is normal.     Breath sounds: Normal breath sounds.  Skin:    General: Skin is warm and dry.  Neurological:     Mental Status: He is alert.  Assessment/Plan: Please see individual problem list.  Insomnia, unspecified type Assessment & Plan: Possibly related to sleep apnea or cough from COPD.  There could be some measure of anxiety given that he cannot get his brain to shut off at bedtime.  We will treat his COPD as outlined.  Will trial trazodone to see if this would help with sleep.  Advised to monitor for drowsiness the next day and prolonged erections.  Orders: -     traZODone HCl; Take 0.5-1 tablets (25-50 mg total) by mouth at bedtime as needed for sleep.  Dispense: 30 tablet; Refill: 3  Chronic obstructive pulmonary disease, unspecified COPD type (HCC) Assessment & Plan: Chronic issue.  Suboptimally controlled.  He will use his Advair 2 puffs twice daily every day.  Discussed this is not an as needed medication.   Obstructive sleep apnea syndrome Assessment & Plan: Chronic issue.   Currently untreated.  Patient unable to tolerate CPAP mask or dental device.  He has declined inspire procedure.  Certainly this could be contributing to his fatigue though he declines treatment options at this time.     Return in about 3 months (around 01/30/2023) for Follow-up.   Marikay Alar, MD [facility]

## 2022-10-31 NOTE — Patient Instructions (Signed)
Nice to see you. Will try trazodone to see if that helps with your sleep. You should take your Advair on a daily basis. Of these things are not beneficial for you please let us know.

## 2022-10-31 NOTE — Assessment & Plan Note (Signed)
Chronic issue.  Suboptimally controlled.  He will use his Advair 2 puffs twice daily every day.  Discussed this is not an as needed medication.

## 2022-10-31 NOTE — Assessment & Plan Note (Addendum)
Chronic issue.  Currently untreated.  Patient unable to tolerate CPAP mask or dental device.  He has declined inspire procedure.  Certainly this could be contributing to his fatigue though he declines treatment options at this time.

## 2022-11-09 NOTE — Progress Notes (Unsigned)
9:15 AM   [name]. 01/06/1956 [id number]  Referring provider: Glori Luis, MD [address] [place],  [place] [address]  Urological history: 1. BPH with LU TS -PSA (03/2022) 1.1 -tadalafil 5 mg daily and finasteride 5 mg daily  2. Nocturia -Risk factors for nocturia: obstructive sleep apnea (untreated), hypertension, arthritis, COPD, depression and BPH -Myrbetriq cost prohibitive  3. Erectile dysfunction -contributing factors of age, BPH, HTN, HLD, sleep apnea and depression -not sexually active  4. Epididymal cyst -scrotal ultrasound 2015 3 mm left epididymal cyst   5. Renal cyst -contrast CT 2018 -  3.2 cm right mid pole renal cyst  Chief Complaint  Patient presents with   Follow-up     HPI: Tyler Ayalais a 67 y.o. male who presents today for 6-week follow-up.  Previous records reviewed.   Scrotal ultrasound performed on October 04, 2022 noted 5 mm epididymal head cysts bilaterally, bilateral hydroceles with left being greater than right.  He states that [place] made some difference.  He felt that it was causing more improvement these last 2 weeks.  He feels like it is reduced his trips to the bathroom by half.  He does not drink water.  He drinks coffee, half-and-half tea and soda.  Patient denies any modifying or aggravating factors.  Patient denies any recent UTI's, gross hematuria, dysuria or suprapubic/flank pain.  Patient denies any fevers, chills, nausea or vomiting.    PVR demonstrates adequate emptying   IPSS     Row Name 11/10/22 0800         International Prostate Symptom Score   How often have you had the sensation of not emptying your bladder? Less than half the time     How often have you had to urinate less than every two hours? About half the time     How often have you found you stopped and started again several times when you urinated? Less than half the time     How often have you found it difficult to  postpone urination? Less than half the time     How often have you had a weak urinary stream? Less than half the time     How often have you had to strain to start urination? Less than half the time     How many times did you typically get up at night to urinate? 2 Times     Total IPSS Score 15       Quality of Life due to urinary symptoms   If you were to spend the rest of your life with your urinary condition just the way it is now how would you feel about that? Mixed             Score:  1-7 Mild 8-19 Moderate 20-35 Severe    PMH: Past Medical History:  Diagnosis Date   Anxiety    Arthritis    BPH (benign prostatic hyperplasia) 11/19/2013   Bulge of lumbar disc without myelopathy    L2/3 through L5/6   Bulging disc    C2/3, C3/4, C6/7   Cervical disc herniation    C4/5 and C5/6   Claustrophobia    Tyler polyp 04/18/2011   Constipation 08/31/2012   COPD (chronic obstructive pulmonary disease) (HCC)    Depression    Diverticulosis    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/2012   GERD (gastroesophageal reflux disease)    H/O diverticulitis of  Tyler    Hemorrhoid    Hiatal hernia    Hx of atrial flutter    Hyperlipidemia    Levoscoliosis    Motion sickness    boats, planes   Sleep apnea    No CPAP   Sleep difficulties    Spinal stenosis in cervical region    cord abutment C4/5   Testicular pain, left    Typical atrial flutter (HCC) 11/2015   a. CHADS2VASc => 0; b. on Eliquis for pending ablation     Surgical History: Past Surgical History:  Procedure Laterality Date   ATRIAL FLUTTER ABLATION  02/17/2016   COLONOSCOPY  2014   COLONOSCOPY WITH PROPOFOL N/A 06/30/2016   Procedure: COLONOSCOPY WITH PROPOFOL;  Surgeon: Tyler Minium, MD;  Location: [facility];  Service: Endoscopy;  Laterality: N/A;   COLONOSCOPY WITH PROPOFOL N/A 07/07/2022   Procedure: COLONOSCOPY WITH PROPOFOLwith polyopectomy;  Surgeon: Tyler Minium, MD;  Location:  [facility];  Service: Endoscopy;  Laterality: N/A;   ELECTROPHYSIOLOGIC STUDY N/A 01/04/2016   Procedure: Cardioversion;  Surgeon: Tyler Ouch, MD;  Location: [facility];  Service: Cardiovascular;  Laterality: N/A;   ELECTROPHYSIOLOGIC STUDY N/A 02/17/2016   Procedure: A-Flutter Ablation;  Surgeon: Tyler Salvia, MD;  Location: [facility] IN[facility]ASIVE CV LAB;  Service: Cardiovascular;  Laterality: N/A;   ESOPHAGOGASTRODUODENOSCOPY (EGD) WITH PROPOFOL N/A 07/07/2022   Procedure: ESOPHAGOGASTRODUODENOSCOPY (EGD) WITH PROPOFOL;  Surgeon: Tyler Minium, MD;  Location: [facility];  Service: Endoscopy;  Laterality: N/A;  balloon dilation 18mm   GANGLION CYST EXCISION Right 1994   wrist & back   HERNIA REPAIR Right 1994   abdominal repair with mesh   NASAL SEPTUM SURGERY  2011   Dr. Willeen Ayala     Home Medications:  Allergies as of 11/10/2022       Reactions   Other Other (See Comments)   Pollen:  Sinus infection, cough, fatigue        Medication List        Accurate as of November 10, 2022  9:15 AM. If you have any questions, ask your nurse or doctor.          albuterol 108 (90 Base) MCG/ACT inhaler Commonly known as: VENTOLIN HFA Inhale 1-2 puffs into the lungs every 6 (six) hours as needed.   atorvastatin 40 MG tablet Commonly known as: LIPITOR TAKE 1 TABLET BY MOUTH EVERY  MORNING   finasteride 5 MG tablet Commonly known as: PROSCAR TAKE 1 TABLET BY MOUTH DAILY   fluticasone-salmeterol 115-21 MCG/ACT inhaler Commonly known as: Advair HFA Inhale 2 puffs into the lungs 2 (two) times daily.   Gemtesa 75 MG Tabs Generic drug: Vibegron Take 1 tablet (75 mg total) by mouth daily. Started by: [name]   losartan 50 MG tablet Commonly known as: COZAAR Take 1 tablet (50 mg total) by mouth daily.   multivitamin tablet Take 1 tablet by mouth daily.   pantoprazole 40 MG tablet Commonly known as: PROTONIX TAKE 1 TABLET BY MOUTH DAILY   senna 8.6 MG  tablet Commonly known as: SENOKOT Take 2 tablets by mouth daily with supper.   tadalafil 5 MG tablet Commonly known as: CIALIS TAKE ONE TABLET BY MOUTH DAILY   traZODone 50 MG tablet Commonly known as: DESYREL Take 0.5-1 tablets (25-50 mg total) by mouth at bedtime as needed for sleep.   VITAMIN D PO Take by mouth daily.        Allergies:  Allergies  Allergen Reactions  Other Other (See Comments)    Pollen:  Sinus infection, cough, fatigue    Family History: Family History  Problem Relation Age of Onset   Hyperlipidemia Mother    Cancer Mother        skin cancer?   Heart disease Mother 42       MI - died in her sleep   Hyperlipidemia Father    Heart disease Father    Kidney disease Neg Hx    Prostate cancer Neg Hx    Kidney cancer Neg Hx    Bladder Cancer Neg Hx     Social History:  reports that he quit smoking about 31 years ago. His smoking use included cigarettes. He started smoking about 51 years ago. He has a 20 pack-year smoking history. He has never used smokeless tobacco. He reports that he does not drink alcohol and does not use drugs.  ROS: For pertinent review of systems please refer to history of present illness  Physical Exam: BP 132/77   Pulse 65   Constitutional:  Well nourished. Alert and oriented, No acute distress. HEENT: [place] AT, moist mucus membranes.  Trachea midline Cardiovascular: No clubbing, cyanosis, or edema. Respiratory: Normal respiratory effort, no increased work of breathing. Neurologic: Grossly intact, no focal deficits, moving all 4 extremities. Psychiatric: Normal mood and affect.   Laboratory Data: N/A  I have reviewed the labs.  Pertinent Imaging  11/10/22 08:47  Scan Result 50 ml    Assessment & Plan:    1. BPH with LUTS -PSA up-to-date -PVR demonstrates adequate emptying -most bothersome symptoms daytime urgency -continue conservative management, avoiding bladder irritants and timed voiding's -continue  Cialis 5 mg daily  -continue finasteride 5 mg daily    2. OAB -discussed that his symptoms are more inline with OAB vs BOO -Explained that you need to be on 3 months of OAB medications to determine whether you have failed therapy or it is effective -I sent a prescription in for the Gemtesa to his mail order pharmacy, it is likely that his insurance will not cover the medication -I will give him Gemtesa samples to bridge the gap and suggested he visit the [facility] [employer] if his insurance will not cover the medication -He will reach out if he has issues with the Gemtesa -I also offered a prescription of [place], but after discussing the side effects, he deferred  3. ED -not sexually active  4. Epididymal cysts -5 mm bilateral epididymal cysts -Manage conservatively  5. Bilateral hydrocele -Explained to the patient that a hydrocele is a collection of peritoneal fluid between the parietal and visceral layers of the tunica vaginalis, which directly surrounds the testis and spermatic cord.  It is the same layer that forms the peritoneal lining of the abdomen. 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 Inflammatory conditions of the scrotal contents (epididymitis, torsion, appendiceal torsion) can produce an acute reactive hydrocele, which often resolves with treatment of the underlying condition. His hydrocele does not require intervention at this time.  These notes generated with voice recognition software. I apologize for typographical errors.  Cloretta Ned  [facility] [address] [place], [place] [address] [phone]

## 2022-11-10 ENCOUNTER — Encounter: Payer: Self-pay | Admitting: Urology

## 2022-11-10 ENCOUNTER — Ambulatory Visit: Payer: Medicare Other | Admitting: Urology

## 2022-11-10 VITALS — BP 132/77 | HR 65

## 2022-11-10 DIAGNOSIS — N432 Other hydrocele: Secondary | ICD-10-CM

## 2022-11-10 DIAGNOSIS — N503 Cyst of epididymis: Secondary | ICD-10-CM

## 2022-11-10 DIAGNOSIS — N401 Enlarged prostate with lower urinary tract symptoms: Secondary | ICD-10-CM

## 2022-11-10 DIAGNOSIS — N529 Male erectile dysfunction, unspecified: Secondary | ICD-10-CM | POA: Diagnosis not present

## 2022-11-10 DIAGNOSIS — N3281 Overactive bladder: Secondary | ICD-10-CM | POA: Diagnosis not present

## 2022-11-10 LAB — BLADDER SCAN AMB NON-IMAGING: Scan Result: 50

## 2022-11-10 MED ORDER — GEMTESA 75 MG PO TABS
75.0000 mg | ORAL_TABLET | Freq: Every day | ORAL | 3 refills | Status: DC
Start: 2022-11-10 — End: 2023-07-11

## 2022-11-25 ENCOUNTER — Telehealth: Payer: Self-pay | Admitting: Family Medicine

## 2022-11-25 NOTE — Telephone Encounter (Signed)
Patient is aware that he does not meet criteria for a bone density at this time.

## 2022-11-25 NOTE — Telephone Encounter (Signed)
Patient dropped off a parking placard form. Form is up front in color folder.

## 2022-11-25 NOTE — Telephone Encounter (Signed)
Why does he need this? Can he walk 200 feet with out having to stop? Does he use a cane or walker?

## 2022-11-25 NOTE — Telephone Encounter (Signed)
Patient states he can not walk 200 feet without stopping. Patient states he has herniated disks, sleep apnea and COPD. Patient confirmed that he uses a cane sometimes and he gets SOB when walking. Patient states you filled this out 4 years ago for him for the same reasons.

## 2022-11-25 NOTE — Telephone Encounter (Signed)
I have the parking placard to fill out then will pass it to Dr. Birdie Sons to be signed

## 2022-11-25 NOTE — Telephone Encounter (Signed)
It does not look like he meets criteria for a bone density scan.

## 2022-11-25 NOTE — Telephone Encounter (Signed)
Filled parking placard out and placed in Dr. Purvis Sheffield needs to be signed basket.

## 2022-11-25 NOTE — Telephone Encounter (Signed)
Patient wanted to know if he needed a Bone Density test. If so could he have a referral.

## 2022-11-28 NOTE — Telephone Encounter (Signed)
Made a copy to send to Patient's medical record.

## 2022-11-28 NOTE — Telephone Encounter (Signed)
Placed up front and let Patient know it is ready for pick up.

## 2022-11-28 NOTE — Telephone Encounter (Signed)
Noted.  Form signed.  Please make available for pickup.

## 2022-12-19 ENCOUNTER — Ambulatory Visit
Admission: RE | Admit: 2022-12-19 | Discharge: 2022-12-19 | Disposition: A | Discharge status: Home / Self Care | Payer: Medicare Other | Source: Ambulatory Visit | Attending: Internal Medicine | Admitting: Internal Medicine

## 2022-12-19 ENCOUNTER — Ambulatory Visit (INDEPENDENT_AMBULATORY_CARE_PROVIDER_SITE_OTHER): Payer: Medicare Other | Admitting: Internal Medicine

## 2022-12-19 ENCOUNTER — Encounter: Payer: Self-pay | Admitting: Internal Medicine

## 2022-12-19 VITALS — BP 138/88 | HR 82 | Temp 97.8°F | Ht 69.0 in | Wt 202.0 lb

## 2022-12-19 DIAGNOSIS — G4733 Obstructive sleep apnea (adult) (pediatric): Secondary | ICD-10-CM | POA: Diagnosis not present

## 2022-12-19 DIAGNOSIS — J441 Chronic obstructive pulmonary disease with (acute) exacerbation: Secondary | ICD-10-CM | POA: Insufficient documentation

## 2022-12-19 DIAGNOSIS — R059 Cough, unspecified: Secondary | ICD-10-CM | POA: Diagnosis not present

## 2022-12-19 MED ORDER — PREDNISONE 20 MG PO TABS: 40.0000 mg | ORAL_TABLET | Freq: Every day | ORAL | 1 refills | Status: DC

## 2022-12-19 MED ORDER — AZITHROMYCIN 250 MG PO TABS: ORAL_TABLET | ORAL | 0 refills | Status: AC

## 2022-12-19 NOTE — Progress Notes (Addendum)
Referring provider: Glori Luis, MD   SYNOPSIS 67 year old male seen for pulmonary consult September 2019 for sleep apnea and cough Medical history significant for A. Fib previous ablation now with newly diagnosed chronic bronchitis and COPD   TEST/EVENTS :  --Sleep study 01/05/16, AHI equals 60. Pulmonary function testing 11/2020 Moderate COPD FEV1 59% predicted Ratio is decreased Air trapping and hyperinflation  CC  Follow up OSA severe sleep apnea AHI 60 Follow up Obstructive airways disease FEV1 59% predicted COPD  HPI Severe Sleep apnea based on AHI 60 Dx 2017 Had refused to wear CPAP Has tried oral device as well He has tried CPAP as well and noncompliant due to claustrophobia  high risk for cardiac failure arrhythmias stroke and death   Patient has had chronic cough/wheezing and mucus production Pulmonary function testing shows FEV1 59% predicted with reduced ratio along with hyperinflation and air trapping Reviewed with patient in detail  +exacerbation at this time No evidence of heart failure at this time No evidence or signs of infection at this time No respiratory distress No fevers, chills, nausea, vomiting, diarrhea No evidence of lower extremity edema No evidence hemoptysis  Patient with cough productive phlegm associated with wheezing and chest congestion Denies fevers     Allergies  Allergen Reactions   Other Other (See Comments)    Pollen:  Sinus infection, cough, fatigue    Immunization History  Administered Date(s) Administered   Fluad Quad(high Dose 65+) 10/09/2021   Influenza Whole 11/18/2010   Influenza,inj,Quad PF,6+ Mos 10/17/2012, 11/02/2013, 10/16/2014, 10/12/2016, 10/17/2017, 10/17/2018, 10/17/2019, 10/04/2020   Influenza-Unspecified 11/02/2011, 10/17/2012, 11/02/2013, 10/22/2015, 10/17/2019   PFIZER(Purple Top)SARS-COV-2 Vaccination 04/27/2019, 05/19/2019, 01/15/2020   Pneumococcal Polysaccharide-23 11/30/2020    Respiratory Syncytial Virus Vaccine,Recomb Aduvanted(Arexvy) 01/01/2022   Tdap 10/17/2012   Zoster Recombinant(Shingrix) 07/21/2020    Past Medical History:  Diagnosis Date   Anxiety    Arthritis    BPH (benign prostatic hyperplasia) 11/19/2013   Bulge of lumbar disc without myelopathy    L2/3 through L5/6   Bulging disc    C2/3, C3/4, C6/7   Cervical disc herniation    C4/5 and C5/6   Claustrophobia    Colon polyp 04/18/2011   Constipation 08/31/2012   COPD (chronic obstructive pulmonary disease) (HCC)    Depression    Diverticulosis    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 CHADS2VASc => 0; b. on Eliquis for pending ablation     Tobacco History: Social History   Tobacco Use  Smoking Status Former   Current packs/day: 0.00   Average packs/day: 1 pack/day for 20.0 years (20.0 ttl pk-yrs)   Types: Cigarettes   Start date: 02/08/1971   Quit date: 02/08/1991   Years since quitting: 31.8  Smokeless Tobacco Never   Counseling given: Not Answered    Outpatient Medications Prior to Visit  Medication Sig Dispense Refill   albuterol (VENTOLIN HFA) 108 (90 Base) MCG/ACT inhaler Inhale 1-2 puffs into the  lungs every 6 (six) hours as needed. 18 g 0   atorvastatin (LIPITOR) 40 MG tablet TAKE 1 TABLET BY MOUTH EVERY  MORNING 90 tablet 3   finasteride (PROSCAR) 5 MG tablet TAKE 1 TABLET BY MOUTH DAILY 90 tablet 3   fluticasone-salmeterol (ADVAIR HFA) 115-21 MCG/ACT inhaler Inhale 2 puffs into the lungs 2 (two) times daily. 1 each 12   losartan (COZAAR) 50 MG tablet Take 1 tablet (50 mg  total) by mouth daily. 90 tablet 3   Multiple Vitamin (MULTIVITAMIN) tablet Take 1 tablet by mouth daily.     pantoprazole (PROTONIX) 40 MG tablet TAKE 1 TABLET BY MOUTH DAILY 90 tablet 3   senna (SENOKOT) 8.6 MG tablet Take 2 tablets by mouth daily with supper.     tadalafil (CIALIS) 5 MG tablet TAKE ONE TABLET BY MOUTH DAILY 90 tablet 2   traZODone (DESYREL) 50 MG tablet Take 0.5-1 tablets (25-50 mg total) by mouth at bedtime as needed for sleep. 30 tablet 3   Vibegron (GEMTESA) 75 MG TABS Take 1 tablet (75 mg total) by mouth daily. 90 tablet 3   VITAMIN D PO Take by mouth daily.     No facility-administered medications prior to visit.     BP 138/88 (BP Location: Right Arm, Patient Position: Sitting, Cuff Size: Normal)   Pulse 82   Temp 97.8 F (36.6 C) (Temporal)   Ht 5\' 9"  (1.753 m)   Wt 202 lb (91.6 kg)   SpO2 100%   BMI 29.83 kg/m     Review of Systems: Gen:  Denies  fever, sweats, chills weight loss  HEENT: Denies blurred vision, double vision, ear pain, eye pain, hearing loss, nose bleeds, sore throat Cardiac:  No dizziness, chest pain or heaviness, chest tightness,edema, No JVD Resp: + cough, +sputum production, +shortness of breath,+wheezing, -hemoptysis,  Other:  All other systems negative   Physical Examination:   General Appearance: No distress  EYES PERRLA, EOM intact.   NECK Supple, No JVD Pulmonary: normal breath sounds, No wheezing.  CardiovascularNormal S1,S2.  No m/r/g.   Abdomen: Benign, Soft, non-tender. Neurology UE/LE 5/5 strength, no focal deficits Ext pulses intact, cap refill intact ALL OTHER ROS ARE NEGATIVE    ASSESSMENT AND PLAN  67 year old white male seen today for follow-up assessment for pulmonary function testing with the setting of reactive airways disease with previous smoking history now has a diagnosis of chronic bronchitis moderate obstructive sleep apnea based on pulmonary function testing with FEV1 59% predicted With shortness  of breath cough intermittent wheezing most likely related to COPD and also uncontrolled acid reflux from hiatal hernia in the setting of severe OSA.  At this time patient has COPD exacerbation, patient will need chest x-ray prednisone and antibiotics   COPD Gold stage B FEV1 59% predicted Continue inhalers as prescribed Currently on Advair HFA Albuterol as needed Avoid secondhand smoke Avoid SICK contacts Recommend  Masking  when appropriate Recommend Keep up-to-date with vaccinations  COPD exacerbation Start prednisone 40 mg daily for 7 days Start Z-Pak Obtain chest x-ray   Severe OSA with AHI of 60 Patient has trialed and failed CPAP therapy and oral device therapy multiple times  Patient has been referred to inspire device for further therapy  Risks of untreated sleep apnea explained to patient Referral to inspire electrical stimulation device however patient refused to undergo any type of surgical procedure to place stimulator in to his body Patient is at high risk for stroke cardiac arrest arrhythmia and  death  Overweight -recommend significant weight loss -recommend changing diet  Deconditioned state -Recommend increased daily activity and exercise   MEDICATION ADJUSTMENTS/LABS AND TESTS ORDERED: Continue albuterol as needed  Continue Advair HFA 115 Prednisone 40 mg daily for 7 days Avoid secondhand smoke Avoid SICK contacts Recommend  Masking  when appropriate Recommend Keep up-to-date with vaccinations Start Z-Pak Obtain CXR   CURRENT MEDICATIONS REVIEWED AT LENGTH WITH PATIENT TODAY   Patient satisfied with Plan of action and management. All questions answered  Follow up in 4 weeks  Total time spent 45 mins  Wallis Bamberg Santiago Glad, M.D.  [facility]  Medical Director [facility] Medical Director [facility] Cardio-Pulmonary Department

## 2022-12-19 NOTE — Patient Instructions (Addendum)
Start prednisone 40 mg daily for 7 days  Start Z-Pak for 5 days  Obtain chest x-ray to assess for pneumonia  HTN, stroke, uncontrolled diabetes and heart failure are potential risk factors.  Risk of untreated sleep apnea including cardiac arrhthymias, stroke, death

## 2022-12-28 ENCOUNTER — Other Ambulatory Visit: Payer: Self-pay | Admitting: Internal Medicine

## 2022-12-28 ENCOUNTER — Encounter: Payer: Self-pay | Admitting: Internal Medicine

## 2022-12-28 ENCOUNTER — Other Ambulatory Visit: Payer: Self-pay | Admitting: Family Medicine

## 2022-12-28 ENCOUNTER — Ambulatory Visit (INDEPENDENT_AMBULATORY_CARE_PROVIDER_SITE_OTHER): Payer: Medicare Other | Admitting: Internal Medicine

## 2022-12-28 VITALS — BP 116/70 | HR 94 | Temp 98.3°F | Ht 69.0 in | Wt 200.2 lb

## 2022-12-28 DIAGNOSIS — G4733 Obstructive sleep apnea (adult) (pediatric): Secondary | ICD-10-CM | POA: Diagnosis not present

## 2022-12-28 DIAGNOSIS — J449 Chronic obstructive pulmonary disease, unspecified: Secondary | ICD-10-CM

## 2022-12-28 DIAGNOSIS — J441 Chronic obstructive pulmonary disease with (acute) exacerbation: Secondary | ICD-10-CM

## 2022-12-28 MED ORDER — PREDNISONE 20 MG PO TABS: 20.0000 mg | ORAL_TABLET | Freq: Every day | ORAL | 2 refills | Status: DC

## 2022-12-28 MED ORDER — ALBUTEROL SULFATE HFA 108 (90 BASE) MCG/ACT IN AERS: 2.0000 | INHALATION_SPRAY | RESPIRATORY_TRACT | 2 refills | Status: AC | PRN

## 2022-12-28 MED ORDER — AMOXICILLIN-POT CLAVULANATE 875-125 MG PO TABS: 1.0000 | ORAL_TABLET | Freq: Two times a day (BID) | ORAL | 1 refills | Status: DC

## 2022-12-28 NOTE — Progress Notes (Signed)
Referring provider: Glori Luis, MD   SYNOPSIS 67 year old male seen for pulmonary consult September 2019 for sleep apnea and cough Medical history significant for A. Fib previous ablation now with newly diagnosed chronic bronchitis and COPD   TEST/EVENTS :  --Sleep study 01/05/16, AHI equals 60. Pulmonary function testing 11/2020 Moderate COPD FEV1 59% predicted Ratio is decreased Air trapping and hyperinflation  CC  Follow-up assessment for COPD and exacerbation Follow-up OSA severe sleep apnea AHI of 60 Follow-up PFT assessment FEV1 59% predicted obstructive airways disease    HPI Regarding COPD exacerbation Patient was prescribed prednisone and Z-Pak on 11/11  Remains in exacerbation at this time +productive cough green sputum No evidence of heart failure at this time No respiratory distress No fevers, chills, nausea, vomiting, diarrhea No evidence of lower extremity edema No evidence hemoptysis   Regarding severe sleep apnea AHI of 60 diagnosed in 2017 Refuses to wear CPAP Has trialed oral device Noncompliance due to claustrophobia High risk for cardiac failure arrhythmias stroke and death  Patient with chronic cough wheezing and mucus production Pulmonary function test revealed in detail with patient FEV1 shows 59% predicted with reduced ratio along with hyperinflation and air trapping      Allergies  Allergen Reactions   Other Other (See Comments)    Pollen:  Sinus infection, cough, fatigue    Immunization History  Administered Date(s) Administered   Fluad Quad(high Dose 65+) 10/09/2021   Influenza Whole 11/18/2010   Influenza,inj,Quad PF,6+ Mos 10/17/2012, 11/02/2013, 10/16/2014, 10/12/2016, 10/17/2017, 10/17/2018, 10/17/2019, 10/04/2020   Influenza-Unspecified 11/02/2011, 10/17/2012, 11/02/2013, 10/22/2015, 10/17/2019   PFIZER(Purple Top)SARS-COV-2 Vaccination 04/27/2019, 05/19/2019, 01/15/2020   Pneumococcal Polysaccharide-23  11/30/2020   Respiratory Syncytial Virus Vaccine,Recomb Aduvanted(Arexvy) 01/01/2022   Tdap 10/17/2012   Zoster Recombinant(Shingrix) 07/21/2020    Past Medical History:  Diagnosis Date   Anxiety    Arthritis    BPH (benign prostatic hyperplasia) 11/19/2013   Bulge of lumbar disc without myelopathy    L2/3 through L5/6   Bulging disc    C2/3, C3/4, C6/7   Cervical disc herniation    C4/5 and C5/6   Claustrophobia    Colon polyp 04/18/2011   Constipation 08/31/2012   COPD (chronic obstructive pulmonary disease) (HCC)    Depression    Diverticulosis    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 CHADS2VASc => 0; b. on Eliquis for pending ablation     Tobacco History: Social History   Tobacco Use  Smoking Status Former   Current packs/day: 0.00   Average packs/day: 1 pack/day for 20.0 years (20.0 ttl pk-yrs)   Types: Cigarettes   Start date: 02/08/1971   Quit date: 02/08/1991   Years since quitting: 31.9  Smokeless Tobacco Never   Counseling given: Not Answered    Outpatient Medications Prior to Visit  Medication Sig Dispense Refill   albuterol (VENTOLIN HFA) 108 (90 Base) MCG/ACT inhaler Inhale 1-2 puffs into the lungs every 6 (six) hours as  needed. 18 g 0   atorvastatin (LIPITOR) 40 MG tablet TAKE 1 TABLET BY MOUTH EVERY  MORNING 90 tablet 3   finasteride (PROSCAR) 5 MG tablet TAKE 1 TABLET BY MOUTH DAILY 90 tablet 3   fluticasone-salmeterol (ADVAIR HFA) 115-21 MCG/ACT inhaler Inhale 2 puffs into the lungs 2 (two) times daily. 1 each 12   losartan (COZAAR) 50 MG tablet Take 1  tablet (50 mg total) by mouth daily. 90 tablet 3   Multiple Vitamin (MULTIVITAMIN) tablet Take 1 tablet by mouth daily.     pantoprazole (PROTONIX) 40 MG tablet TAKE 1 TABLET BY MOUTH DAILY 90 tablet 3   predniSONE (DELTASONE) 20 MG tablet Take 2 tablets (40 mg total) by mouth daily with breakfast. 7 days 14 tablet 1   senna (SENOKOT) 8.6 MG tablet Take 2 tablets by mouth daily with supper.     tadalafil (CIALIS) 5 MG tablet TAKE ONE TABLET BY MOUTH DAILY 90 tablet 2   traZODone (DESYREL) 50 MG tablet Take 0.5-1 tablets (25-50 mg total) by mouth at bedtime as needed for sleep. 30 tablet 3   Vibegron (GEMTESA) 75 MG TABS Take 1 tablet (75 mg total) by mouth daily. 90 tablet 3   VITAMIN D PO Take by mouth daily.     No facility-administered medications prior to visit.    BP 116/70 (BP Location: Left Arm, Patient Position: Sitting, Cuff Size: Normal)   Pulse 94   Temp 98.3 F (36.8 C) (Oral)   Ht 5\' 9"  (1.753 m)   Wt 200 lb 3.2 oz (90.8 kg)   SpO2 96%   BMI 29.56 kg/m    Review of Systems: Gen:  Denies  fever, sweats, chills weight loss  HEENT: Denies blurred vision, double vision, ear pain, eye pain, hearing loss, nose bleeds, sore throat Cardiac:  No dizziness, chest pain or heaviness, chest tightness,edema, No JVD Resp:   + cough, +sputum production, +shortness of breath,-wheezing, -hemoptysis,  Other:  All other systems negative   Physical Examination:   General Appearance: No distress  EYES PERRLA, EOM intact.   NECK Supple, No JVD Pulmonary: normal breath sounds, No wheezing.  CardiovascularNormal S1,S2.  No m/r/g.   Abdomen: Benign, Soft, non-tender. Neurology UE/LE 5/5 strength, no focal deficits Ext pulses intact, cap refill intact ALL OTHER ROS ARE NEGATIVE   ASSESSMENT AND PLAN  67 year old white male seen today for follow-up assessment for pulmonary function testing in the setting of reactive airways disease with previous smoking history now with a diagnosis of  chronic bronchitis and moderate obstructive COPD FEV1 59% predicted with underlying severe sleep apnea Previous office visits for COPD exacerbation patient was given prednisone antibiotics chest x-ray  Chest x-ray 11/11 reviewed in detail with patient Independently reviewed today no acute findings no pneumonia  COPD Gold stage C FEV1 59% predicted Continue inhalers as prescribed Currently on Advair HFA Albuterol as needed   Avoid secondhand smoke Avoid SICK contacts Recommend  Masking  when appropriate Recommend Keep up-to-date with vaccinations   COPD exacerbation Start prednisone 40 mg daily for 7 days Start Z-Pak Obtain chest x-ray   Severe OSA with AHI of 60 Patient has tried and failed CPAP therapy and oral device therapy multiple times Patient has been referred to inspire device further therapy Risks of untreated sleep Referral to inspire electrical stimulation device however patient refused to undergo any type of surgical procedure to place stimulator in to his body Patient is at high risk for stroke cardiac arrest arrhythmia and  death       MEDICATION ADJUSTMENTS/LABS AND TESTS ORDERED: Continue albuterol as needed  Continue Advair HFA 115 Prednisone 40 mg daily for 7 days Avoid secondhand smoke Avoid SICK contacts Recommend  Masking  when appropriate Recommend Keep up-to-date with vaccinations Start Z-Pak Obtain CXR   CURRENT MEDICATIONS REVIEWED AT LENGTH WITH PATIENT TODAY   Patient satisfied with Plan of action and management. All questions answered  Follow up in 4 weeks  Total time spent 45 mins  Wallis Bamberg Santiago Glad, M.D.  [facility]  Medical Director [facility] Medical Director [facility] Cardio-Pulmonary Department

## 2022-12-28 NOTE — Patient Instructions (Signed)
Start Augmentin 875 mg twice daily for 10 days  Prednisone 20 mg daily for 7 days  Avoid secondhand smoke Avoid SICK contacts Recommend  Masking  when appropriate Recommend Keep up-to-date with vaccinations

## 2023-01-18 ENCOUNTER — Ambulatory Visit: Payer: BC Managed Care – PPO | Admitting: Internal Medicine

## 2023-01-19 ENCOUNTER — Ambulatory Visit: Payer: BC Managed Care – PPO | Admitting: Internal Medicine

## 2023-01-20 ENCOUNTER — Telehealth: Payer: Self-pay

## 2023-01-20 NOTE — Telephone Encounter (Signed)
Noted. We cannot make any changes until January.

## 2023-01-20 NOTE — Telephone Encounter (Signed)
Patient states his pharmacy is changing from [facility] to [facility], fax: [phone], phone:  [phone].  Patient states this change is effective 02/08/2023.

## 2023-01-26 ENCOUNTER — Other Ambulatory Visit: Payer: Self-pay | Admitting: Family Medicine

## 2023-01-26 DIAGNOSIS — J449 Chronic obstructive pulmonary disease, unspecified: Secondary | ICD-10-CM

## 2023-02-09 ENCOUNTER — Telehealth: Payer: Self-pay

## 2023-02-09 ENCOUNTER — Telehealth: Payer: Self-pay | Admitting: Family Medicine

## 2023-02-09 DIAGNOSIS — E782 Mixed hyperlipidemia: Secondary | ICD-10-CM

## 2023-02-09 DIAGNOSIS — G4700 Insomnia, unspecified: Secondary | ICD-10-CM

## 2023-02-09 MED ORDER — PANTOPRAZOLE SODIUM 40 MG PO TBEC: 40.0000 mg | DELAYED_RELEASE_TABLET | Freq: Every day | ORAL | 3 refills | Status: DC

## 2023-02-09 MED ORDER — LOSARTAN POTASSIUM 50 MG PO TABS: 50.0000 mg | ORAL_TABLET | Freq: Every day | ORAL | 3 refills | Status: DC

## 2023-02-09 MED ORDER — ATORVASTATIN CALCIUM 40 MG PO TABS: 40.0000 mg | ORAL_TABLET | Freq: Every morning | ORAL | 3 refills | Status: DC

## 2023-02-09 MED ORDER — TRAZODONE HCL 50 MG PO TABS: 25.0000 mg | ORAL_TABLET | Freq: Every evening | ORAL | 3 refills | Status: DC | PRN

## 2023-02-09 NOTE — Telephone Encounter (Signed)
 Patient is needing current medications and all future refills to be sent to [facility] mail service pharmacy, updated in preferred pharmacy list.

## 2023-02-09 NOTE — Telephone Encounter (Signed)
 Copied from CRM [id number]. Topic: Clinical - Prescription Issue >> Feb 09, 2023  9:33 AM Nestora J wrote: Reason for CRM: Pt wants to have his medications transferred to his updated preferred pharmacy asap, he states that he would like for them to have the order available so that they can be automatically refilled when it's time

## 2023-02-09 NOTE — Telephone Encounter (Signed)
 Medications that are written by Dr. Birdie Sons have been sent.

## 2023-02-20 DIAGNOSIS — H2512 Age-related nuclear cataract, left eye: Secondary | ICD-10-CM | POA: Diagnosis not present

## 2023-02-20 DIAGNOSIS — H43813 Vitreous degeneration, bilateral: Secondary | ICD-10-CM | POA: Diagnosis not present

## 2023-02-20 DIAGNOSIS — H2511 Age-related nuclear cataract, right eye: Secondary | ICD-10-CM | POA: Diagnosis not present

## 2023-02-20 DIAGNOSIS — H0259 Other disorders affecting eyelid function: Secondary | ICD-10-CM | POA: Diagnosis not present

## 2023-02-21 ENCOUNTER — Ambulatory Visit (INDEPENDENT_AMBULATORY_CARE_PROVIDER_SITE_OTHER): Payer: Medicare Other | Admitting: Nurse Practitioner

## 2023-02-21 VITALS — BP 124/80 | HR 78 | Temp 97.9°F | Ht 69.0 in | Wt 203.2 lb

## 2023-02-21 DIAGNOSIS — J449 Chronic obstructive pulmonary disease, unspecified: Secondary | ICD-10-CM

## 2023-02-21 DIAGNOSIS — G4700 Insomnia, unspecified: Secondary | ICD-10-CM | POA: Diagnosis not present

## 2023-02-21 DIAGNOSIS — R3916 Straining to void: Secondary | ICD-10-CM | POA: Diagnosis not present

## 2023-02-21 DIAGNOSIS — K219 Gastro-esophageal reflux disease without esophagitis: Secondary | ICD-10-CM

## 2023-02-21 DIAGNOSIS — N401 Enlarged prostate with lower urinary tract symptoms: Secondary | ICD-10-CM

## 2023-02-21 DIAGNOSIS — I10 Essential (primary) hypertension: Secondary | ICD-10-CM | POA: Diagnosis not present

## 2023-02-21 DIAGNOSIS — E785 Hyperlipidemia, unspecified: Secondary | ICD-10-CM

## 2023-02-21 DIAGNOSIS — G4733 Obstructive sleep apnea (adult) (pediatric): Secondary | ICD-10-CM

## 2023-02-21 DIAGNOSIS — Z1329 Encounter for screening for other suspected endocrine disorder: Secondary | ICD-10-CM

## 2023-02-21 DIAGNOSIS — R0982 Postnasal drip: Secondary | ICD-10-CM | POA: Insufficient documentation

## 2023-02-21 DIAGNOSIS — Z131 Encounter for screening for diabetes mellitus: Secondary | ICD-10-CM

## 2023-02-21 LAB — COMPREHENSIVE METABOLIC PANEL
ALT: 18 U/L (ref 0–53)
AST: 19 U/L (ref 0–37)
Albumin: 4.3 g/dL (ref 3.5–5.2)
Alkaline Phosphatase: 74 U/L (ref 39–117)
BUN: 17 mg/dL (ref 6–23)
CO2: 30 meq/L (ref 19–32)
Calcium: 9.9 mg/dL (ref 8.4–10.5)
Chloride: 105 meq/L (ref 96–112)
Creatinine, Ser: 0.94 mg/dL (ref 0.40–1.50)
GFR: 84.29 mL/min (ref >60.00)
Glucose, Bld: 71 mg/dL (ref 70–99)
Potassium: 4.5 meq/L (ref 3.5–5.1)
Sodium: 144 meq/L (ref 135–145)
Total Bilirubin: 1.5 mg/dL — ABNORMAL HIGH (ref 0.2–1.2)
Total Protein: 6.5 g/dL (ref 6.0–8.3)

## 2023-02-21 LAB — CBC WITH DIFFERENTIAL/PLATELET
Basophils Absolute: 0.1 10*3/uL (ref 0.0–0.1)
Basophils Relative: 1.5 % (ref 0.0–3.0)
Eosinophils Absolute: 0.1 10*3/uL (ref 0.0–0.7)
Eosinophils Relative: 1.8 % (ref 0.0–5.0)
HCT: 45.5 % (ref 39.0–52.0)
Hemoglobin: 15.2 g/dL (ref 13.0–17.0)
Lymphocytes Relative: 33.5 % (ref 12.0–46.0)
Lymphs Abs: 1.9 10*3/uL (ref 0.7–4.0)
MCHC: 33.5 g/dL (ref 30.0–36.0)
MCV: 90.9 fL (ref 78.0–100.0)
Monocytes Absolute: 0.4 10*3/uL (ref 0.1–1.0)
Monocytes Relative: 7.9 % (ref 3.0–12.0)
Neutro Abs: 3.1 10*3/uL (ref 1.4–7.7)
Neutrophils Relative %: 55.3 % (ref 43.0–77.0)
Platelets: 260 10*3/uL (ref 150.0–400.0)
RBC: 5 Mil/uL (ref 4.22–5.81)
RDW: 14 % (ref 11.5–15.5)
WBC: 5.6 10*3/uL (ref 4.0–10.5)

## 2023-02-21 LAB — LIPID PANEL
Cholesterol: 144 mg/dL (ref 0–200)
HDL: 47.7 mg/dL (ref >39.00)
LDL Cholesterol: 68 mg/dL (ref 0–99)
NonHDL: 96.16
Total CHOL/HDL Ratio: 3
Triglycerides: 139 mg/dL (ref 0.0–149.0)
VLDL: 27.8 mg/dL (ref 0.0–40.0)

## 2023-02-21 LAB — TSH: TSH: 0.52 u[IU]/mL (ref 0.35–5.50)

## 2023-02-21 LAB — HEMOGLOBIN A1C: Hgb A1c MFr Bld: 6 % (ref 4.6–6.5)

## 2023-02-21 NOTE — Progress Notes (Signed)
 Tyler Glance, NP-C Phone: [phone]  [name]. is a 68 y.o. male who presents today for transfer of care.   Discussed the use of AI scribe software for clinical note transcription with the patient, who gave verbal consent to proceed.  History of Present Illness   The patient, with a history of COPD, sleep apnea, and BPH, presents with a persistent cough for the past two months. The cough is described as a tickle in the throat, not originating from the lungs, and is not associated with wheezing. The patient also reports a runny nose and a sensation of postnasal drip. The cough is not disruptive to sleep and does not cause the patient to wake up at night. The patient has tried cough medicine, which seemed to alleviate the symptoms.  The patient also reports a sensation of soreness in the chest area, which he attributes to the persistent cough. He denies any chest pain, dizziness, or swelling. The patient's COPD is managed with Advair , although he expresses skepticism about its necessity and concerns about potential side effects. He has an albuterol  inhaler for use as needed but does not use it regularly.  The patient's sleep apnea is managed with sleeping in an inclined position, as the use of a CPAP machine caused feelings of claustrophobia. He has declined the option of an [place] device. The patient also reports some issues with sleep, which have been somewhat alleviated with the use of half a tablet of trazodone .  The patient's BPH is managed with finasteride  and Cialis . He has stopped taking Gemtesa  for overactive bladder, as he did not find it helpful. The patient is also on pantoprazole  for acid reflux, although he does not report any symptoms of this condition.  The patient's hypertension is well-controlled with losartan , and he also takes atorvastatin  for cholesterol management. He reports no issues with these medications. The patient has a simple diet and does not consume a lot of  caffeine  or alcohol. He reports no blood in stool or trouble swallowing.      Social History   Tobacco Use  Smoking Status Former   Current packs/day: 0.00   Average packs/day: 1 pack/day for 20.0 years (20.0 ttl pk-yrs)   Types: Cigarettes   Start date: 02/08/1971   Quit date: 02/08/1991   Years since quitting: 32.0  Smokeless Tobacco Never    Current Outpatient Medications on File Prior to Visit  Medication Sig Dispense Refill   ADVAIR  HFA 115-21 MCG/ACT inhaler USE 2 INHALATIONS BY MOUTH TWICE DAILY 36 g 3   albuterol  (VENTOLIN  HFA) 108 (90 Base) MCG/ACT inhaler Inhale 1-2 puffs into the lungs every 6 (six) hours as needed. 18 g 0   albuterol  (VENTOLIN  HFA) 108 (90 Base) MCG/ACT inhaler Inhale 2 puffs into the lungs every 4 (four) hours as needed for wheezing or shortness of breath. 8 g 2   atorvastatin  (LIPITOR) 40 MG tablet Take 1 tablet (40 mg total) by mouth every morning. 90 tablet 3   finasteride  (PROSCAR ) 5 MG tablet TAKE 1 TABLET BY MOUTH DAILY 90 tablet 3   losartan  (COZAAR ) 50 MG tablet Take 1 tablet (50 mg total) by mouth daily. 90 tablet 3   Multiple Vitamin (MULTIVITAMIN) tablet Take 1 tablet by mouth daily.     pantoprazole  (PROTONIX ) 40 MG tablet Take 1 tablet (40 mg total) by mouth daily. 90 tablet 3   senna (SENOKOT) 8.6 MG tablet Take 2 tablets by mouth daily with supper.     tadalafil  (  CIALIS ) 5 MG tablet TAKE ONE TABLET BY MOUTH DAILY 90 tablet 2   traZODone  (DESYREL ) 50 MG tablet Take 0.5-1 tablets (25-50 mg total) by mouth at bedtime as needed for sleep. 30 tablet 3   VITAMIN D PO Take by mouth daily.     Vibegron  (GEMTESA ) 75 MG TABS Take 1 tablet (75 mg total) by mouth daily. 90 tablet 3   No current facility-administered medications on file prior to visit.    ROS see history of present illness  Objective  Physical Exam Vitals:   02/21/23 0855  BP: 124/80  Pulse: 78  Temp: 97.9 F (36.6 C)  SpO2: 97%    BP Readings from Last 3 Encounters:   02/21/23 124/80  12/28/22 116/70  12/19/22 138/88   Wt Readings from Last 3 Encounters:  02/21/23 203 lb 3.2 oz (92.2 kg)  12/28/22 200 lb 3.2 oz (90.8 kg)  12/19/22 202 lb (91.6 kg)    Physical Exam Constitutional:      General: He is not in acute distress.    Appearance: Normal appearance.  HENT:     Head: Normocephalic.     Right Ear: Tympanic membrane normal.     Left Ear: Tympanic membrane normal.     Nose: Nose normal.     Mouth/Throat:     Mouth: Mucous membranes are moist.     Pharynx: Oropharynx is clear.  Eyes:     Conjunctiva/sclera: Conjunctivae normal.     Pupils: Pupils are equal, round, and reactive to light.  Cardiovascular:     Rate and Rhythm: Normal rate and regular rhythm.     Heart sounds: Normal heart sounds.  Pulmonary:     Effort: Pulmonary effort is normal.     Breath sounds: Normal breath sounds.  Lymphadenopathy:     Cervical: No cervical adenopathy.  Skin:    General: Skin is warm and dry.  Neurological:     General: No focal deficit present.     Mental Status: He is alert.  Psychiatric:        Mood and Affect: Mood normal.        Behavior: Behavior normal.    Assessment/Plan: Please see individual problem list.  PND (post-nasal drip) Assessment & Plan: The persistent cough for two months may be related to postnasal drip, with no fever, sore throat, or worsening congestion, and no nocturnal symptoms. Start Zyrtec daily. Consider adding Mucinex if there is no improvement. Return if symptoms worsen or new symptoms develop, such as sore throat, increased congestion, or fever.   Primary hypertension Assessment & Plan: Hypertension is well-controlled on Losartan  50mg  daily. Continue Losartan  50mg  daily.  Orders: -     CBC with Differential/Platelet -     Comprehensive metabolic panel  Hyperlipidemia, unspecified hyperlipidemia type Assessment & Plan: Hyperlipidemia is managed with Atorvastatin  40mg  daily. Order a lipid panel to  assess control. Continue Atorvastatin .   Orders: -     Lipid panel  Chronic obstructive pulmonary disease, unspecified COPD type (HCC) Assessment & Plan: Stable with no new shortness of breath or wheezing. Chronic cough. Continue Advair  as prescribed, despite hesitance about consistent use. Use Albuterol  as needed.   Obstructive sleep apnea syndrome Assessment & Plan: Symptoms are managed with positional changes, as the Inspire device was declined. He was unable to tolerate CPAP mask or dental device. Continue the current management strategy. Counseled on risks of untreated sleep apnea.    Insomnia, unspecified type Assessment & Plan: Insomnia is managed with  Trazodone  50mg  (half tablet) at bedtime. Continue Trazodone  as prescribed.   Benign prostatic hyperplasia (BPH) with straining on urination Assessment & Plan: BPH is managed with Finasteride  and Cialis , with Gemtesa  discontinued. Continue Finasteride  and Cialis . Follow up with Urology as scheduled.    Gastroesophageal reflux disease, unspecified whether esophagitis present Assessment & Plan: There are no current symptoms of reflux while on Pantoprazole . Continue Pantoprazole  40mg  daily.   Thyroid  disorder screen -     TSH  Diabetes mellitus screening -     Hemoglobin A1c    Return in about 6 months (around 08/21/2023), or if symptoms worsen or fail to improve, for Follow up.   Tyler Glance, NP-C [facility]

## 2023-02-22 ENCOUNTER — Encounter: Payer: Self-pay | Admitting: Nurse Practitioner

## 2023-02-22 NOTE — Assessment & Plan Note (Signed)
 Symptoms are managed with positional changes, as the Inspire device was declined. He was unable to tolerate CPAP mask or dental device. Continue the current management strategy. Counseled on risks of untreated sleep apnea.

## 2023-02-22 NOTE — Assessment & Plan Note (Signed)
 BPH is managed with Finasteride  and Cialis , with Gemtesa  discontinued. Continue Finasteride  and Cialis . Follow up with Urology as scheduled.

## 2023-02-22 NOTE — Assessment & Plan Note (Signed)
 The persistent cough for two months may be related to postnasal drip, with no fever, sore throat, or worsening congestion, and no nocturnal symptoms. Start Zyrtec daily. Consider adding Mucinex if there is no improvement. Return if symptoms worsen or new symptoms develop, such as sore throat, increased congestion, or fever.

## 2023-02-22 NOTE — Assessment & Plan Note (Signed)
 There are no current symptoms of reflux while on Pantoprazole . Continue Pantoprazole  40mg  daily.

## 2023-02-22 NOTE — Assessment & Plan Note (Signed)
 Hyperlipidemia is managed with Atorvastatin  40mg  daily. Order a lipid panel to assess control. Continue Atorvastatin .

## 2023-02-22 NOTE — Assessment & Plan Note (Addendum)
 Stable with no new shortness of breath or wheezing. Chronic cough. Continue Advair  as prescribed, despite hesitance about consistent use. Use Albuterol  as needed.

## 2023-02-22 NOTE — Assessment & Plan Note (Signed)
 Hypertension is well-controlled on Losartan  50mg  daily. Continue Losartan  50mg  daily.

## 2023-02-22 NOTE — Assessment & Plan Note (Signed)
 Insomnia is managed with Trazodone  50mg  (half tablet) at bedtime. Continue Trazodone  as prescribed.

## 2023-03-31 ENCOUNTER — Other Ambulatory Visit: Payer: Self-pay | Admitting: Urology

## 2023-03-31 DIAGNOSIS — N138 Other obstructive and reflux uropathy: Secondary | ICD-10-CM

## 2023-03-31 DIAGNOSIS — N529 Male erectile dysfunction, unspecified: Secondary | ICD-10-CM

## 2023-04-11 ENCOUNTER — Other Ambulatory Visit: Payer: BC Managed Care – PPO

## 2023-04-14 ENCOUNTER — Ambulatory Visit: Payer: BC Managed Care – PPO | Admitting: Urology

## 2023-05-10 ENCOUNTER — Telehealth: Payer: Self-pay

## 2023-05-10 ENCOUNTER — Other Ambulatory Visit: Payer: Self-pay

## 2023-05-10 DIAGNOSIS — R972 Elevated prostate specific antigen [PSA]: Secondary | ICD-10-CM

## 2023-05-10 MED ORDER — FINASTERIDE 5 MG PO TABS
5.0000 mg | ORAL_TABLET | Freq: Every day | ORAL | 0 refills | Status: DC
Start: 2023-05-10 — End: 2023-07-31

## 2023-05-10 MED ORDER — LOSARTAN POTASSIUM 50 MG PO TABS
50.0000 mg | ORAL_TABLET | Freq: Every day | ORAL | 0 refills | Status: AC
Start: 2023-05-10 — End: ?

## 2023-05-10 NOTE — Telephone Encounter (Signed)
 In error I refilled pts losartan 50 mg. I did refill finasteride # 90.   Contacted pharmacy at [facility] and advised that we do NOT fill losartan. She made a note on pts account and will NOT refill.   Will send to [facility] to deny when she sees the refill in her box.

## 2023-06-08 ENCOUNTER — Other Ambulatory Visit: Payer: Self-pay | Admitting: Nurse Practitioner

## 2023-06-08 DIAGNOSIS — G4700 Insomnia, unspecified: Secondary | ICD-10-CM

## 2023-06-08 NOTE — Telephone Encounter (Signed)
 Copied from CRM [id number]. Topic: Clinical - Medication Refill >> Jun 08, 2023 12:36 PM Jenice Mitts wrote: Most Recent Primary Care Visit:  Provider: Bluford Burkitt  Department: [facility]  Visit Type: TRANSFER OF CARE  Date: 02/21/2023  Medication: traZODone  (DESYREL )  Has the patient contacted their pharmacy? Yes (Agent: If no, request that the patient contact the pharmacy for the refill. If patient does not wish to contact the pharmacy document the reason why and proceed with request.) (Agent: If yes, when and what did the pharmacy advise?)  Is this the correct pharmacy for this prescription? Yes If no, delete pharmacy and type the correct one.  This is the patient's preferred pharmacy:  [facility] - [place], [place] - [address] [place] [address] Phone: [phone] Fax: [phone]    Has the prescription been filled recently? No  Is the patient out of the medication? Yes  Has the patient been seen for an appointment in the last year OR does the patient have an upcoming appointment? Yes  Can we respond through MyChart? Yes  Agent: Please be advised that Rx refills may take up to 3 business days. We ask that you follow-up with your pharmacy.

## 2023-06-09 MED ORDER — TRAZODONE HCL 50 MG PO TABS: 25.0000 mg | ORAL_TABLET | Freq: Every evening | ORAL | 3 refills | Status: DC | PRN

## 2023-07-11 ENCOUNTER — Ambulatory Visit (INDEPENDENT_AMBULATORY_CARE_PROVIDER_SITE_OTHER): Admitting: Family

## 2023-07-11 ENCOUNTER — Encounter: Payer: Self-pay | Admitting: Family

## 2023-07-11 ENCOUNTER — Other Ambulatory Visit (INDEPENDENT_AMBULATORY_CARE_PROVIDER_SITE_OTHER)

## 2023-07-11 ENCOUNTER — Telehealth: Payer: Self-pay | Admitting: Family

## 2023-07-11 ENCOUNTER — Ambulatory Visit
Admission: RE | Admit: 2023-07-11 | Discharge: 2023-07-11 | Disposition: A | Discharge status: Home / Self Care | Source: Ambulatory Visit | Attending: Family | Admitting: Family

## 2023-07-11 VITALS — BP 134/80 | HR 100 | Temp 98.0°F | Ht 69.0 in | Wt 204.0 lb

## 2023-07-11 DIAGNOSIS — N401 Enlarged prostate with lower urinary tract symptoms: Secondary | ICD-10-CM

## 2023-07-11 DIAGNOSIS — R3916 Straining to void: Secondary | ICD-10-CM | POA: Diagnosis not present

## 2023-07-11 DIAGNOSIS — R1032 Left lower quadrant pain: Secondary | ICD-10-CM | POA: Insufficient documentation

## 2023-07-11 DIAGNOSIS — K5792 Diverticulitis of intestine, part unspecified, without perforation or abscess without bleeding: Secondary | ICD-10-CM

## 2023-07-11 DIAGNOSIS — R7303 Prediabetes: Secondary | ICD-10-CM

## 2023-07-11 DIAGNOSIS — R350 Frequency of micturition: Secondary | ICD-10-CM

## 2023-07-11 DIAGNOSIS — Z87898 Personal history of other specified conditions: Secondary | ICD-10-CM | POA: Diagnosis not present

## 2023-07-11 DIAGNOSIS — K4090 Unilateral inguinal hernia, without obstruction or gangrene, not specified as recurrent: Secondary | ICD-10-CM | POA: Diagnosis not present

## 2023-07-11 DIAGNOSIS — K5732 Diverticulitis of large intestine without perforation or abscess without bleeding: Secondary | ICD-10-CM | POA: Diagnosis not present

## 2023-07-11 DIAGNOSIS — N281 Cyst of kidney, acquired: Secondary | ICD-10-CM | POA: Diagnosis not present

## 2023-07-11 DIAGNOSIS — K8689 Other specified diseases of pancreas: Secondary | ICD-10-CM | POA: Diagnosis not present

## 2023-07-11 LAB — POC URINALSYSI DIPSTICK (AUTOMATED)
Bilirubin, UA: NEGATIVE
Blood, UA: NEGATIVE
Glucose, UA: NEGATIVE
Ketones, UA: NEGATIVE
Leukocytes, UA: NEGATIVE
Nitrite, UA: NEGATIVE
Protein, UA: NEGATIVE
Spec Grav, UA: 1.02 (ref 1.010–1.025)
Urobilinogen, UA: 1 U/dL
pH, UA: 5.5 (ref 5.0–8.0)

## 2023-07-11 LAB — URINALYSIS, ROUTINE W REFLEX MICROSCOPIC
Bilirubin Urine: NEGATIVE
Hgb urine dipstick: NEGATIVE
Ketones, ur: NEGATIVE
Leukocytes,Ua: NEGATIVE
Nitrite: NEGATIVE
RBC / HPF: NONE SEEN (ref 0–?)
Specific Gravity, Urine: 1.02 (ref 1.000–1.030)
Total Protein, Urine: NEGATIVE
Urine Glucose: NEGATIVE
Urobilinogen, UA: 0.2 (ref 0.0–1.0)
WBC, UA: NONE SEEN (ref 0–?)
pH: 6 (ref 5.0–8.0)

## 2023-07-11 LAB — PSA: PSA: 1.07 ng/mL (ref <=4.00)

## 2023-07-11 MED ORDER — IOHEXOL 300 MG/ML  SOLN
100.0000 mL | Freq: Once | INTRAMUSCULAR | Status: DC | PRN
Start: 2023-07-11 — End: 2023-07-11

## 2023-07-11 MED ORDER — IOHEXOL 350 MG/ML SOLN
80.0000 mL | Freq: Once | INTRAVENOUS | Status: AC | PRN
  Administered 2023-07-11: 80 mL via INTRAVENOUS

## 2023-07-11 MED ORDER — AMOXICILLIN-POT CLAVULANATE 875-125 MG PO TABS: 1.0000 | ORAL_TABLET | Freq: Three times a day (TID) | ORAL | 0 refills | Status: AC

## 2023-07-11 NOTE — Assessment & Plan Note (Addendum)
 Nontoxic in appearance. POC UA negative RBCs, WBCs. Focal LLQ pain on exam. Differential includes diverticulitis, prostatitis, UTI, constipation. Pending labs, stat Ab C/p. Consider fluoroquinolone or bactrim; will await result of CT.

## 2023-07-11 NOTE — Progress Notes (Signed)
 Assessment & Plan:  Left lower quadrant abdominal pain -     CT ABDOMEN PELVIS W CONTRAST; Future -     Comprehensive metabolic panel with GFR -     CBC with Differential/Platelet -     PSA -     Urinalysis, Routine w reflex microscopic -     Urine Culture  Benign prostatic hyperplasia (BPH) with straining on urination -     PSA  LLQ pain Assessment & Plan: Nontoxic in appearance. POC UA negative RBCs, WBCs. Focal LLQ pain on exam. Differential includes diverticulitis, prostatitis, UTI, constipation. Pending labs, stat Ab C/p. Consider fluoroquinolone or bactrim; will await result of CT.    History of prediabetes -     Hemoglobin A1c  Prediabetes -     Hemoglobin A1c     Return precautions given.   Risks, benefits, and alternatives of the medications and treatment plan prescribed today were discussed, and patient expressed understanding.   Education regarding symptom management and diagnosis given to patient on AVS either electronically or printed.  No follow-ups on file.  Bascom Bossier, FNP  Subjective:    Patient ID: Tyler Binder., male    DOB: 12-17-1955, 68 y.o.   MRN: [id number]  CC: Tyler Statzer. is a 68 y.o. male who presents today for an acute visit.    HPI: Complains of urinary frequency x one week He has chronic constipation.He takes sennakot 2 tablets daily.  Small BM today, brown. No blood in stool.  He had a milkshake yesterday and LLQ and then pain started.   Denies fever, chills, n, vomiting, diarrhea, pain with defecation.    No h/o renal stone.    H/o preDM, diverticulitis, HTN, OSA, COPD, lumbosacral radiculopathy, BPH, OAB, GERD H/o hernia repair Last seen urology 09/2022 for BPH, nocturia, ED, epididymal cyst, renal cyst  No h/o CKD  Colonoscopy 06/2022, internal hemorrhoids, diverticula, polyps.   Allergies: Other Current Outpatient Medications on File Prior to Visit  Medication Sig Dispense Refill   ADVAIR  HFA 115-21 MCG/ACT  inhaler USE 2 INHALATIONS BY MOUTH TWICE DAILY 36 g 3   albuterol  (VENTOLIN  HFA) 108 (90 Base) MCG/ACT inhaler Inhale 2 puffs into the lungs every 4 (four) hours as needed for wheezing or shortness of breath. 8 g 2   atorvastatin  (LIPITOR) 40 MG tablet Take 1 tablet (40 mg total) by mouth every morning. 90 tablet 3   finasteride  (PROSCAR ) 5 MG tablet Take 1 tablet (5 mg total) by mouth daily. 90 tablet 0   losartan  (COZAAR ) 50 MG tablet Take 1 tablet (50 mg total) by mouth daily. 90 tablet 0   Multiple Vitamin (MULTIVITAMIN) tablet Take 1 tablet by mouth daily.     pantoprazole  (PROTONIX ) 40 MG tablet Take 1 tablet (40 mg total) by mouth daily. 90 tablet 3   senna (SENOKOT) 8.6 MG tablet Take 2 tablets by mouth daily with supper.     tadalafil  (CIALIS ) 5 MG tablet TAKE 1 TABLET BY MOUTH DAILY 90 tablet 3   traZODone  (DESYREL ) 50 MG tablet Take 0.5-1 tablets (25-50 mg total) by mouth at bedtime as needed for sleep. 30 tablet 3   VITAMIN D PO Take by mouth daily.     No current facility-administered medications on file prior to visit.    Review of Systems  Constitutional:  Negative for chills and fever.  HENT:  Negative for sinus pressure.   Respiratory:  Negative for cough.   Cardiovascular:  Negative  for chest pain and palpitations.  Gastrointestinal:  Positive for abdominal pain and constipation. Negative for diarrhea, nausea and vomiting.  Genitourinary:  Positive for frequency. Negative for difficulty urinating.      Objective:    BP 134/80   Pulse 100   Temp 98 F (36.7 C)   Ht 5\' 9"  (1.753 m)   Wt 204 lb (92.5 kg)   SpO2 98%   BMI 30.13 kg/m   BP Readings from Last 3 Encounters:  07/11/23 134/80  02/21/23 124/80  12/28/22 116/70   Wt Readings from Last 3 Encounters:  07/11/23 204 lb (92.5 kg)  02/21/23 203 lb 3.2 oz (92.2 kg)  12/28/22 200 lb 3.2 oz (90.8 kg)    Physical Exam Vitals reviewed.  Constitutional:      Appearance: Normal appearance. He is  well-developed.  Cardiovascular:     Rate and Rhythm: Regular rhythm.     Heart sounds: Normal heart sounds.  Pulmonary:     Effort: Pulmonary effort is normal. No respiratory distress.     Breath sounds: Normal breath sounds. No wheezing or rales.  Abdominal:     General: Bowel sounds are normal. There is no distension.     Palpations: Abdomen is soft. Abdomen is not rigid. There is no fluid wave or mass.     Tenderness: There is abdominal tenderness in the left lower quadrant. There is no right CVA tenderness, left CVA tenderness, guarding or rebound. Negative signs include Murphy's sign and McBurney's sign.    Skin:    General: Skin is warm and dry.  Neurological:     Mental Status: He is alert.  Psychiatric:        Speech: Speech normal.        Behavior: Behavior normal.

## 2023-07-11 NOTE — Telephone Encounter (Signed)
 [facility] team  Call pt   How is he feeling since starting augmentin ?  Sch 1 week follow up with PCP or myself for diverticulitis and to discuss CT a/p regarding hepatic steatosis , aortic atherosclerosis, prominent prostate.   Phone note below:   Spoke with pt about diverticulitis, uncomplicated seen on CT ab/p Continues to LLQ pain No fever, chills He is most comfortable with starting abx in setting of pain.   Plan:   Discussed liquid diet and to advance as tolerated. He will take sennakot for constipation Discussed briefly incidental findings on CT and advised him I would send a mychart result note with more detail.   Sent in Augmentin  q8hr dosing per update to date for 7 days.  Advised probiotics Referral to gi for colonoscopy in 6-8 weeks  1 week follow up in office  He will let us  know how he is doing

## 2023-07-11 NOTE — Patient Instructions (Signed)
 Proceed with Ct today  If symptoms worsen during this work up period, please go to nearest emergency room  Please keep cell phone with you today so I may call you if critical result.

## 2023-07-12 ENCOUNTER — Other Ambulatory Visit: Payer: Self-pay | Admitting: Family

## 2023-07-12 ENCOUNTER — Ambulatory Visit: Payer: Self-pay | Admitting: Family

## 2023-07-12 DIAGNOSIS — R899 Unspecified abnormal finding in specimens from other organs, systems and tissues: Secondary | ICD-10-CM

## 2023-07-12 LAB — COMPREHENSIVE METABOLIC PANEL WITH GFR
ALT: 15 U/L (ref 0–53)
AST: 17 U/L (ref 0–37)
Albumin: 4.4 g/dL (ref 3.5–5.2)
Alkaline Phosphatase: 67 U/L (ref 39–117)
BUN: 19 mg/dL (ref 6–23)
CO2: 30 meq/L (ref 19–32)
Calcium: 9.8 mg/dL (ref 8.4–10.5)
Chloride: 103 meq/L (ref 96–112)
Creatinine, Ser: 0.89 mg/dL (ref 0.40–1.50)
GFR: 88.87 mL/min (ref >60.00)
Glucose, Bld: 78 mg/dL (ref 70–99)
Potassium: 4.2 meq/L (ref 3.5–5.1)
Sodium: 140 meq/L (ref 135–145)
Total Bilirubin: 1.9 mg/dL — ABNORMAL HIGH (ref 0.2–1.2)
Total Protein: 6.7 g/dL (ref 6.0–8.3)

## 2023-07-12 LAB — CBC WITH DIFFERENTIAL/PLATELET
Basophils Absolute: 0 10*3/uL (ref 0.0–0.1)
Basophils Relative: 0.4 % (ref 0.0–3.0)
Eosinophils Absolute: 0.1 10*3/uL (ref 0.0–0.7)
Eosinophils Relative: 0.9 % (ref 0.0–5.0)
HCT: 44.1 % (ref 39.0–52.0)
Hemoglobin: 15.1 g/dL (ref 13.0–17.0)
Lymphocytes Relative: 21.4 % (ref 12.0–46.0)
Lymphs Abs: 2 10*3/uL (ref 0.7–4.0)
MCHC: 34.3 g/dL (ref 30.0–36.0)
MCV: 88.6 fl (ref 78.0–100.0)
Monocytes Absolute: 1 10*3/uL (ref 0.1–1.0)
Monocytes Relative: 10.9 % (ref 3.0–12.0)
Neutro Abs: 6.3 10*3/uL (ref 1.4–7.7)
Neutrophils Relative %: 66.4 % (ref 43.0–77.0)
Platelets: 203 10*3/uL (ref 150.0–400.0)
RBC: 4.98 Mil/uL (ref 4.22–5.81)
RDW: 14.1 % (ref 11.5–15.5)
WBC: 9.4 10*3/uL (ref 4.0–10.5)

## 2023-07-12 LAB — HEMOGLOBIN A1C: Hgb A1c MFr Bld: 5.8 % (ref 4.6–6.5)

## 2023-07-12 LAB — URINE CULTURE
MICRO NUMBER:: 16532589
Result:: NO GROWTH
SPECIMEN QUALITY:: ADEQUATE

## 2023-07-12 NOTE — Telephone Encounter (Signed)
 FYI  Follow up next week with you for diverticulitis , constipation to ensure clinical improvement I started augmentin  due to discomfort and patient preference Referral for colonoscopy in 6-8 weeks is already placed

## 2023-07-12 NOTE — Telephone Encounter (Signed)
 Spoke to pt about message below he stated that he is still in a little pain , but not too bad, scheduled a f/up with Kacy on 07/19/23 @ 8 am

## 2023-07-13 ENCOUNTER — Other Ambulatory Visit (INDEPENDENT_AMBULATORY_CARE_PROVIDER_SITE_OTHER)

## 2023-07-13 DIAGNOSIS — R899 Unspecified abnormal finding in specimens from other organs, systems and tissues: Secondary | ICD-10-CM | POA: Diagnosis not present

## 2023-07-14 LAB — RETICULOCYTES
ABS Retic: 51800 {cells}/uL (ref 25000–90000)
Retic Ct Pct: 1 %

## 2023-07-14 LAB — BILIRUBIN, FRACTIONATED(TOT/DIR/INDIR)
Bilirubin, Direct: 0.4 mg/dL — ABNORMAL HIGH (ref 0.0–0.2)
Indirect Bilirubin: 1.5 mg/dL — ABNORMAL HIGH (ref 0.2–1.2)
Total Bilirubin: 1.9 mg/dL — ABNORMAL HIGH (ref 0.2–1.2)

## 2023-07-15 ENCOUNTER — Ambulatory Visit: Payer: Self-pay | Admitting: Family

## 2023-07-19 ENCOUNTER — Ambulatory Visit (INDEPENDENT_AMBULATORY_CARE_PROVIDER_SITE_OTHER): Admitting: Nurse Practitioner

## 2023-07-19 ENCOUNTER — Telehealth: Payer: Self-pay

## 2023-07-19 VITALS — BP 110/70 | HR 68 | Temp 98.2°F | Ht 69.0 in | Wt 201.4 lb

## 2023-07-19 DIAGNOSIS — K4090 Unilateral inguinal hernia, without obstruction or gangrene, not specified as recurrent: Secondary | ICD-10-CM | POA: Diagnosis not present

## 2023-07-19 DIAGNOSIS — R1032 Left lower quadrant pain: Secondary | ICD-10-CM

## 2023-07-19 DIAGNOSIS — I700 Atherosclerosis of aorta: Secondary | ICD-10-CM | POA: Diagnosis not present

## 2023-07-19 DIAGNOSIS — K760 Fatty (change of) liver, not elsewhere classified: Secondary | ICD-10-CM | POA: Diagnosis not present

## 2023-07-19 DIAGNOSIS — K5909 Other constipation: Secondary | ICD-10-CM | POA: Diagnosis not present

## 2023-07-19 NOTE — Progress Notes (Signed)
 Bluford Burkitt, NP-C Phone: [phone]  Tyler Pascal Wah Sabic. is a 68 y.o. male who presents today for follow up.   Discussed the use of AI scribe software for clinical note transcription with the patient, who gave verbal consent to proceed.  History of Present Illness   Tyler Housey. is a 68 year old male with diverticulitis who presents for follow-up on CT scan results and management of constipation.  He has ongoing constipation, describing it as 'not really going to the bathroom, number two' and experiencing discomfort due to being 'all backed up.' He experiences minimal bowel movements and feels constipated, initially presenting with pain on the left side. He uses two Senokot tablets every night, and occasionally in the morning, but reports limited effectiveness. He recalls a medication prescribed previously that was effective in relieving constipation, but he has not followed up on obtaining it.  A recent CT scan identified diverticulitis, fatty liver, and a right lateral abdominal wall lipoma. He has a history of inguinal hernia repair on the right side approximately 40 years ago, with mesh placement. He was unaware of a left inguinal hernia but notes pain in that area.  He has a history of aortic atherosclerosis noted in a scan from 2023, with concerns about plaque buildup. He is currently on cholesterol medication and reports good control of his LDL levels. He is mindful of his diet, avoiding salt, and is aware of the importance of managing risk factors for heart disease.  Social History   Tobacco Use  Smoking Status Former   Current packs/day: 0.00   Average packs/day: 1 pack/day for 20.0 years (20.0 ttl pk-yrs)   Types: Cigarettes   Start date: 02/08/1971   Quit date: 02/08/1991   Years since quitting: 32.4  Smokeless Tobacco Never    Current Outpatient Medications on File Prior to Visit  Medication Sig Dispense Refill   ADVAIR  HFA 115-21 MCG/ACT inhaler USE 2 INHALATIONS BY MOUTH  TWICE DAILY 36 g 3   albuterol  (VENTOLIN  HFA) 108 (90 Base) MCG/ACT inhaler Inhale 2 puffs into the lungs every 4 (four) hours as needed for wheezing or shortness of breath. 8 g 2   atorvastatin  (LIPITOR) 40 MG tablet Take 1 tablet (40 mg total) by mouth every morning. 90 tablet 3   finasteride  (PROSCAR ) 5 MG tablet Take 1 tablet (5 mg total) by mouth daily. 90 tablet 0   losartan  (COZAAR ) 50 MG tablet Take 1 tablet (50 mg total) by mouth daily. 90 tablet 0   Multiple Vitamin (MULTIVITAMIN) tablet Take 1 tablet by mouth daily.     pantoprazole  (PROTONIX ) 40 MG tablet Take 1 tablet (40 mg total) by mouth daily. 90 tablet 3   senna (SENOKOT) 8.6 MG tablet Take 2 tablets by mouth daily with supper.     tadalafil  (CIALIS ) 5 MG tablet TAKE 1 TABLET BY MOUTH DAILY 90 tablet 3   traZODone  (DESYREL ) 50 MG tablet Take 0.5-1 tablets (25-50 mg total) by mouth at bedtime as needed for sleep. 30 tablet 3   VITAMIN D PO Take by mouth daily.     No current facility-administered medications on file prior to visit.     ROS see history of present illness  Objective  Physical Exam Vitals:   07/19/23 0808  BP: 110/70  Pulse: 68  Temp: 98.2 F (36.8 C)  SpO2: 91%    BP Readings from Last 3 Encounters:  07/19/23 110/70  07/11/23 134/80  02/21/23 124/80   Wt Readings from  Last 3 Encounters:  07/19/23 201 lb 6.4 oz (91.4 kg)  07/11/23 204 lb (92.5 kg)  02/21/23 203 lb 3.2 oz (92.2 kg)    Physical Exam Constitutional:      General: He is not in acute distress.    Appearance: Normal appearance.  HENT:     Head: Normocephalic.   Cardiovascular:     Rate and Rhythm: Normal rate and regular rhythm.     Heart sounds: Normal heart sounds.  Pulmonary:     Effort: Pulmonary effort is normal.     Breath sounds: Normal breath sounds.  Abdominal:     General: Abdomen is flat.     Palpations: Abdomen is soft.     Tenderness: There is no abdominal tenderness.   Skin:    General: Skin is warm  and dry.   Neurological:     General: No focal deficit present.     Mental Status: He is alert.   Psychiatric:        Mood and Affect: Mood normal.        Behavior: Behavior normal.      Assessment/Plan: Please see individual problem list.   Unilateral inguinal hernia without obstruction or gangrene, recurrence not specified Assessment & Plan: A new left inguinal hernia with fat content is present, with no bowel involvement. Pain is noted. He is open to surgical evaluation. Refer to general surgery for evaluation of the left inguinal hernia.  Orders: -     Ambulatory referral to General Surgery  Chronic constipation Assessment & Plan: Chronic constipation persists with ineffective relief from Senokot. MiraLAX is suggested. Emphasize hydration and fiber intake. Add a capful of MiraLAX daily, ensure adequate hydration, and increase dietary fiber intake. Review the chart to identify previously effective medication.   LLQ pain Assessment & Plan: Left-sided abdominal pain is likely related to diverticulitis and constipation. Completed antibiotics for diverticulitis. A follow-up colonoscopy is recommended. Ensure GI referral for colonoscopy within six weeks.   Fatty liver Assessment & Plan: Fatty liver is noted on CT scan. Emphasize diet and exercise to prevent progression. Monitor liver enzymes regularly. Encourage a healthy diet and exercise to promote weight loss.   Aortic atherosclerosis (HCC) Assessment & Plan: Noted on prior imaging. Coronary calcium  score completed. LDL levels are well-controlled. Emphasize risk factor modification. Continue cholesterol medication, maintain a heart-healthy diet, and monitor blood pressure regularly.      Return in about 4 weeks (around 08/16/2023) for Follow up.   Bluford Burkitt, NP-C [facility]

## 2023-07-19 NOTE — Telephone Encounter (Signed)
 Copied from CRM [id number]. Topic: General - Other >> Jul 19, 2023  3:33 PM Dorisann Garre T wrote: Reason for CRM patient called back in to let the dr know this is the medication he is requesting for constipation  medication Trulance  3mg 

## 2023-07-20 ENCOUNTER — Other Ambulatory Visit: Payer: Self-pay | Admitting: Nurse Practitioner

## 2023-07-20 DIAGNOSIS — K5909 Other constipation: Secondary | ICD-10-CM

## 2023-07-20 MED ORDER — TRULANCE 3 MG PO TABS
3.0000 mg | ORAL_TABLET | Freq: Every day | ORAL | 1 refills | Status: AC | PRN
Start: 2023-07-20 — End: ?

## 2023-07-20 NOTE — Telephone Encounter (Signed)
 I do not see trulance  in pts med list

## 2023-07-20 NOTE — Telephone Encounter (Signed)
 Pt has been informed.

## 2023-07-20 NOTE — Telephone Encounter (Signed)
 Patient is needing a call back regarding his medication

## 2023-07-21 ENCOUNTER — Telehealth: Payer: Self-pay

## 2023-07-21 NOTE — Telephone Encounter (Signed)
 Copied from CRM [id number]. Topic: Referral - Status >> Jul 21, 2023  3:42 PM Allyne Areola wrote: Reason for CRM: Patient was referred to [facility] and received a call to schedule the appointment; however, they did not have anything available until August. He is concerned due to CT results and would like to know if there was any way the office can help to expedite the appointment.

## 2023-07-24 NOTE — Telephone Encounter (Unsigned)
 Copied from CRM [id number]. Topic: General - Other >> Jul 24, 2023  8:05 AM Albertha Alosa wrote: Reason for CRM: Patient called in regarding getting referred to see General Surgery regarding his hernia, would like for NP Bluford Burkitt or her nurses to give him a callback regarding the status of this

## 2023-07-25 NOTE — Telephone Encounter (Signed)
Mychart msg sent to pt informing.

## 2023-07-27 ENCOUNTER — Encounter: Payer: Self-pay | Admitting: Nurse Practitioner

## 2023-07-27 ENCOUNTER — Ambulatory Visit (INDEPENDENT_AMBULATORY_CARE_PROVIDER_SITE_OTHER): Admitting: Surgery

## 2023-07-27 ENCOUNTER — Encounter: Payer: Self-pay | Admitting: Surgery

## 2023-07-27 VITALS — BP 132/76 | HR 57 | Temp 98.1°F | Ht 70.0 in | Wt 200.0 lb

## 2023-07-27 DIAGNOSIS — K4090 Unilateral inguinal hernia, without obstruction or gangrene, not specified as recurrent: Secondary | ICD-10-CM

## 2023-07-27 DIAGNOSIS — K760 Fatty (change of) liver, not elsewhere classified: Secondary | ICD-10-CM | POA: Insufficient documentation

## 2023-07-27 NOTE — Assessment & Plan Note (Signed)
 Left-sided abdominal pain is likely related to diverticulitis and constipation. Completed antibiotics for diverticulitis. A follow-up colonoscopy is recommended. Ensure GI referral for colonoscopy within six weeks.

## 2023-07-27 NOTE — Assessment & Plan Note (Signed)
 Noted on prior imaging. Coronary calcium  score completed. LDL levels are well-controlled. Emphasize risk factor modification. Continue cholesterol medication, maintain a heart-healthy diet, and monitor blood pressure regularly.

## 2023-07-27 NOTE — Assessment & Plan Note (Signed)
 Chronic constipation persists with ineffective relief from Senokot. MiraLAX is suggested. Emphasize hydration and fiber intake. Add a capful of MiraLAX daily, ensure adequate hydration, and increase dietary fiber intake. Review the chart to identify previously effective medication.

## 2023-07-27 NOTE — Progress Notes (Signed)
 Patient ID: [name]., male   DOB: 14-Oct-1955, 68 y.o.   MRN: [id number]  Chief Complaint:  Left groin/LLQ pain.   History of Present Illness [name]. is a 68 y.o. male with a prior history of diverticulitis, and some increasing urinary frequency, has a CT showing a fat filled left inguinal hernia as well.  No prior hx/ of bulge, doesn't appear to be exacerbated by cough or sneeze, and doesn't do much heavy lifting.  Denies any bowel issues, and had a prior RIH repaired sometime ago.  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 CHADS2VASc => 0; b. on Eliquis  for pending ablation       Past Surgical History:  Procedure Laterality Date   ATRIAL FLUTTER ABLATION  02/17/2016   COLONOSCOPY  2014   COLONOSCOPY WITH PROPOFOL  N/A 06/30/2016   Procedure: COLONOSCOPY WITH PROPOFOL ;  Surgeon: Marnee Sink, MD;  Location: [facility];  Service: Endoscopy;  Laterality: N/A;   COLONOSCOPY WITH PROPOFOL  N/A 07/07/2022   Procedure: COLONOSCOPY WITH PROPOFOLwith polyopectomy;  Surgeon: Marnee Sink, MD;  Location: [facility];  Service: Endoscopy;   Laterality: N/A;   ELECTROPHYSIOLOGIC STUDY N/A 01/04/2016   Procedure: Cardioversion;  Surgeon: Wenona Hamilton, MD;  Location: [facility] OR[facility];  Service: Cardiovascular;  Laterality: N/A;   ELECTROPHYSIOLOGIC STUDY N/A 02/17/2016   Procedure: A-Flutter Ablation;  Surgeon: Verona Goodwill, MD;  Location: [facility];  Service: Cardiovascular;  Laterality: N/A;   ESOPHAGOGASTRODUODENOSCOPY (EGD) WITH PROPOFOL  N/A 07/07/2022   Procedure: ESOPHAGOGASTRODUODENOSCOPY (EGD) WITH PROPOFOL ;  Surgeon: Marnee Sink, MD;  Location: [facility];  Service: Endoscopy;  Laterality: N/A;  balloon dilation 18mm   GANGLION CYST EXCISION Right 1994   wrist & back   HERNIA REPAIR Right 1994   abdominal repair with mesh   NASAL SEPTUM SURGERY  2011   [employer]     Allergies  Allergen Reactions   Other Other (See Comments)    Pollen:  Sinus infection, cough, fatigue    Current Outpatient Medications  Medication Sig Dispense Refill   ADVAIR  HFA 115-21 MCG/ACT inhaler USE 2 INHALATIONS BY MOUTH TWICE DAILY 36 g 3   albuterol  (VENTOLIN  HFA) 108 (90 Base) MCG/ACT inhaler Inhale 2 puffs into the lungs every 4 (four) hours as needed for wheezing or shortness of breath. 8 g 2   atorvastatin  (LIPITOR) 40 MG tablet Take 1 tablet (40 mg total) by  mouth every morning. 90 tablet 3   finasteride  (PROSCAR ) 5 MG tablet Take 1 tablet (5 mg total) by mouth daily. 90 tablet 0   losartan  (COZAAR ) 50 MG tablet Take 1 tablet (50 mg total) by mouth daily. 90 tablet 0   Multiple Vitamin (MULTIVITAMIN) tablet Take 1 tablet by mouth daily.     pantoprazole  (PROTONIX ) 40 MG tablet Take 1 tablet (40 mg total) by mouth daily. 90 tablet 3   Plecanatide  (TRULANCE ) 3 MG TABS Take 1 tablet (3 mg total) by mouth daily as needed (constipation). 90 tablet 1   senna (SENOKOT) 8.6 MG tablet Take 2 tablets by mouth daily with supper.     tadalafil  (CIALIS ) 5 MG tablet TAKE 1 TABLET BY MOUTH DAILY 90 tablet 3   traZODone  (DESYREL ) 50  MG tablet Take 0.5-1 tablets (25-50 mg total) by mouth at bedtime as needed for sleep. 30 tablet 3   VITAMIN D PO Take by mouth daily.     No current facility-administered medications for this visit.    Family History Family History  Problem Relation Age of Onset   Hyperlipidemia Mother    Cancer Mother        skin cancer?   Heart disease Mother 49       MI - died in her sleep   Hyperlipidemia Father    Heart disease Father    Kidney disease Neg Hx    Prostate cancer Neg Hx    Kidney cancer Neg Hx    Bladder Cancer Neg Hx       Social History Social History   Tobacco Use   Smoking status: Former    Current packs/day: 0.00    Average packs/day: 1 pack/day for 20.0 years (20.0 ttl pk-yrs)    Types: Cigarettes    Start date: 02/08/1971    Quit date: 02/08/1991    Years since quitting: 32.4   Smokeless tobacco: Never  Vaping Use   Vaping status: Never Used  Substance Use Topics   Alcohol use: No    Alcohol/week: 0.0 standard drinks of alcohol   Drug use: No        Review of Systems  All other systems reviewed and are negative.    Physical Exam Blood pressure 132/76, pulse (!) 57, temperature 98.1 F (36.7 C), temperature source Oral, height 5' 10 (1.778 m), weight 200 lb (90.7 kg), SpO2 99%. Last Weight  Most recent update: 07/27/2023  3:16 PM    Weight  90.7 kg (200 lb)             CONSTITUTIONAL: Well developed, and nourished, appropriately responsive and aware without distress.   EYES: Sclera non-icteric.   EARS, NOSE, MOUTH AND THROAT:  The oropharynx is clear. Oral mucosa is pink and moist.    Hearing is intact to voice.  NECK: Trachea is midline, and there is no jugular venous distension.  LYMPH NODES:  Lymph nodes in the neck are not appreciated. RESPIRATORY:  Lungs are clear, and breath sounds are equal bilaterally.  Normal respiratory effort without pathologic use of accessory muscles. CARDIOVASCULAR: Heart is regular in rate and rhythm.   Well  perfused.  GI: The abdomen is  soft, nontender, and nondistended. There were no palpable masses.  I did not appreciate hepatosplenomegaly.  GU: Testes descended bilaterally, no appreciable hernia on the right side, there is a mobile lipomatous presents with Valsalva on the left.  No appreciable feel for hernia sac, no appreciable bulge. MUSCULOSKELETAL:  Symmetrical  muscle tone appreciated in all four extremities.    SKIN: Skin turgor is normal. No pathologic skin lesions appreciated.  NEUROLOGIC:  Motor and sensation appear grossly normal.  Cranial nerves are grossly without defect. PSYCH:  Alert and oriented to person, place and time. Affect is appropriate for situation.  Data Reviewed I have personally reviewed what is currently available of the patient's imaging, recent labs and medical records.   Labs:     Latest Ref Rng & Units 07/11/2023    1:46 PM 02/21/2023    9:29 AM 08/20/2020    1:17 AM  CBC  WBC 4.0 - 10.5 K/uL 9.4  5.6  6.4   Hemoglobin 13.0 - 17.0 g/dL 16.1  09.6  04.5   Hematocrit 39.0 - 52.0 % 44.1  45.5  39.1   Platelets 150.0 - 400.0 K/uL 203.0  260.0  169       Latest Ref Rng & Units 07/13/2023    7:42 AM 07/11/2023    1:46 PM 02/21/2023    9:29 AM  CMP  Glucose 70 - 99 mg/dL  78  71   BUN 6 - 23 mg/dL  19  17   Creatinine 4.09 - 1.50 mg/dL  8.11  9.14   Sodium 782 - 145 mEq/L  140  144   Potassium 3.5 - 5.1 mEq/L  4.2  4.5   Chloride 96 - 112 mEq/L  103  105   CO2 19 - 32 mEq/L  30  30   Calcium  8.4 - 10.5 mg/dL  9.8  9.9   Total Protein 6.0 - 8.3 g/dL  6.7  6.5   Total Bilirubin 0.2 - 1.2 mg/dL 1.9  1.9  1.5   Alkaline Phos 39 - 117 U/L  67  74   AST 0 - 37 U/L  17  19   ALT 0 - 53 U/L  15  18       Imaging: Radiological images personally reviewed:  CLINICAL DATA:  LLQ abdominal pain concern for diverticulitis   EXAM: CT ABDOMEN AND PELVIS WITH CONTRAST   TECHNIQUE: Multidetector CT imaging of the abdomen and pelvis was performed using the standard  protocol following bolus administration of intravenous contrast.   RADIATION DOSE REDUCTION: This exam was performed according to the departmental dose-optimization program which includes automated exposure control, adjustment of the mA and/or kV according to patient size and/or use of iterative reconstruction technique.   CONTRAST:  80mL OMNIPAQUE  IOHEXOL  350 MG/ML SOLN   COMPARISON:  CT 05/10/2016   FINDINGS: Lower chest: Clear lung bases.   Hepatobiliary: Mild diffuse hepatic steatosis. No suspicious liver lesion. Decompressed gallbladder. No biliary dilatation.   Pancreas: Mild fatty atrophy.  No ductal dilatation or inflammation.   Spleen: Normal in size without focal abnormality.   Adrenals/Urinary Tract: Normal adrenal glands. No hydronephrosis or renal calculi. Right renal simple cyst. Left renal hypodensities are too small to characterize. No further follow-up imaging is recommended. Nondistended urinary bladder.   Stomach/Bowel: Inflamed diverticulum in the proximal sigmoid colon, series 5, image 36, with mild associated colonic wall thickening and pericolonic edema consistent diverticulitis. No perforation or abscess. Moderate volume of stool throughout the colon. Normal appendix no small bowel obstruction or inflammation. Stomach is nondistended.   Vascular/Lymphatic: Aortic atherosclerosis. No aortic aneurysm. Patent portal and mesenteric veins. No enlarged lymph nodes in the abdomen or pelvis.   Reproductive: Prominent prostate spans 4.5 cm transverse.   Other: Fat stranding and inflammation in the left lower  quadrant. Trace free fluid but no focal fluid collection. No free air. Small fat containing left inguinal hernia. Fatty defect of the right lateral abdominal wall musculature may represent a lipoma. Previous right-sided abdominal wall hernia repair.   Musculoskeletal: There are no acute or suspicious osseous abnormalities.   IMPRESSION: 1. Acute  uncomplicated sigmoid diverticulitis. 2. Mild hepatic steatosis.   Aortic Atherosclerosis (ICD10-I70.0).     Electronically Signed   By: Chadwick Colonel M.D.   On: 07/11/2023 16:46 Within last 24 hrs: No results found.  Assessment    Left inguinal hernia, fat filled, and associated with some left lower quadrant pain/discomfort. Patient Active Problem List   Diagnosis Date Noted   Unilateral inguinal hernia without obstruction or gangrene 07/27/2023   Fatty liver 07/27/2023   LLQ pain 07/11/2023   PND (post-nasal drip) 02/21/2023   Insomnia 10/31/2022   History of colonic polyps 07/07/2022   Dyspepsia 07/07/2022   Vision changes 02/18/2022   Rosacea 11/12/2021   COPD (chronic obstructive pulmonary disease) (HCC) 09/15/2021   Aortic atherosclerosis (HCC) 09/15/2021   T9 vertebral fracture, sequela 03/25/2021   Levoscoliosis of thoracic spine 02/25/2021   Chronic midline thoracic back pain 02/25/2021   Claustrophobia 02/04/2021   Thoracic compression fracture, sequela (T1 & T2) 02/04/2021   Cervicalgia 01/19/2021   Spondylosis without myelopathy or radiculopathy, cervical region 01/19/2021   Long term prescription benzodiazepine use 01/04/2021   Chronic neck pain (1ry area of Pain) (Bilateral) (L>R) 01/04/2021   Cervicogenic headache (3ry area of Pain) (Bilateral) 01/04/2021   Lumbosacral radiculopathy at S1 (Left) 01/04/2021   Chronic upper back pain (6th area of Pain) 01/04/2021   Cervical facet syndrome (Bilateral) 01/04/2021   Abnormal MRI, lumbar spine (11/18/2017) 01/04/2021   DDD (degenerative disc disease), lumbar 01/04/2021   Lumbar facet arthropathy (Multilevel) (Bilateral) 01/04/2021   Lumbar facet syndrome 01/04/2021   Abnormal MRI, cervical spine (02/17/2021) 01/04/2021   DDD (degenerative disc disease), cervical 01/04/2021   Cervical facet hypertrophy 01/04/2021   Grade 1 Cervical Retrolisthesis of C4/C5 and C5/C6 01/04/2021   Chronic pain syndrome  01/01/2021   Pharmacologic therapy 01/01/2021   Disorder of skeletal system 01/01/2021   Problems influencing health status 01/01/2021   Skin cyst 06/03/2020   Epididymal cyst 06/03/2020   History of Helicobacter pylori infection 05/17/2019   GERD (gastroesophageal reflux disease) 01/16/2018   Sleep apnea 01/16/2018   Chronic upper extremity pain (2ry area of Pain) (Bilateral) (L>R) 12/13/2017   Chronic lower extremity pain (5th area of Pain) (Bilateral) (L>R) 12/13/2017   Chronic cervical radiculopathy (C7, C8) (Left) 01/23/2017   Hypertension 01/23/2017   Chronic constipation 05/19/2016   Chronic low back pain with left-sided sciatica 03/30/2016   History of atrial flutter    Elevated PSA 12/24/2015   Dyspnea    Adjustment disorder with mixed anxiety and depressed mood 07/23/2015   OAB (overactive bladder) 03/31/2015   Viral upper respiratory illness 11/04/2014   BPH (benign prostatic hyperplasia) 11/19/2013   Hyperlipidemia 11/19/2013   Cervical foraminal stenosis (C4-5, C5-6) (Left) 03/08/2013   Lipoma of back 03/08/2013   Benign prostatic hyperplasia with urinary obstruction 08/23/2012   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/2012   Colon polyp 04/18/2011    Plan    Due to his recent change in urinary frequency, and the history of diverticulitis, it is hard to be entirely certain that repair of this fat filled left inguinal canal will resolve any of the pain issues as his symptoms are not quite  definitive.  Offered him to proceed with repair, I believe at this time he would like to continue to observe and defer left inguinal hernia repair.  I told him we will gladly follow him up in a month or so and see how he is progressing.  I also reviewed issues or concerns that may arise and additional symptoms that may indicate need to obtain further evaluation.   Face-to-face time spent with the patient and accompanying care providers(if present) was 45 minutes, spent  counseling, educating, and coordinating care of the patient.    These notes generated with voice recognition software. I apologize for typographical errors.  Flynn Hylan M.D., FACS 07/28/2023, 5:16 PM

## 2023-07-27 NOTE — Patient Instructions (Signed)
 Groin Hernia (Inguinal Hernia) in Adults: What to Know  A hernia happens when an organ or tissue in your body pushes out through a weak spot in the muscles of your belly. This makes a bulge. A groin hernia is also called an inguinal hernia. It's found in your groin, which is the area where your leg meets your lower belly. This kind of hernia could also be: In your scrotum, if you're male. In the folds of skin around your vagina, if you're male. You may be able to push the bulge back into your belly. If you can't push it in and blood flow is cut off to the hernia, you'll need surgery right away. What are the causes? A groin hernia may happen when you strain your belly muscles, such as when you: Lift a heavy object. Strain to poop. Cough. What increases the risk? You may be more likely to get a groin hernia if: You're male. You're 50 years or older. You're pregnant. You've had a groin hernia or belly surgery before. You smoke. You're overweight. You work at a job where you need to stand a lot or lift heavy things. What are the signs or symptoms? Symptoms may depend on how big the hernia is. If it's small, you may not have symptoms. If it's bigger, you may have: A bulge near your groin or genitals. Pain or burning in your groin. A dull ache or feeling of pressure in your groin. If blood flow is cut off to the tissues inside the hernia, you may also: Feel pain and tenderness when you touch the bulge. The skin over it may turn red or purple. Have a fever. Throw up or feel like you may throw up. Have trouble pooping or passing gas. How is this treated? Treatment depends on how big the hernia is and what symptoms you have. You may need: To be watched to see if the bulge grows bigger. Surgery. This may be done if the hernia is big or if you have symptoms. Follow these instructions at home: Lifestyle Ask if it's OK for you to lift. Try not to stand for long periods of time. Do not  smoke, vape, or use nicotine or tobacco. Stay at a healthy weight. Try not to do things that put pressure on your hernia. Preventing trouble pooping You may need to take these steps to help prevent or treat trouble pooping (constipation): Take medicines to help you poop. Eat foods high in fiber, like beans, whole grains, and fresh fruits and vegetables. Drink more fluids as told. General instructions Try to push the hernia back in place by very gently pressing on it while lying down. Do not try to force it back in if it won't push in easily. Watch your hernia for any changes in: Shape. Size. Color. Take your medicines only as told. Contact a doctor if: You have a fever. You have new symptoms. Your symptoms get worse. You can't poop or pass gas. Get help right away if: Your bulge: Starts to hurt a lot. Changes color. You have sudden pain in your scrotum, or your scrotum changes size. You can't gently push the hernia back in place. You feel like you may vomit, and that feeling does not go away. You keep throwing up or feeling like you need to throw up. These symptoms may be an emergency. Call 911 right away. Do not wait to see if the symptoms will go away. Do not drive yourself to the hospital. This information is  not intended to replace advice given to you by your health care provider. Make sure you discuss any questions you have with your health care provider. Document Revised: 09/22/2022 Document Reviewed: 09/22/2022 Elsevier Patient Education  2024 [employer].

## 2023-07-27 NOTE — Assessment & Plan Note (Signed)
 A new left inguinal hernia with fat content is present, with no bowel involvement. Pain is noted. He is open to surgical evaluation. Refer to general surgery for evaluation of the left inguinal hernia.

## 2023-07-27 NOTE — Assessment & Plan Note (Signed)
 Fatty liver is noted on CT scan. Emphasize diet and exercise to prevent progression. Monitor liver enzymes regularly. Encourage a healthy diet and exercise to promote weight loss.

## 2023-07-31 ENCOUNTER — Other Ambulatory Visit: Payer: Self-pay

## 2023-07-31 DIAGNOSIS — R972 Elevated prostate specific antigen [PSA]: Secondary | ICD-10-CM

## 2023-07-31 MED ORDER — FINASTERIDE 5 MG PO TABS: 5.0000 mg | ORAL_TABLET | Freq: Every day | ORAL | 0 refills | Status: DC

## 2023-08-01 ENCOUNTER — Ambulatory Visit: Admitting: Surgery

## 2023-08-14 NOTE — Progress Notes (Incomplete)
 11:46 PM   [name]. 30-Mar-1955 [id number]  Referring provider: Gretel App, NP [address],  [place] [address]  Urological history: 1. BPH with LU TS -PSA (07/2023) 1.07 -tadalafil  5 mg daily and finasteride  5 mg daily ***  2. Nocturia -Myrbetriq  cost prohibitive -Gemtesa  cost prohibitive  3. Erectile dysfunction -not sexually active  4. Epididymal cyst -scrotal ultrasound (2015) 3 mm left epididymal cyst   5. Renal cyst -contrast CT 2018 -  3.2 cm right mid pole renal cyst -contrast  CT (07/2023) - right simple cyst  No chief complaint on file.   HPI: [name] a 68 y.o. man who presents today for frequency.    Previous records reviewed.   Hbg A1c (07/2023) 5.8  UA (07/2023) negative  Urine culture ([id number]) no growth  Serum creatintine (07/2023) 0.89, e GFR 88.87  PVR ***  PMH: Past Medical History:  Diagnosis Date  . Anxiety   . Arthritis   . BPH (benign prostatic hyperplasia) 11/19/2013  . 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 Bulging disc    C2/3, C3/4, C6/7  . Cervical disc herniation    C4/5 and C5/6  . Claustrophobia   . Colon polyp 04/18/2011  . Constipation 08/31/2012  . COPD (chronic obstructive pulmonary disease) (HCC)   . Depression   . Diverticulosis   . 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 GERD (gastroesophageal reflux disease)   . H/O diverticulitis of colon   . Hemorrhoid   . Hiatal hernia   . Hx of atrial flutter   . Hyperlipidemia   . Levoscoliosis   . Motion sickness    boats, planes  . Sleep apnea    No CPAP  . Sleep difficulties   . Spinal stenosis in cervical region    cord abutment C4/5  . Testicular pain, left   . Typical atrial flutter (HCC) 11/2015   a. CHADS2VASc => 0; b. on Eliquis  for pending ablation     Surgical History: Past Surgical History:  Procedure Laterality Date  . ATRIAL FLUTTER ABLATION  02/17/2016  .  COLONOSCOPY  2014  . COLONOSCOPY WITH PROPOFOL  N/A 06/30/2016   Procedure: COLONOSCOPY WITH PROPOFOL ;  Surgeon: Jinny Carmine, MD;  Location: [facility];  Service: Endoscopy;  Laterality: N/A;  . COLONOSCOPY WITH PROPOFOL  N/A 07/07/2022   Procedure: COLONOSCOPY WITH PROPOFOLwith polyopectomy;  Surgeon: Jinny Carmine, MD;  Location: [facility];  Service: Endoscopy;  Laterality: N/A;  . ELECTROPHYSIOLOGIC STUDY N/A 01/04/2016   Procedure: Cardioversion;  Surgeon: Deatrice DELENA Cage, MD;  Location: [facility];  Service: Cardiovascular;  Laterality: N/A;  . ELECTROPHYSIOLOGIC STUDY N/A 02/17/2016   Procedure: A-Flutter Ablation;  Surgeon: Elspeth JAYSON Sage, MD;  Location: [facility];  Service: Cardiovascular;  Laterality: N/A;  . ESOPHAGOGASTRODUODENOSCOPY (EGD) WITH PROPOFOL  N/A 07/07/2022   Procedure: ESOPHAGOGASTRODUODENOSCOPY (EGD) WITH PROPOFOL ;  Surgeon: Jinny Carmine, MD;  Location: [facility];  Service: Endoscopy;  Laterality: N/A;  balloon dilation 18mm  . GANGLION CYST EXCISION Right 1994   wrist & back  . HERNIA REPAIR Right 1994   abdominal repair with mesh  . NASAL SEPTUM SURGERY  2011   Dr. Blair     Home Medications:  Allergies as of 08/15/2023       Reactions   Other Other (See Comments)   Pollen:  Sinus infection, cough, fatigue  Medication List        Accurate as of August 14, 2023 11:46 PM. If you have any questions, ask your nurse or doctor.          Advair  HFA 115-21 MCG/ACT inhaler Generic drug: fluticasone -salmeterol USE 2 INHALATIONS BY MOUTH TWICE DAILY   albuterol  108 (90 Base) MCG/ACT inhaler Commonly known as: VENTOLIN  HFA Inhale 2 puffs into the lungs every 4 (four) hours as needed for wheezing or shortness of breath.   atorvastatin  40 MG tablet Commonly known as: LIPITOR Take 1 tablet (40 mg total) by mouth every morning.   finasteride  5 MG tablet Commonly known as: PROSCAR  Take 1 tablet (5 mg total) by mouth  daily.   losartan  50 MG tablet Commonly known as: COZAAR  Take 1 tablet (50 mg total) by mouth daily.   multivitamin tablet Take 1 tablet by mouth daily.   pantoprazole  40 MG tablet Commonly known as: PROTONIX  Take 1 tablet (40 mg total) by mouth daily.   senna 8.6 MG tablet Commonly known as: SENOKOT Take 2 tablets by mouth daily with supper.   tadalafil  5 MG tablet Commonly known as: CIALIS  TAKE 1 TABLET BY MOUTH DAILY   traZODone  50 MG tablet Commonly known as: DESYREL  Take 0.5-1 tablets (25-50 mg total) by mouth at bedtime as needed for sleep.   Trulance  3 MG Tabs Generic drug: Plecanatide  Take 1 tablet (3 mg total) by mouth daily as needed (constipation).   VITAMIN D PO Take by mouth daily.        Allergies:  Allergies  Allergen Reactions  . Other Other (See Comments)    Pollen:  Sinus infection, cough, fatigue    Family History: Family History  Problem Relation Age of Onset  . Hyperlipidemia Mother   . Cancer Mother        skin cancer?  . Heart disease Mother 82       MI - died in her sleep  . Hyperlipidemia Father   . Heart disease Father   . Kidney disease Neg Hx   . Prostate cancer Neg Hx   . Kidney cancer Neg Hx   . Bladder Cancer Neg Hx     Social History:  reports that he quit smoking about 32 years ago. His smoking use included cigarettes. He started smoking about 52 years ago. He has a 20 pack-year smoking history. He has never used smokeless tobacco. He reports that he does not drink alcohol and does not use drugs.  ROS: For pertinent review of systems please refer to history of present illness  Physical Exam: There were no vitals taken for this visit.  Constitutional:  Well nourished. Alert and oriented, No acute distress. HEENT: [place] AT, moist mucus membranes.  Trachea midline, no masses. Cardiovascular: No clubbing, cyanosis, or edema. Respiratory: Normal respiratory effort, no increased work of breathing. GI: Abdomen is soft, non  tender, non distended, no abdominal masses. Liver and spleen not palpable.  No hernias appreciated.  Stool sample for occult testing is not indicated.   GU: No CVA tenderness.  No bladder fullness or masses.  Patient with circumcised/uncircumcised phallus. ***Foreskin easily retracted***  Urethral meatus is patent.  No penile discharge. No penile lesions or rashes. Scrotum without lesions, cysts, rashes and/or edema.  Testicles are located scrotally bilaterally. No masses are appreciated in the testicles. Left and right epididymis are normal. Rectal: Patient with  normal sphincter tone. Anus and perineum without scarring or rashes. No rectal masses are appreciated. Prostate is  approximately *** grams, *** nodules are appreciated. Seminal vesicles are normal. Skin: No rashes, bruises or suspicious lesions. Lymph: No cervical or inguinal adenopathy. Neurologic: Grossly intact, no focal deficits, moving all 4 extremities. Psychiatric: Normal mood and affect.   Laboratory Data: See EPIC and HPI I have reviewed the labs.  Pertinent Imaging: ***   Assessment & Plan:    1. Frequency  2. BPH with LUTS -PSA up-to-date -PVR     3. OAB -   4. ED -not sexually active  5. Epididymal cysts -5 mm bilateral epididymal cysts -Manage conservatively  6. Bilateral hydrocele -  These notes generated with voice recognition software. I apologize for typographical errors.  CLOTILDA HELON RIGGERS  [facility] [address] [place], [place] [address] [phone]

## 2023-08-14 NOTE — Progress Notes (Signed)
 10:17 AM   [name]. May 17, 1955 [id number]  Referring provider: Gretel App, NP [address]  [place] [address]  Urological history: 1. BPH with LU TS -PSA (07/2023) 1.07 -tadalafil  5 mg daily and finasteride  5 mg daily   2. Nocturia -Myrbetriq  cost prohibitive -Gemtesa  cost prohibitive  3. Erectile dysfunction -not sexually active  4. Epididymal cyst -scrotal ultrasound (2015) 3 mm left epididymal cyst   5. Renal cyst -contrast CT 2018 -  3.2 cm right mid pole renal cyst -contrast  CT (07/2023) - right simple cyst  Chief Complaint  Patient presents with   Urinary Frequency    HPI: Tyler Ayalais a 68 y.o. man who presents today for frequency.    Previous records reviewed.   A week prior to his diagnosis of diverticulitis he started noticed urinary urgency and frequency.  He was making trips to the restroom every hour.  His nocturia is x 2 and that is stable.  Patient denies any modifying or aggravating factors.  Patient denies any recent UTI's, gross hematuria, dysuria or suprapubic/flank pain.  Patient denies any fevers, chills, nausea or vomiting.    He is also dealing with issues with constipation.  PSA (07/2023) 1.07  Hbg A1c (07/2023) 5.8  UA (07/2023) negative  Urine culture ([id number]) no growth  Serum creatintine (07/2023) 0.89, e GFR 88.87  PVR 47 mL   PMH: Past Medical History:  Diagnosis Date   Anxiety    Arthritis    BPH (benign prostatic hyperplasia) 11/19/2013   Bulge of lumbar disc without myelopathy    L2/3 through L5/6   Bulging disc    C2/3, C3/4, C6/7   Cervical disc herniation    C4/5 and C5/6   Claustrophobia    Colon polyp 04/18/2011   Constipation 08/31/2012   COPD (chronic obstructive pulmonary disease) (HCC)    Depression    Diverticulosis    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/2012   GERD (gastroesophageal reflux disease)    H/O diverticulitis of colon    Hemorrhoid     Hiatal hernia    Hx of atrial flutter    Hyperlipidemia    Levoscoliosis    Motion sickness    boats, planes   Sleep apnea    No CPAP   Sleep difficulties    Spinal stenosis in cervical region    cord abutment C4/5   Testicular pain, left    Typical atrial flutter (HCC) 11/2015   a. CHADS2VASc => 0; b. on Eliquis  for pending ablation     Surgical History: Past Surgical History:  Procedure Laterality Date   ATRIAL FLUTTER ABLATION  02/17/2016   COLONOSCOPY  2014   COLONOSCOPY WITH PROPOFOL  N/A 06/30/2016   Procedure: COLONOSCOPY WITH PROPOFOL ;  Surgeon: Jinny Carmine, MD;  [facility];  Service: Endoscopy;  Laterality: N/A;   COLONOSCOPY WITH PROPOFOL  N/A 07/07/2022   Procedure: COLONOSCOPY WITH PROPOFOLwith polyopectomy;  Surgeon: Jinny Carmine, MD;  Location: [facility];  Service: Endoscopy;  Laterality: N/A;   ELECTROPHYSIOLOGIC STUDY N/A 01/04/2016   Procedure: Cardioversion;  Surgeon: Deatrice DELENA Cage, MD;  Location: [facility];  Service: Cardiovascular;  Laterality: N/A;   ELECTROPHYSIOLOGIC STUDY N/A 02/17/2016   Procedure: A-Flutter Ablation;  Surgeon: Elspeth JAYSON Sage, MD;  Location: [facility] IN[facility]ASIVE CV LAB;  Service: Cardiovascular;  Laterality: N/A;   ESOPHAGOGASTRODUODENOSCOPY (EGD) WITH PROPOFOL  N/A 07/07/2022   Procedure: ESOPHAGOGASTRODUODENOSCOPY (EGD) WITH PROPOFOL ;  Surgeon: Jinny Carmine, MD;  Location: [facility];  Service: Endoscopy;  Laterality: N/A;  balloon dilation 18mm   GANGLION CYST EXCISION Right 1994   wrist & back   HERNIA REPAIR Right 1994   abdominal repair with mesh   NASAL SEPTUM SURGERY  2011   Dr. Blair     Home Medications:  Allergies as of 08/15/2023       Reactions   Other Other (See Comments)   Pollen:  Sinus infection, cough, fatigue        Medication List        Accurate as of August 15, 2023 10:17 AM. If you have any questions, ask your nurse or doctor.          Advair  HFA 115-21 MCG/ACT  inhaler Generic drug: fluticasone -salmeterol USE 2 INHALATIONS BY MOUTH TWICE DAILY   albuterol  108 (90 Base) MCG/ACT inhaler Commonly known as: VENTOLIN  HFA Inhale 2 puffs into the lungs every 4 (four) hours as needed for wheezing or shortness of breath.   atorvastatin  40 MG tablet Commonly known as: LIPITOR Take 1 tablet (40 mg total) by mouth every morning.   finasteride  5 MG tablet Commonly known as: PROSCAR  Take 1 tablet (5 mg total) by mouth daily.   Gemtesa  75 MG Tabs Generic drug: Vibegron  Take 1 tablet (75 mg total) by mouth daily. Started by: Brinae [place]   losartan  50 MG tablet Commonly known as: COZAAR  Take 1 tablet (50 mg total) by mouth daily.   multivitamin tablet Take 1 tablet by mouth daily.   pantoprazole  40 MG tablet Commonly known as: PROTONIX  Take 1 tablet (40 mg total) by mouth daily.   senna 8.6 MG tablet Commonly known as: SENOKOT Take 2 tablets by mouth daily with supper.   tadalafil  5 MG tablet Commonly known as: CIALIS  TAKE 1 TABLET BY MOUTH DAILY   traZODone  50 MG tablet Commonly known as: DESYREL  Take 0.5-1 tablets (25-50 mg total) by mouth at bedtime as needed for sleep.   Trulance  3 MG Tabs Generic drug: Plecanatide  Take 1 tablet (3 mg total) by mouth daily as needed (constipation).   VITAMIN D PO Take by mouth daily.        Allergies:  Allergies  Allergen Reactions   Other Other (See Comments)    Pollen:  Sinus infection, cough, fatigue    Family History: Family History  Problem Relation Age of Onset   Hyperlipidemia Mother    Cancer Mother        skin cancer?   Heart disease Mother 53       MI - died in her sleep   Hyperlipidemia Father    Heart disease Father    Kidney disease Neg Hx    Prostate cancer Neg Hx    Kidney cancer Neg Hx    Bladder Cancer Neg Hx     Social History:  reports that he quit smoking about 32 years ago. His smoking use included cigarettes. He started smoking about 52 years ago. He  has a 20 pack-year smoking history. He has never used smokeless tobacco. He reports that he does not drink alcohol and does not use drugs.  ROS: For pertinent review of systems please refer to history of present illness  Physical Exam: BP 138/74   Pulse (!) 107   Ht 5' 10 (1.778 m)   Wt 200 lb (90.7 kg)   BMI 28.70 kg/m   Constitutional:  Well nourished. Alert and oriented, No acute distress. HEENT: [place] AT, moist mucus membranes.  Trachea midline Cardiovascular: No clubbing, cyanosis, or edema. Respiratory: Normal respiratory effort, no increased work of breathing. Neurologic: Grossly intact, no focal deficits, moving all 4 extremities. Psychiatric: Normal mood and affect.   Laboratory Data: See EPIC and HPI I have reviewed the labs.  Pertinent Imaging:  08/15/23 09:54  Scan Result 47ml     Assessment & Plan:    1. Frequency - He does have a history of bothersome daytime urgency for which I gave him Gemtesa  in 2024, His insurance did not cover the medication and his symptoms lessened - He stating that now the daytime urgency is worse since the diverticulitis - We discussed that maybe the bladder was irritated by the diverticulitis and it is taking time for it to recover - His UA and urine culture were negative at the time of his diagnosis of diverticulitis and he is also being worked up for constipation - In the meantime, we will retry the Gemtesa  75 mg, #28 samples are given to see if we can calm the bladder down - He will follow-up in 1 month - If he finds no relief from the Gemtesa , we may need to consider pursuing cystoscopy for further evaluation of his symptoms  2. BPH with LUTS -PSA up-to-date -PVR demonstrates adequate emptying    These notes generated with voice recognition software. I apologize for typographical errors.  Return in about 1 month (around 09/15/2023) for PVR and symptom recheck .   [name]  [facility] [address] [place], [place] [address] [phone]

## 2023-08-15 ENCOUNTER — Encounter: Payer: Self-pay | Admitting: Urology

## 2023-08-15 ENCOUNTER — Ambulatory Visit (INDEPENDENT_AMBULATORY_CARE_PROVIDER_SITE_OTHER)

## 2023-08-15 ENCOUNTER — Ambulatory Visit (INDEPENDENT_AMBULATORY_CARE_PROVIDER_SITE_OTHER): Admitting: Urology

## 2023-08-15 ENCOUNTER — Ambulatory Visit (INDEPENDENT_AMBULATORY_CARE_PROVIDER_SITE_OTHER): Admitting: Nurse Practitioner

## 2023-08-15 VITALS — BP 138/74 | HR 107 | Ht 70.0 in | Wt 200.0 lb

## 2023-08-15 VITALS — BP 118/70 | HR 61 | Temp 98.0°F | Ht 70.0 in | Wt 201.8 lb

## 2023-08-15 DIAGNOSIS — K5900 Constipation, unspecified: Secondary | ICD-10-CM | POA: Diagnosis not present

## 2023-08-15 DIAGNOSIS — G4700 Insomnia, unspecified: Secondary | ICD-10-CM

## 2023-08-15 DIAGNOSIS — R195 Other fecal abnormalities: Secondary | ICD-10-CM

## 2023-08-15 DIAGNOSIS — J449 Chronic obstructive pulmonary disease, unspecified: Secondary | ICD-10-CM

## 2023-08-15 DIAGNOSIS — K5909 Other constipation: Secondary | ICD-10-CM

## 2023-08-15 DIAGNOSIS — E785 Hyperlipidemia, unspecified: Secondary | ICD-10-CM

## 2023-08-15 DIAGNOSIS — N401 Enlarged prostate with lower urinary tract symptoms: Secondary | ICD-10-CM

## 2023-08-15 DIAGNOSIS — I10 Essential (primary) hypertension: Secondary | ICD-10-CM

## 2023-08-15 DIAGNOSIS — K4090 Unilateral inguinal hernia, without obstruction or gangrene, not specified as recurrent: Secondary | ICD-10-CM

## 2023-08-15 DIAGNOSIS — N138 Other obstructive and reflux uropathy: Secondary | ICD-10-CM | POA: Diagnosis not present

## 2023-08-15 DIAGNOSIS — R350 Frequency of micturition: Secondary | ICD-10-CM | POA: Diagnosis not present

## 2023-08-15 LAB — BLADDER SCAN AMB NON-IMAGING

## 2023-08-15 MED ORDER — GEMTESA 75 MG PO TABS: 75.0000 mg | ORAL_TABLET | Freq: Every day | ORAL | Status: DC

## 2023-08-15 NOTE — Progress Notes (Signed)
 Leron Glance, NP-C Phone: [phone]  [name]. is a 68 y.o. male who presents today for follow up.   Discussed the use of AI scribe software for clinical note transcription with the patient, who gave verbal consent to proceed.  History of Present Illness   [name]. is a 68 year old male with diverticulitis who presents with constipation and abdominal pain.  He has ongoing constipation and abdominal pain. Despite using Trulance , which he finds helpful but not completely effective, he remains constipated. His bowel movements are described as 'thin' and 'like an onion peel.' He uses Trulance  as needed, which sometimes results in diarrhea-like stools, but he still feels backed up. He also uses Senokot at night. The constipation has been persistent since his last episode of diverticulitis.  He experiences abdominal pain, particularly when he needs to urinate or have a bowel movement. The pain is located in the lower abdomen and is exacerbated by the sensation of needing to go to the bathroom. He describes the pain as similar to what he experienced during his diverticulitis episodes.  He reports frequent urination, needing to urinate every hour, and associates some of his abdominal pain with this. He is currently seeing a urologist for this issue.  He has a history of a hernia, which was repaired 35 years ago, and he experiences pain in the same area. The pain does not worsen with coughing or straining, and he has not noticed a visible bulge.  He is currently taking several medications including Trulance  as needed for constipation, Protonix , losartan  for blood pressure, Advair  for COPD, Lipitor for cholesterol, and trazodone  for sleep. He has reduced his intake of regular Coke, switching to Diet Coke, and is attempting to drink more water .  He reports occasional shortness of breath, particularly when lying flat, and uses an incline to sleep due to sleep apnea. He experiences a  tickling sensation in his throat that causes him to cough, which he attempted to treat with Zyrtec without significant improvement.      Social History   Tobacco Use  Smoking Status Former   Current packs/day: 0.00   Average packs/day: 1 pack/day for 20.0 years (20.0 ttl pk-yrs)   Types: Cigarettes   Start date: 02/08/1971   Quit date: 02/08/1991   Years since quitting: 32.5  Smokeless Tobacco Never    Current Outpatient Medications on File Prior to Visit  Medication Sig Dispense Refill   ADVAIR  HFA 115-21 MCG/ACT inhaler USE 2 INHALATIONS BY MOUTH TWICE DAILY 36 g 3   albuterol  (VENTOLIN  HFA) 108 (90 Base) MCG/ACT inhaler Inhale 2 puffs into the lungs every 4 (four) hours as needed for wheezing or shortness of breath. 8 g 2   atorvastatin  (LIPITOR) 40 MG tablet Take 1 tablet (40 mg total) by mouth every morning. 90 tablet 3   finasteride  (PROSCAR ) 5 MG tablet Take 1 tablet (5 mg total) by mouth daily. 90 tablet 0   losartan  (COZAAR ) 50 MG tablet Take 1 tablet (50 mg total) by mouth daily. 90 tablet 0   Multiple Vitamin (MULTIVITAMIN) tablet Take 1 tablet by mouth daily.     pantoprazole  (PROTONIX ) 40 MG tablet Take 1 tablet (40 mg total) by mouth daily. 90 tablet 3   Plecanatide  (TRULANCE ) 3 MG TABS Take 1 tablet (3 mg total) by mouth daily as needed (constipation). 90 tablet 1   senna (SENOKOT) 8.6 MG tablet Take 2 tablets by mouth daily with supper.     tadalafil  (  CIALIS ) 5 MG tablet TAKE 1 TABLET BY MOUTH DAILY 90 tablet 3   traZODone  (DESYREL ) 50 MG tablet Take 0.5-1 tablets (25-50 mg total) by mouth at bedtime as needed for sleep. 30 tablet 3   VITAMIN D PO Take by mouth daily.     Vibegron  (GEMTESA ) 75 MG TABS Take 1 tablet (75 mg total) by mouth daily.     No current facility-administered medications on file prior to visit.     ROS see history of present illness  Objective  Physical Exam Vitals:   08/15/23 0825  BP: 118/70  Pulse: 61  Temp: 98 F (36.7 C)  SpO2: 97%     BP Readings from Last 3 Encounters:  08/15/23 138/74  08/15/23 118/70  07/27/23 132/76   Wt Readings from Last 3 Encounters:  08/15/23 200 lb (90.7 kg)  08/15/23 201 lb 12.8 oz (91.5 kg)  07/27/23 200 lb (90.7 kg)    Physical Exam Constitutional:      General: He is not in acute distress.    Appearance: Normal appearance.  HENT:     Head: Normocephalic.  Cardiovascular:     Rate and Rhythm: Normal rate and regular rhythm.     Heart sounds: Normal heart sounds.  Pulmonary:     Effort: Pulmonary effort is normal.     Breath sounds: Normal breath sounds.  Abdominal:     General: Abdomen is flat. Bowel sounds are normal.     Palpations: Abdomen is soft.     Tenderness: There is abdominal tenderness (LLQ).  Skin:    General: Skin is warm and dry.  Neurological:     General: No focal deficit present.     Mental Status: He is alert.  Psychiatric:        Mood and Affect: Mood normal.        Behavior: Behavior normal.      Assessment/Plan: Please see individual problem list.  Chronic constipation Assessment & Plan: Chronic constipation worsened after diverticulitis. Trulance  offers temporary relief. Consider mechanical obstruction or functional bowel disorder. Order KUB to assess stool burden. Continue Trulance  as needed and increase water  intake. Follow up with GI on August 4th. Consider repeat CT Abd/Pelvis.   Orders: -     DG Abd 1 View; Future  Change in stool caliber  Primary hypertension Assessment & Plan: Well-controlled on losartan  with recent readings of 117/60 and 118/70. Continue losartan  as prescribed.   Hyperlipidemia, unspecified hyperlipidemia type Assessment & Plan: Hyperlipidemia is managed with Atorvastatin  40 mg daily. Continue Atorvastatin .    Chronic obstructive pulmonary disease, unspecified COPD type (HCC) Assessment & Plan: COPD with occasional dyspnea, especially when supine. Hx OSA, managed with positional changes. Unable to  tolerate CPAP or dental device. Continue Advair  as prescribed. Follow up with pulmonology as needed.   Benign prostatic hyperplasia with urinary frequency Assessment & Plan: Enlarged prostate causing frequent urination, under urologist care. Follow up with urology today.   Unilateral inguinal hernia without obstruction or gangrene, recurrence not specified Assessment & Plan: Inguinal hernia with pain during urination and movement, but no significant symptoms or bulge. Surgery not recommended. Follow up with general surgery in two weeks.     Return in about 3 months (around 11/15/2023) for Follow up.   Leron Glance, NP-C [facility]

## 2023-08-16 ENCOUNTER — Ambulatory Visit: Payer: Self-pay | Admitting: Nurse Practitioner

## 2023-08-18 ENCOUNTER — Other Ambulatory Visit: Payer: Self-pay | Admitting: Nurse Practitioner

## 2023-08-18 ENCOUNTER — Ambulatory Visit: Admitting: Urology

## 2023-08-18 DIAGNOSIS — R1032 Left lower quadrant pain: Secondary | ICD-10-CM

## 2023-08-21 ENCOUNTER — Encounter: Payer: Self-pay | Admitting: Nurse Practitioner

## 2023-08-21 NOTE — Assessment & Plan Note (Signed)
 COPD with occasional dyspnea, especially when supine. Hx OSA, managed with positional changes. Unable to tolerate CPAP or dental device. Continue Advair  as prescribed. Follow up with pulmonology as needed.

## 2023-08-21 NOTE — Assessment & Plan Note (Signed)
 Inguinal hernia with pain during urination and movement, but no significant symptoms or bulge. Surgery not recommended. Follow up with general surgery in two weeks.

## 2023-08-21 NOTE — Assessment & Plan Note (Signed)
 Well-controlled on losartan  with recent readings of 117/60 and 118/70. Continue losartan  as prescribed.

## 2023-08-21 NOTE — Assessment & Plan Note (Signed)
 Enlarged prostate causing frequent urination, under urologist care. Follow up with urology today.

## 2023-08-21 NOTE — Assessment & Plan Note (Addendum)
 Chronic constipation worsened after diverticulitis. Trulance  offers temporary relief. Consider mechanical obstruction or functional bowel disorder. Order KUB to assess stool burden. Continue Trulance  as needed and increase water  intake. Follow up with GI on August 4th. Consider repeat CT Abd/Pelvis.

## 2023-08-21 NOTE — Assessment & Plan Note (Signed)
 Hyperlipidemia is managed with Atorvastatin  40 mg daily. Continue Atorvastatin .

## 2023-08-24 ENCOUNTER — Ambulatory Visit
Admission: RE | Admit: 2023-08-24 | Discharge: 2023-08-24 | Disposition: A | Discharge status: Home / Self Care | Source: Ambulatory Visit | Attending: Nurse Practitioner | Admitting: Nurse Practitioner

## 2023-08-24 DIAGNOSIS — R1032 Left lower quadrant pain: Secondary | ICD-10-CM | POA: Insufficient documentation

## 2023-08-24 DIAGNOSIS — K5732 Diverticulitis of large intestine without perforation or abscess without bleeding: Secondary | ICD-10-CM | POA: Diagnosis not present

## 2023-08-24 DIAGNOSIS — N400 Enlarged prostate without lower urinary tract symptoms: Secondary | ICD-10-CM | POA: Diagnosis not present

## 2023-08-24 MED ORDER — IOHEXOL 300 MG/ML  SOLN
100.0000 mL | Freq: Once | INTRAMUSCULAR | Status: AC | PRN
  Administered 2023-08-24: 100 mL via INTRAVENOUS

## 2023-08-24 MED ORDER — IOHEXOL 9 MG/ML PO SOLN
500.0000 mL | ORAL | Status: AC
  Administered 2023-08-24 (×2): 500 mL via ORAL

## 2023-08-28 ENCOUNTER — Ambulatory Visit: Payer: Self-pay | Admitting: Nurse Practitioner

## 2023-08-28 NOTE — H&P (View-Only) (Signed)
 Patient ID: [name]., male   DOB: [birth year], 68 y.o.   MRN: [id number]  Chief Complaint:  Left groin/LLQ pain.   History of Present Illness [name]. is a 68 y.o. male with a prior history of diverticulitis, now fully resolved, has a CT showing a fat filled left inguinal hernia as well.  No prior hx/ of bulge, doesn't appear to be exacerbated by cough or sneeze, and doesn't do much heavy lifting.  Denies any bowel issues, and had a prior RIH repaired sometime ago.  Currently content with continued observation.    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 CHADS2VASc => 0; b. on Eliquis  for pending ablation       Past Surgical History:  Procedure Laterality Date   ATRIAL FLUTTER ABLATION  02/17/2016   COLONOSCOPY  2014   COLONOSCOPY WITH PROPOFOL  N/A 06/30/2016   Procedure: COLONOSCOPY WITH PROPOFOL ;  Surgeon: Jinny Carmine, MD;  Location: [facility];  Service: Endoscopy;  Laterality: N/A;   COLONOSCOPY WITH PROPOFOL  N/A 07/07/2022   Procedure: COLONOSCOPY WITH PROPOFOLwith polyopectomy;  Surgeon: Jinny Carmine, MD;  Location: [facility];   Service: Endoscopy;  Laterality: N/A;   ELECTROPHYSIOLOGIC STUDY N/A 01/04/2016   Procedure: Cardioversion;  Surgeon: Deatrice DELENA Cage, MD;  Location: [facility] OR[facility];  Service: Cardiovascular;  Laterality: N/A;   ELECTROPHYSIOLOGIC STUDY N/A 02/17/2016   Procedure: A-Flutter Ablation;  Surgeon: Elspeth JAYSON Sage, MD;  Location: [facility] IN[facility] CV [facility];  Service: Cardiovascular;  Laterality: N/A;   ESOPHAGOGASTRODUODENOSCOPY (EGD) WITH PROPOFOL  N/A 07/07/2022   Procedure: ESOPHAGOGASTRODUODENOSCOPY (EGD) WITH PROPOFOL ;  Surgeon: Jinny Carmine, MD;  Location: [facility];  Service: Endoscopy;  Laterality: N/A;  balloon dilation 18mm   GANGLION CYST EXCISION Right 1994   wrist & back   HERNIA REPAIR Right 1994   abdominal repair with mesh   NASAL SEPTUM SURGERY  2011   Dr. Blair     Allergies  Allergen Reactions   Other Other (See Comments)    Pollen:  Sinus infection, cough, fatigue    Current Outpatient Medications  Medication Sig Dispense Refill   ADVAIR  HFA 115-21 MCG/ACT inhaler USE 2 INHALATIONS BY MOUTH TWICE DAILY 36 g 3   albuterol  (VENTOLIN  HFA) 108 (90 Base) MCG/ACT inhaler Inhale 2 puffs into the lungs every 4 (four) hours as needed for wheezing or shortness of breath. 8 g 2   atorvastatin  (LIPITOR) 40 MG tablet Take  1 tablet (40 mg total) by mouth every morning. 90 tablet 3   finasteride  (PROSCAR ) 5 MG tablet Take 1 tablet (5 mg total) by mouth daily. 90 tablet 0   losartan  (COZAAR ) 50 MG tablet Take 1 tablet (50 mg total) by mouth daily. 90 tablet 0   Multiple Vitamin (MULTIVITAMIN) tablet Take 1 tablet by mouth daily.     pantoprazole  (PROTONIX ) 40 MG tablet Take 1 tablet (40 mg total) by mouth daily. 90 tablet 3   Plecanatide  (TRULANCE ) 3 MG TABS Take 1 tablet (3 mg total) by mouth daily as needed (constipation). 90 tablet 1   senna (SENOKOT) 8.6 MG tablet Take 2 tablets by mouth daily with supper.     tadalafil  (CIALIS ) 5 MG tablet TAKE 1 TABLET BY MOUTH DAILY 90 tablet 3    traZODone  (DESYREL ) 50 MG tablet Take 0.5-1 tablets (25-50 mg total) by mouth at bedtime as needed for sleep. 30 tablet 3   Vibegron  (GEMTESA ) 75 MG TABS Take 1 tablet (75 mg total) by mouth daily.     VITAMIN D PO Take by mouth daily.     No current facility-administered medications for this visit.    Family History Family History  Problem Relation Age of Onset   Hyperlipidemia Mother    Cancer Mother        skin cancer?   Heart disease Mother 69       MI - died in her sleep   Hyperlipidemia Father    Heart disease Father    Kidney disease Neg Hx    Prostate cancer Neg Hx    Kidney cancer Neg Hx    Bladder Cancer Neg Hx       Social History Social History   Tobacco Use   Smoking status: Former    Current packs/day: 0.00    Average packs/day: 1 pack/day for 20.0 years (20.0 ttl pk-yrs)    Types: Cigarettes    Start date: 02/08/1971    Quit date: 02/08/1991    Years since quitting: 32.5    Passive exposure: Past   Smokeless tobacco: Never  Vaping Use   Vaping status: Never Used  Substance Use Topics   Alcohol use: No    Alcohol/week: 0.0 standard drinks of alcohol   Drug use: No        Review of Systems  All other systems reviewed and are negative.    Physical Exam Blood pressure 107/66, pulse (!) 53, height 5' 10 (1.778 m), weight 200 lb (90.7 kg), SpO2 98%. Last Weight  Most recent update: 08/29/2023  8:32 AM    Weight  90.7 kg (200 lb)              CONSTITUTIONAL: Well developed, and nourished, appropriately responsive and aware without distress.   EYES: Sclera non-icteric.   EARS, NOSE, MOUTH AND THROAT:  The oropharynx is clear. Oral mucosa is pink and moist.    Hearing is intact to voice.  NECK: Trachea is midline, and there is no jugular venous distension.  LYMPH NODES:  Lymph nodes in the neck are not appreciated. RESPIRATORY:  Normal respiratory effort without pathologic use of accessory muscles. CARDIOVASCULAR: Well perfused.  GI: The  abdomen is  soft, nontender, and nondistended. GU: Testes descended bilaterally, no appreciable hernia on the right side, there is a mobile lipomatous presents with Valsalva on the left.  No appreciable feel for hernia sac, no appreciable bulge. MUSCULOSKELETAL:  Symmetrical muscle tone appreciated in all four extremities.  SKIN: Skin turgor is normal. No pathologic skin lesions appreciated.  NEUROLOGIC:  Motor and sensation appear grossly normal.  Cranial nerves are grossly without defect. PSYCH:  Alert and oriented to person, place and time. Affect is appropriate for situation.  Data Reviewed I have personally reviewed what is currently available of the patient's imaging, recent labs and medical records.   Labs:     Latest Ref Rng & Units 07/11/2023    1:46 PM 02/21/2023    9:29 AM 08/20/2020    1:17 AM  CBC  WBC 4.0 - 10.5 K/uL 9.4  5.6  6.4   Hemoglobin 13.0 - 17.0 g/dL 84.8  84.7  86.3   Hematocrit 39.0 - 52.0 % 44.1  45.5  39.1   Platelets 150.0 - 400.0 K/uL 203.0  260.0  169       Latest Ref Rng & Units 07/13/2023    7:42 AM 07/11/2023    1:46 PM 02/21/2023    9:29 AM  CMP  Glucose 70 - 99 mg/dL  78  71   BUN 6 - 23 mg/dL  19  17   Creatinine 9.59 - 1.50 mg/dL  9.10  9.05   Sodium 864 - 145 mEq/L  140  144   Potassium 3.5 - 5.1 mEq/L  4.2  4.5   Chloride 96 - 112 mEq/L  103  105   CO2 19 - 32 mEq/L  30  30   Calcium  8.4 - 10.5 mg/dL  9.8  9.9   Total Protein 6.0 - 8.3 g/dL  6.7  6.5   Total Bilirubin 0.2 - 1.2 mg/dL 1.9  1.9  1.5   Alkaline Phos 39 - 117 U/L  67  74   AST 0 - 37 U/L  17  19   ALT 0 - 53 U/L  15  18       Imaging: Radiological images personally reviewed:  CLINICAL DATA:  LLQ abdominal pain concern for diverticulitis   EXAM: CT ABDOMEN AND PELVIS WITH CONTRAST   TECHNIQUE: Multidetector CT imaging of the abdomen and pelvis was performed using the standard protocol following bolus administration of intravenous contrast.   RADIATION DOSE REDUCTION:  This exam was performed according to the departmental dose-optimization program which includes automated exposure control, adjustment of the mA and/or kV according to patient size and/or use of iterative reconstruction technique.   CONTRAST:  80mL OMNIPAQUE  IOHEXOL  350 MG/ML SOLN   COMPARISON:  CT 05/10/2016   FINDINGS: Lower chest: Clear lung bases.   Hepatobiliary: Mild diffuse hepatic steatosis. No suspicious liver lesion. Decompressed gallbladder. No biliary dilatation.   Pancreas: Mild fatty atrophy.  No ductal dilatation or inflammation.   Spleen: Normal in size without focal abnormality.   Adrenals/Urinary Tract: Normal adrenal glands. No hydronephrosis or renal calculi. Right renal simple cyst. Left renal hypodensities are too small to characterize. No further follow-up imaging is recommended. Nondistended urinary bladder.   Stomach/Bowel: Inflamed diverticulum in the proximal sigmoid colon, series 5, image 36, with mild associated colonic wall thickening and pericolonic edema consistent diverticulitis. No perforation or abscess. Moderate volume of stool throughout the colon. Normal appendix no small bowel obstruction or inflammation. Stomach is nondistended.   Vascular/Lymphatic: Aortic atherosclerosis. No aortic aneurysm. Patent portal and mesenteric veins. No enlarged lymph nodes in the abdomen or pelvis.   Reproductive: Prominent prostate spans 4.5 cm transverse.   Other: Fat stranding and inflammation in the left lower quadrant. Trace free fluid but no focal fluid collection. No  free air. Small fat containing left inguinal hernia. Fatty defect of the right lateral abdominal wall musculature may represent a lipoma. Previous right-sided abdominal wall hernia repair.   Musculoskeletal: There are no acute or suspicious osseous abnormalities.   IMPRESSION: 1. Acute uncomplicated sigmoid diverticulitis. 2. Mild hepatic steatosis.   Aortic Atherosclerosis  (ICD10-I70.0).     Electronically Signed   By: Andrea Gasman M.D.   On: 07/11/2023 16:46  CLINICAL DATA:  Left lower quadrant pain.  Follow-up diverticulitis.   EXAM: CT ABDOMEN AND PELVIS WITH CONTRAST   TECHNIQUE: Multidetector CT imaging of the abdomen and pelvis was performed using the standard protocol following bolus administration of intravenous contrast.   RADIATION DOSE REDUCTION: This exam was performed according to the departmental dose-optimization program which includes automated exposure control, adjustment of the mA and/or kV according to patient size and/or use of iterative reconstruction technique.   CONTRAST:  OMNIPAQUE  IOHEXOL  300 MG/ML  SOLN   COMPARISON:  07/11/2023   FINDINGS: Lower Chest: No acute findings.   Hepatobiliary: No suspicious hepatic masses identified. Gallbladder is unremarkable. No evidence of biliary ductal dilatation.   Pancreas:  No mass or inflammatory changes.   Spleen: Within normal limits in size and appearance.   Adrenals/Urinary Tract: No suspicious masses identified. No evidence of ureteral calculi or hydronephrosis.   Stomach/Bowel: Previously seen sigmoid diverticulitis has resolved since previous study. No evidence of abscess or other complication. No evidence of obstruction, other inflammatory process or abnormal fluid collections.   Vascular/Lymphatic: No pathologically enlarged lymph nodes. No acute vascular findings.   Reproductive:  Mildly enlarged prostate.   Other:  None.   Musculoskeletal:  No suspicious bone lesions identified.   IMPRESSION: Interval resolution of sigmoid diverticulitis since previous study. No evidence of abscess or other complication.   Mildly enlarged prostate.     Electronically Signed   By: Tyler DELENA Kil M.D.   On: 08/28/2023 12:34  Within last 24 hrs: No results found.  Assessment    Left inguinal hernia, fat filled, and associated with some left lower quadrant  pain/discomfort. Patient Active Problem List   Diagnosis Date Noted   Diverticulitis of colon without hemorrhage 08/29/2023   Unilateral inguinal hernia without obstruction or gangrene 07/27/2023   Fatty liver 07/27/2023   LLQ pain 07/11/2023   PND (post-nasal drip) 02/21/2023   Insomnia 10/31/2022   History of colonic polyps 07/07/2022   Vision changes 02/18/2022   Rosacea 11/12/2021   COPD (chronic obstructive pulmonary disease) (HCC) 09/15/2021   Aortic atherosclerosis (HCC) 09/15/2021   T9 vertebral fracture, sequela 03/25/2021   Levoscoliosis of thoracic spine 02/25/2021   Chronic midline thoracic back pain 02/25/2021   Claustrophobia 02/04/2021   Thoracic compression fracture, sequela (T1 & T2) 02/04/2021   Cervicalgia 01/19/2021   Spondylosis without myelopathy or radiculopathy, cervical region 01/19/2021   Long term prescription benzodiazepine use 01/04/2021   Chronic neck pain (1ry area of Pain) (Bilateral) (L>R) 01/04/2021   Cervicogenic headache (3ry area of Pain) (Bilateral) 01/04/2021   Lumbosacral radiculopathy at S1 (Left) 01/04/2021   Chronic upper back pain (6th area of Pain) 01/04/2021   Cervical facet syndrome (Bilateral) 01/04/2021   Abnormal MRI, lumbar spine (11/18/2017) 01/04/2021   DDD (degenerative disc disease), lumbar 01/04/2021   Lumbar facet arthropathy (Multilevel) (Bilateral) 01/04/2021   Lumbar facet syndrome 01/04/2021   Abnormal MRI, cervical spine (02/17/2021) 01/04/2021   DDD (degenerative disc disease), cervical 01/04/2021   Cervical facet hypertrophy 01/04/2021   Grade 1 Cervical  Retrolisthesis of C4/C5 and C5/C6 01/04/2021   Chronic pain syndrome 01/01/2021   Pharmacologic therapy 01/01/2021   Disorder of skeletal system 01/01/2021   Problems influencing health status 01/01/2021   Skin cyst 06/03/2020   Epididymal cyst 06/03/2020   History of Helicobacter pylori infection 05/17/2019   GERD (gastroesophageal reflux disease) 01/16/2018    Sleep apnea 01/16/2018   Chronic upper extremity pain (2ry area of Pain) (Bilateral) (L>R) 12/13/2017   Chronic lower extremity pain (5th area of Pain) (Bilateral) (L>R) 12/13/2017   Chronic cervical radiculopathy (C7, C8) (Left) 01/23/2017   Hypertension 01/23/2017   Chronic constipation 05/19/2016   Chronic low back pain with left-sided sciatica 03/30/2016   History of atrial flutter    Elevated PSA 12/24/2015   Dyspnea    Adjustment disorder with mixed anxiety and depressed mood 07/23/2015   OAB (overactive bladder) 03/31/2015   Viral upper respiratory illness 11/04/2014   BPH (benign prostatic hyperplasia) 11/19/2013   Hyperlipidemia 11/19/2013   Cervical foraminal stenosis (C4-5, C5-6) (Left) 03/08/2013   Lipoma of back 03/08/2013   Benign prostatic hyperplasia with urinary obstruction 08/23/2012   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/2012   Colon polyp 04/18/2011    Plan    Due to his recent change in urinary frequency, and the history of diverticulitis, it is hard to be entirely certain that repair of this fat filled left inguinal canal will resolve any of the pain issues as his symptoms are not quite definitive.  Offered him to proceed with repair, I believe at this time he would like to continue to observe and defer left inguinal hernia repair.  I told him we will gladly follow him up as he desires.  I also reviewed issues or concerns that may arise and additional symptoms that may indicate need to obtain further evaluation.   Face-to-face time spent with the patient and accompanying care providers(if present) was 20 minutes, spent counseling, educating, and coordinating care of the patient.    These notes generated with voice recognition software. I apologize for typographical errors.  [name] M.D., FACS 08/29/2023, 8:59 AM

## 2023-08-28 NOTE — Progress Notes (Signed)
 Patient ID: [name]., male   DOB: [birth year], 68 y.o.   MRN: [id number]  Chief Complaint:  Left groin/LLQ pain.   History of Present Illness [name]. is a 68 y.o. male with a prior history of diverticulitis, now fully resolved, has a CT showing a fat filled left inguinal hernia as well.  No prior hx/ of bulge, doesn't appear to be exacerbated by cough or sneeze, and doesn't do much heavy lifting.  Denies any bowel issues, and had a prior RIH repaired sometime ago.  Currently content with continued observation.    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 CHADS2VASc => 0; b. on Eliquis  for pending ablation       Past Surgical History:  Procedure Laterality Date   ATRIAL FLUTTER ABLATION  02/17/2016   COLONOSCOPY  2014   COLONOSCOPY WITH PROPOFOL  N/A 06/30/2016   Procedure: COLONOSCOPY WITH PROPOFOL ;  Surgeon: Jinny Carmine, MD;  Location: [facility];  Service: Endoscopy;  Laterality: N/A;   COLONOSCOPY WITH PROPOFOL  N/A 07/07/2022   Procedure: COLONOSCOPY WITH PROPOFOLwith polyopectomy;  Surgeon: Jinny Carmine, MD;  Location: [facility];   Service: Endoscopy;  Laterality: N/A;   ELECTROPHYSIOLOGIC STUDY N/A 01/04/2016   Procedure: Cardioversion;  Surgeon: Deatrice DELENA Cage, MD;  Location: [facility] OR[facility];  Service: Cardiovascular;  Laterality: N/A;   ELECTROPHYSIOLOGIC STUDY N/A 02/17/2016   Procedure: A-Flutter Ablation;  Surgeon: Elspeth JAYSON Sage, MD;  Location: [facility] IN[facility]ASIVE CV [facility];  Service: Cardiovascular;  Laterality: N/A;   ESOPHAGOGASTRODUODENOSCOPY (EGD) WITH PROPOFOL  N/A 07/07/2022   Procedure: ESOPHAGOGASTRODUODENOSCOPY (EGD) WITH PROPOFOL ;  Surgeon: Jinny Carmine, MD;  Location: [facility];  Service: Endoscopy;  Laterality: N/A;  balloon dilation 18mm   GANGLION CYST EXCISION Right 1994   wrist & back   HERNIA REPAIR Right 1994   abdominal repair with mesh   NASAL SEPTUM SURGERY  2011   Dr. Blair     Allergies  Allergen Reactions   Other Other (See Comments)    Pollen:  Sinus infection, cough, fatigue    Current Outpatient Medications  Medication Sig Dispense Refill   ADVAIR  HFA 115-21 MCG/ACT inhaler USE 2 INHALATIONS BY MOUTH TWICE DAILY 36 g 3   albuterol  (VENTOLIN  HFA) 108 (90 Base) MCG/ACT inhaler Inhale 2 puffs into the lungs every 4 (four) hours as needed for wheezing or shortness of breath. 8 g 2   atorvastatin  (LIPITOR) 40 MG tablet Take  1 tablet (40 mg total) by mouth every morning. 90 tablet 3   finasteride  (PROSCAR ) 5 MG tablet Take 1 tablet (5 mg total) by mouth daily. 90 tablet 0   losartan  (COZAAR ) 50 MG tablet Take 1 tablet (50 mg total) by mouth daily. 90 tablet 0   Multiple Vitamin (MULTIVITAMIN) tablet Take 1 tablet by mouth daily.     pantoprazole  (PROTONIX ) 40 MG tablet Take 1 tablet (40 mg total) by mouth daily. 90 tablet 3   Plecanatide  (TRULANCE ) 3 MG TABS Take 1 tablet (3 mg total) by mouth daily as needed (constipation). 90 tablet 1   senna (SENOKOT) 8.6 MG tablet Take 2 tablets by mouth daily with supper.     tadalafil  (CIALIS ) 5 MG tablet TAKE 1 TABLET BY MOUTH DAILY 90 tablet 3    traZODone  (DESYREL ) 50 MG tablet Take 0.5-1 tablets (25-50 mg total) by mouth at bedtime as needed for sleep. 30 tablet 3   Vibegron  (GEMTESA ) 75 MG TABS Take 1 tablet (75 mg total) by mouth daily.     VITAMIN D PO Take by mouth daily.     No current facility-administered medications for this visit.    Family History Family History  Problem Relation Age of Onset   Hyperlipidemia Mother    Cancer Mother        skin cancer?   Heart disease Mother 69       MI - died in her sleep   Hyperlipidemia Father    Heart disease Father    Kidney disease Neg Hx    Prostate cancer Neg Hx    Kidney cancer Neg Hx    Bladder Cancer Neg Hx       Social History Social History   Tobacco Use   Smoking status: Former    Current packs/day: 0.00    Average packs/day: 1 pack/day for 20.0 years (20.0 ttl pk-yrs)    Types: Cigarettes    Start date: 02/08/1971    Quit date: 02/08/1991    Years since quitting: 32.5    Passive exposure: Past   Smokeless tobacco: Never  Vaping Use   Vaping status: Never Used  Substance Use Topics   Alcohol use: No    Alcohol/week: 0.0 standard drinks of alcohol   Drug use: No        Review of Systems  All other systems reviewed and are negative.    Physical Exam Blood pressure 107/66, pulse (!) 53, height 5' 10 (1.778 m), weight 200 lb (90.7 kg), SpO2 98%. Last Weight  Most recent update: 08/29/2023  8:32 AM    Weight  90.7 kg (200 lb)              CONSTITUTIONAL: Well developed, and nourished, appropriately responsive and aware without distress.   EYES: Sclera non-icteric.   EARS, NOSE, MOUTH AND THROAT:  The oropharynx is clear. Oral mucosa is pink and moist.    Hearing is intact to voice.  NECK: Trachea is midline, and there is no jugular venous distension.  LYMPH NODES:  Lymph nodes in the neck are not appreciated. RESPIRATORY:  Normal respiratory effort without pathologic use of accessory muscles. CARDIOVASCULAR: Well perfused.  GI: The  abdomen is  soft, nontender, and nondistended. GU: Testes descended bilaterally, no appreciable hernia on the right side, there is a mobile lipomatous presents with Valsalva on the left.  No appreciable feel for hernia sac, no appreciable bulge. MUSCULOSKELETAL:  Symmetrical muscle tone appreciated in all four extremities.  SKIN: Skin turgor is normal. No pathologic skin lesions appreciated.  NEUROLOGIC:  Motor and sensation appear grossly normal.  Cranial nerves are grossly without defect. PSYCH:  Alert and oriented to person, place and time. Affect is appropriate for situation.  Data Reviewed I have personally reviewed what is currently available of the patient's imaging, recent labs and medical records.   Labs:     Latest Ref Rng & Units 07/11/2023    1:46 PM 02/21/2023    9:29 AM 08/20/2020    1:17 AM  CBC  WBC 4.0 - 10.5 K/uL 9.4  5.6  6.4   Hemoglobin 13.0 - 17.0 g/dL 84.8  84.7  86.3   Hematocrit 39.0 - 52.0 % 44.1  45.5  39.1   Platelets 150.0 - 400.0 K/uL 203.0  260.0  169       Latest Ref Rng & Units 07/13/2023    7:42 AM 07/11/2023    1:46 PM 02/21/2023    9:29 AM  CMP  Glucose 70 - 99 mg/dL  78  71   BUN 6 - 23 mg/dL  19  17   Creatinine 9.59 - 1.50 mg/dL  9.10  9.05   Sodium 864 - 145 mEq/L  140  144   Potassium 3.5 - 5.1 mEq/L  4.2  4.5   Chloride 96 - 112 mEq/L  103  105   CO2 19 - 32 mEq/L  30  30   Calcium  8.4 - 10.5 mg/dL  9.8  9.9   Total Protein 6.0 - 8.3 g/dL  6.7  6.5   Total Bilirubin 0.2 - 1.2 mg/dL 1.9  1.9  1.5   Alkaline Phos 39 - 117 U/L  67  74   AST 0 - 37 U/L  17  19   ALT 0 - 53 U/L  15  18       Imaging: Radiological images personally reviewed:  CLINICAL DATA:  LLQ abdominal pain concern for diverticulitis   EXAM: CT ABDOMEN AND PELVIS WITH CONTRAST   TECHNIQUE: Multidetector CT imaging of the abdomen and pelvis was performed using the standard protocol following bolus administration of intravenous contrast.   RADIATION DOSE REDUCTION:  This exam was performed according to the departmental dose-optimization program which includes automated exposure control, adjustment of the mA and/or kV according to patient size and/or use of iterative reconstruction technique.   CONTRAST:  80mL OMNIPAQUE  IOHEXOL  350 MG/ML SOLN   COMPARISON:  CT 05/10/2016   FINDINGS: Lower chest: Clear lung bases.   Hepatobiliary: Mild diffuse hepatic steatosis. No suspicious liver lesion. Decompressed gallbladder. No biliary dilatation.   Pancreas: Mild fatty atrophy.  No ductal dilatation or inflammation.   Spleen: Normal in size without focal abnormality.   Adrenals/Urinary Tract: Normal adrenal glands. No hydronephrosis or renal calculi. Right renal simple cyst. Left renal hypodensities are too small to characterize. No further follow-up imaging is recommended. Nondistended urinary bladder.   Stomach/Bowel: Inflamed diverticulum in the proximal sigmoid colon, series 5, image 36, with mild associated colonic wall thickening and pericolonic edema consistent diverticulitis. No perforation or abscess. Moderate volume of stool throughout the colon. Normal appendix no small bowel obstruction or inflammation. Stomach is nondistended.   Vascular/Lymphatic: Aortic atherosclerosis. No aortic aneurysm. Patent portal and mesenteric veins. No enlarged lymph nodes in the abdomen or pelvis.   Reproductive: Prominent prostate spans 4.5 cm transverse.   Other: Fat stranding and inflammation in the left lower quadrant. Trace free fluid but no focal fluid collection. No  free air. Small fat containing left inguinal hernia. Fatty defect of the right lateral abdominal wall musculature may represent a lipoma. Previous right-sided abdominal wall hernia repair.   Musculoskeletal: There are no acute or suspicious osseous abnormalities.   IMPRESSION: 1. Acute uncomplicated sigmoid diverticulitis. 2. Mild hepatic steatosis.   Aortic Atherosclerosis  (ICD10-I70.0).     Electronically Signed   By: Andrea Gasman M.D.   On: 07/11/2023 16:46  CLINICAL DATA:  Left lower quadrant pain.  Follow-up diverticulitis.   EXAM: CT ABDOMEN AND PELVIS WITH CONTRAST   TECHNIQUE: Multidetector CT imaging of the abdomen and pelvis was performed using the standard protocol following bolus administration of intravenous contrast.   RADIATION DOSE REDUCTION: This exam was performed according to the departmental dose-optimization program which includes automated exposure control, adjustment of the mA and/or kV according to patient size and/or use of iterative reconstruction technique.   CONTRAST:  OMNIPAQUE  IOHEXOL  300 MG/ML  SOLN   COMPARISON:  07/11/2023   FINDINGS: Lower Chest: No acute findings.   Hepatobiliary: No suspicious hepatic masses identified. Gallbladder is unremarkable. No evidence of biliary ductal dilatation.   Pancreas:  No mass or inflammatory changes.   Spleen: Within normal limits in size and appearance.   Adrenals/Urinary Tract: No suspicious masses identified. No evidence of ureteral calculi or hydronephrosis.   Stomach/Bowel: Previously seen sigmoid diverticulitis has resolved since previous study. No evidence of abscess or other complication. No evidence of obstruction, other inflammatory process or abnormal fluid collections.   Vascular/Lymphatic: No pathologically enlarged lymph nodes. No acute vascular findings.   Reproductive:  Mildly enlarged prostate.   Other:  None.   Musculoskeletal:  No suspicious bone lesions identified.   IMPRESSION: Interval resolution of sigmoid diverticulitis since previous study. No evidence of abscess or other complication.   Mildly enlarged prostate.     Electronically Signed   By: Tyler DELENA Kil M.D.   On: 08/28/2023 12:34  Within last 24 hrs: No results found.  Assessment    Left inguinal hernia, fat filled, and associated with some left lower quadrant  pain/discomfort. Patient Active Problem List   Diagnosis Date Noted   Diverticulitis of colon without hemorrhage 08/29/2023   Unilateral inguinal hernia without obstruction or gangrene 07/27/2023   Fatty liver 07/27/2023   LLQ pain 07/11/2023   PND (post-nasal drip) 02/21/2023   Insomnia 10/31/2022   History of colonic polyps 07/07/2022   Vision changes 02/18/2022   Rosacea 11/12/2021   COPD (chronic obstructive pulmonary disease) (HCC) 09/15/2021   Aortic atherosclerosis (HCC) 09/15/2021   T9 vertebral fracture, sequela 03/25/2021   Levoscoliosis of thoracic spine 02/25/2021   Chronic midline thoracic back pain 02/25/2021   Claustrophobia 02/04/2021   Thoracic compression fracture, sequela (T1 & T2) 02/04/2021   Cervicalgia 01/19/2021   Spondylosis without myelopathy or radiculopathy, cervical region 01/19/2021   Long term prescription benzodiazepine use 01/04/2021   Chronic neck pain (1ry area of Pain) (Bilateral) (L>R) 01/04/2021   Cervicogenic headache (3ry area of Pain) (Bilateral) 01/04/2021   Lumbosacral radiculopathy at S1 (Left) 01/04/2021   Chronic upper back pain (6th area of Pain) 01/04/2021   Cervical facet syndrome (Bilateral) 01/04/2021   Abnormal MRI, lumbar spine (11/18/2017) 01/04/2021   DDD (degenerative disc disease), lumbar 01/04/2021   Lumbar facet arthropathy (Multilevel) (Bilateral) 01/04/2021   Lumbar facet syndrome 01/04/2021   Abnormal MRI, cervical spine (02/17/2021) 01/04/2021   DDD (degenerative disc disease), cervical 01/04/2021   Cervical facet hypertrophy 01/04/2021   Grade 1 Cervical  Retrolisthesis of C4/C5 and C5/C6 01/04/2021   Chronic pain syndrome 01/01/2021   Pharmacologic therapy 01/01/2021   Disorder of skeletal system 01/01/2021   Problems influencing health status 01/01/2021   Skin cyst 06/03/2020   Epididymal cyst 06/03/2020   History of Helicobacter pylori infection 05/17/2019   GERD (gastroesophageal reflux disease) 01/16/2018    Sleep apnea 01/16/2018   Chronic upper extremity pain (2ry area of Pain) (Bilateral) (L>R) 12/13/2017   Chronic lower extremity pain (5th area of Pain) (Bilateral) (L>R) 12/13/2017   Chronic cervical radiculopathy (C7, C8) (Left) 01/23/2017   Hypertension 01/23/2017   Chronic constipation 05/19/2016   Chronic low back pain with left-sided sciatica 03/30/2016   History of atrial flutter    Elevated PSA 12/24/2015   Dyspnea    Adjustment disorder with mixed anxiety and depressed mood 07/23/2015   OAB (overactive bladder) 03/31/2015   Viral upper respiratory illness 11/04/2014   BPH (benign prostatic hyperplasia) 11/19/2013   Hyperlipidemia 11/19/2013   Cervical foraminal stenosis (C4-5, C5-6) (Left) 03/08/2013   Lipoma of back 03/08/2013   Benign prostatic hyperplasia with urinary obstruction 08/23/2012   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/2012   Colon polyp 04/18/2011    Plan    Due to his recent change in urinary frequency, and the history of diverticulitis, it is hard to be entirely certain that repair of this fat filled left inguinal canal will resolve any of the pain issues as his symptoms are not quite definitive.  Offered him to proceed with repair, I believe at this time he would like to continue to observe and defer left inguinal hernia repair.  I told him we will gladly follow him up as he desires.  I also reviewed issues or concerns that may arise and additional symptoms that may indicate need to obtain further evaluation.   Face-to-face time spent with the patient and accompanying care providers(if present) was 20 minutes, spent counseling, educating, and coordinating care of the patient.    These notes generated with voice recognition software. I apologize for typographical errors.  [name] M.D., FACS 08/29/2023, 8:59 AM

## 2023-08-29 ENCOUNTER — Ambulatory Visit (INDEPENDENT_AMBULATORY_CARE_PROVIDER_SITE_OTHER): Admitting: Surgery

## 2023-08-29 ENCOUNTER — Encounter: Payer: Self-pay | Admitting: Surgery

## 2023-08-29 VITALS — BP 107/66 | HR 53 | Ht 70.0 in | Wt 200.0 lb

## 2023-08-29 DIAGNOSIS — K4090 Unilateral inguinal hernia, without obstruction or gangrene, not specified as recurrent: Secondary | ICD-10-CM

## 2023-08-29 DIAGNOSIS — K5732 Diverticulitis of large intestine without perforation or abscess without bleeding: Secondary | ICD-10-CM | POA: Insufficient documentation

## 2023-08-29 NOTE — Patient Instructions (Signed)
 Follow-up with our office as needed.  Please call and ask to speak with a nurse if you develop questions or concerns.   Groin Hernia (Inguinal Hernia) in Adults: What to Know  A hernia happens when an organ or tissue in your body pushes out through a weak spot in the muscles of your belly. This makes a bulge. A groin hernia is also called an inguinal hernia. It's found in your groin, which is the area where your leg meets your lower belly. This kind of hernia could also be: In your scrotum, if you're male. In the folds of skin around your vagina, if you're male. You may be able to push the bulge back into your belly. If you can't push it in and blood flow is cut off to the hernia, you'll need surgery right away. What are the causes? A groin hernia may happen when you strain your belly muscles, such as when you: Lift a heavy object. Strain to poop. Cough. What increases the risk? You may be more likely to get a groin hernia if: You're male. You're 50 years or older. You're pregnant. You've had a groin hernia or belly surgery before. You smoke. You're overweight. You work at a job where you need to stand a lot or lift heavy things. What are the signs or symptoms? Symptoms may depend on how big the hernia is. If it's small, you may not have symptoms. If it's bigger, you may have: A bulge near your groin or genitals. Pain or burning in your groin. A dull ache or feeling of pressure in your groin. If blood flow is cut off to the tissues inside the hernia, you may also: Feel pain and tenderness when you touch the bulge. The skin over it may turn red or purple. Have a fever. Throw up or feel like you may throw up. Have trouble pooping or passing gas. How is this treated? Treatment depends on how big the hernia is and what symptoms you have. You may need: To be watched to see if the bulge grows bigger. Surgery. This may be done if the hernia is big or if you have symptoms. Follow  these instructions at home: Lifestyle Ask if it's OK for you to lift. Try not to stand for long periods of time. Do not smoke, vape, or use nicotine or tobacco. Stay at a healthy weight. Try not to do things that put pressure on your hernia. Preventing trouble pooping You may need to take these steps to help prevent or treat trouble pooping (constipation): Take medicines to help you poop. Eat foods high in fiber, like beans, whole grains, and fresh fruits and vegetables. Drink more fluids as told. General instructions Try to push the hernia back in place by very gently pressing on it while lying down. Do not try to force it back in if it won't push in easily. Watch your hernia for any changes in: Shape. Size. Color. Take your medicines only as told. Contact a doctor if: You have a fever. You have new symptoms. Your symptoms get worse. You can't poop or pass gas. Get help right away if: Your bulge: Starts to hurt a lot. Changes color. You have sudden pain in your scrotum, or your scrotum changes size. You can't gently push the hernia back in place. You feel like you may vomit, and that feeling does not go away. You keep throwing up or feeling like you need to throw up. These symptoms may be an emergency. Call  911 right away. Do not wait to see if the symptoms will go away. Do not drive yourself to the hospital. This information is not intended to replace advice given to you by your health care provider. Make sure you discuss any questions you have with your health care provider. Document Revised: 09/22/2022 Document Reviewed: 09/22/2022 Elsevier Patient Education  2024 [employer].

## 2023-08-31 ENCOUNTER — Ambulatory Visit: Payer: Self-pay | Admitting: Surgery

## 2023-08-31 ENCOUNTER — Ambulatory Visit: Admitting: Nurse Practitioner

## 2023-08-31 DIAGNOSIS — K4090 Unilateral inguinal hernia, without obstruction or gangrene, not specified as recurrent: Secondary | ICD-10-CM

## 2023-09-01 ENCOUNTER — Telehealth: Payer: Self-pay | Admitting: Surgery

## 2023-09-01 NOTE — Telephone Encounter (Signed)
 Patient has been advised of Pre-Admission date/time, and Surgery date at [facility].  Surgery Date: 09/15/23 Preadmission Testing Date: 09/08/23 (phone 8a-1p)  Patient informed of the scheduling process and surgery information given at time of office visit.  Patient has been made aware to call [phone], between 1-3:00pm the day before surgery, to find out what time to arrive for surgery.

## 2023-09-08 ENCOUNTER — Other Ambulatory Visit: Payer: Self-pay

## 2023-09-08 ENCOUNTER — Encounter
Admission: RE | Admit: 2023-09-08 | Discharge: 2023-09-08 | Disposition: A | Discharge status: Home / Self Care | Source: Ambulatory Visit | Attending: Surgery | Admitting: Surgery

## 2023-09-08 DIAGNOSIS — I10 Essential (primary) hypertension: Secondary | ICD-10-CM | POA: Insufficient documentation

## 2023-09-08 DIAGNOSIS — Z01810 Encounter for preprocedural cardiovascular examination: Secondary | ICD-10-CM | POA: Diagnosis not present

## 2023-09-08 HISTORY — DX: Dyspnea, unspecified: R06.00

## 2023-09-08 HISTORY — DX: Essential (primary) hypertension: I10

## 2023-09-08 HISTORY — DX: Unilateral inguinal hernia, without obstruction or gangrene, not specified as recurrent: K40.90

## 2023-09-08 NOTE — Patient Instructions (Addendum)
 Your procedure is scheduled on: 09/15/23 - Friday Report to the Registration Desk on the 1st floor of the [facility]. To find out your arrival time, please call [phone] between 1PM - 3PM on: 09/14/23 - Thursday If your arrival time is 6:00 am, do not arrive before that time as the [facility] entrance doors do not open until 6:00 am.  REMEMBER: Instructions that are not followed completely may result in serious medical risk, up to and including death; or upon the discretion of your surgeon and anesthesiologist your surgery may need to be rescheduled.  Do not eat food or drink any liquids after midnight the night before surgery.  No gum chewing or hard candies.  One week prior to surgery: Stop Anti-inflammatories (NSAIDS) such as Advil, Aleve , Ibuprofen, Motrin, Naproxen , Naprosyn  and Aspirin based products such as Excedrin, Goody's Powder, BC Powder. You may take Tylenol  if needed for pain up until the day of surgery.  Stop ANY OVER THE COUNTER supplements until after surgery : Multivitamin  HOLD tadalafil  (CIALIS ) 2 days prior to your surgery, may resume after surgery.  ON THE DAY OF SURGERY ONLY TAKE THESE MEDICATIONS WITH SIPS OF WATER :  ADVAIR   atorvastatin  (LIPITOR)  finasteride  (PROSCAR )  pantoprazole  (PROTONIX )    Use inhalers on the day of surgery and bring to the hospital.  No Alcohol for 24 hours before or after surgery.  No Smoking including e-cigarettes for 24 hours before surgery.  No chewable tobacco products for at least 6 hours before surgery.  No nicotine patches on the day of surgery.  Do not use any recreational drugs for at least a week (preferably 2 weeks) before your surgery.  Please be advised that the combination of cocaine and anesthesia may have negative outcomes, up to and including death. If you test positive for cocaine, your surgery will be cancelled.  On the morning of surgery brush your teeth with toothpaste and water , you may rinse  your mouth with mouthwash if you wish. Do not swallow any toothpaste or mouthwash.  Use CHG Soap or wipes as directed on instruction sheet.  Do not wear jewelry, make-up, hairpins, clips or nail polish.  For welded (permanent) jewelry: bracelets, anklets, waist bands, etc.  Please have this removed prior to surgery.  If it is not removed, there is a chance that hospital personnel will need to cut it off on the day of surgery.  Do not wear lotions, powders, or perfumes.   Do not shave body hair from the neck down 48 hours before surgery.  Contact lenses, hearing aids and dentures may not be worn into surgery.  Do not bring valuables to the hospital. [facility] is not responsible for any missing/lost belongings or valuables.   Notify your doctor if there is any change in your medical condition (cold, fever, infection).  Wear comfortable clothing (specific to your surgery type) to the hospital.  After surgery, you can help prevent lung complications by doing breathing exercises.  Take deep breaths and cough every 1-2 hours. Your doctor may order a device called an Incentive Spirometer to help you take deep breaths.  When coughing or sneezing, hold a pillow firmly against your incision with both hands. This is called "splinting." Doing this helps protect your incision. It also decreases belly discomfort.  If you are being admitted to the hospital overnight, leave your suitcase in the car. After surgery it may be brought to your room.  In case of increased patient census, it may  be necessary for you, the patient, to continue your postoperative care in the Same Day Surgery department.  If you are being discharged the day of surgery, you will not be allowed to drive home. You will need a responsible individual to drive you home and stay with you for 24 hours after surgery.   If you are taking public transportation, you will need to have a responsible individual with you.  Please call the  Pre-admissions Testing Dept. at [phone] if you have any questions about these instructions.  Surgery Visitation Policy:  Patients having surgery or a procedure may have two visitors.  Children under the age of 16 must have an adult with them who is not the patient.  Inpatient Visitation:    Visiting hours are 7 a.m. to 8 p.m. Up to four visitors are allowed at one time in a patient room. The visitors may rotate out with other people during the day.  One visitor age 67 or older may stay with the patient overnight and must be in the room by 8 p.m.   Merchandiser, retail to address health-related social needs:  [email or web address]     Preparing for Surgery with CHLORHEXIDINE GLUCONATE (CHG) Soap  Chlorhexidine Gluconate (CHG) Soap  o An antiseptic cleaner that kills germs and bonds with the skin to continue killing germs even after washing  o Used for showering the night before surgery and morning of surgery  Before surgery, you can play an important role by reducing the number of germs on your skin.  CHG (Chlorhexidine gluconate) soap is an antiseptic cleanser which kills germs and bonds with the skin to continue killing germs even after washing.  Please do not use if you have an allergy to CHG or antibacterial soaps. If your skin becomes reddened/irritated stop using the CHG.  1. Shower the NIGHT BEFORE SURGERY and the MORNING OF SURGERY with CHG soap.  2. If you choose to wash your hair, wash your hair first as usual with your normal shampoo.  3. After shampooing, rinse your hair and body thoroughly to remove the shampoo.  4. Use CHG as you would any other liquid soap. You can apply CHG directly to the skin and wash gently with a scrungie or a clean washcloth.  5. Apply the CHG soap to your body only from the neck down. Do not use on open wounds or open sores. Avoid contact with your eyes, ears, mouth, and genitals (private parts). Wash face and  genitals (private parts) with your normal soap.  6. Wash thoroughly, paying special attention to the area where your surgery will be performed.  7. Thoroughly rinse your body with warm water .  8. Do not shower/wash with your normal soap after using and rinsing off the CHG soap.  9. Pat yourself dry with a clean towel.  10. Wear clean pajamas to bed the night before surgery.  12. Place clean sheets on your bed the night of your first shower and do not sleep with pets.  13. Shower again with the CHG soap on the day of surgery prior to arriving at the hospital.  14. Do not apply any deodorants/lotions/powders.  15. Please wear clean clothes to the hospital.

## 2023-09-12 DIAGNOSIS — K5792 Diverticulitis of intestine, part unspecified, without perforation or abscess without bleeding: Secondary | ICD-10-CM | POA: Diagnosis not present

## 2023-09-12 DIAGNOSIS — K5904 Chronic idiopathic constipation: Secondary | ICD-10-CM | POA: Diagnosis not present

## 2023-09-15 ENCOUNTER — Ambulatory Visit

## 2023-09-15 ENCOUNTER — Ambulatory Visit: Admitting: Urology

## 2023-09-15 ENCOUNTER — Other Ambulatory Visit: Payer: Self-pay

## 2023-09-15 ENCOUNTER — Encounter
Admission: RE | Disposition: A | Discharge status: Home / Self Care | Payer: Self-pay | Source: Home / Self Care | Attending: Surgery

## 2023-09-15 ENCOUNTER — Ambulatory Visit
Admission: RE | Admit: 2023-09-15 | Discharge: 2023-09-15 | Disposition: A | Discharge status: Home / Self Care | Attending: Surgery | Admitting: Surgery

## 2023-09-15 ENCOUNTER — Encounter: Payer: Self-pay | Admitting: Surgery

## 2023-09-15 DIAGNOSIS — K219 Gastro-esophageal reflux disease without esophagitis: Secondary | ICD-10-CM | POA: Diagnosis not present

## 2023-09-15 DIAGNOSIS — I10 Essential (primary) hypertension: Secondary | ICD-10-CM | POA: Insufficient documentation

## 2023-09-15 DIAGNOSIS — E785 Hyperlipidemia, unspecified: Secondary | ICD-10-CM | POA: Insufficient documentation

## 2023-09-15 DIAGNOSIS — I4891 Unspecified atrial fibrillation: Secondary | ICD-10-CM | POA: Diagnosis not present

## 2023-09-15 DIAGNOSIS — J449 Chronic obstructive pulmonary disease, unspecified: Secondary | ICD-10-CM | POA: Diagnosis not present

## 2023-09-15 DIAGNOSIS — G4730 Sleep apnea, unspecified: Secondary | ICD-10-CM | POA: Insufficient documentation

## 2023-09-15 DIAGNOSIS — K4090 Unilateral inguinal hernia, without obstruction or gangrene, not specified as recurrent: Secondary | ICD-10-CM | POA: Insufficient documentation

## 2023-09-15 DIAGNOSIS — Z87891 Personal history of nicotine dependence: Secondary | ICD-10-CM | POA: Diagnosis not present

## 2023-09-15 DIAGNOSIS — M1990 Unspecified osteoarthritis, unspecified site: Secondary | ICD-10-CM | POA: Insufficient documentation

## 2023-09-15 HISTORY — PX: HERNIORRHAPHY, INGUINAL, ROBOT-ASSISTED, LAPAROSCOPIC: SHX7585

## 2023-09-15 HISTORY — PX: INSERTION OF MESH: SHX5868

## 2023-09-15 SURGERY — HERNIORRHAPHY, INGUINAL, ROBOT-ASSISTED, LAPAROSCOPIC
Anesthesia: General | Site: Inguinal | Laterality: Left

## 2023-09-15 MED ORDER — CEFAZOLIN SODIUM-DEXTROSE 2-4 GM/100ML-% IV SOLN
2.0000 g | INTRAVENOUS | Status: AC
  Administered 2023-09-15: 2 g via INTRAVENOUS

## 2023-09-15 MED ORDER — LIDOCAINE HCL (CARDIAC) PF 100 MG/5ML IV SOSY
PREFILLED_SYRINGE | INTRAVENOUS | Status: DC | PRN
  Administered 2023-09-15: 100 mg via INTRAVENOUS

## 2023-09-15 MED ORDER — PROPOFOL 10 MG/ML IV BOLUS
INTRAVENOUS | Status: AC
  Filled 2023-09-15: qty 20

## 2023-09-15 MED ORDER — DROPERIDOL 2.5 MG/ML IJ SOLN: 0.6250 mg | Freq: Once | INTRAMUSCULAR | Status: DC | PRN

## 2023-09-15 MED ORDER — MIDAZOLAM HCL 2 MG/2ML IJ SOLN
INTRAMUSCULAR | Status: DC | PRN
Start: 2023-09-15 — End: 2023-09-15
  Administered 2023-09-15: 2 mg via INTRAVENOUS

## 2023-09-15 MED ORDER — FENTANYL CITRATE (PF) 100 MCG/2ML IJ SOLN
INTRAMUSCULAR | Status: AC
Start: 2023-09-15 — End: 2023-09-15
  Filled 2023-09-15: qty 2

## 2023-09-15 MED ORDER — 0.9 % SODIUM CHLORIDE (POUR BTL) OPTIME
TOPICAL | Status: DC | PRN
Start: 2023-09-15 — End: 2023-09-15
  Administered 2023-09-15: 500 mL

## 2023-09-15 MED ORDER — PROPOFOL 10 MG/ML IV BOLUS
INTRAVENOUS | Status: DC | PRN
  Administered 2023-09-15: 200 mg via INTRAVENOUS

## 2023-09-15 MED ORDER — HYDROCODONE-ACETAMINOPHEN 5-325 MG PO TABS: 1.0000 | ORAL_TABLET | Freq: Four times a day (QID) | ORAL | 0 refills | Status: DC | PRN

## 2023-09-15 MED ORDER — ACETAMINOPHEN 500 MG PO TABS
ORAL_TABLET | ORAL | Status: AC
  Filled 2023-09-15: qty 2

## 2023-09-15 MED ORDER — CHLORHEXIDINE GLUCONATE CLOTH 2 % EX PADS
6.0000 | MEDICATED_PAD | Freq: Once | CUTANEOUS | Status: AC
  Administered 2023-09-15: 6 via TOPICAL

## 2023-09-15 MED ORDER — IBUPROFEN 800 MG PO TABS: 800.0000 mg | ORAL_TABLET | Freq: Three times a day (TID) | ORAL | 0 refills | Status: DC | PRN

## 2023-09-15 MED ORDER — CELECOXIB 200 MG PO CAPS
ORAL_CAPSULE | ORAL | Status: AC
Start: 2023-09-15 — End: 2023-09-15
  Filled 2023-09-15: qty 1

## 2023-09-15 MED ORDER — GABAPENTIN 300 MG PO CAPS
300.0000 mg | ORAL_CAPSULE | ORAL | Status: AC
  Administered 2023-09-15: 300 mg via ORAL

## 2023-09-15 MED ORDER — CELECOXIB 200 MG PO CAPS
200.0000 mg | ORAL_CAPSULE | ORAL | Status: AC
  Administered 2023-09-15: 200 mg via ORAL

## 2023-09-15 MED ORDER — BUPIVACAINE LIPOSOME 1.3 % IJ SUSP
INTRAMUSCULAR | Status: AC
  Filled 2023-09-15: qty 20

## 2023-09-15 MED ORDER — GABAPENTIN 300 MG PO CAPS
ORAL_CAPSULE | ORAL | Status: AC
  Filled 2023-09-15: qty 1

## 2023-09-15 MED ORDER — BUPIVACAINE LIPOSOME 1.3 % IJ SUSP: 20.0000 mL | Freq: Once | INTRAMUSCULAR | Status: DC

## 2023-09-15 MED ORDER — ONDANSETRON HCL 4 MG/2ML IJ SOLN
INTRAMUSCULAR | Status: DC | PRN
  Administered 2023-09-15: 4 mg via INTRAVENOUS

## 2023-09-15 MED ORDER — CHLORHEXIDINE GLUCONATE 0.12 % MT SOLN
15.0000 mL | Freq: Once | OROMUCOSAL | Status: AC
  Administered 2023-09-15: 15 mL via OROMUCOSAL

## 2023-09-15 MED ORDER — HYDROMORPHONE HCL 1 MG/ML IJ SOLN
INTRAMUSCULAR | Status: AC
  Filled 2023-09-15: qty 1

## 2023-09-15 MED ORDER — ROCURONIUM BROMIDE 100 MG/10ML IV SOLN
INTRAVENOUS | Status: DC | PRN
Start: 2023-09-15 — End: 2023-09-15
  Administered 2023-09-15: 20 mg via INTRAVENOUS
  Administered 2023-09-15: 60 mg via INTRAVENOUS

## 2023-09-15 MED ORDER — MIDAZOLAM HCL 2 MG/2ML IJ SOLN
INTRAMUSCULAR | Status: AC
  Filled 2023-09-15: qty 2

## 2023-09-15 MED ORDER — FENTANYL CITRATE (PF) 100 MCG/2ML IJ SOLN
INTRAMUSCULAR | Status: DC | PRN
  Administered 2023-09-15: 100 ug via INTRAVENOUS

## 2023-09-15 MED ORDER — CHLORHEXIDINE GLUCONATE 0.12 % MT SOLN
OROMUCOSAL | Status: AC
Start: 2023-09-15 — End: 2023-09-15
  Filled 2023-09-15: qty 15

## 2023-09-15 MED ORDER — ORAL CARE MOUTH RINSE: 15.0000 mL | Freq: Once | OROMUCOSAL | Status: AC

## 2023-09-15 MED ORDER — CHLORHEXIDINE GLUCONATE CLOTH 2 % EX PADS: 6.0000 | MEDICATED_PAD | Freq: Once | CUTANEOUS | Status: DC

## 2023-09-15 MED ORDER — HYDROMORPHONE HCL 1 MG/ML IJ SOLN
INTRAMUSCULAR | Status: DC | PRN
  Administered 2023-09-15: .5 mg via INTRAVENOUS

## 2023-09-15 MED ORDER — DEXAMETHASONE SODIUM PHOSPHATE 10 MG/ML IJ SOLN
INTRAMUSCULAR | Status: DC | PRN
  Administered 2023-09-15: 5 mg via INTRAVENOUS

## 2023-09-15 MED ORDER — ROCURONIUM BROMIDE 10 MG/ML (PF) SYRINGE
PREFILLED_SYRINGE | INTRAVENOUS | Status: AC
  Filled 2023-09-15: qty 10

## 2023-09-15 MED ORDER — CEFAZOLIN SODIUM-DEXTROSE 2-4 GM/100ML-% IV SOLN
INTRAVENOUS | Status: AC
Start: 2023-09-15 — End: 2023-09-15
  Filled 2023-09-15: qty 100

## 2023-09-15 MED ORDER — BUPIVACAINE-EPINEPHRINE (PF) 0.25% -1:200000 IJ SOLN
INTRAMUSCULAR | Status: AC
  Filled 2023-09-15: qty 30

## 2023-09-15 MED ORDER — SUGAMMADEX SODIUM 200 MG/2ML IV SOLN
INTRAVENOUS | Status: DC | PRN
Start: 2023-09-15 — End: 2023-09-15
  Administered 2023-09-15: 100 mg via INTRAVENOUS
  Administered 2023-09-15: 200 mg via INTRAVENOUS

## 2023-09-15 MED ORDER — ACETAMINOPHEN 500 MG PO TABS
1000.0000 mg | ORAL_TABLET | ORAL | Status: AC
  Administered 2023-09-15: 1000 mg via ORAL

## 2023-09-15 MED ORDER — LIDOCAINE HCL (PF) 2 % IJ SOLN
INTRAMUSCULAR | Status: AC
  Filled 2023-09-15: qty 5

## 2023-09-15 MED ORDER — BUPIVACAINE-EPINEPHRINE (PF) 0.25% -1:200000 IJ SOLN
INTRAMUSCULAR | Status: DC | PRN
Start: 2023-09-15 — End: 2023-09-15
  Administered 2023-09-15: 30 mL

## 2023-09-15 MED ORDER — FENTANYL CITRATE (PF) 100 MCG/2ML IJ SOLN
25.0000 ug | INTRAMUSCULAR | Status: DC | PRN
  Administered 2023-09-15: 50 ug via INTRAVENOUS

## 2023-09-15 MED ORDER — FENTANYL CITRATE (PF) 100 MCG/2ML IJ SOLN
INTRAMUSCULAR | Status: AC
  Filled 2023-09-15: qty 2

## 2023-09-15 MED ORDER — LACTATED RINGERS IV SOLN: INTRAVENOUS | Status: DC

## 2023-09-15 SURGICAL SUPPLY — 39 items
BLADE CLIPPER SURG (BLADE) ×1 IMPLANT
COVER TIP SHEARS 8 DVNC (MISCELLANEOUS) ×1 IMPLANT
COVER WAND RF STERILE (DRAPES) ×1 IMPLANT
DEFOGGER SCOPE WARM SEASHARP (MISCELLANEOUS) ×1 IMPLANT
DERMABOND ADVANCED .7 DNX12 (GAUZE/BANDAGES/DRESSINGS) ×1 IMPLANT
DRAPE ARM DVNC X/XI (DISPOSABLE) ×3 IMPLANT
DRAPE COLUMN DVNC XI (DISPOSABLE) ×1 IMPLANT
DRAPE UTILITY 15X26 TOWEL STRL (DRAPES) ×1 IMPLANT
ELECTRODE REM PT RTRN 9FT ADLT (ELECTROSURGICAL) ×1 IMPLANT
FORCEPS BPLR R/ABLATION 8 DVNC (INSTRUMENTS) ×1 IMPLANT
GLOVE ORTHO TXT STRL SZ7.5 (GLOVE) ×3 IMPLANT
GOWN STRL REUS W/ TWL LRG LVL3 (GOWN DISPOSABLE) ×1 IMPLANT
GOWN STRL REUS W/ TWL XL LVL3 (GOWN DISPOSABLE) ×2 IMPLANT
GRASPER SUT TROCAR 14GX15 (MISCELLANEOUS) IMPLANT
IRRIGATION STRYKERFLOW (MISCELLANEOUS) IMPLANT
IV NS 1000ML BAXH (IV SOLUTION) IMPLANT
KIT PINK PAD W/HEAD ARM REST (MISCELLANEOUS) ×1 IMPLANT
LABEL OR SOLS (LABEL) ×1 IMPLANT
MANIFOLD NEPTUNE II (INSTRUMENTS) ×1 IMPLANT
MESH 3DMAX LIGHT 4.8X6.7 LT XL (Mesh General) IMPLANT
NDL DRIVE SUT CUT DVNC (INSTRUMENTS) ×1 IMPLANT
NDL HYPO 22X1.5 SAFETY MO (MISCELLANEOUS) ×1 IMPLANT
NDL INSUFFLATION 14GA 120MM (NEEDLE) IMPLANT
NEEDLE DRIVE SUT CUT DVNC (INSTRUMENTS) ×1 IMPLANT
NEEDLE HYPO 22X1.5 SAFETY MO (MISCELLANEOUS) ×1 IMPLANT
NEEDLE INSUFFLATION 14GA 120MM (NEEDLE) ×1 IMPLANT
NS IRRIG 500ML POUR BTL (IV SOLUTION) IMPLANT
PACK LAP CHOLECYSTECTOMY (MISCELLANEOUS) ×1 IMPLANT
SCISSORS MNPLR CVD DVNC XI (INSTRUMENTS) ×1 IMPLANT
SEAL UNIV 5-12 XI (MISCELLANEOUS) ×3 IMPLANT
SET TUBE SMOKE EVAC HIGH FLOW (TUBING) ×1 IMPLANT
SOLUTION ELECTROSURG ANTI STCK (MISCELLANEOUS) ×1 IMPLANT
SUT STRATA 2-0 23CM CT-2 (SUTURE) IMPLANT
SUT STRATA 3-0 SH (SUTURE) IMPLANT
SUT VIC AB 2-0 SH 27XBRD (SUTURE) ×1 IMPLANT
SUT VIC AB 3-0 SH 27X BRD (SUTURE) IMPLANT
SUT VICRYL 0 UR6 27IN ABS (SUTURE) IMPLANT
SUTURE MNCRL 4-0 27XMF (SUTURE) ×1 IMPLANT
TROCAR Z-THREAD FIOS 11X100 BL (TROCAR) IMPLANT

## 2023-09-15 NOTE — Transfer of Care (Signed)
 Immediate Anesthesia Transfer of Care Note  Patient: [name].  Procedure(s) Performed: HERNIORRHAPHY, INGUINAL, ROBOT-ASSISTED, LAPAROSCOPIC (Left: Inguinal) INSERTION OF MESH (Left: Inguinal)  Patient Location: PACU  Anesthesia Type:General  Level of Consciousness: sedated  Airway & Oxygen Therapy: Patient Spontanous Breathing and Patient connected to face mask oxygen  Post-op Assessment: Report given to RN and Post -op Vital signs reviewed and stable  Post vital signs: Reviewed and stable  Last Vitals:  Vitals Value Taken Time  BP 138/68 09/15/23 11:04  Temp 36.3 C 09/15/23 11:04  Pulse 49 09/15/23 11:08  Resp 12 09/15/23 11:08  SpO2 100 % 09/15/23 11:08  Vitals shown include unfiled device data.  Last Pain:  Vitals:   09/15/23 1104  TempSrc:   PainSc: Asleep         Complications: No notable events documented.

## 2023-09-15 NOTE — Op Note (Signed)
 Robotic assisted Laparoscopic Transabdominal Left Inguinal Hernia Repair with Mesh       Pre-operative Diagnosis:  Left  Inguinal Hernia   Post-operative Diagnosis: Same   Procedure: Robotic assisted Laparoscopic  repair of left inguinal hernia(s)   Surgeon: [name], M.D., FACS   Anesthesia: GETA   Findings: left  inguinal hernia,   evidence of prior right sided hernia repair.         Procedure Details  The patient was seen again in the Holding Room. The benefits, complications, treatment options, and expected outcomes were discussed with the patient. The risks of bleeding, infection, recurrence of symptoms, failure to resolve symptoms, recurrence of hernia, ischemic orchitis, chronic pain syndrome or neuroma, were reviewed again. The likelihood of improving the patient's symptoms with return to their baseline status is good.  The patient and/or family concurred with the proposed plan, giving informed consent.  The patient was taken to Operating Room, identified  and the procedure verified as Laparoscopic Inguinal Hernia Repair. Laterality confirmed.  A Time Out was held and the above information confirmed.   Prior to the induction of general anesthesia, antibiotic prophylaxis was administered. VTE prophylaxis was in place. General endotracheal anesthesia was then administered and tolerated well. After the induction, the abdomen was prepped with Chloraprep and draped in the sterile fashion. The patient was positioned in the supine position.   After local infiltration of quarter percent Marcaine  with epinephrine , stab incision was made left upper quadrant.  On the left at Palmer's point, the Veress needle is passed with sensation of the layers to penetrate the abdominal wall and into the peritoneum.  Saline drop test is confirmed peritoneal placement.  Insufflation is initiated with carbon dioxide to pressures of 15 mmHg. An 8.5 mm port is placed to the left off of the midline, with  blunt tipped trocar.  Pneumoperitoneum maintained w/o HD changes  to pressures of 15 mm Hg with CO2. No evidence of bowel injuries.  Two 8.5 mm ports placed under direct vision in each upper quadrant. The laparoscopy revealed a minimal left sided indirect defect(s), after mobilization of the peritoneal layer scarred with sigmoid colon, and the underlying lipoma.   The robot was brought to the table and docked in the standard fashion, no collision between arms was observed. Instruments were kept under direct view at all times. For left inguinal hernia repair,  I developed a peritoneal flap. The sac(s) were reduced and dissected free from adjacent structures. We preserved the vas and the vessels, and visualized them to their convergence and beyond in the retroperitoneum. Once dissection was completed a left sided extra large  BARD 3D Light mesh was placed and secured at three points with interrupted 2-0 Vicryl to the pubic tubercle and anteriorly. There was good coverage of the direct, indirect and femoral spaces.  Second look revealed no complications or injuries.  The flap was then closed with 3-0 V-lock suture.  Peritoneal closure without defects.  Once assuring that hemostasis was adequate, all needles/sponges removed, and the robot was undocked.  Under direct visualization I placed the Veress needle into the preperitoneal space the Veress' valve was released allowing extraperitoneal CO2 to escape, it was also used to access the space for supplemental local anesthesia. The ports were removed, the abdomen desulflated.  4-0 subcuticular Monocryl was used at all skin edges. Dermabond was placed.  Patient tolerated the procedure well. There were no complications. He was taken to the recovery room in stable condition.  [name], M.D., FACS 09/15/2023, 10:59 AM

## 2023-09-15 NOTE — Anesthesia Preprocedure Evaluation (Signed)
 Anesthesia Evaluation  Patient identified by MRN, date of birth, ID band Patient awake    Reviewed: Allergy & Precautions, H&P , NPO status , Patient's Chart, lab work & pertinent test results  History of Anesthesia Complications Negative for: history of anesthetic complications  Airway Mallampati: I  TM Distance: >3 FB Neck ROM: Full    Dental  (+) Caps, Dental Advidsory Given, Teeth Intact Both upper central incisors are capped, plus multiple other teeth:   Pulmonary shortness of breath and with exertion, sleep apnea , COPD, neg recent URI, former smoker Supposed to use CPAP, but has severe claustrophobia and cannot use it.    Pulmonary exam normal breath sounds clear to auscultation       Cardiovascular hypertension, (-) angina (-) Past MI, (-) Cardiac Stents and (-) CABG Normal cardiovascular exam+ dysrhythmias Atrial Fibrillation (-) Valvular Problems/Murmurs Rhythm:Regular Rate:Normal  Had cardiac ablation and is doing well from that, heart rate regular today   Neuro/Psych  Headaches, neg Seizures PSYCHIATRIC DISORDERS Anxiety Depression    Extremely anxious today, has difficult vein access, and is very anxious about IV. Severe claustrophobia as well. Neuromuscular disease    GI/Hepatic Neg liver ROS, hiatal hernia,GERD  ,,  Endo/Other  negative endocrine ROS    Renal/GU negative Renal ROS  negative genitourinary   Musculoskeletal  (+) Arthritis ,    Abdominal   Peds negative pediatric ROS (+)  Hematology negative hematology ROS (+)   Anesthesia Other Findings Past Medical History: No date: Anxiety No date: Arthritis 11/19/2013: BPH (benign prostatic hyperplasia) No date: Bulge of lumbar disc without myelopathy     Comment:  L2/3 through L5/6 No date: Bulging disc     Comment:  C2/3, C3/4, C6/7 No date: Cervical disc herniation     Comment:  C4/5 and C5/6 No date: Claustrophobia 04/18/2011: Colon  polyp 08/31/2012: Constipation No date: COPD (chronic obstructive pulmonary disease) (HCC) No date: Depression No date: Diverticulosis No date: Dyspnea 01/04/2012: ED (erectile dysfunction) of organic origin No date: GERD (gastroesophageal reflux disease) No date: H/O diverticulitis of colon No date: Hemorrhoid No date: Hiatal hernia No date: Hx of atrial flutter No date: Hyperlipidemia No date: Hypertension No date: Left inguinal hernia No date: Levoscoliosis No date: Motion sickness     Comment:  boats, planes No date: Sleep apnea     Comment:  No CPAP No date: Sleep difficulties No date: Spinal stenosis in cervical region     Comment:  cord abutment C4/5 No date: Testicular pain, left 11/2015: Typical atrial flutter (HCC)     Comment:  a. CHADS2VASc => 0; b. on Eliquis  for pending ablation    Reproductive/Obstetrics negative OB ROS                              Anesthesia Physical Anesthesia Plan  ASA: 3  Anesthesia Plan: General   Post-op Pain Management:    Induction: Intravenous  PONV Risk Score and Plan: 2 and Ondansetron , Dexamethasone , Midazolam  and Treatment may vary due to age or medical condition  Airway Management Planned: Oral ETT  Additional Equipment:   Intra-op Plan:   Post-operative Plan: Extubation in OR  Informed Consent: I have reviewed the patients History and Physical, chart, labs and discussed the procedure including the risks, benefits and alternatives for the proposed anesthesia with the patient or authorized representative who has indicated his/her understanding and acceptance.     Dental Advisory Given  Plan  Discussed with: Anesthesiologist, CRNA and Surgeon  Anesthesia Plan Comments: (Patient consented for risks of anesthesia including but not limited to:  - adverse reactions to medications - risk of airway placement if required - damage to eyes, teeth, lips or other oral mucosa - nerve damage due to  positioning  - sore throat or hoarseness - Damage to heart, brain, nerves, lungs, other parts of body or loss of life  Patient voiced understanding.)         Anesthesia Quick Evaluation

## 2023-09-15 NOTE — Anesthesia Procedure Notes (Signed)
 Procedure Name: Intubation Date/Time: 09/15/2023 9:20 AM  Performed by: Brien Sotero PARAS, CRNAPre-anesthesia Checklist: Patient identified, Patient being monitored, Timeout performed, Emergency Drugs available and Suction available Patient Re-evaluated:Patient Re-evaluated prior to induction Oxygen Delivery Method: Circle system utilized Preoxygenation: Pre-oxygenation with 100% oxygen Induction Type: IV induction Ventilation: Mask ventilation without difficulty Laryngoscope Size: 4 and McGrath Grade View: Grade I Tube type: Oral Tube size: 7.0 mm Number of attempts: 1 Airway Equipment and Method: Stylet Placement Confirmation: ETT inserted through vocal cords under direct vision, positive ETCO2 and breath sounds checked- equal and bilateral Secured at: 22 cm Tube secured with: Tape Dental Injury: Teeth and Oropharynx as per pre-operative assessment

## 2023-09-15 NOTE — Interval H&P Note (Signed)
 History and Physical Interval Note:  09/15/2023 9:01 AM  [name].  has presented today for surgery, with the diagnosis of Left inguinal hernia.  The various methods of treatment have been discussed with the patient and family. After consideration of risks, benefits and other options for treatment, the patient has consented to  Procedure(s): HERNIORRHAPHY, INGUINAL, ROBOT-ASSISTED, LAPAROSCOPIC (Left) as a surgical intervention.  The patient's history has been reviewed, patient examined, no change in status, stable for surgery.  I have reviewed the patient's chart and labs.  Questions were answered to the patient's satisfaction.   The left side is marked.   [name]

## 2023-09-17 NOTE — Anesthesia Postprocedure Evaluation (Signed)
 Anesthesia Post Note  Patient: Tyler J Stetzer Jr.  Procedure(s) Performed: HERNIORRHAPHY, INGUINAL, ROBOT-ASSISTED, LAPAROSCOPIC (Left: Inguinal) INSERTION OF MESH (Left: Inguinal)  Patient location during evaluation: PACU Anesthesia Type: General Level of consciousness: awake and alert Pain management: pain level controlled Vital Signs Assessment: post-procedure vital signs reviewed and stable Respiratory status: spontaneous breathing, nonlabored ventilation, respiratory function stable and patient connected to nasal cannula oxygen Cardiovascular status: blood pressure returned to baseline and stable Postop Assessment: no apparent nausea or vomiting Anesthetic complications: no   No notable events documented.   Last Vitals:  Vitals:   09/15/23 1215 09/15/23 1229  BP: (!) 146/79 (!) 164/77  Pulse: (!) 57 (!) 51  Resp: 18 18  Temp: 36.4 C   SpO2: 100% 100%    Last Pain:  Vitals:   09/15/23 1229  TempSrc:   PainSc: 5                  Prentice Murphy

## 2023-09-18 ENCOUNTER — Encounter: Payer: Self-pay | Admitting: Surgery

## 2023-09-20 ENCOUNTER — Encounter: Payer: Self-pay | Admitting: Surgery

## 2023-09-28 ENCOUNTER — Encounter: Payer: Self-pay | Admitting: Surgery

## 2023-09-28 ENCOUNTER — Ambulatory Visit (INDEPENDENT_AMBULATORY_CARE_PROVIDER_SITE_OTHER): Admitting: Surgery

## 2023-09-28 VITALS — BP 132/76 | HR 60 | Temp 98.3°F | Ht 70.0 in | Wt 199.4 lb

## 2023-09-28 DIAGNOSIS — Z09 Encounter for follow-up examination after completed treatment for conditions other than malignant neoplasm: Secondary | ICD-10-CM

## 2023-09-28 DIAGNOSIS — Z8719 Personal history of other diseases of the digestive system: Secondary | ICD-10-CM

## 2023-09-28 DIAGNOSIS — K4090 Unilateral inguinal hernia, without obstruction or gangrene, not specified as recurrent: Secondary | ICD-10-CM

## 2023-09-28 NOTE — Patient Instructions (Signed)

## 2023-09-28 NOTE — Progress Notes (Signed)
 [facility] POST-OP OFFICE VISIT  09/28/2023  HPI: [name]. is a 68 y.o. male had surgery on this date, 2025, now s/p robotic left inguinal hernia repair.  He reports quick resolution of pain and discomfort.  Some lingering discomfort in the left testicle, but improving day by day.  No issues with the incisions.  Denies fevers or chills.  Reports doing daily activities without issue, currently avoiding any heavy lifting still.  He reports ecchymotic change in the left scrotum having resolved quickly.  Vital signs: BP 132/76   Pulse 60   Temp 98.3 F (36.8 C) (Oral)   Ht 5' 10 (1.778 m)   Wt 199 lb 6.4 oz (90.4 kg)   SpO2 98%   BMI 28.61 kg/m    Physical Exam: Constitutional: He appears well, Abdomen: Soft benign nontender. Skin: Incisions are clean dry and intact with Dermabond flaking off, left groin without swelling or change with Valsalva.  Left testicle mildly tender to palpation.  Assessment/Plan: This is a 68 y.o. male status post robotic left inguinal hernia.  Progressing well.  Patient Active Problem List   Diagnosis Date Noted   Diverticulitis of colon without hemorrhage 08/29/2023   Left inguinal hernia 07/27/2023   Fatty liver 07/27/2023   LLQ pain 07/11/2023   PND (post-nasal drip) 02/21/2023   Insomnia 10/31/2022   History of colonic polyps 07/07/2022   Vision changes 02/18/2022   Rosacea 11/12/2021   COPD (chronic obstructive pulmonary disease) (HCC) 09/15/2021   Aortic atherosclerosis (HCC) 09/15/2021   T9 vertebral fracture, sequela 03/25/2021   Levoscoliosis of thoracic spine 02/25/2021   Chronic midline thoracic back pain 02/25/2021   Claustrophobia 02/04/2021   Thoracic compression fracture, sequela (T1 & T2) 02/04/2021   Cervicalgia 01/19/2021   Spondylosis without myelopathy or radiculopathy, cervical region 01/19/2021   Long term prescription benzodiazepine use 01/04/2021   Chronic neck pain (1ry area of Pain) (Bilateral)  (L>R) 01/04/2021   Cervicogenic headache (3ry area of Pain) (Bilateral) 01/04/2021   Lumbosacral radiculopathy at S1 (Left) 01/04/2021   Chronic upper back pain (6th area of Pain) 01/04/2021   Cervical facet syndrome (Bilateral) 01/04/2021   Abnormal MRI, lumbar spine (11/18/2017) 01/04/2021   DDD (degenerative disc disease), lumbar 01/04/2021   Lumbar facet arthropathy (Multilevel) (Bilateral) 01/04/2021   Lumbar facet syndrome 01/04/2021   Abnormal MRI, cervical spine (02/17/2021) 01/04/2021   DDD (degenerative disc disease), cervical 01/04/2021   Cervical facet hypertrophy 01/04/2021   Grade 1 Cervical Retrolisthesis of C4/C5 and C5/C6 01/04/2021   Chronic pain syndrome 01/01/2021   Pharmacologic therapy 01/01/2021   Disorder of skeletal system 01/01/2021   Problems influencing health status 01/01/2021   Skin cyst 06/03/2020   Epididymal cyst 06/03/2020   History of Helicobacter pylori infection 05/17/2019   GERD (gastroesophageal reflux disease) 01/16/2018   Sleep apnea 01/16/2018   Chronic upper extremity pain (2ry area of Pain) (Bilateral) (L>R) 12/13/2017   Chronic lower extremity pain (5th area of Pain) (Bilateral) (L>R) 12/13/2017   Chronic cervical radiculopathy (C7, C8) (Left) 01/23/2017   Hypertension 01/23/2017   Chronic constipation 05/19/2016   Chronic low back pain with left-sided sciatica 03/30/2016   History of atrial flutter    Elevated PSA 12/24/2015   Dyspnea    Adjustment disorder with mixed anxiety and depressed mood 07/23/2015   OAB (overactive bladder) 03/31/2015   Viral upper respiratory illness 11/04/2014   BPH (benign prostatic hyperplasia) 11/19/2013   Hyperlipidemia 11/19/2013   Cervical foraminal stenosis (C4-5, C5-6) (  Left) 03/08/2013   Lipoma of back 03/08/2013   Benign prostatic hyperplasia with urinary obstruction 08/23/2012   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/2012   Colon polyp 04/18/2011    - Will be readily available to  see him back in follow-up should he have any persistent or lingering or unexpected pain in the month or more to come.  Otherwise he may follow-up as needed.   [name] M.D., FACS 09/28/2023, 9:39 AM

## 2023-10-06 ENCOUNTER — Other Ambulatory Visit: Payer: Self-pay

## 2023-10-06 ENCOUNTER — Emergency Department

## 2023-10-06 ENCOUNTER — Emergency Department
Admission: EM | Admit: 2023-10-06 | Discharge: 2023-10-06 | Disposition: A | Discharge status: Home / Self Care | Attending: Emergency Medicine | Admitting: Emergency Medicine

## 2023-10-06 DIAGNOSIS — R079 Chest pain, unspecified: Secondary | ICD-10-CM | POA: Diagnosis not present

## 2023-10-06 DIAGNOSIS — R531 Weakness: Secondary | ICD-10-CM | POA: Diagnosis not present

## 2023-10-06 DIAGNOSIS — R0789 Other chest pain: Secondary | ICD-10-CM | POA: Diagnosis not present

## 2023-10-06 DIAGNOSIS — I10 Essential (primary) hypertension: Secondary | ICD-10-CM | POA: Diagnosis not present

## 2023-10-06 DIAGNOSIS — R200 Anesthesia of skin: Secondary | ICD-10-CM | POA: Diagnosis not present

## 2023-10-06 DIAGNOSIS — R202 Paresthesia of skin: Secondary | ICD-10-CM | POA: Diagnosis not present

## 2023-10-06 DIAGNOSIS — J449 Chronic obstructive pulmonary disease, unspecified: Secondary | ICD-10-CM | POA: Insufficient documentation

## 2023-10-06 LAB — BASIC METABOLIC PANEL WITH GFR
Anion gap: 9 (ref 5–15)
BUN: 17 mg/dL (ref 8–23)
CO2: 28 mmol/L (ref 22–32)
Calcium: 9.6 mg/dL (ref 8.9–10.3)
Chloride: 102 mmol/L (ref 98–111)
Creatinine, Ser: 0.93 mg/dL (ref 0.61–1.24)
GFR, Estimated: >60 mL/min (ref >60)
Glucose, Bld: 89 mg/dL (ref 70–99)
Potassium: 4 mmol/L (ref 3.5–5.1)
Sodium: 139 mmol/L (ref 135–145)

## 2023-10-06 LAB — TROPONIN I (HIGH SENSITIVITY)
Troponin I (High Sensitivity): 4 ng/L (ref <18)
Troponin I (High Sensitivity): 4 ng/L (ref <18)

## 2023-10-06 LAB — CBC
HCT: 42 % (ref 39.0–52.0)
Hemoglobin: 14.1 g/dL (ref 13.0–17.0)
MCH: 29.6 pg (ref 26.0–34.0)
MCHC: 33.6 g/dL (ref 30.0–36.0)
MCV: 88.1 fL (ref 80.0–100.0)
Platelets: 229 K/uL (ref 150–400)
RBC: 4.77 MIL/uL (ref 4.22–5.81)
RDW: 13.2 % (ref 11.5–15.5)
WBC: 5.3 K/uL (ref 4.0–10.5)
nRBC: 0 % (ref 0.0–0.2)

## 2023-10-06 MED ORDER — DIAZEPAM 5 MG/ML IJ SOLN: 2.5000 mg | Freq: Four times a day (QID) | INTRAMUSCULAR | Status: DC | PRN

## 2023-10-06 NOTE — ED Notes (Signed)
Pt on the phone with MRI at this time

## 2023-10-06 NOTE — ED Triage Notes (Signed)
 Pt to ED via POV from home. Pt reports woke up sweaty with left sided CP and left arm numbness. Pt with hx of ablation. No blood thinners.

## 2023-10-06 NOTE — ED Provider Notes (Signed)
 [facility] Provider Note    Event Date/Time   First MD Initiated Contact with Patient 10/06/23 0957     (approximate)  History   Chief Complaint: Chest Pain and Numbness  HPI  Tanav Orsak. is a 68 y.o. male with a past medical history of anxiety, claustrophobia, COPD, gastric reflux, hypertension, hyperlipidemia, presents to the emergency department with left arm numbness sweating and slight chest discomfort.  According to the patient shortly before arrival patient states that he acutely developed chest tightness became slightly sweaty and felt a discomfort or numbness to the left upper extremity.  Patient states the chest discomfort and sweating episode resolved within minutes however he continued to have some numbness and weakness sensation to the left upper extremity.  No history of CVA.  No history of heart attack or stents but states he did have a cardiac ablation years ago.  Patient is approximately 3 weeks status post hernia repair of the abdomen but his abdomen is nontender denies any abdominal pain.  Physical Exam   Triage Vital Signs: ED Triage Vitals  Encounter Vitals Group     BP 10/06/23 0953 (!) 145/81     Girls Systolic BP Percentile --      Girls Diastolic BP Percentile --      Boys Systolic BP Percentile --      Boys Diastolic BP Percentile --      Pulse Rate 10/06/23 0953 70     Resp 10/06/23 0953 18     Temp 10/06/23 0953 98.1 F (36.7 C)     Temp Source 10/06/23 0953 Oral     SpO2 10/06/23 0953 98 %     Weight --      Height --      Head Circumference --      Peak Flow --      Pain Score 10/06/23 0954 3     Pain Loc --      Pain Education --      Exclude from Growth Chart --     Most recent vital signs: Vitals:   10/06/23 0953  BP: (!) 145/81  Pulse: 70  Resp: 18  Temp: 98.1 F (36.7 C)  SpO2: 98%    General: Awake, no distress.  CV:  Good peripheral perfusion.  Regular rate and rhythm  Resp:  Normal effort.   Equal breath sounds bilaterally.  Abd:  No distention.  Soft, nontender.  No rebound or guarding.  Incisions are well-appearing, still has skin adhesive in place.  Benign abdomen. Other:  Patient has slightly diminished grip strength in left upper extremity compared to the right.  No pronator drift.  5/5 in bilateral lower extremities.  No cranial nerve deficits.   ED Results / Procedures / Treatments   EKG  EKG done interpreted by myself shows a sinus rhythm at 72 bpm with a narrow QRS, normal axis, normal intervals, no concerning ST changes.  RADIOLOGY  I have reviewed interpreted chest x-ray images.  No obvious consolidation on my evaluation. Radiology has read the x-ray is negative   MEDICATIONS ORDERED IN ED: Medications  diazepam  (VALIUM ) injection 2.5-5 mg (has no administration in time range)     IMPRESSION / MDM / ASSESSMENT AND PLAN / ED COURSE  I reviewed the triage vital signs and the nursing notes.  Patient's presentation is most consistent with acute presentation with potential threat to life or bodily function.  Patient presents to the emergency department with complaints  of chest pain as well as left arm numbness.  Symptoms started acutely just before arrival.  Chest tightness and diaphoresis have resolved.  Patient continues to state slight numbness in the left upper extremity.  Patient does have mild diminished grip strength left upper extremity however no pronator drift.  5/5 motor in lower extremities.  Denies any headache.  Given the patient's chest tightness with diaphoresis this morning we will check labs including cardiac enzymes x 2.  EKG shows no concerning finding.  Chest x-ray pending.  Given the patient's complaint of left arm weakness and numbness I believe the patient will need an MRI to further evaluate.  He does have diminished grip strength although he has no pronator drift and appears to have good strength.  Patient has a history of claustrophobia but  states he has had an MRI before as long as he gets anxiety medication.  Will attempt to obtain an MRI if possible.  Patient's workup is reassuring, normal CBC normal chemistry negative troponin x 2.  Patient's MRI has resulted and it is normal as well.  Patient states his symptoms have largely resolved occasionally still has a tingling sensation in his left hand.  Discussed possibility of peripheral neuropathy or peripheral neuropraxia.  However given the patient's reassuring workup reassuring MRI reassuring labs and troponin negative x 2 believe the patient safe for discharge home with outpatient follow-up.  Patient is agreeable and states he is ready go.  FINAL CLINICAL IMPRESSION(S) / ED DIAGNOSES   Chest pain   Note:  This document was prepared using Dragon voice recognition software and may include unintentional dictation errors.   Dorothyann Drivers, MD 10/06/23 [phone]

## 2023-10-10 ENCOUNTER — Telehealth: Payer: Self-pay | Admitting: Surgery

## 2023-10-10 NOTE — Telephone Encounter (Signed)
 Called patient and he stated that the swelling in his testicle is not bad, he stated that its not swelling a lot but its been like that since surgery. He has not applied ice to the area, so I advise him that he should apply ice. The pain in his lower abdomen is a pulling sensation, I told him that is normal and is a sign of healing. I did offer him an appointment but he decline at the moment because he is going out of town. He will call back if things get worse or swelling doesn't go away.

## 2023-10-10 NOTE — Telephone Encounter (Signed)
 Pt said that on 09-15-2023 he has l side hernia. He is have some pain but not to bad but his testicle is swollen and lower stomach is having some pain. Just want to know if this is normal since has been 4 weeks since surgery. Please advise. Pt # is [phone].

## 2023-10-21 ENCOUNTER — Other Ambulatory Visit: Payer: Self-pay | Admitting: Urology

## 2023-10-21 DIAGNOSIS — R972 Elevated prostate specific antigen [PSA]: Secondary | ICD-10-CM

## 2023-10-23 NOTE — Progress Notes (Unsigned)
 [facility] POST-OP OFFICE VISIT  10/24/2023  HPI: Tyler Kost. is a 68 y.o. male had surgery on September 15, 2023, now s/p robotic left inguinal hernia repair.  He reports some lingering discomfort in the left testicle, no longer improving, but also not getting any worse.  No issues with the incisions, the left lower quadrant or groin.  Denies fevers or chills.  Reports doing daily activities without issue, but sometimes a change in position will demonstrate the tenderness that he still experiences there.  He has not trialed any medications to help with the tenderness, anti-inflammatories, Advil  etc.  Vital signs: BP 135/80   Pulse 61   Temp 98 F (36.7 C) (Oral)   Ht 5' 10 (1.778 m)   Wt 200 lb (90.7 kg)   SpO2 99%   BMI 28.70 kg/m    Physical Exam: Constitutional: He appears well, Abdomen: Soft benign nontender. Skin: Left groin without swelling or change with Valsalva.  Left scrotal skin, somewhat hypersensitive to light touch, with testicle swollen/tender to palpation.  Question persistent hydrocele.  I did not attempt to transilluminate.  Assessment/Plan: This is a 68 y.o. male status post robotic left inguinal hernia.  Lingering left scrotal tenderness, history of hydrocele, potential nerve pain.  Patient Active Problem List   Diagnosis Date Noted   Diverticulitis of colon without hemorrhage 08/29/2023   Left inguinal hernia 07/27/2023   Fatty liver 07/27/2023   LLQ pain 07/11/2023   PND (post-nasal drip) 02/21/2023   Insomnia 10/31/2022   History of colonic polyps 07/07/2022   Vision changes 02/18/2022   Rosacea 11/12/2021   COPD (chronic obstructive pulmonary disease) (HCC) 09/15/2021   Aortic atherosclerosis (HCC) 09/15/2021   T9 vertebral fracture, sequela 03/25/2021   Levoscoliosis of thoracic spine 02/25/2021   Chronic midline thoracic back pain 02/25/2021   Claustrophobia 02/04/2021   Thoracic compression fracture, sequela (T1 & T2)  02/04/2021   Cervicalgia 01/19/2021   Spondylosis without myelopathy or radiculopathy, cervical region 01/19/2021   Long term prescription benzodiazepine use 01/04/2021   Chronic neck pain (1ry area of Pain) (Bilateral) (L>R) 01/04/2021   Cervicogenic headache (3ry area of Pain) (Bilateral) 01/04/2021   Lumbosacral radiculopathy at S1 (Left) 01/04/2021   Chronic upper back pain (6th area of Pain) 01/04/2021   Cervical facet syndrome (Bilateral) 01/04/2021   Abnormal MRI, lumbar spine (11/18/2017) 01/04/2021   DDD (degenerative disc disease), lumbar 01/04/2021   Lumbar facet arthropathy (Multilevel) (Bilateral) 01/04/2021   Lumbar facet syndrome 01/04/2021   Abnormal MRI, cervical spine (02/17/2021) 01/04/2021   DDD (degenerative disc disease), cervical 01/04/2021   Cervical facet hypertrophy 01/04/2021   Grade 1 Cervical Retrolisthesis of C4/C5 and C5/C6 01/04/2021   Chronic pain syndrome 01/01/2021   Pharmacologic therapy 01/01/2021   Disorder of skeletal system 01/01/2021   Problems influencing health status 01/01/2021   Skin cyst 06/03/2020   Epididymal cyst 06/03/2020   History of Helicobacter pylori infection 05/17/2019   GERD (gastroesophageal reflux disease) 01/16/2018   Sleep apnea 01/16/2018   Chronic upper extremity pain (2ry area of Pain) (Bilateral) (L>R) 12/13/2017   Chronic lower extremity pain (5th area of Pain) (Bilateral) (L>R) 12/13/2017   Chronic cervical radiculopathy (C7, C8) (Left) 01/23/2017   Hypertension 01/23/2017   Chronic constipation 05/19/2016   Chronic low back pain with left-sided sciatica 03/30/2016   History of atrial flutter    Elevated PSA 12/24/2015   Dyspnea    Adjustment disorder with mixed anxiety and depressed mood 07/23/2015  OAB (overactive bladder) 03/31/2015   Viral upper respiratory illness 11/04/2014   BPH (benign prostatic hyperplasia) 11/19/2013   Hyperlipidemia 11/19/2013   Cervical foraminal stenosis (C4-5, C5-6) (Left)  03/08/2013   Lipoma of back 03/08/2013   Benign prostatic hyperplasia with urinary obstruction 08/23/2012   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  - He reports he has consultation with urology in the near future, I would defer consideration of other etiologies aside from postoperative sequelae.  Will have him follow-up in 1 month to assess progress.   [name] M.D., FACS 10/24/2023, 9:15 AM

## 2023-10-24 ENCOUNTER — Encounter: Payer: Self-pay | Admitting: Surgery

## 2023-10-24 ENCOUNTER — Ambulatory Visit (INDEPENDENT_AMBULATORY_CARE_PROVIDER_SITE_OTHER): Admitting: Surgery

## 2023-10-24 VITALS — BP 135/80 | HR 61 | Temp 98.0°F | Ht 70.0 in | Wt 200.0 lb

## 2023-10-24 DIAGNOSIS — Z09 Encounter for follow-up examination after completed treatment for conditions other than malignant neoplasm: Secondary | ICD-10-CM

## 2023-10-24 DIAGNOSIS — K4090 Unilateral inguinal hernia, without obstruction or gangrene, not specified as recurrent: Secondary | ICD-10-CM

## 2023-10-24 DIAGNOSIS — N5089 Other specified disorders of the male genital organs: Secondary | ICD-10-CM

## 2023-10-24 NOTE — Patient Instructions (Addendum)
 We ordered a Scrotum Ultrasound, this will be done at [facility]  on 10/25/23, please arrive at 9:30am for a 10am appointment.   We will see you back in a month to see how you are doing    Take a low dose of ibuprofen  3 times a day for 2 weeks to see if that will help with the swelling   Please call our office if you have any questions or concerns

## 2023-10-25 ENCOUNTER — Ambulatory Visit
Admission: RE | Admit: 2023-10-25 | Discharge: 2023-10-25 | Disposition: A | Discharge status: Home / Self Care | Source: Ambulatory Visit | Attending: Surgery | Admitting: Surgery

## 2023-10-25 DIAGNOSIS — N5089 Other specified disorders of the male genital organs: Secondary | ICD-10-CM | POA: Diagnosis not present

## 2023-10-25 DIAGNOSIS — N433 Hydrocele, unspecified: Secondary | ICD-10-CM | POA: Diagnosis not present

## 2023-11-01 ENCOUNTER — Other Ambulatory Visit: Payer: Self-pay

## 2023-11-01 DIAGNOSIS — N401 Enlarged prostate with lower urinary tract symptoms: Secondary | ICD-10-CM

## 2023-11-01 DIAGNOSIS — R972 Elevated prostate specific antigen [PSA]: Secondary | ICD-10-CM

## 2023-11-02 ENCOUNTER — Other Ambulatory Visit: Payer: Self-pay

## 2023-11-02 DIAGNOSIS — N401 Enlarged prostate with lower urinary tract symptoms: Secondary | ICD-10-CM | POA: Diagnosis not present

## 2023-11-02 DIAGNOSIS — N138 Other obstructive and reflux uropathy: Secondary | ICD-10-CM

## 2023-11-03 LAB — PSA: Prostate Specific Ag, Serum: 1 ng/mL (ref 0.0–4.0)

## 2023-11-08 NOTE — Progress Notes (Unsigned)
 9:26 AM   [name]. 1955-12-10 [id number]  Referring provider: Maribeth Camellia MATSU, MD [address] [place],  [place] [address]  Urological history: 1. BPH with LU TS -PSA (10/2023) 1.07 -tadalafil  5 mg daily and finasteride  5 mg daily   2. Nocturia -Myrbetriq  cost prohibitive -Gemtesa  cost prohibitive  3. Erectile dysfunction -not sexually active  4. Epididymal cyst -scrotal ultrasound (2015) 3 mm left epididymal cyst   5. Renal cyst -contrast CT 2018 -  3.2 cm right mid pole renal cyst -contrast  CT (07/2023) - right simple cyst  Chief Complaint  Patient presents with   Follow-up    HPI: Tyler Ayalais a 68 y.o. man who presents today for frequency.    Previous records reviewed.   He had a left hernia repair on September 15, 2023.  He states since that time he has been having left inguinal pain and his left hydrocele became larger.  He states the hydrocele will change in size throughout the day.  He also states the left inguinal pain is variable throughout the day.  It seems that standing can make the pain worse, because he states when he has been standing for a while he will notice a sharp pain in the left inguinal area.  I PSS 18/3  He continues to experiencing frequency and urgency, but he has not yet tried the Gemtesa  samples that I gave him on July 8 because he wanted to get the hernia surgery behind him before he started a new medication.  He is having nocturia x 2.  Patient denies any modifying or aggravating factors.  Patient denies any recent UTI's, gross hematuria, dysuria or suprapubic/flank pain.  Patient denies any fevers, chills, nausea or vomiting.     PSA (10/2023) 1.0  Hbg A1c (07/2023) 5.8  UA (07/2023) negative  Urine culture (07/2023) no growth  Serum creatintine (07/2023) 0.89, eGFR 88.87  He is not sexually active.    PMH: Past Medical History:  Diagnosis Date   Anxiety    Arthritis    BPH (benign prostatic  hyperplasia) 11/19/2013   Bulge of lumbar disc without myelopathy    L2/3 through L5/6   Bulging disc    C2/3, C3/4, C6/7   Cervical disc herniation    C4/5 and C5/6   Claustrophobia    Colon polyp 04/18/2011   Constipation 08/31/2012   COPD (chronic obstructive pulmonary disease) (HCC)    Depression    Diverticulosis    Dyspnea    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/2012   GERD (gastroesophageal reflux disease)    H/O diverticulitis of colon    Hemorrhoid    Hiatal hernia    Hx of atrial flutter    Hyperlipidemia    Hypertension    Left inguinal hernia    Levoscoliosis    Motion sickness    boats, planes   Sleep apnea    No CPAP   Sleep difficulties    Spinal stenosis in cervical region    cord abutment C4/5   Testicular pain, left    Typical atrial flutter (HCC) 11/2015   a. CHADS2VASc => 0; b. on Eliquis  for pending ablation     Surgical History: Past Surgical History:  Procedure Laterality Date   ATRIAL FLUTTER ABLATION  02/17/2016   COLONOSCOPY  2014   COLONOSCOPY WITH PROPOFOL  N/A 06/30/2016   Procedure: COLONOSCOPY WITH PROPOFOL ;  Surgeon: Jinny Carmine, MD;  Location: [facility];  Service: Endoscopy;  Laterality: N/A;   COLONOSCOPY WITH PROPOFOL  N/A 07/07/2022   Procedure: COLONOSCOPY WITH PROPOFOLwith polyopectomy;  Surgeon: Jinny Carmine, MD;  Location: [facility];  Service: Endoscopy;  Laterality: N/A;   ELECTROPHYSIOLOGIC STUDY N/A 01/04/2016   Procedure: Cardioversion;  Surgeon: Deatrice DELENA Cage, MD;  Location: [facility];  Service: Cardiovascular;  Laterality: N/A;   ELECTROPHYSIOLOGIC STUDY N/A 02/17/2016   Procedure: A-Flutter Ablation;  Surgeon: Elspeth JAYSON Sage, MD;  Location: [facility];  Service: Cardiovascular;  Laterality: N/A;   ESOPHAGOGASTRODUODENOSCOPY (EGD) WITH PROPOFOL  N/A 07/07/2022   Procedure: ESOPHAGOGASTRODUODENOSCOPY (EGD) WITH PROPOFOL ;  Surgeon: Jinny Carmine, MD;  Location: [facility];   Service: Endoscopy;  Laterality: N/A;  balloon dilation 18mm   GANGLION CYST EXCISION Right 1994   wrist & back   HERNIA REPAIR Right 1994   abdominal repair with mesh   HERNIORRHAPHY, INGUINAL, ROBOT-ASSISTED, LAPAROSCOPIC Left 09/15/2023   Procedure: HERNIORRHAPHY, INGUINAL, ROBOT-ASSISTED, LAPAROSCOPIC;  Surgeon: Lane Shope, MD;  Location: [facility];  Service: General;  Laterality: Left;   INSERTION OF MESH Left 09/15/2023   Procedure: INSERTION OF MESH;  Surgeon: Lane Shope, MD;  Location: [facility];  Service: General;  Laterality: Left;   NASAL SEPTUM SURGERY  2011   Dr. Blair     Home Medications:  Allergies as of 11/09/2023       Reactions   Other Other (See Comments)   Pollen:  Sinus infection, cough, fatigue        Medication List        Accurate as of November 09, 2023  9:26 AM. If you have any questions, ask your nurse or doctor.          Advair  HFA 115-21 MCG/ACT inhaler Generic drug: fluticasone -salmeterol USE 2 INHALATIONS BY MOUTH TWICE DAILY What changed: See the new instructions.   albuterol  108 (90 Base) MCG/ACT inhaler Commonly known as: VENTOLIN  HFA Inhale 2 puffs into the lungs every 4 (four) hours as needed for wheezing or shortness of breath.   atorvastatin  40 MG tablet Commonly known as: LIPITOR Take 1 tablet (40 mg total) by mouth every morning.   finasteride  5 MG tablet Commonly known as: PROSCAR  Take 1 tablet (5 mg total) by mouth daily.   Gemtesa  75 MG Tabs Generic drug: Vibegron  Take 1 tablet (75 mg total) by mouth daily.   ibuprofen  800 MG tablet Commonly known as: ADVIL  Take 1 tablet (800 mg total) by mouth every 8 (eight) hours as needed.   losartan  50 MG tablet Commonly known as: COZAAR  Take 1 tablet (50 mg total) by mouth daily.   multivitamin with minerals Tabs tablet Take 1 tablet by mouth in the morning.   pantoprazole  40 MG tablet Commonly known as: PROTONIX  Take 1 tablet (40 mg total) by mouth daily.    senna 8.6 MG tablet Commonly known as: SENOKOT Take 2 tablets by mouth daily with supper.   tadalafil  5 MG tablet Commonly known as: CIALIS  TAKE 1 TABLET BY MOUTH DAILY   traZODone  50 MG tablet Commonly known as: DESYREL  Take 0.5-1 tablets (25-50 mg total) by mouth at bedtime as needed for sleep.   Trulance  3 MG Tabs Generic drug: Plecanatide  Take 1 tablet (3 mg total) by mouth daily as needed (constipation).        Allergies:  Allergies  Allergen Reactions   Other Other (See Comments)    Pollen:  Sinus infection, cough, fatigue    Family History: Family History  Problem Relation Age  of Onset   Hyperlipidemia Mother    Cancer Mother        skin cancer?   Heart disease Mother 30       MI - died in her sleep   Hyperlipidemia Father    Heart disease Father    Kidney disease Neg Hx    Prostate cancer Neg Hx    Kidney cancer Neg Hx    Bladder Cancer Neg Hx     Social History:  reports that he quit smoking about 32 years ago. His smoking use included cigarettes. He started smoking about 52 years ago. He has a 20 pack-year smoking history. He has been exposed to tobacco smoke. He has never used smokeless tobacco. He reports that he does not drink alcohol and does not use drugs.  ROS: For pertinent review of systems please refer to history of present illness  Physical Exam: BP (!) 142/81   Pulse 72   Ht 5' 10 (1.778 m)   Wt 200 lb (90.7 kg)   BMI 28.70 kg/m   Constitutional:  Well nourished. Alert and oriented, No acute distress. HEENT: [place] AT, moist mucus membranes.  Trachea midline Cardiovascular: No clubbing, cyanosis, or edema. Respiratory: Normal respiratory effort, no increased work of breathing. GI: Abdomen is soft, non tender, non distended, no abdominal masses. Well healed surgical incisions.  No hernias appreciated.  Stool sample for occult testing is not indicated.   GU: No CVA tenderness.  No bladder fullness or masses.  Patient with circumcised  phallus. Urethral meatus is patent.  No penile discharge. No penile lesions or rashes. Scrotum without lesions, cysts, rashes and/or edema.  Testicles are located scrotally bilaterally. No masses are appreciated in the testicles. Left and right epididymis are normal.  Left hydrocele is noted and tenderness with palpation is noted on exam of the spermatic cord.  Rectal: Patient with  normal sphincter tone. Anus and perineum without scarring or rashes. No rectal masses are appreciated. Prostate is approximately 45 grams, no nodules are appreciated. Seminal vesicles could not be palpated.  Neurologic: Grossly intact, no focal deficits, moving all 4 extremities. Psychiatric: Normal mood and affect.   Laboratory Data: See EPIC and HPI I have reviewed the labs.  Pertinent Imaging: Narrative & Impression  CLINICAL DATA:  Left scrotal swelling   EXAM: SCROTAL ULTRASOUND   DOPPLER ULTRASOUND OF THE TESTICLES   TECHNIQUE: Complete ultrasound examination of the testicles, epididymis, and other scrotal structures was performed. Color and spectral Doppler ultrasound were also utilized to evaluate blood flow to the testicles.   COMPARISON:  10/04/2022   FINDINGS: Right testicle   Measurements: 5 x 2.5 x 3 cm.  No mass or microlithiasis visualized.   Left testicle   Measurements: 4.3 x 2.7 x 2.7 cm. No mass or microlithiasis visualized.   Right epididymis:  Normal in size and appearance.   Left epididymis:  Normal in size and appearance.   Hydrocele:  Large left hydrocele, increasing since prior study.   Varicocele:  None visualized.   Pulsed Doppler interrogation of both testes demonstrates normal low resistance arterial and venous waveforms bilaterally.   IMPRESSION: Large left hydrocele, increasing since prior study.   No testicular abnormality.     Electronically Signed   By: Franky Crease M.D.   On: 10/29/2023 21:20    I have independently reviewed the films.  See HPI.      Assessment & Plan:    1. Left hydrocele -We discussed that the hydrocele may  be more bothersome at this time because it is a reactionary response to having surgery.  I reassured him that I do not see any signs of infection.  Advised him to wear supportive underwear in effort to prevent the scrotal sac from hanging which will aggravate the pain and cause the hydrocele to become larger.  He did ask about putting a needle into the hydrocele and draining the fluid out, I explained that this has is not typically performed anymore as the hydrocele recurrence rate with a needle is almost 100% and that you are introducing possible infection and trauma to the testicle and we reserve this for individuals who are not medically stable to undergo hydrocelectomy and have a large bothersome hydrocele -I will have him come back in 6 months for symptom recheck and a repeat exam of the hydrocele -advised patient to contact us  if the hydrocele becomes larger or more painful or if he experienced any drainage from the area, otherwise we will see him back in 6 months  2. Frequency - He is not yet taking the Gemtesa  samples, he will start them at some point prior to his 71-month appointment  3. BPH with LUTS -PSA up-to-date -Continue tadalafil  5 mg daily and finasteride  5 mg daily    These notes generated with voice recognition software. I apologize for typographical errors.  Return in about 6 months (around 05/09/2024) for symptom recheck and repeat exam .   Tyler CORNWALL, [name]  [facility] [address] [place], [place] [address] [phone]

## 2023-11-09 ENCOUNTER — Encounter: Payer: Self-pay | Admitting: Urology

## 2023-11-09 ENCOUNTER — Ambulatory Visit: Payer: Self-pay | Admitting: Urology

## 2023-11-09 VITALS — BP 142/81 | HR 72 | Ht 70.0 in | Wt 200.0 lb

## 2023-11-09 DIAGNOSIS — N401 Enlarged prostate with lower urinary tract symptoms: Secondary | ICD-10-CM | POA: Diagnosis not present

## 2023-11-09 DIAGNOSIS — N432 Other hydrocele: Secondary | ICD-10-CM

## 2023-11-09 DIAGNOSIS — N138 Other obstructive and reflux uropathy: Secondary | ICD-10-CM

## 2023-11-09 DIAGNOSIS — R972 Elevated prostate specific antigen [PSA]: Secondary | ICD-10-CM | POA: Diagnosis not present

## 2023-11-09 DIAGNOSIS — R350 Frequency of micturition: Secondary | ICD-10-CM

## 2023-11-09 MED ORDER — FINASTERIDE 5 MG PO TABS: 5.0000 mg | ORAL_TABLET | Freq: Every day | ORAL | 3 refills | Status: AC

## 2023-11-14 DIAGNOSIS — M5416 Radiculopathy, lumbar region: Secondary | ICD-10-CM | POA: Diagnosis not present

## 2023-11-14 DIAGNOSIS — M5412 Radiculopathy, cervical region: Secondary | ICD-10-CM | POA: Diagnosis not present

## 2023-11-14 DIAGNOSIS — M5489 Other dorsalgia: Secondary | ICD-10-CM | POA: Diagnosis not present

## 2023-11-14 DIAGNOSIS — M549 Dorsalgia, unspecified: Secondary | ICD-10-CM | POA: Diagnosis not present

## 2023-11-14 DIAGNOSIS — M5380 Other specified dorsopathies, site unspecified: Secondary | ICD-10-CM | POA: Diagnosis not present

## 2023-11-14 NOTE — Progress Notes (Signed)
 HPI The patient is a pleasant 68 year-old gentleman who presents today for follow-up of neck and back pain. He reports his back pain has been ongoing since 2010 without injury.  He describes bilateral low back pain with radiation to the left buttock, left lateral thigh and lateral calf extending into his fifth digit.  He describes moderate severe pain that is sharp, throbbing and aching.  His pain is constant with associated numbness, tingling and weakness.  His pain has progressively increased in the last several years and worsened with walking, standing, twisting and sitting.  Rest can help to alleviate his pain.  He has participated in physical therapy for his low back without improvement. Left lower extremity EMG performed by Dr. 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  MRI lumbar spine reveals multilevel facet arthropathy without stenosis.  Medications include Flexeril  10 mg and meloxicam  15 mg both provide moderate relief of pain.  He was evaluated by Alan on 10/12/2017 at which time it was recommended that he repeat his cervical MRI and obtain an lumbar MRI.  An EMG of his left upper and lower extremity have also been performed. Left upper extremity EMG performed by Dr. Lane on 12/06/2017 was without findings.  MRI cervical spine revealed mild left C6.  He also describes pain to his left knee for which she has received a left knee joint injection with mild benefit.  Reports he broke his tailbone in 1998 and he continues to have occasional pain at this region.  He has not worked since 2008. He is married and has a 64 year old daughter.  [place] pool however he states that this aggravates his neck pain and is unable to walk or swim.  He was last evaluated by [facility] pain clinic on 03/25/2021 at which time he described increase in pain following cervical facet blocks performed on 03/11/2021.  He was last evaluated on 11/30/2021 at which  time he was experiencing improvement with prednisone  and massage.  He was to continue Flexeril  meloxicam  and follow-up as needed.  At today's visit he states he has pain in his mid back and low back left greater than right with extension to the left leg posterior thigh and calf. He states his pain is moderate to severe. He states he has been doing home exercises and not taking any medications. Denies any injury or loss of control of bowels or bladder.  He states that after palpation on exam he has diffuse aching pain and he wishes to try a prednisone  taper.  The [place]  Narcotic Database was reviewed today.  Procedures: 03/25/2021: Cervical facet blocks by Dr. Lavanda no relief 02/09/2021: Left C7-T1 epidural steroid injection performed by Dr. Nivera 09/17/2019: Left S1 transforaminal ESI (moderate relief)  03/15/2019: Left C5-6 and left C6-7 facet joint injections (3 days of increased pain and then continued left-sided neck pain) 09/06/2018: Left S1 transforaminal ESI (moderate relief)  07/27/2018: Left S1 transforaminal ESI (moderate to good relief) 07/06/2018: Left L5-S1 transforaminal ESI (minimal relief) 03/14/2018: Left L4-5 and L5-S1 facet joint injections (2 weeks of increased pain and then mild relief of left-sided low back pain)  Past Medical History:  Diagnosis Date  . Allergic state   . Backache, unspecified   . Benign paroxysmal positional vertigo   . Diverticulosis   . Hyperlipidemia   . Scoliosis     Past Surgical History:  Procedure Laterality Date  . CARDIOVASCULAR STRESS TEST  04/29/11  Chemical Stress Test -> No ischemia.  . INGUINAL HERNIA REPAIR Right   . NASAL SEPTUM SURGERY N/A    In 2007    Social History   Socioeconomic History  . Marital status: Married  Occupational History  . Occupation: disabled  Tobacco Use  . Smoking status: Former    Types: Cigarettes  . Smokeless tobacco: Never  . Tobacco comments:    stopped 02/1991 stopped smoking   Vaping Use  . Vaping status: Never Used  Substance and Sexual Activity  . Alcohol use: No  . Drug use: No   Social Drivers of Corporate investment banker Strain: Low Risk  (07/07/2021)   Received from [facility]   Overall Financial Resource Strain (CARDIA)   . Difficulty of Paying Living Expenses: Not hard at all  Food Insecurity: No Food Insecurity (07/07/2021)   Received from [facility]   Hunger Vital Sign   . Within the past 12 months, you worried that your food would run out before you got the money to buy more.: Never true   . Within the past 12 months, the food you bought just didn't last and you didn't have money to get more.: Never true  Transportation Needs: No Transportation Needs (07/07/2021)   Received from [facility] - Transportation   . Lack of Transportation (Medical): No   . Lack of Transportation (Non-Medical): No    Current Outpatient Medications on File Prior to Visit  Medication Sig Dispense Refill  . albuterol  MDI, PROVENTIL , VENTOLIN , PROAIR , HFA 90 mcg/actuation inhaler Inhale 2 Inhalations into the lungs every 4 (four) hours as needed    . atorvastatin  (LIPITOR) 10 MG tablet Take 1 tablet (10 mg total) by mouth daily. 90 tablet 3  . escitalopram  oxalate (LEXAPRO ) 10 MG tablet Take 1 tablet by mouth once daily    . finasteride  (PROSCAR ) 5 mg tablet Take 1 tablet by mouth once daily    . losartan  (COZAAR ) 50 MG tablet Take 1 tablet by mouth once daily    . multivitamin with minerals tablet Take 1 tablet by mouth every morning    . pantoprazole  (PROTONIX ) 40 MG DR tablet Take 40 mg by mouth once daily.    . sennosides (SENOKOT) 8.6 mg tablet Take 2 tablets by mouth    . tadalafil  (CIALIS ) 10 MG tablet Take 1 tablet by mouth once daily    . traZODone  (DESYREL ) 50 MG tablet Take 25-50 mg by mouth at bedtime as needed    . TRULANCE  3 mg tablet Take 3 mg by mouth     No current facility-administered medications on file prior to visit.    Allergies  as of 11/14/2023 - Reviewed 11/14/2023  Allergen Reaction Noted  . Other Other (See Comments) 06/16/2011    ROS More than 10 system, review of system form was given to the patient to fill out and has been signed by [employer] FNP-C and scanned into the patient's chart.   Vital signs Vitals:   11/14/23 0835  BP: 126/74  Pulse: 53  Temp: 36.5 C (97.7 F)  TempSrc: Oral  Weight: 91.4 kg (201 lb 8 oz)  Height: 177.8 cm (5' 10)  PainSc:   6  PainLoc: Back     Exam General: Alert oriented well-nourished no distress  Cervical Exam (performed 11/14/2023) Upon inspection there are no rashes or scars.  He has mild tenderness to palpation to the bilateral cervical paraspinal musculature.  Mild to moderate decrease in  range of motion bilaterally.  Spurling's maneuver is negative bilaterally.  Upper Extremity Exam He has 5/5 strength in bilateral wrist extensors, biceps, triceps and deltoids, shoulder internal and external rotators.  Sensation intact to light touch bilaterally.  Empty can test negative bilaterally.  Negative Hoffmann's bilaterally.  He has +2 bilateral bicep, tricep and brachioradialis reflexes bilaterally.  Lumbosacral Exam (performed 11/14/2023) Upon inspection rashes or scars.  He has mild tenderness with palpation to the bilateral lumbosacral paraspinal musculature.  He has mild mid back pain around T10.  Lower Extremity Exam He has 5/5 strength in bilateral dorsiflexors, knee extensors, hip flexors.  Sensation intact to light touch with mild referred tingling pain to his left posterior thigh to light touch.  Straight leg raise produces mild low back pain.  Full range of motion to bilateral hip joints without pain elicited.  He has +2 bilateral patellar and Achilles reflexes no ankle clonus.  Radiographic Data Thoracic spine xray from [facility] clinic dated 11/14/2023 imaging reviewed awaiting official radiology report.  Impression 1.  Chronic mid back pain.  Clinically his symptoms are most consistent with muscle spasm and ddd.   2.  Acute right lateral neck pain with radiation to the right parascapular region and left shoulder.  Clinically symptoms was consistent with cervical radiculitis and muscle spasm. EXAM:  MRI CERVICAL SPINE WITHOUT CONTRAST  TECHNIQUE:  Multiplanar, multisequence MR imaging of the cervical spine was  performed. No intravenous contrast was administered.  COMPARISON:  Radiographs of the cervical spine 01/04/2021. MRI of  the cervical spine 11/18/2017.  FINDINGS:  Alignment: Trace C4-C5 and C5-C6 grade 1 retrolisthesis.  Vertebrae: Redemonstrated mild chronic anterior wedging of the T1  and T2 vertebral bodies. Cervical vertebral body height is  maintained. Marrow edema within the right C1 lateral mass (for  instance as seen on series 7, image 3). Elsewhere, no significant  marrow edema or focal suspicious osseous lesion is identified. Small  hemangioma within the left T1 articular pillar and pedicle.  Cord: No signal abnormality identified within the cervical spinal  cord.  Posterior Fossa, vertebral arteries, paraspinal tissues: No  abnormality identified within included portions of the posterior  fossa. Flow voids preserved within the imaged cervical vertebral  arteries. Paraspinal soft tissues unremarkable.  Disc levels:  Unless otherwise stated, the level by level findings below have not  significantly changed from the prior MRI of 11/18/2017.  Moderate disc degeneration at C4-C5 and C5-C6. No more than mild  disc degeneration at the remaining levels.  C2-C3: Central posterior annular fissure. No significant disc  herniation or stenosis.  C3-C4: Slight disc bulge. Mild facet arthrosis. No significant  spinal canal or foraminal stenosis.  C4-C5: Trace grade 1 retrolisthesis. Posterior disc osteophyte  complex with bilateral disc osteophyte ridge/uncinate hypertrophy.  Mild facet hypertrophy. As before, the  posterior disc osteophyte  complex focally effaces the ventral thecal sac, contacting and  minimally flattening the right ventral aspect of the spinal cord  (series 8, image 12). However, the dorsal CSF space is maintained  within the spinal canal. Mild bilateral neural foraminal narrowing  (greater on the left).  C5-C6: Trace grade 1 retrolisthesis. Disc bulge. Left greater than  right disc osteophyte ridge/uncinate hypertrophy. Mild facet  arthrosis. No significant spinal canal stenosis. Bilateral neural  foraminal narrowing (moderate right, severe left).  C6-C7: Small central disc protrusion eccentric to the left. Facet  arthrosis (greater on the left). The disc protrusion results in mild  focal effacement of ventral thecal sac (  without spinal cord mass  effect). No significant foraminal stenosis.  C7-T1: No significant disc herniation or stenosis.  IMPRESSION:  Cervical spondylosis, as outlined and not significantly changed from  the prior MRI of 11/18/2017. Findings are most notably as follows.  At C4-C5, there is moderate disc degeneration. A posterior disc  osteophyte complex contacts and mildly flattens the right ventral  aspect of the spinal cord. However, the dorsal CSF space is  maintained within the spinal canal. Mild bilateral neural foraminal  narrowing (greater on the left).  At C5-C6, there is moderate disc degeneration. Multifactorial  bilateral neural foraminal narrowing (moderate right, severe left).  No more than mild relative spinal canal narrowing at the remaining  levels.  Mild edema within the right C1 lateral mass, likely degenerative.  Trace grade 1 retrolisthesis at C4-C5 and C5-C6.  Redemonstrated mild chronic anterior wedging of the T1 and T2  vertebral bodies.  Electronically Signed    By: Rockey Childs D.O.    On: 02/17/2021 09:22   3.  Chronic bilateral low back pain with occasional radiation to the left buttock and left lateral thigh and calf.   Clinically his symptoms are most consistent with facet mediated pain along with a possible element of L5 radiculitis. MRI lumbar spine without contrast from [facility] dated 11/18/2017 imaging and report reviewed today.  L5-S1 mild to moderate bilateral facet arthropathy.  No stenosis.  L4-5 moderate bilateral facet arthropathy.  No stenosis.  L3-4 mild disc desiccation with mild disc bulging.  Moderate bilateral facet arthropathy.  No stenosis.  L2-3 mild bilateral facet arthropathy without stenosis.  L1 -2 is unremarkable. EMG performed by Dr. 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  4.  Chronic bilateral lateral cervical spine pain left greater than right with associated headaches of the occipital region.  Clinically symptoms are most consistent with facet mediated pain.  MRI cervical spine without contrast from [facility] dated 11/18/2017 imaging and report viewed today.  C2-3 is unremarkable.  C3-4 unremarkable.  C4-5 mild to space narrowing with mild left-sided foraminal stenosis.  Mild central stenosis.  C5-6 mild to space narrowing with annular bulge and moderate left foraminal stenosis.  No central stenosis.  Mild bilateral facet arthropathy.  C6-7 is unremarkable.  C7-T1 is unremarkable.  5.  Chronic numbness and tingling to the left triceps, ulnar aspect of the forearm and digits 3 through 5.  He describes neck pain is greater than upper extremity pain.  Clinically his symptoms are most consistent with a cervical radiculitis particularly C8.  6.  Hypercholesterolemia 7.  Fatigue, chronic pain  Plan 1.  Continue with Flexeril  10 mg 3 times daily as needed #30, 3 refills sent to pharmacy. 2.  Continue with Mobic  15 mg daily as needed 3.  He prefers to avoid taking gabapentin  at this time 4.  Referral placed for physical [facility] at [facility] 5.  He is going to order a heated massage device. 6.  He is going to continue with massage therapy. 7.  He wishes  to try prednisone  10mg  6 day taper I have sent this to the pharmacy. 8.  He will follow up with our office in 6 weeks   I personally performed the service, non-incident to.  (WP)    WHITNEY MEELER, NP  This note was generated in part with voice recognition software and I apologize for any typographical errors that were not detected and corrected.  PCP: Rollene Northern, NP

## 2023-11-16 ENCOUNTER — Ambulatory Visit: Admitting: Nurse Practitioner

## 2023-11-17 ENCOUNTER — Emergency Department: Admission: EM | Admit: 2023-11-17 | Discharge: 2023-11-18 | Disposition: A | Discharge status: Home / Self Care

## 2023-11-17 ENCOUNTER — Other Ambulatory Visit: Payer: Self-pay

## 2023-11-17 ENCOUNTER — Emergency Department

## 2023-11-17 ENCOUNTER — Ambulatory Visit: Admitting: Nurse Practitioner

## 2023-11-17 DIAGNOSIS — K5732 Diverticulitis of large intestine without perforation or abscess without bleeding: Secondary | ICD-10-CM | POA: Diagnosis not present

## 2023-11-17 DIAGNOSIS — Z8719 Personal history of other diseases of the digestive system: Secondary | ICD-10-CM | POA: Diagnosis not present

## 2023-11-17 DIAGNOSIS — R109 Unspecified abdominal pain: Secondary | ICD-10-CM

## 2023-11-17 DIAGNOSIS — I700 Atherosclerosis of aorta: Secondary | ICD-10-CM | POA: Diagnosis not present

## 2023-11-17 DIAGNOSIS — R1084 Generalized abdominal pain: Secondary | ICD-10-CM | POA: Diagnosis present

## 2023-11-17 DIAGNOSIS — K5792 Diverticulitis of intestine, part unspecified, without perforation or abscess without bleeding: Secondary | ICD-10-CM

## 2023-11-17 DIAGNOSIS — D72829 Elevated white blood cell count, unspecified: Secondary | ICD-10-CM | POA: Insufficient documentation

## 2023-11-17 DIAGNOSIS — R509 Fever, unspecified: Secondary | ICD-10-CM | POA: Diagnosis not present

## 2023-11-17 DIAGNOSIS — R1030 Lower abdominal pain, unspecified: Secondary | ICD-10-CM | POA: Diagnosis not present

## 2023-11-17 DIAGNOSIS — K5730 Diverticulosis of large intestine without perforation or abscess without bleeding: Secondary | ICD-10-CM | POA: Diagnosis not present

## 2023-11-17 LAB — COMPREHENSIVE METABOLIC PANEL WITH GFR
ALT: 18 U/L (ref 0–44)
AST: 21 U/L (ref 15–41)
Albumin: 4.1 g/dL (ref 3.5–5.0)
Alkaline Phosphatase: 58 U/L (ref 38–126)
Anion gap: 12 (ref 5–15)
BUN: 17 mg/dL (ref 8–23)
CO2: 26 mmol/L (ref 22–32)
Calcium: 9.3 mg/dL (ref 8.9–10.3)
Chloride: 99 mmol/L (ref 98–111)
Creatinine, Ser: 0.85 mg/dL (ref 0.61–1.24)
GFR, Estimated: >60 mL/min (ref >60)
Glucose, Bld: 123 mg/dL — ABNORMAL HIGH (ref 70–99)
Potassium: 3.7 mmol/L (ref 3.5–5.1)
Sodium: 137 mmol/L (ref 135–145)
Total Bilirubin: 2.2 mg/dL — ABNORMAL HIGH (ref 0.0–1.2)
Total Protein: 7.3 g/dL (ref 6.5–8.1)

## 2023-11-17 LAB — URINALYSIS, ROUTINE W REFLEX MICROSCOPIC
Bilirubin Urine: NEGATIVE
Glucose, UA: NEGATIVE mg/dL
Hgb urine dipstick: NEGATIVE
Ketones, ur: NEGATIVE mg/dL
Leukocytes,Ua: NEGATIVE
Nitrite: NEGATIVE
Protein, ur: NEGATIVE mg/dL
Specific Gravity, Urine: 1.013 (ref 1.005–1.030)
pH: 5 (ref 5.0–8.0)

## 2023-11-17 LAB — CBC
HCT: 43.9 % (ref 39.0–52.0)
Hemoglobin: 14.7 g/dL (ref 13.0–17.0)
MCH: 29.7 pg (ref 26.0–34.0)
MCHC: 33.5 g/dL (ref 30.0–36.0)
MCV: 88.7 fL (ref 80.0–100.0)
Platelets: 203 K/uL (ref 150–400)
RBC: 4.95 MIL/uL (ref 4.22–5.81)
RDW: 13.7 % (ref 11.5–15.5)
WBC: 11.7 K/uL — ABNORMAL HIGH (ref 4.0–10.5)
nRBC: 0 % (ref 0.0–0.2)

## 2023-11-17 LAB — LIPASE, BLOOD: Lipase: 27 U/L (ref 11–51)

## 2023-11-17 MED ORDER — ONDANSETRON HCL 4 MG/2ML IJ SOLN
4.0000 mg | Freq: Once | INTRAMUSCULAR | Status: AC
Start: 2023-11-17 — End: 2023-11-17
  Administered 2023-11-17: 4 mg via INTRAVENOUS
  Filled 2023-11-17: qty 2

## 2023-11-17 MED ORDER — IOHEXOL 300 MG/ML  SOLN
100.0000 mL | Freq: Once | INTRAMUSCULAR | Status: AC | PRN
  Administered 2023-11-17: 100 mL via INTRAVENOUS

## 2023-11-17 MED ORDER — MORPHINE SULFATE (PF) 4 MG/ML IV SOLN
4.0000 mg | Freq: Once | INTRAVENOUS | Status: AC
  Administered 2023-11-17: 4 mg via INTRAVENOUS
  Filled 2023-11-17: qty 1

## 2023-11-17 MED ORDER — METRONIDAZOLE 500 MG/100ML IV SOLN
500.0000 mg | Freq: Once | INTRAVENOUS | Status: AC
  Administered 2023-11-17: 500 mg via INTRAVENOUS
  Filled 2023-11-17: qty 100

## 2023-11-17 MED ORDER — CEFTRIAXONE SODIUM 1 G IJ SOLR
1.0000 g | Freq: Once | INTRAMUSCULAR | Status: AC
  Administered 2023-11-17: 1 g via INTRAVENOUS
  Filled 2023-11-17: qty 10

## 2023-11-17 MED ORDER — CIPROFLOXACIN HCL 500 MG PO TABS: 500.0000 mg | ORAL_TABLET | Freq: Two times a day (BID) | ORAL | 0 refills | Status: AC

## 2023-11-17 MED ORDER — POLYETHYLENE GLYCOL 3350 17 G PO PACK: 17.0000 g | PACK | Freq: Every day | ORAL | 0 refills | Status: AC

## 2023-11-17 MED ORDER — OXYCODONE HCL 5 MG PO TABS: 5.0000 mg | ORAL_TABLET | ORAL | 0 refills | Status: DC | PRN

## 2023-11-17 MED ORDER — OXYCODONE HCL 5 MG PO TABS
10.0000 mg | ORAL_TABLET | Freq: Once | ORAL | Status: AC
  Administered 2023-11-17: 10 mg via ORAL
  Filled 2023-11-17: qty 2

## 2023-11-17 MED ORDER — METRONIDAZOLE 500 MG PO TABS: 500.0000 mg | ORAL_TABLET | Freq: Four times a day (QID) | ORAL | 0 refills | Status: AC

## 2023-11-17 MED ORDER — SODIUM CHLORIDE 0.9 % IV BOLUS
1000.0000 mL | Freq: Once | INTRAVENOUS | Status: AC
  Administered 2023-11-17: 1000 mL via INTRAVENOUS

## 2023-11-17 NOTE — ED Provider Notes (Signed)
 [facility] Provider Note    Event Date/Time   First MD Initiated Contact with Patient 11/17/23 2010     (approximate)   History   Abdominal Pain   HPI  [name]. is a 68 y.o. male with a prior history of diverticulosis with diverticulitis, hernia repair who presents to the emergency department with 48 hours of generalized abdominal pain and constipation.  Patient states that pain is primarily located in his lower abdomen is constant and crampy.  He feels as if he is constipated.  He denies any nausea or vomiting.  He denies any fevers.  He states that his symptoms are consistent with prior episodes of diverticulitis with his last 1 being 3 months ago.  He did have a colonoscopy 2 years ago which he reports is abnormal.  Denies any chest pain shortness of breath changes in urinary habits.      Physical Exam   Triage Vital Signs: ED Triage Vitals [11/17/23 1808]  Encounter Vitals Group     BP (!) 164/88     Girls Systolic BP Percentile      Girls Diastolic BP Percentile      Boys Systolic BP Percentile      Boys Diastolic BP Percentile      Pulse Rate 95     Resp 18     Temp 98.4 F (36.9 C)     Temp Source Oral     SpO2 98 %     Weight      Height      Head Circumference      Peak Flow      Pain Score 10     Pain Loc      Pain Education      Exclude from Growth Chart     Most recent vital signs: Vitals:   11/17/23 2330 11/17/23 2356  BP: 128/72 128/71  Pulse: 65 69  Resp:  20  Temp:  99.4 F (37.4 C)  SpO2: 94% 100%    Nursing Triage Note reviewed. Vital signs reviewed and patients oxygen saturation is normoxic  General: Patient is well nourished, well developed, awake and alert, resting comfortably in no acute distress Head: Normocephalic and atraumatic Eyes: Normal inspection, extraocular muscles intact, no conjunctival pallor Ear, nose, throat: Normal external exam Neck: Normal range of motion Respiratory: Patient is  in no respiratory distress, lungs CTAB Cardiovascular: Patient is not tachycardic, RRR without murmur appreciated GI: Abdomen soft, tender to palpation generally but more so in the left lower quadrant no rebound or guarding Back: Normal inspection of the back with good strength and range of motion throughout all ext Extremities: pulses intact with good cap refills, no LE pitting edema or calf tenderness Neuro: The patient is alert and oriented to person, place, and time, appropriately conversive, with 5/5 bilat UE/LE strength, no gross motor or sensory defects noted. Coordination appears to be adequate. Skin: Warm, dry, and intact Psych: normal mood and affect, no SI or HI  ED Results / Procedures / Treatments   Labs (all labs ordered are listed, but only abnormal results are displayed) Labs Reviewed  COMPREHENSIVE METABOLIC PANEL WITH GFR - Abnormal; Notable for the following components:      Result Value   Glucose, Bld 123 (*)    Total Bilirubin 2.2 (*)    All other components within normal limits  CBC - Abnormal; Notable for the following components:   WBC 11.7 (*)  All other components within normal limits  URINALYSIS, ROUTINE W REFLEX MICROSCOPIC - Abnormal; Notable for the following components:   Color, Urine YELLOW (*)    APPearance CLEAR (*)    All other components within normal limits  LIPASE, BLOOD     EKG None  RADIOLOGY CT abd and pelvis with iv contrast: No bowel perforation or abscess on my independent review interpretation radiologist reads this is consistent with acute diverticulitis without complication    PROCEDURES:  Critical Care performed: No  Procedures   MEDICATIONS ORDERED IN ED: Medications  sodium chloride  0.9 % bolus 1,000 mL (0 mLs Intravenous Stopped 11/17/23 2239)  morphine (PF) 4 MG/ML injection 4 mg (4 mg Intravenous Given 11/17/23 2044)  ondansetron  (ZOFRAN ) injection 4 mg (4 mg Intravenous Given 11/17/23 2044)  iohexol  (OMNIPAQUE )  300 MG/ML solution 100 mL (100 mLs Intravenous Contrast Given 11/17/23 2110)  metroNIDAZOLE  (FLAGYL ) IVPB 500 mg (0 mg Intravenous Stopped 11/17/23 2355)  cefTRIAXone (ROCEPHIN) 1 g in sodium chloride  0.9 % 100 mL IVPB (0 g Intravenous Stopped 11/17/23 2239)  oxyCODONE  (Oxy IR/ROXICODONE ) immediate release tablet 10 mg (10 mg Oral Given 11/17/23 2155)     IMPRESSION / MDM / ASSESSMENT AND PLAN / ED COURSE                                Differential diagnosis includes, but is not limited to, recurrent diverticulitis, colitis, appendicitis, constipation, electrolyte derangement anemia  ED course: Patient presents with 48 hours of abdominal pain.  On presentation abdomen demonstrates no evidence of peritonitis.  Urine he has a very mild leukocytosis of 11.7 and no acute electrolyte derangements, renal insufficiency and UA is not consistent with UTI.  CT abdomen pelvis with IV contrast was consistent with diverticulitis and he was given IV fluids morphine Zofran  and ceftriaxone and Flagyl .  Patient was offered admission today for pain control and continued IV antibiotics however he states that he would prefer home which I think is reasonable.  He was able to tolerate p.o. with a dose of oxycodone .  Given his constipation I have initiated MiraLAX as well.  Will send ciprofloxacin  and Flagyl  to his pharmacy of choice along with MiraLAX and oxycodone .  He already has a primary care physician appointment scheduled for next week but he he was counseled on return precautions.  All questions answered patient voiced understanding and requested discharge   Clinical Course as of 11/18/23 0008  Fri Nov 17, 2023  2014 WBC(!): 11.7 Mild leukocytosis [HD]  2019 Urinalysis, Routine w reflex microscopic -Urine, Clean Catch(!) Not consistent with UTI [HD]  2236 Patient offered admission but would prefer to return home.  I reviewed the indications benefits risk and alternatives of initiating ciprofloxacin  including  blackbox warnings and Flagyl  with the patient and he voiced understanding and opted to proceed especially since he has tolerated ciprofloxacin  well in the past.  He already has a PCP appointment scheduled for next week.  Will send him with oxycodone  as well.  Return precautions discussed and patient voiced understanding and requested discharge [HD]    Clinical Course User Index [HD] Nicholaus Rolland BRAVO, MD   At time of discharge there is no evidence of acute life, limb, vision, or fertility threat. Patient has stable vital signs, pain is well controlled, patient is ambulatory and p.o. tolerant.  Discharge instructions were completed using the EPIC system. I would refer you to those at this time.  All warnings prescriptions follow-up etc. were discussed in detail with the patient. Patient indicates understanding and is agreeable with this plan. All questions answered.  Patient is made aware that they may return to the emergency department for any worsening or new condition or for any other emergency.   -- Risk: 5 This patient has a high risk of morbidity due to further diagnostic testing or treatment. Rationale: This patient's evaluation and management involve a high risk of morbidity due to the potential severity of presenting symptoms, need for diagnostic testing, and/or initiation of treatment that may require close monitoring. The differential includes conditions with potential for significant deterioration or requiring escalation of care. Treatment decisions in the ED, including medication administration, procedural interventions, or disposition planning, reflect this level of risk. COPA: 5 The patient has the following acute or chronic illness/injury that poses a possible threat to life or bodily function: [X] : The patient has a potentially serious acute condition or an acute exacerbation of a chronic illness requiring urgent evaluation and management in the Emergency Department. The clinical  presentation necessitates immediate consideration of life-threatening or function-threatening diagnoses, even if they are ultimately ruled out.   FINAL CLINICAL IMPRESSION(S) / ED DIAGNOSES   Final diagnoses:  Diverticulitis  Abdominal pain, unspecified abdominal location     Rx / DC Orders   ED Discharge Orders          Ordered    ciprofloxacin  (CIPRO ) 500 MG tablet  2 times daily        11/17/23 2241    metroNIDAZOLE  (FLAGYL ) 500 MG tablet  4 times daily        11/17/23 2241    oxyCODONE  (OXY IR/ROXICODONE ) 5 MG immediate release tablet  Every 4 hours PRN        11/17/23 2241    polyethylene glycol (MIRALAX) 17 g packet  Daily        11/17/23 2241             Note:  This document was prepared using Dragon voice recognition software and may include unintentional dictation errors.   Nicholaus Rolland BRAVO, MD 11/18/23 [phone]

## 2023-11-17 NOTE — ED Triage Notes (Signed)
 Pt to ED via POV from home. Pt reports lower abd pain that started yesterday and has gotten worse. Pt with hx of diverticulitis. Denies N/V/D

## 2023-11-17 NOTE — Discharge Instructions (Addendum)
 Your next dose of antibiotics will be due tomorrow morning.  Please follow-up with your primary care physician as soon as possible.  Please return with any acutely worsening symptoms or any other emergency. -- RETURN PRECAUTIONS & AFTERCARE: (ENGLISH) RETURN PRECAUTIONS: Return immediately to the emergency department or see/call your doctor if you feel worse, weak or have changes in speech or vision, are short of breath, have fever, vomiting, pain, bleeding or dark stool, trouble urinating or any new issues. Return here or see/call your doctor if not improving as expected for your suspected condition. FOLLOW-UP CARE: Call your doctor and/or any doctors we referred you to for more advice and to make an appointment. Do this today, tomorrow or after the weekend. Some doctors only take PPO insurance so if you have HMO insurance you may want to contact your HMO or your regular doctor for referral to a specialist within your plan. Either way tell the doctor's office that it was a referral from the emergency department so you get the soonest possible appointment.  YOUR TEST RESULTS: Take result reports of any blood or urine tests, imaging tests and EKG's to your doctor and any referral doctor. Have any abnormal tests repeated. Your doctor or a referral doctor can let you know when this should be done. Also make sure your doctor contacts this hospital to get any test results that are not currently available such as cultures or special tests for infection and final imaging reports, which are often not available at the time you leave the ER but which may list additional important findings that are not documented on the preliminary report. BLOOD PRESSURE: If your blood pressure was greater than 120/80 have your blood pressure rechecked within 1 to 2 weeks. MEDICATION SIDE EFFECTS: Do not drive, walk, bike, take the bus, etc. if you have received or are being prescribed any sedating medications such as those for pain or  anxiety or certain antihistamines like Benadryl . If you have been give one of these here get a taxi home or have a friend drive you home. Ask your pharmacist to counsel you on potential side effects of any new medication

## 2023-11-18 NOTE — ED Provider Notes (Incomplete)
 [facility] Provider Note    Event Date/Time   First MD Initiated Contact with Patient 11/17/23 2010     (approximate)   History   Abdominal Pain   HPI  [name]. is a 68 y.o. male with a prior history of diverticulosis with diverticulitis, hernia repair who presents to the emergency department      Physical Exam   Triage Vital Signs: ED Triage Vitals [11/17/23 1808]  Encounter Vitals Group     BP (!) 164/88     Girls Systolic BP Percentile      Girls Diastolic BP Percentile      Boys Systolic BP Percentile      Boys Diastolic BP Percentile      Pulse Rate 95     Resp 18     Temp 98.4 F (36.9 C)     Temp Source Oral     SpO2 98 %     Weight      Height      Head Circumference      Peak Flow      Pain Score 10     Pain Loc      Pain Education      Exclude from Growth Chart     Most recent vital signs: Vitals:   11/17/23 1808  BP: (!) 164/88  Pulse: 95  Resp: 18  Temp: 98.4 F (36.9 C)  SpO2: 98%    Nursing Triage Note reviewed. Vital signs reviewed and patients oxygen saturation is normoxic***  General: Patient is well nourished, well developed, awake and alert, resting comfortably in no acute distress Head: Normocephalic and atraumatic Eyes: Normal inspection, extraocular muscles intact, no conjunctival pallor Ear, nose, throat: Normal external exam Neck: Normal range of motion Respiratory: Patient is in no respiratory distress, lungs CTAB Cardiovascular: Patient is not tachycardic, RRR without murmur appreciated GI: Abd SNT with no guarding or rebound  Back: Normal inspection of the back with good strength and range of motion throughout all ext Extremities: pulses intact with good cap refills, no LE pitting edema or calf tenderness Neuro: The patient is alert and oriented to person, place, and time, appropriately conversive, with 5/5 bilat UE/LE strength, no gross motor or sensory defects noted. Coordination  appears to be adequate. Skin: Warm, dry, and intact Psych: normal mood and affect, no SI or HI  ED Results / Procedures / Treatments   Labs (all labs ordered are listed, but only abnormal results are displayed) Labs Reviewed  COMPREHENSIVE METABOLIC PANEL WITH GFR - Abnormal; Notable for the following components:      Result Value   Glucose, Bld 123 (*)    Total Bilirubin 2.2 (*)    All other components within normal limits  CBC - Abnormal; Notable for the following components:   WBC 11.7 (*)    All other components within normal limits  LIPASE, BLOOD  URINALYSIS, ROUTINE W REFLEX MICROSCOPIC     EKG   RADIOLOGY ***    PROCEDURES:  Critical Care performed: {CriticalCareYesNo:19197::Yes, see critical care procedure note(s),No}  Procedures   MEDICATIONS ORDERED IN ED: Medications - No data to display   IMPRESSION / MDM / ASSESSMENT AND PLAN / ED COURSE                                Differential diagnosis includes, but is not limited to, ***    ***  Clinical Course as of 11/18/23 0003  Fri Nov 17, 2023  2014 WBC(!): 11.7 Mild leukocytosis [HD]  2019 Urinalysis, Routine w reflex microscopic -Urine, Clean Catch(!) Not consistent with UTI [HD]  2236 Patient offered admission but would prefer to return home.  I reviewed the indications benefits risk and alternatives of initiating ciprofloxacin  including blackbox warnings and Flagyl  with the patient and he voiced understanding and opted to proceed especially since he has tolerated ciprofloxacin  well in the past.  He already has a PCP appointment scheduled for next week.  Will send him with oxycodone  as well.  Return precautions discussed and patient voiced understanding and requested discharge [HD]    Clinical Course User Index [HD] Nicholaus Rolland BRAVO, MD   -- Risk: 5 This patient has a high risk of morbidity due to further diagnostic testing or treatment. Rationale: This patient's evaluation and  management involve a high risk of morbidity due to the potential severity of presenting symptoms, need for diagnostic testing, and/or initiation of treatment that may require close monitoring. The differential includes conditions with potential for significant deterioration or requiring escalation of care. Treatment decisions in the ED, including medication administration, procedural interventions, or disposition planning, reflect this level of risk. COPA: 5 The patient has the following acute or chronic illness/injury that poses a possible threat to life or bodily function: [X] : The patient has a potentially serious acute condition or an acute exacerbation of a chronic illness requiring urgent evaluation and management in the Emergency Department. The clinical presentation necessitates immediate consideration of life-threatening or function-threatening diagnoses, even if they are ultimately ruled out.   FINAL CLINICAL IMPRESSION(S) / ED DIAGNOSES   Final diagnoses:  None     Rx / DC Orders   ED Discharge Orders     None        Note:  This document was prepared using Dragon voice recognition software and may include unintentional dictation errors.

## 2023-11-22 NOTE — Progress Notes (Unsigned)
 [facility] POST-OP OFFICE VISIT  11/23/2023  HPI: Tyler Cardin. is a 68 y.o. male had surgery on September 15, 2023, now s/p robotic left inguinal hernia repair.  He reports some lingering discomfort in the left testicle, stable.  He has had a follow-up ultrasound and is seeing urology and has had his options reviewed.  He had sustained a severe diverticulitis attack, which he is currently taking antibiotics for.  Any lingering discomfort has been in the suprapubic area.  He denies any left lower quadrant pain, reports small bowel movements and flatus.  Denies fevers or chills.    Vital signs: BP 136/85   Pulse 64   Ht 5' 10 (1.778 m)   Wt 197 lb (89.4 kg)   SpO2 98%   BMI 28.27 kg/m    Physical Exam: Constitutional: He appears well, Abdomen: Soft benign nontender.  No left lower quadrant tenderness or guarding.  Some suprapubic subjective tenderness noted. Skin: Left groin without swelling or change with Valsalva.  Persistent hydrocele.    CLINICAL DATA:  Lower abdominal pain   EXAM: CT ABDOMEN AND PELVIS WITH CONTRAST   TECHNIQUE: Multidetector CT imaging of the abdomen and pelvis was performed using the standard protocol following bolus administration of intravenous contrast.   RADIATION DOSE REDUCTION: This exam was performed according to the departmental dose-optimization program which includes automated exposure control, adjustment of the mA and/or kV according to patient size and/or use of iterative reconstruction technique.   CONTRAST:  OMNIPAQUE  IOHEXOL  300 MG/ML  SOLN   COMPARISON:  08/24/2023   FINDINGS: Lower chest: Linear dependent atelectasis or scarring. No effusions.   Hepatobiliary: No focal hepatic abnormality. Gallbladder unremarkable.   Pancreas: No focal abnormality or ductal dilatation.   Spleen: No focal abnormality.  Normal size.   Adrenals/Urinary Tract: Adrenal glands normal. 3 cm midpole right renal cyst appears  benign and unchanged. No follow-up imaging recommended. No stones or hydronephrosis. Urinary bladder unremarkable.   Stomach/Bowel: Sigmoid diverticulosis. Inflammatory stranding around the mid sigmoid colon compatible with active diverticulitis. Stomach and small bowel decompressed. No bowel obstruction. Normal appendix.   Vascular/Lymphatic: Aortic atherosclerosis. No evidence of aneurysm or adenopathy.   Reproductive: No visible focal abnormality.   Other: No free fluid or free air.   Musculoskeletal: No acute bony abnormality.   IMPRESSION: Sigmoid diverticulosis. Extensive stranding around the mid sigmoid colon compatible with active diverticulitis. No visible extraluminal air or abscess at this time.   Aortic atherosclerosis.     Electronically Signed   By: Franky Crease M.D.   On: 11/17/2023 21:26  CLINICAL DATA:  Left scrotal swelling   EXAM: SCROTAL ULTRASOUND   DOPPLER ULTRASOUND OF THE TESTICLES   TECHNIQUE: Complete ultrasound examination of the testicles, epididymis, and other scrotal structures was performed. Color and spectral Doppler ultrasound were also utilized to evaluate blood flow to the testicles.   COMPARISON:  10/04/2022   FINDINGS: Right testicle   Measurements: 5 x 2.5 x 3 cm.  No mass or microlithiasis visualized.   Left testicle   Measurements: 4.3 x 2.7 x 2.7 cm. No mass or microlithiasis visualized.   Right epididymis:  Normal in size and appearance.   Left epididymis:  Normal in size and appearance.   Hydrocele:  Large left hydrocele, increasing since prior study.   Varicocele:  None visualized.   Pulsed Doppler interrogation of both testes demonstrates normal low resistance arterial and venous waveforms bilaterally.   IMPRESSION: Large left hydrocele, increasing since  prior study.   No testicular abnormality.     Electronically Signed   By: Franky Crease M.D.   On: 10/29/2023 21:20   WBC 11.7 on  11/17/2023  Assessment/Plan: This is a 68 y.o. male status post robotic left inguinal hernia.  Lingering left scrotal hydrocele, currently on Abx for diverticulitis, day 6/7.   Progressing well.  Patient Active Problem List   Diagnosis Date Noted   Diverticulitis of colon without hemorrhage 08/29/2023   Left inguinal hernia 07/27/2023   Fatty liver 07/27/2023   LLQ pain 07/11/2023   PND (post-nasal drip) 02/21/2023   Insomnia 10/31/2022   History of colonic polyps 07/07/2022   Vision changes 02/18/2022   Rosacea 11/12/2021   COPD (chronic obstructive pulmonary disease) (HCC) 09/15/2021   Aortic atherosclerosis 09/15/2021   T9 vertebral fracture, sequela 03/25/2021   Levoscoliosis of thoracic spine 02/25/2021   Chronic midline thoracic back pain 02/25/2021   Claustrophobia 02/04/2021   Thoracic compression fracture, sequela (T1 & T2) 02/04/2021   Cervicalgia 01/19/2021   Spondylosis without myelopathy or radiculopathy, cervical region 01/19/2021   Long term prescription benzodiazepine use 01/04/2021   Chronic neck pain (1ry area of Pain) (Bilateral) (L>R) 01/04/2021   Cervicogenic headache (3ry area of Pain) (Bilateral) 01/04/2021   Lumbosacral radiculopathy at S1 (Left) 01/04/2021   Chronic upper back pain (6th area of Pain) 01/04/2021   Cervical facet syndrome (Bilateral) 01/04/2021   Abnormal MRI, lumbar spine (11/18/2017) 01/04/2021   DDD (degenerative disc disease), lumbar 01/04/2021   Lumbar facet arthropathy (Multilevel) (Bilateral) 01/04/2021   Lumbar facet syndrome 01/04/2021   Abnormal MRI, cervical spine (02/17/2021) 01/04/2021   DDD (degenerative disc disease), cervical 01/04/2021   Cervical facet hypertrophy 01/04/2021   Grade 1 Cervical Retrolisthesis of C4/C5 and C5/C6 01/04/2021   Chronic pain syndrome 01/01/2021   Pharmacologic therapy 01/01/2021   Disorder of skeletal system 01/01/2021   Problems influencing health status 01/01/2021   Skin cyst 06/03/2020    Epididymal cyst 06/03/2020   History of Helicobacter pylori infection 05/17/2019   GERD (gastroesophageal reflux disease) 01/16/2018   Sleep apnea 01/16/2018   Chronic upper extremity pain (2ry area of Pain) (Bilateral) (L>R) 12/13/2017   Chronic lower extremity pain (5th area of Pain) (Bilateral) (L>R) 12/13/2017   Chronic cervical radiculopathy (C7, C8) (Left) 01/23/2017   Hypertension 01/23/2017   Chronic constipation 05/19/2016   Chronic low back pain with left-sided sciatica 03/30/2016   History of atrial flutter    Elevated PSA 12/24/2015   Dyspnea    Adjustment disorder with mixed anxiety and depressed mood 07/23/2015   OAB (overactive bladder) 03/31/2015   Viral upper respiratory illness 11/04/2014   BPH (benign prostatic hyperplasia) 11/19/2013   Hyperlipidemia 11/19/2013   Cervical foraminal stenosis (C4-5, C5-6) (Left) 03/08/2013   Lipoma of back 03/08/2013   Benign prostatic hyperplasia with urinary obstruction 08/23/2012   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 Complete course of abx, and consider repeat imaging, more urgent f/u with us  should symptoms, recur/persist.   -f/u with urology in the future.    [name] M.D., FACS 11/23/2023, 9:23 AM

## 2023-11-23 ENCOUNTER — Encounter: Payer: Self-pay | Admitting: Nurse Practitioner

## 2023-11-23 ENCOUNTER — Ambulatory Visit (INDEPENDENT_AMBULATORY_CARE_PROVIDER_SITE_OTHER): Admitting: Surgery

## 2023-11-23 ENCOUNTER — Ambulatory Visit (INDEPENDENT_AMBULATORY_CARE_PROVIDER_SITE_OTHER): Admitting: Nurse Practitioner

## 2023-11-23 ENCOUNTER — Ambulatory Visit: Attending: Nurse Practitioner

## 2023-11-23 ENCOUNTER — Encounter: Payer: Self-pay | Admitting: Surgery

## 2023-11-23 VITALS — BP 120/72 | HR 72 | Temp 98.2°F | Ht 70.0 in | Wt 197.6 lb

## 2023-11-23 VITALS — BP 136/85 | HR 64 | Ht 70.0 in | Wt 197.0 lb

## 2023-11-23 DIAGNOSIS — G4700 Insomnia, unspecified: Secondary | ICD-10-CM

## 2023-11-23 DIAGNOSIS — I491 Atrial premature depolarization: Secondary | ICD-10-CM | POA: Diagnosis not present

## 2023-11-23 DIAGNOSIS — K5732 Diverticulitis of large intestine without perforation or abscess without bleeding: Secondary | ICD-10-CM | POA: Diagnosis not present

## 2023-11-23 DIAGNOSIS — K219 Gastro-esophageal reflux disease without esophagitis: Secondary | ICD-10-CM

## 2023-11-23 DIAGNOSIS — Z09 Encounter for follow-up examination after completed treatment for conditions other than malignant neoplasm: Secondary | ICD-10-CM

## 2023-11-23 DIAGNOSIS — K5790 Diverticulosis of intestine, part unspecified, without perforation or abscess without bleeding: Secondary | ICD-10-CM

## 2023-11-23 DIAGNOSIS — Z8719 Personal history of other diseases of the digestive system: Secondary | ICD-10-CM | POA: Insufficient documentation

## 2023-11-23 DIAGNOSIS — I10 Essential (primary) hypertension: Secondary | ICD-10-CM | POA: Diagnosis not present

## 2023-11-23 DIAGNOSIS — E785 Hyperlipidemia, unspecified: Secondary | ICD-10-CM

## 2023-11-23 DIAGNOSIS — N433 Hydrocele, unspecified: Secondary | ICD-10-CM

## 2023-11-23 DIAGNOSIS — K4090 Unilateral inguinal hernia, without obstruction or gangrene, not specified as recurrent: Secondary | ICD-10-CM

## 2023-11-23 NOTE — Assessment & Plan Note (Signed)
 Diagnosed in the ED last week. Started on Cipro  and Flagyl . Continues to have abdominal pain and constipation. Encourage fluid intake and Miralax as prescribed. Complete antibiotics as prescribed. Encourage dietary modifications. Follow up with GI as needed.

## 2023-11-23 NOTE — Patient Instructions (Signed)
Please call the office if you have any questions or concerns. 

## 2023-11-23 NOTE — Assessment & Plan Note (Signed)
 He has difficulty sleeping, possibly related to stress and recent health issues. He is not consistently taking trazodone . Consider resuming trazodone  as needed for sleep.

## 2023-11-23 NOTE — Assessment & Plan Note (Signed)
 He experiences episodes of dizziness, sweating, and visual disturbances possibly related to PSVCs. Differential includes hypoglycemia, anxiety, or cardiac arrhythmia, with stress as a potential factor. Order a cardiac event monitor for 14 days. Strict precautions given to patient.

## 2023-11-23 NOTE — Assessment & Plan Note (Signed)
 He is currently not taking Protonix  due to concerns about diverticulitis but plans to resume once symptoms improve. Resume Protonix  when diverticulitis symptoms improve.

## 2023-11-23 NOTE — Assessment & Plan Note (Signed)
 His blood pressure is well-controlled on the current medication regimen. He is compliant with losartan . Continue losartan  as prescribed.

## 2023-11-23 NOTE — Assessment & Plan Note (Signed)
 His condition is currently managed with Lipitor. Continue Lipitor as prescribed.

## 2023-11-23 NOTE — Progress Notes (Signed)
 Tyler Glance, NP-C Phone: [phone]  [name]. is a 68 y.o. male who presents today for follow up.   Discussed the use of AI scribe software for clinical note transcription with the patient, who gave verbal consent to proceed.  History of Present Illness   [name]. is a 68 year old male who presents with episodes of dizziness and sweating.  He experiences episodes of dizziness and sweating, primarily occurring around 3 AM. During these episodes, he feels unable to move due to dizziness and visual disturbances described as 'white dots, just like stars'. These episodes resolve after a few minutes, but he experiences shortness of breath and is unable to stand without feeling like he might fall. He has had two such episodes in the past two months.  He underwent an EKG at the hospital and was told it was abnormal, but he did not seek further emergency care during the most recent episode.  He has a history of diverticulitis and has not had a bowel movement in five days, with only a small amount today. He has not been taking his Protonix  due to concerns about its effects on diverticulitis.  He has a history of COPD and reports chronic shortness of breath, but no worsening beyond his baseline. He is currently taking losartan  for blood pressure, which he monitors at home, and reports it is well-controlled.  He has not been taking trazodone  regularly and reports poor sleep, often waking to use the bathroom.  He has a history of a hernia repair and reports ongoing pain and a swollen testicle.  He reports significant stress due to his dog's illness and other personal issues, which may be contributing to his symptoms. No alcohol, caffeine , or smoking use. He has a history of sleep apnea but does not believe it is related to his current symptoms.      Social History   Tobacco Use  Smoking Status Former   Current packs/day: 0.00   Average packs/day: 1 pack/day for 20.0 years (20.0  ttl pk-yrs)   Types: Cigarettes   Start date: 02/08/1971   Quit date: 02/08/1991   Years since quitting: 32.8   Passive exposure: Past  Smokeless Tobacco Never    Current Outpatient Medications on File Prior to Visit  Medication Sig Dispense Refill   ADVAIR  HFA 115-21 MCG/ACT inhaler USE 2 INHALATIONS BY MOUTH TWICE DAILY (Patient taking differently: Inhale 2 puffs into the lungs daily.) 36 g 3   albuterol  (VENTOLIN  HFA) 108 (90 Base) MCG/ACT inhaler Inhale 2 puffs into the lungs every 4 (four) hours as needed for wheezing or shortness of breath. 8 g 2   atorvastatin  (LIPITOR) 40 MG tablet Take 1 tablet (40 mg total) by mouth every morning. 90 tablet 3   ciprofloxacin  (CIPRO ) 500 MG tablet Take 1 tablet (500 mg total) by mouth 2 (two) times daily for 7 days. 14 tablet 0   finasteride  (PROSCAR ) 5 MG tablet Take 1 tablet (5 mg total) by mouth daily. 90 tablet 3   ibuprofen  (ADVIL ) 800 MG tablet Take 1 tablet (800 mg total) by mouth every 8 (eight) hours as needed. 30 tablet 0   losartan  (COZAAR ) 50 MG tablet Take 1 tablet (50 mg total) by mouth daily. 90 tablet 0   metroNIDAZOLE  (FLAGYL ) 500 MG tablet Take 1 tablet (500 mg total) by mouth 4 (four) times daily for 7 days. 28 tablet 0   Multiple Vitamin (MULTIVITAMIN WITH MINERALS) TABS tablet Take 1 tablet by  mouth in the morning.     pantoprazole  (PROTONIX ) 40 MG tablet Take 1 tablet (40 mg total) by mouth daily. 90 tablet 3   Plecanatide  (TRULANCE ) 3 MG TABS Take 1 tablet (3 mg total) by mouth daily as needed (constipation). 90 tablet 1   polyethylene glycol (MIRALAX) 17 g packet Take 17 g by mouth daily. 14 each 0   senna (SENOKOT) 8.6 MG tablet Take 2 tablets by mouth daily with supper.     tadalafil  (CIALIS ) 5 MG tablet TAKE 1 TABLET BY MOUTH DAILY 90 tablet 3   traZODone  (DESYREL ) 50 MG tablet Take 0.5-1 tablets (25-50 mg total) by mouth at bedtime as needed for sleep. 30 tablet 3   No current facility-administered medications on file prior  to visit.     ROS see history of present illness  Objective  Physical Exam Vitals:   11/23/23 1508  BP: 120/72  Pulse: 72  Temp: 98.2 F (36.8 C)  SpO2: 97%    BP Readings from Last 3 Encounters:  11/23/23 120/72  11/23/23 136/85  11/17/23 128/71   Wt Readings from Last 3 Encounters:  11/23/23 197 lb 9.6 oz (89.6 kg)  11/23/23 197 lb (89.4 kg)  11/09/23 200 lb (90.7 kg)    Physical Exam Constitutional:      General: He is not in acute distress.    Appearance: Normal appearance.  HENT:     Head: Normocephalic.  Cardiovascular:     Rate and Rhythm: Normal rate and regular rhythm.     Heart sounds: Normal heart sounds.  Pulmonary:     Effort: Pulmonary effort is normal.     Breath sounds: Normal breath sounds.  Skin:    General: Skin is warm and dry.  Neurological:     General: No focal deficit present.     Mental Status: He is alert.  Psychiatric:        Mood and Affect: Mood normal.        Behavior: Behavior normal.      Assessment/Plan: Please see individual problem list.  Premature supraventricular beats Assessment & Plan: He experiences episodes of dizziness, sweating, and visual disturbances possibly related to PSVCs. Differential includes hypoglycemia, anxiety, or cardiac arrhythmia, with stress as a potential factor. Order a cardiac event monitor for 14 days. Strict precautions given to patient.   Orders: -     LONG TERM MONITOR (3-14 DAYS); Future  Diverticulitis of colon without hemorrhage Assessment & Plan: Diagnosed in the ED last week. Started on Cipro  and Flagyl . Continues to have abdominal pain and constipation. Encourage fluid intake and Miralax as prescribed. Complete antibiotics as prescribed. Encourage dietary modifications. Follow up with GI as needed.    Primary hypertension Assessment & Plan: His blood pressure is well-controlled on the current medication regimen. He is compliant with losartan . Continue losartan  as  prescribed.   Hyperlipidemia, unspecified hyperlipidemia type Assessment & Plan: His condition is currently managed with Lipitor. Continue Lipitor as prescribed.   Gastroesophageal reflux disease, unspecified whether esophagitis present Assessment & Plan: He is currently not taking Protonix  due to concerns about diverticulitis but plans to resume once symptoms improve. Resume Protonix  when diverticulitis symptoms improve.   Insomnia, unspecified type Assessment & Plan: He has difficulty sleeping, possibly related to stress and recent health issues. He is not consistently taking trazodone . Consider resuming trazodone  as needed for sleep.     Return in about 3 months (around 02/23/2024) for Follow up.   Tyler Glance, NP-C [facility] [employer]

## 2023-12-13 DIAGNOSIS — M546 Pain in thoracic spine: Secondary | ICD-10-CM | POA: Diagnosis not present

## 2023-12-13 DIAGNOSIS — M542 Cervicalgia: Secondary | ICD-10-CM | POA: Diagnosis not present

## 2023-12-13 DIAGNOSIS — M545 Low back pain, unspecified: Secondary | ICD-10-CM | POA: Diagnosis not present

## 2023-12-18 DIAGNOSIS — I491 Atrial premature depolarization: Secondary | ICD-10-CM | POA: Diagnosis not present

## 2023-12-19 DIAGNOSIS — I491 Atrial premature depolarization: Secondary | ICD-10-CM | POA: Diagnosis not present

## 2023-12-20 ENCOUNTER — Telehealth: Payer: Self-pay

## 2023-12-20 ENCOUNTER — Ambulatory Visit: Payer: Self-pay | Admitting: Nurse Practitioner

## 2023-12-20 NOTE — Telephone Encounter (Signed)
 Copied from CRM [id number]. Topic: Appointments - Scheduling Inquiry for Clinic >> Dec 20, 2023  9:52 AM Mercedes MATSU wrote: Reason for CRM: Patient wants to see [place] sooner than he is scheduled due to his recent heart monitor results. Patient is requesting a call back and can be reached at [phone].  I spoke with patient and scheduled an appointment for him to see Leron Glance, FNP-C, on 12/22/2023.

## 2023-12-22 ENCOUNTER — Ambulatory Visit: Admitting: Nurse Practitioner

## 2023-12-26 ENCOUNTER — Other Ambulatory Visit: Payer: Self-pay | Admitting: Family Medicine

## 2023-12-26 DIAGNOSIS — M5414 Radiculopathy, thoracic region: Secondary | ICD-10-CM

## 2023-12-26 DIAGNOSIS — M5416 Radiculopathy, lumbar region: Secondary | ICD-10-CM | POA: Diagnosis not present

## 2023-12-26 DIAGNOSIS — M5412 Radiculopathy, cervical region: Secondary | ICD-10-CM | POA: Diagnosis not present

## 2023-12-26 DIAGNOSIS — M5380 Other specified dorsopathies, site unspecified: Secondary | ICD-10-CM | POA: Diagnosis not present

## 2023-12-26 DIAGNOSIS — M549 Dorsalgia, unspecified: Secondary | ICD-10-CM | POA: Diagnosis not present

## 2023-12-26 DIAGNOSIS — Z8781 Personal history of (healed) traumatic fracture: Secondary | ICD-10-CM | POA: Diagnosis not present

## 2023-12-28 DIAGNOSIS — K5792 Diverticulitis of intestine, part unspecified, without perforation or abscess without bleeding: Secondary | ICD-10-CM | POA: Diagnosis not present

## 2023-12-28 DIAGNOSIS — K5732 Diverticulitis of large intestine without perforation or abscess without bleeding: Secondary | ICD-10-CM | POA: Diagnosis not present

## 2023-12-30 ENCOUNTER — Ambulatory Visit
Admission: RE | Admit: 2023-12-30 | Discharge: 2023-12-30 | Disposition: A | Discharge status: Home / Self Care | Source: Ambulatory Visit | Attending: Family Medicine | Admitting: Family Medicine

## 2023-12-30 DIAGNOSIS — M5414 Radiculopathy, thoracic region: Secondary | ICD-10-CM | POA: Insufficient documentation

## 2023-12-30 DIAGNOSIS — M5114 Intervertebral disc disorders with radiculopathy, thoracic region: Secondary | ICD-10-CM | POA: Diagnosis not present

## 2023-12-30 DIAGNOSIS — M4854XA Collapsed vertebra, not elsewhere classified, thoracic region, initial encounter for fracture: Secondary | ICD-10-CM | POA: Diagnosis not present

## 2023-12-30 DIAGNOSIS — M542 Cervicalgia: Secondary | ICD-10-CM | POA: Diagnosis not present

## 2023-12-30 DIAGNOSIS — M4187 Other forms of scoliosis, lumbosacral region: Secondary | ICD-10-CM | POA: Diagnosis not present

## 2024-01-12 ENCOUNTER — Other Ambulatory Visit: Payer: Self-pay | Admitting: Urology

## 2024-01-12 DIAGNOSIS — R972 Elevated prostate specific antigen [PSA]: Secondary | ICD-10-CM

## 2024-01-15 NOTE — Telephone Encounter (Unsigned)
 Copied from CRM #[id number]. Topic: Clinical - Medication Refill >> Jan 15, 2024 12:27 PM Olam RAMAN wrote: Medication: pantoprazole  (PROTONIX ) 40 MG tablet losartan  (COZAAR ) 50 MG tablet atorvastatin  (LIPITOR) 40 MG tablet traZODone  (DESYREL ) 50 MG tablet   Has the patient contacted their pharmacy? Yes (Agent: If no, request that the patient contact the pharmacy for the refill. If patient does not wish to contact the pharmacy document the reason why and proceed with request.) (Agent: If yes, when and what did the pharmacy advise?)  This is the patient's preferred pharmacy:  [facility] - [place], [place] - [address] [place] [address] Phone: [phone] Fax: [phone]  [facility] [id number] [place], [place] - [address] [place] [address] Phone: [phone] Fax: [phone]  [id number] - [place], [place] - [address] AT [facility] [address] [facility] [address] [place] [address] Phone: [phone] Fax: [phone]  Is this the correct pharmacy for this prescription? Yes If no, delete pharmacy and type the correct one.   Has the prescription been filled recently? No  Is the patient out of the medication? No  Has the patient been seen for an appointment in the last year OR does the patient have an upcoming appointment? Yes  Can we respond through MyChart? Yes  Agent: Please be advised that Rx refills may take up to 3 business days. We ask that you follow-up with your pharmacy.

## 2024-01-18 ENCOUNTER — Telehealth: Payer: Self-pay

## 2024-01-18 DIAGNOSIS — E782 Mixed hyperlipidemia: Secondary | ICD-10-CM

## 2024-01-18 MED ORDER — LOSARTAN POTASSIUM 50 MG PO TABS: 50.0000 mg | ORAL_TABLET | Freq: Every day | ORAL | 3 refills | Status: AC

## 2024-01-18 MED ORDER — PANTOPRAZOLE SODIUM 40 MG PO TBEC: 40.0000 mg | DELAYED_RELEASE_TABLET | Freq: Every day | ORAL | 3 refills | Status: AC

## 2024-01-18 NOTE — Telephone Encounter (Signed)
 All meds refilled except for trazodone . Pt has an appt tomorrow morning with PCP can refill at this time.

## 2024-01-18 NOTE — Telephone Encounter (Signed)
 Copied from CRM #[id number]. Topic: Clinical - Medication Refill >> Jan 15, 2024 12:27 PM Olam RAMAN wrote: Medication: pantoprazole  (PROTONIX ) 40 MG tablet losartan  (COZAAR ) 50 MG tablet atorvastatin  (LIPITOR) 40 MG tablet traZODone  (DESYREL ) 50 MG tablet   Has the patient contacted their pharmacy? Yes (Agent: If no, request that the patient contact the pharmacy for the refill. If patient does not wish to contact the pharmacy document the reason why and proceed with request.) (Agent: If yes, when and what did the pharmacy advise?)  This is the patient's preferred pharmacy:  [facility] - [place], [place] - [address] [place] [address] Phone: [phone] Fax: [phone]  [facility] [id number] [place], [place] - [address] [place] [address] Phone: [phone] Fax: [phone]  [id number] - [place], [place] - [address] AT [facility] [address] [facility] [address] [place] [address] Phone: [phone] Fax: [phone]  Is this the correct pharmacy for this prescription? Yes If no, delete pharmacy and type the correct one.   Has the prescription been filled recently? No  Is the patient out of the medication? No  Has the patient been seen for an appointment in the last year OR does the patient have an upcoming appointment? Yes  Can we respond through MyChart? Yes  Agent: Please be advised that Rx refills may take up to 3 business days. We ask that you follow-up with your pharmacy. >> Jan 18, 2024  8:54 AM Ivette P wrote: Pt called in to follow up on request for med refill.  Today will be day 3 and pt would like to know if medication will be sent.   Pt would like it to go to his mail service.   [facility] - [place], [place] - [address] [place], [place] [address] Phone: [phone]  Fax: [phone]

## 2024-01-18 NOTE — Addendum Note (Signed)
 Addended by: ORLANDO KINGDOM on: 01/18/2024 09:17 AM   Modules accepted: Orders

## 2024-01-19 ENCOUNTER — Ambulatory Visit: Admitting: Nurse Practitioner

## 2024-01-19 ENCOUNTER — Ambulatory Visit: Payer: Self-pay | Admitting: Nurse Practitioner

## 2024-01-19 ENCOUNTER — Encounter: Payer: Self-pay | Admitting: Nurse Practitioner

## 2024-01-19 VITALS — BP 120/82 | HR 62 | Temp 97.9°F | Ht 70.0 in | Wt 196.6 lb

## 2024-01-19 DIAGNOSIS — G4700 Insomnia, unspecified: Secondary | ICD-10-CM | POA: Diagnosis not present

## 2024-01-19 DIAGNOSIS — R7303 Prediabetes: Secondary | ICD-10-CM | POA: Insufficient documentation

## 2024-01-19 DIAGNOSIS — I10 Essential (primary) hypertension: Secondary | ICD-10-CM | POA: Diagnosis not present

## 2024-01-19 DIAGNOSIS — K5732 Diverticulitis of large intestine without perforation or abscess without bleeding: Secondary | ICD-10-CM | POA: Diagnosis not present

## 2024-01-19 DIAGNOSIS — E785 Hyperlipidemia, unspecified: Secondary | ICD-10-CM | POA: Diagnosis not present

## 2024-01-19 LAB — COMPREHENSIVE METABOLIC PANEL WITH GFR
ALT: 14 U/L (ref 0–53)
AST: 18 U/L (ref 0–37)
Albumin: 4.5 g/dL (ref 3.5–5.2)
Alkaline Phosphatase: 76 U/L (ref 39–117)
BUN: 17 mg/dL (ref 6–23)
CO2: 33 meq/L — ABNORMAL HIGH (ref 19–32)
Calcium: 10 mg/dL (ref 8.4–10.5)
Chloride: 102 meq/L (ref 96–112)
Creatinine, Ser: 0.9 mg/dL (ref 0.40–1.50)
GFR: 88.24 mL/min (ref >60.00)
Glucose, Bld: 85 mg/dL (ref 70–99)
Potassium: 4.9 meq/L (ref 3.5–5.1)
Sodium: 142 meq/L (ref 135–145)
Total Bilirubin: 1.6 mg/dL — ABNORMAL HIGH (ref 0.2–1.2)
Total Protein: 7 g/dL (ref 6.0–8.3)

## 2024-01-19 LAB — CBC WITH DIFFERENTIAL/PLATELET
Basophils Absolute: 0 K/uL (ref 0.0–0.1)
Basophils Relative: 0.4 % (ref 0.0–3.0)
Eosinophils Absolute: 0.1 K/uL (ref 0.0–0.7)
Eosinophils Relative: 1.8 % (ref 0.0–5.0)
HCT: 46.3 % (ref 39.0–52.0)
Hemoglobin: 15.5 g/dL (ref 13.0–17.0)
Lymphocytes Relative: 34.1 % (ref 12.0–46.0)
Lymphs Abs: 1.9 K/uL (ref 0.7–4.0)
MCHC: 33.6 g/dL (ref 30.0–36.0)
MCV: 88.7 fl (ref 78.0–100.0)
Monocytes Absolute: 0.6 K/uL (ref 0.1–1.0)
Monocytes Relative: 10.5 % (ref 3.0–12.0)
Neutro Abs: 2.9 K/uL (ref 1.4–7.7)
Neutrophils Relative %: 53.2 % (ref 43.0–77.0)
Platelets: 218 K/uL (ref 150.0–400.0)
RBC: 5.22 Mil/uL (ref 4.22–5.81)
RDW: 14.9 % (ref 11.5–15.5)
WBC: 5.4 K/uL (ref 4.0–10.5)

## 2024-01-19 LAB — LIPID PANEL
Cholesterol: 138 mg/dL (ref 0–200)
HDL: 47.9 mg/dL (ref >39.00)
LDL Cholesterol: 75 mg/dL (ref 0–99)
NonHDL: 89.67
Total CHOL/HDL Ratio: 3
Triglycerides: 74 mg/dL (ref 0.0–149.0)
VLDL: 14.8 mg/dL (ref 0.0–40.0)

## 2024-01-19 LAB — HEMOGLOBIN A1C: Hgb A1c MFr Bld: 5.7 % (ref 4.6–6.5)

## 2024-01-19 MED ORDER — TRAZODONE HCL 50 MG PO TABS: 25.0000 mg | ORAL_TABLET | Freq: Every evening | ORAL | 3 refills | Status: AC | PRN

## 2024-01-19 NOTE — Assessment & Plan Note (Signed)
 His blood pressure is well-controlled on the current medication regimen. He is compliant with losartan . Continue losartan  as prescribed.

## 2024-01-19 NOTE — Assessment & Plan Note (Signed)
 Intermittent insomnia is managed with trazodone  25-50 mg at bedtime as needed. Not taking daily. Continue. Refills sent.

## 2024-01-19 NOTE — Assessment & Plan Note (Signed)
 Check A1c. Encourage healthy diet and regular exercise.

## 2024-01-19 NOTE — Assessment & Plan Note (Signed)
 Managed with Lipitor. Previous cholesterol levels were below target after dietary changes. Continue Lipitor as prescribed. Check lipid panel today.

## 2024-01-19 NOTE — Assessment & Plan Note (Signed)
 Treated with Cipro  and Flagyl  in October. Evaluated by GI in November who recommended sigmoidoscopy if pain persisted after first of the year. Continues to have lower abdominal pain. Advised follow up with GI. Return precautions given to patient.

## 2024-01-19 NOTE — Progress Notes (Signed)
 Tyler Glance, NP-C Phone: [phone]  [name]. is a 68 y.o. male who presents today for follow up.   Discussed the use of AI scribe software for clinical note transcription with the patient, who gave verbal consent to proceed.  History of Present Illness   [name] is a 68 year old male who presents for follow up.  He experienced a recent severe episode of diverticulitis while in [place], which caused significant pain during a two and a half hour drive home. He sought hospital care but was unable to be seen at urgent care due to long wait times. He continues to experience daily abdominal pain, described as 'deep' and persistent. He was evaluated by Gastroenterology last month.   He has a history of hernia repair and has consulted multiple specialists, including gastroenterology, urology, and physical medicine, for his pain. He has a history of hernia repair and reports that he had no issues with his previous hernia after it was repaired.  He uses trazodone  infrequently for sleep, taking only half a pill occasionally, and has not taken a full pill yet. He is concerned about the effects of trazodone  on his sleep and waking up.  He has been monitoring his cholesterol and mentions dietary changes, such as stopping ice cream, which helped lower his cholesterol levels in the past.      Tobacco Use History[1]  Medications Ordered Prior to Encounter[2]   ROS see history of present illness  Objective  Physical Exam Vitals:   01/19/24 1024  BP: 120/82  Pulse: 62  Temp: 97.9 F (36.6 C)  SpO2: 97%    BP Readings from Last 3 Encounters:  01/19/24 120/82  11/23/23 120/72  11/23/23 136/85   Wt Readings from Last 3 Encounters:  01/19/24 196 lb 9.6 oz (89.2 kg)  11/23/23 197 lb 9.6 oz (89.6 kg)  11/23/23 197 lb (89.4 kg)    Physical Exam Constitutional:      General: He is not in acute distress.    Appearance: Normal appearance.  HENT:     Head: Normocephalic.   Cardiovascular:     Rate and Rhythm: Normal rate and regular rhythm.     Heart sounds: Normal heart sounds.  Pulmonary:     Effort: Pulmonary effort is normal.     Breath sounds: Normal breath sounds.  Skin:    General: Skin is warm and dry.  Neurological:     General: No focal deficit present.     Mental Status: He is alert.  Psychiatric:        Mood and Affect: Mood normal.        Behavior: Behavior normal.      Assessment/Plan: Please see individual problem list.  Insomnia, unspecified type Assessment & Plan: Intermittent insomnia is managed with trazodone  25-50 mg at bedtime as needed. Not taking daily. Continue. Refills sent.   Orders: -     traZODone  HCl; Take 0.5-1 tablets (25-50 mg total) by mouth at bedtime as needed for sleep.  Dispense: 90 tablet; Refill: 3  Diverticulitis of colon without hemorrhage Assessment & Plan: Treated with Cipro  and Flagyl  in October. Evaluated by GI in November who recommended sigmoidoscopy if pain persisted after first of the year. Continues to have lower abdominal pain. Advised follow up with GI. Return precautions given to patient.    Hyperlipidemia, unspecified hyperlipidemia type Assessment & Plan: Managed with Lipitor. Previous cholesterol levels were below target after dietary changes. Continue Lipitor as prescribed. Check lipid  panel today.   Orders: -     Comprehensive metabolic panel with GFR -     Lipid panel  Primary hypertension Assessment & Plan: His blood pressure is well-controlled on the current medication regimen. He is compliant with losartan . Continue losartan  as prescribed.  Orders: -     Comprehensive metabolic panel with GFR -     CBC with Differential/Platelet  Prediabetes Assessment & Plan: Check A1c. Encourage healthy diet and regular exercise.   Orders: -     Hemoglobin A1c      Return in about 6 months (around 07/19/2024) for Follow up.   Tyler Glance, NP-C [facility] -  [place]    [1]  Social History Tobacco Use  Smoking Status Former   Current packs/day: 0.00   Average packs/day: 1 pack/day for 20.0 years (20.0 ttl pk-yrs)   Types: Cigarettes   Start date: 02/08/1971   Quit date: 02/08/1991   Years since quitting: 32.9   Passive exposure: Past  Smokeless Tobacco Never  [2]  Current Outpatient Medications on File Prior to Visit  Medication Sig Dispense Refill   ADVAIR  HFA 115-21 MCG/ACT inhaler USE 2 INHALATIONS BY MOUTH TWICE DAILY (Patient taking differently: Inhale 2 puffs into the lungs daily.) 36 g 3   albuterol  (VENTOLIN  HFA) 108 (90 Base) MCG/ACT inhaler Inhale 2 puffs into the lungs every 4 (four) hours as needed for wheezing or shortness of breath. 8 g 2   atorvastatin  (LIPITOR) 40 MG tablet Take 1 tablet (40 mg total) by mouth every morning. 90 tablet 3   finasteride  (PROSCAR ) 5 MG tablet Take 1 tablet (5 mg total) by mouth daily. 90 tablet 3   losartan  (COZAAR ) 50 MG tablet Take 1 tablet (50 mg total) by mouth daily. 90 tablet 3   Multiple Vitamin (MULTIVITAMIN WITH MINERALS) TABS tablet Take 1 tablet by mouth in the morning.     pantoprazole  (PROTONIX ) 40 MG tablet Take 1 tablet (40 mg total) by mouth daily. 90 tablet 3   Plecanatide  (TRULANCE ) 3 MG TABS Take 1 tablet (3 mg total) by mouth daily as needed (constipation). 90 tablet 1   polyethylene glycol (MIRALAX ) 17 g packet Take 17 g by mouth daily. 14 each 0   senna (SENOKOT) 8.6 MG tablet Take 2 tablets by mouth daily with supper.     tadalafil  (CIALIS ) 5 MG tablet TAKE 1 TABLET BY MOUTH DAILY 90 tablet 3   No current facility-administered medications on file prior to visit.

## 2024-01-25 ENCOUNTER — Ambulatory Visit: Admitting: Nurse Practitioner

## 2024-02-20 ENCOUNTER — Ambulatory Visit: Admitting: Nurse Practitioner

## 2024-02-21 ENCOUNTER — Ambulatory Visit: Admitting: Nurse Practitioner

## 2024-03-11 ENCOUNTER — Inpatient Hospital Stay
Admission: EM | Admit: 2024-03-11 | Source: Home / Self Care | Attending: Internal Medicine | Admitting: Internal Medicine

## 2024-03-11 ENCOUNTER — Emergency Department

## 2024-03-11 ENCOUNTER — Other Ambulatory Visit: Payer: Self-pay

## 2024-03-11 ENCOUNTER — Encounter: Payer: Self-pay | Admitting: Emergency Medicine

## 2024-03-11 DIAGNOSIS — I10 Essential (primary) hypertension: Secondary | ICD-10-CM | POA: Diagnosis present

## 2024-03-11 DIAGNOSIS — R7301 Impaired fasting glucose: Secondary | ICD-10-CM | POA: Insufficient documentation

## 2024-03-11 DIAGNOSIS — K5792 Diverticulitis of intestine, part unspecified, without perforation or abscess without bleeding: Secondary | ICD-10-CM | POA: Diagnosis not present

## 2024-03-11 DIAGNOSIS — K631 Perforation of intestine (nontraumatic): Principal | ICD-10-CM

## 2024-03-11 DIAGNOSIS — E785 Hyperlipidemia, unspecified: Secondary | ICD-10-CM | POA: Diagnosis present

## 2024-03-11 DIAGNOSIS — G4730 Sleep apnea, unspecified: Secondary | ICD-10-CM | POA: Diagnosis present

## 2024-03-11 DIAGNOSIS — K5780 Diverticulitis of intestine, part unspecified, with perforation and abscess without bleeding: Secondary | ICD-10-CM

## 2024-03-11 DIAGNOSIS — J449 Chronic obstructive pulmonary disease, unspecified: Secondary | ICD-10-CM | POA: Diagnosis present

## 2024-03-11 DIAGNOSIS — K219 Gastro-esophageal reflux disease without esophagitis: Secondary | ICD-10-CM | POA: Diagnosis present

## 2024-03-11 LAB — CBC
HCT: 47.6 % (ref 39.0–52.0)
HCT: 45.6 % (ref 39.0–52.0)
Hemoglobin: 15.8 g/dL (ref 13.0–17.0)
Hemoglobin: 15.2 g/dL (ref 13.0–17.0)
MCH: 29.4 pg (ref 26.0–34.0)
MCH: 30 pg (ref 26.0–34.0)
MCHC: 33.2 g/dL (ref 30.0–36.0)
MCHC: 33.3 g/dL (ref 30.0–36.0)
MCV: 88.5 fL (ref 80.0–100.0)
MCV: 90.1 fL (ref 80.0–100.0)
Platelets: 199 10*3/uL (ref 150–400)
Platelets: 186 10*3/uL (ref 150–400)
RBC: 5.38 MIL/uL (ref 4.22–5.81)
RBC: 5.06 MIL/uL (ref 4.22–5.81)
RDW: 13.4 % (ref 11.5–15.5)
RDW: 13.5 % (ref 11.5–15.5)
WBC: 14.7 10*3/uL — ABNORMAL HIGH (ref 4.0–10.5)
WBC: 14.2 10*3/uL — ABNORMAL HIGH (ref 4.0–10.5)
nRBC: 0 % (ref 0.0–0.2)
nRBC: 0 % (ref 0.0–0.2)

## 2024-03-11 LAB — COMPREHENSIVE METABOLIC PANEL WITH GFR
ALT: 16 U/L (ref 0–44)
AST: 27 U/L (ref 15–41)
Albumin: 4 g/dL (ref 3.5–5.0)
Alkaline Phosphatase: 64 U/L (ref 38–126)
Anion gap: 12 (ref 5–15)
BUN: 17 mg/dL (ref 8–23)
CO2: 24 mmol/L (ref 22–32)
Calcium: 9.6 mg/dL (ref 8.9–10.3)
Chloride: 100 mmol/L (ref 98–111)
Creatinine, Ser: 0.87 mg/dL (ref 0.61–1.24)
GFR, Estimated: >60 mL/min
Glucose, Bld: 135 mg/dL — ABNORMAL HIGH (ref 70–99)
Potassium: 4.3 mmol/L (ref 3.5–5.1)
Sodium: 136 mmol/L (ref 135–145)
Total Bilirubin: 3 mg/dL — ABNORMAL HIGH (ref 0.0–1.2)
Total Protein: 7 g/dL (ref 6.5–8.1)

## 2024-03-11 LAB — CREATININE, SERUM
Creatinine, Ser: 0.97 mg/dL (ref 0.61–1.24)
GFR, Estimated: >60 mL/min

## 2024-03-11 LAB — URINALYSIS, ROUTINE W REFLEX MICROSCOPIC
Bilirubin Urine: NEGATIVE
Glucose, UA: NEGATIVE mg/dL
Hgb urine dipstick: NEGATIVE
Ketones, ur: 20 mg/dL — AB
Leukocytes,Ua: NEGATIVE
Nitrite: NEGATIVE
Protein, ur: NEGATIVE mg/dL
Specific Gravity, Urine: 1.021 (ref 1.005–1.030)
pH: 5 (ref 5.0–8.0)

## 2024-03-11 LAB — LIPASE, BLOOD: Lipase: 22 U/L (ref 11–51)

## 2024-03-11 LAB — LACTIC ACID, PLASMA
Lactic Acid, Venous: 0.9 mmol/L (ref 0.5–1.9)
Lactic Acid, Venous: 1.3 mmol/L (ref 0.5–1.9)

## 2024-03-11 MED ORDER — OXYCODONE HCL 5 MG PO TABS
5.0000 mg | ORAL_TABLET | ORAL | Status: AC | PRN
  Administered 2024-03-12 – 2024-03-13 (×3): 5 mg via ORAL
  Filled 2024-03-11 (×3): qty 1

## 2024-03-11 MED ORDER — ENOXAPARIN SODIUM 40 MG/0.4ML IJ SOSY
40.0000 mg | PREFILLED_SYRINGE | INTRAMUSCULAR | Status: AC
  Administered 2024-03-13 – 2024-03-15 (×3): 40 mg via SUBCUTANEOUS
  Filled 2024-03-11 (×4): qty 0.4

## 2024-03-11 MED ORDER — ACETAMINOPHEN 325 MG PO TABS
650.0000 mg | ORAL_TABLET | Freq: Four times a day (QID) | ORAL | Status: AC | PRN
  Administered 2024-03-12: 650 mg via ORAL
  Filled 2024-03-11: qty 2

## 2024-03-11 MED ORDER — HYDRALAZINE HCL 20 MG/ML IJ SOLN: 10.0000 mg | Freq: Four times a day (QID) | INTRAMUSCULAR | Status: AC | PRN

## 2024-03-11 MED ORDER — IPRATROPIUM-ALBUTEROL 0.5-2.5 (3) MG/3ML IN SOLN: 3.0000 mL | Freq: Four times a day (QID) | RESPIRATORY_TRACT | Status: AC | PRN

## 2024-03-11 MED ORDER — SODIUM CHLORIDE 0.9 % IV SOLN: INTRAVENOUS | Status: DC

## 2024-03-11 MED ORDER — PIPERACILLIN-TAZOBACTAM 3.375 G IVPB
3.3750 g | Freq: Three times a day (TID) | INTRAVENOUS | Status: AC
  Administered 2024-03-12 – 2024-03-15 (×12): 3.375 g via INTRAVENOUS
  Filled 2024-03-11 (×12): qty 50

## 2024-03-11 MED ORDER — ONDANSETRON HCL 4 MG PO TABS: 4.0000 mg | ORAL_TABLET | Freq: Four times a day (QID) | ORAL | Status: AC | PRN

## 2024-03-11 MED ORDER — IOHEXOL 300 MG/ML  SOLN
100.0000 mL | Freq: Once | INTRAMUSCULAR | Status: AC | PRN
  Administered 2024-03-11: 100 mL via INTRAVENOUS

## 2024-03-11 MED ORDER — PANTOPRAZOLE SODIUM 40 MG IV SOLR
40.0000 mg | Freq: Two times a day (BID) | INTRAVENOUS | Status: AC
  Administered 2024-03-12 – 2024-03-15 (×9): 40 mg via INTRAVENOUS
  Filled 2024-03-11 (×9): qty 10

## 2024-03-11 MED ORDER — ONDANSETRON HCL 4 MG/2ML IJ SOLN: 4.0000 mg | Freq: Four times a day (QID) | INTRAMUSCULAR | Status: AC | PRN

## 2024-03-11 MED ORDER — PIPERACILLIN-TAZOBACTAM 3.375 G IVPB 30 MIN
3.3750 g | Freq: Once | INTRAVENOUS | Status: AC
  Administered 2024-03-11: 3.375 g via INTRAVENOUS
  Filled 2024-03-11: qty 50

## 2024-03-11 MED ORDER — FLUTICASONE FUROATE-VILANTEROL 200-25 MCG/ACT IN AEPB
1.0000 | INHALATION_SPRAY | Freq: Every day | RESPIRATORY_TRACT | Status: AC
  Filled 2024-03-11: qty 28

## 2024-03-11 MED ORDER — MORPHINE SULFATE (PF) 2 MG/ML IV SOLN
2.0000 mg | INTRAVENOUS | Status: AC | PRN
  Administered 2024-03-11 – 2024-03-12 (×4): 2 mg via INTRAVENOUS
  Filled 2024-03-11 (×5): qty 1

## 2024-03-11 MED ORDER — ACETAMINOPHEN 650 MG RE SUPP: 650.0000 mg | Freq: Four times a day (QID) | RECTAL | Status: AC | PRN

## 2024-03-11 MED ORDER — PIPERACILLIN-TAZOBACTAM 3.375 G IVPB 30 MIN: 3.3750 g | Freq: Three times a day (TID) | INTRAVENOUS | Status: DC

## 2024-03-11 NOTE — ED Notes (Signed)
 Patient transported to CT

## 2024-03-11 NOTE — ED Triage Notes (Signed)
 Pt presents with severe abdominal pain, worse on the right. Hx of diverticulitis, feels this is the same.  Some nausea, no vomiting. Constipation.

## 2024-03-11 NOTE — ED Provider Notes (Signed)
 "  [facility] Provider Note    Event Date/Time   First MD Initiated Contact with Patient 03/11/24 1438     (approximate)   History   Abdominal Pain   HPI  [name]. is a 69 y.o. male who presents today for evaluation of lower abdominal pain for the past 2 days.  Patient reports that this feels similar to his episodes of diverticulitis.  Patient reports that he has had chills at home.  No burning with urination.  No blood in the stool.  Last bowel movement was yesterday.  Patient Active Problem List   Diagnosis Date Noted   Acute diverticulitis 03/11/2024   Prediabetes 01/19/2024   Status post laparoscopic hernia repair 11/23/2023   Hydrocele, left 11/23/2023   Premature supraventricular beats 11/23/2023   Diverticulitis of colon without hemorrhage 08/29/2023   Left inguinal hernia 07/27/2023   Fatty liver 07/27/2023   LLQ pain 07/11/2023   PND (post-nasal drip) 02/21/2023   Insomnia 10/31/2022   History of colonic polyps 07/07/2022   Vision changes 02/18/2022   Rosacea 11/12/2021   COPD (chronic obstructive pulmonary disease) (HCC) 09/15/2021   Aortic atherosclerosis 09/15/2021   T9 vertebral fracture, sequela 03/25/2021   Levoscoliosis of thoracic spine 02/25/2021   Chronic midline thoracic back pain 02/25/2021   Claustrophobia 02/04/2021   Thoracic compression fracture, sequela (T1 & T2) 02/04/2021   Cervicalgia 01/19/2021   Spondylosis without myelopathy or radiculopathy, cervical region 01/19/2021   Long term prescription benzodiazepine use 01/04/2021   Chronic neck pain (1ry area of Pain) (Bilateral) (L>R) 01/04/2021   Cervicogenic headache (3ry area of Pain) (Bilateral) 01/04/2021   Lumbosacral radiculopathy at S1 (Left) 01/04/2021   Chronic upper back pain (6th area of Pain) 01/04/2021   Cervical facet syndrome (Bilateral) 01/04/2021   Abnormal MRI, lumbar spine (11/18/2017) 01/04/2021   DDD (degenerative disc disease), lumbar  01/04/2021   Lumbar facet arthropathy (Multilevel) (Bilateral) 01/04/2021   Lumbar facet syndrome 01/04/2021   Abnormal MRI, cervical spine (02/17/2021) 01/04/2021   DDD (degenerative disc disease), cervical 01/04/2021   Cervical facet hypertrophy 01/04/2021   Grade 1 Cervical Retrolisthesis of C4/C5 and C5/C6 01/04/2021   Chronic pain syndrome 01/01/2021   Pharmacologic therapy 01/01/2021   Disorder of skeletal system 01/01/2021   Problems influencing health status 01/01/2021   Skin cyst 06/03/2020   Epididymal cyst 06/03/2020   History of Helicobacter pylori infection 05/17/2019   GERD (gastroesophageal reflux disease) 01/16/2018   Sleep apnea 01/16/2018   Chronic upper extremity pain (2ry area of Pain) (Bilateral) (L>R) 12/13/2017   Chronic lower extremity pain (5th area of Pain) (Bilateral) (L>R) 12/13/2017   Chronic cervical radiculopathy (C7, C8) (Left) 01/23/2017   Hypertension 01/23/2017   Chronic constipation 05/19/2016   Chronic low back pain with left-sided sciatica 03/30/2016   History of atrial flutter    Elevated PSA 12/24/2015   Dyspnea    Adjustment disorder with mixed anxiety and depressed mood 07/23/2015   OAB (overactive bladder) 03/31/2015   Viral upper respiratory illness 11/04/2014   BPH (benign prostatic hyperplasia) 11/19/2013   Hyperlipidemia 11/19/2013   Cervical foraminal stenosis (C4-5, C5-6) (Left) 03/08/2013   Lipoma of back 03/08/2013   Benign prostatic hyperplasia with urinary obstruction 08/23/2012   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/2012   Colon polyp 04/18/2011          Physical Exam   Triage Vital Signs: ED Triage Vitals [03/11/24 1348]  Encounter Vitals Group  BP 102/66     Girls Systolic BP Percentile      Girls Diastolic BP Percentile      Boys Systolic BP Percentile      Boys Diastolic BP Percentile      Pulse Rate (!) 119     Resp 20     Temp 98.5 F (36.9 C)     Temp Source Oral     SpO2 99 %      Weight      Height      Head Circumference      Peak Flow      Pain Score      Pain Loc      Pain Education      Exclude from Growth Chart     Most recent vital signs: Vitals:   03/11/24 1348 03/11/24 1622  BP: 102/66 128/70  Pulse: (!) 119 80  Resp: 20 18  Temp: 98.5 F (36.9 C) 98.4 F (36.9 C)  SpO2: 99% 96%    Physical Exam Vitals and nursing note reviewed.  Constitutional:      General: Awake and alert. No acute distress.    Appearance: Normal appearance. The patient is normal weight.  HENT:     Head: Normocephalic and atraumatic.     Mouth: Mucous membranes are moist.  Eyes:     General: PERRL. Normal EOMs        Right eye: No discharge.        Left eye: No discharge.     Conjunctiva/sclera: Conjunctivae normal.  Cardiovascular:     Rate and Rhythm: Normal rate and regular rhythm.     Pulses: Normal pulses.  Pulmonary:     Effort: Pulmonary effort is normal. No respiratory distress.     Breath sounds: Normal breath sounds.  Abdominal:     Abdomen is soft. There is right lower quadrant, left lower quadrant, and periumbilical abdominal tenderness.Guarding present. No distention. Musculoskeletal:        General: No swelling. Normal range of motion.     Cervical back: Normal range of motion and neck supple.  Skin:    General: Skin is warm and dry.     Capillary Refill: Capillary refill takes less than 2 seconds.     Findings: No rash.  Neurological:     Mental Status: The patient is awake and alert.      ED Results / Procedures / Treatments   Labs (all labs ordered are listed, but only abnormal results are displayed) Labs Reviewed  COMPREHENSIVE METABOLIC PANEL WITH GFR - Abnormal; Notable for the following components:      Result Value   Glucose, Bld 135 (*)    Total Bilirubin 3.0 (*)    All other components within normal limits  CBC - Abnormal; Notable for the following components:   WBC 14.7 (*)    All other components within normal limits   URINALYSIS, ROUTINE W REFLEX MICROSCOPIC - Abnormal; Notable for the following components:   Color, Urine YELLOW (*)    APPearance CLEAR (*)    Ketones, ur 20 (*)    All other components within normal limits  LIPASE, BLOOD  LACTIC ACID, PLASMA  LACTIC ACID, PLASMA  HIV ANTIBODY (ROUTINE TESTING W REFLEX)  CBC  CREATININE, SERUM     EKG     RADIOLOGY I independently reviewed and interpreted imaging and agree with radiologists findings.     PROCEDURES:  Critical Care performed:   Procedures   MEDICATIONS  ORDERED IN ED: Medications  enoxaparin  (LOVENOX ) injection 40 mg (has no administration in time range)  0.9 %  sodium chloride  infusion (has no administration in time range)  acetaminophen  (TYLENOL ) tablet 650 mg (has no administration in time range)    Or  acetaminophen  (TYLENOL ) suppository 650 mg (has no administration in time range)  oxyCODONE  (Oxy IR/ROXICODONE ) immediate release tablet 5 mg (has no administration in time range)  morphine  (PF) 2 MG/ML injection 2 mg (has no administration in time range)  ondansetron  (ZOFRAN ) tablet 4 mg (has no administration in time range)    Or  ondansetron  (ZOFRAN ) injection 4 mg (has no administration in time range)  pantoprazole  (PROTONIX ) injection 40 mg (has no administration in time range)  fluticasone  furoate-vilanterol (BREO ELLIPTA ) 200-25 MCG/ACT 1 puff (has no administration in time range)  ipratropium-albuterol  (DUONEB) 0.5-2.5 (3) MG/3ML nebulizer solution 3 mL (has no administration in time range)  hydrALAZINE  (APRESOLINE ) injection 10 mg (has no administration in time range)  piperacillin -tazobactam (ZOSYN ) IVPB 3.375 g (has no administration in time range)  iohexol  (OMNIPAQUE ) 300 MG/ML solution 100 mL (100 mLs Intravenous Contrast Given 03/11/24 1553)  piperacillin -tazobactam (ZOSYN ) IVPB 3.375 g (0 g Intravenous Stopped 03/11/24 1737)     IMPRESSION / MDM / ASSESSMENT AND PLAN / ED COURSE  I reviewed the  triage vital signs and the nursing notes.   Differential diagnosis includes, but is not limited to, diverticulitis, appendicitis, perforation, abscess  Patient is awake and alert, tachycardic to 119 with a soft blood pressure of 102/66, though afebrile.  He is tender in his low abdomen diffusely with guarding.  He was offered analgesia multiple times throughout his emergency department stay, however he declined each time.  Labs obtained in triage are revealing for leukocytosis to 14.7.  CT scan obtained reveals severe acute sigmoid diverticulitis with contained perforation along the inferior margin of the inflamed sigmoid colon measuring up to 5.1 cm in size without focal abscess.  There is also question of fat stranding between the inflamed sigmoid colon and dome of the bladder without definite colovesical fistula identified.  I started patient on Zosyn  and consulted general surgery who agrees with plan for continued Zosyn , though keep him n.p.o. at this time.  Dr. Marinda has requested hospitalist admit.  I consulted Dr. Roann with hospitalist who has accepted patient to his service.  Patient agrees with plan of care.   Patient's presentation is most consistent with acute presentation with potential threat to life or bodily function.  Clinical Course as of 03/11/24 1749  Mon Mar 11, 2024  1658 Discussed with Dr. Marinda, plan for hospital admission.  Keep him n.p.o., Zosyn  [JP]    Clinical Course User Index [JP] Vernecia Umble E, [name]     FINAL CLINICAL IMPRESSION(S) / ED DIAGNOSES   Final diagnoses:  Perforation bowel (HCC)  Diverticulitis     Rx / DC Orders   ED Discharge Orders     None        Note:  This document was prepared using Dragon voice recognition software and may include unintentional dictation errors.   Delores Thelen E, [name] 03/11/24 1749  "

## 2024-03-12 ENCOUNTER — Inpatient Hospital Stay

## 2024-03-12 ENCOUNTER — Ambulatory Visit: Admitting: Internal Medicine

## 2024-03-12 DIAGNOSIS — K5780 Diverticulitis of intestine, part unspecified, with perforation and abscess without bleeding: Secondary | ICD-10-CM

## 2024-03-12 DIAGNOSIS — R7301 Impaired fasting glucose: Secondary | ICD-10-CM | POA: Insufficient documentation

## 2024-03-12 LAB — PROTIME-INR
INR: 1.1 (ref 0.8–1.2)
Prothrombin Time: 15.2 s (ref 11.4–15.2)

## 2024-03-12 MED ORDER — TRAZODONE HCL 50 MG PO TABS: 25.0000 mg | ORAL_TABLET | Freq: Every evening | ORAL | Status: AC | PRN

## 2024-03-12 MED ORDER — ATORVASTATIN CALCIUM 20 MG PO TABS
40.0000 mg | ORAL_TABLET | Freq: Every morning | ORAL | Status: AC
  Administered 2024-03-12 – 2024-03-15 (×4): 40 mg via ORAL
  Filled 2024-03-12 (×4): qty 2

## 2024-03-12 MED ORDER — SODIUM CHLORIDE 0.9 % IV SOLN: INTRAVENOUS | Status: DC

## 2024-03-12 MED ORDER — LOSARTAN POTASSIUM 50 MG PO TABS
50.0000 mg | ORAL_TABLET | Freq: Every day | ORAL | Status: AC
  Administered 2024-03-12 – 2024-03-15 (×4): 50 mg via ORAL
  Filled 2024-03-12 (×4): qty 1

## 2024-03-12 MED ORDER — FINASTERIDE 5 MG PO TABS
5.0000 mg | ORAL_TABLET | Freq: Every day | ORAL | Status: AC
  Administered 2024-03-12 – 2024-03-15 (×4): 5 mg via ORAL
  Filled 2024-03-12 (×4): qty 1

## 2024-03-12 NOTE — Assessment & Plan Note (Signed)
 On Protonix 

## 2024-03-12 NOTE — Assessment & Plan Note (Signed)
 On Cozaar 

## 2024-03-12 NOTE — Assessment & Plan Note (Signed)
 On Lipitor

## 2024-03-12 NOTE — Assessment & Plan Note (Signed)
"  Does not wear CPAP.   "

## 2024-03-12 NOTE — Plan of Care (Signed)

## 2024-03-12 NOTE — Progress Notes (Signed)
 " Progress Note   Patient: [name]. FMW:[id number] DOB: 07-02-1955 DOA: 03/11/2024     1 DOS: the patient was seen and examined on 03/12/2024   Brief hospital course: 69 y.o. male with medical history significant for recurrent diverticulitis, HTN, HLD, COPD not on home oxygen who presented to ED complaining of abdominal pain for the last 2 days.  Patient stated that his pain was severe to the point he was not able to eat and drink.  He rates pain as 7/10 in intensity, mostly on the right lower quadrant, associated with some nausea no vomiting no diarrhea no hematemesis or melena.  He denies any fever or chills.   ED Course: Upon arrival to the ED, patient is found to have a leukocytosis at 14.7, CT scan of the abdomen and pelvis showed severe/acute sigmoid diverticulitis with evidence of contained perforation along the inferior margin of the inflamed sigmoid colon measuring up to 5.1 cm in size.  No free intraperitoneal gas.  Extensive fat stranding between the inflamed sigmoid colon and the dome of the bladder with no definite colovesical fistula identified.  General surgery was consulted who advised to admit under hospitalist service with IV antibiotics.  Patient was started on Zosyn  and hospital service was consulted for evaluation for admission.  2/3.  Patient still having abdominal pain.  Diagnosed with diverticular abscess.  Third episode with diverticulitis.  Assessment and Plan: * Diverticular disease of intestine with perforation and abscess Continue Zosyn .  Case discussed with general surgery.  Plan will be IV antibiotics and interventional team will evaluate to place a drain.  Patient will need surgery as outpatient in about 12 weeks.  Hypertension On Cozaar   Hyperlipidemia On Lipitor  COPD (chronic obstructive pulmonary disease) (HCC) Respiratory status stable  GERD (gastroesophageal reflux disease) On Protonix   Sleep apnea Does not wear CPAP  Impaired fasting  glucose Last hemoglobin A1c a couple months ago was 5.7.        Subjective: Patient coming in with abdominal pain.  Found to have a diverticular abscess.  Started on IV Zosyn .  Physical Exam: Vitals:   03/12/24 0336 03/12/24 0339 03/12/24 0722 03/12/24 0919  BP:  (!) 141/61  133/74  Pulse:  72  76  Resp:  16  16  Temp: 98.3 F (36.8 C)   98.7 F (37.1 C)  TempSrc: Oral     SpO2:  96%  97%  Weight:   90.7 kg   Height:   5' 10 (1.778 m)    Physical Exam HENT:     Head: Normocephalic.  Eyes:     General: Lids are normal.  Cardiovascular:     Rate and Rhythm: Normal rate and regular rhythm.     Heart sounds: Normal heart sounds, S1 normal and S2 normal.  Pulmonary:     Breath sounds: No decreased breath sounds, wheezing, rhonchi or rales.  Abdominal:     Palpations: Abdomen is soft.     Tenderness: There is abdominal tenderness in the suprapubic area and left lower quadrant.  Musculoskeletal:     Right lower leg: No swelling.     Left lower leg: No swelling.  Skin:    General: Skin is warm.     Findings: No rash.  Neurological:     Mental Status: He is alert and oriented to person, place, and time.     Data Reviewed: White blood cell count 14.2, lactic acid negative, creatinine 0.97  Family Communication: Family at bedside  Disposition: Status is: Inpatient Remains inpatient appropriate because: Continue IV antibiotics will need IR procedure for draining of abscess.  Planned Discharge Destination: Home    Time spent: 28 minutes  Author: Charlie Patterson, MD 03/12/2024 12:05 PM  For on call review [email or web address].  "

## 2024-03-12 NOTE — Assessment & Plan Note (Signed)
 Continue Zosyn .  Case discussed with general surgery.  Plan will be IV antibiotics and interventional team will evaluate to place a drain.  Patient will need surgery as outpatient in about 12 weeks.

## 2024-03-12 NOTE — Hospital Course (Signed)
 69 y.o. male with medical history significant for recurrent diverticulitis, HTN, HLD, COPD not on home oxygen who presented to ED complaining of abdominal pain for the last 2 days.  Patient stated that his pain was severe to the point he was not able to eat and drink.  He rates pain as 7/10 in intensity, mostly on the right lower quadrant, associated with some nausea no vomiting no diarrhea no hematemesis or melena.  He denies any fever or chills.   ED Course: Upon arrival to the ED, patient is found to have a leukocytosis at 14.7, CT scan of the abdomen and pelvis showed severe/acute sigmoid diverticulitis with evidence of contained perforation along the inferior margin of the inflamed sigmoid colon measuring up to 5.1 cm in size.  No free intraperitoneal gas.  Extensive fat stranding between the inflamed sigmoid colon and the dome of the bladder with no definite colovesical fistula identified.  General surgery was consulted who advised to admit under hospitalist service with IV antibiotics.  Patient was started on Zosyn  and hospital service was consulted for evaluation for admission.  2/3.  Patient still having abdominal pain.  Diagnosed with diverticular abscess.  Third episode with diverticulitis.

## 2024-03-12 NOTE — Assessment & Plan Note (Signed)
Respiratory status stable. 

## 2024-03-12 NOTE — ED Notes (Signed)
 Pt ambulatory to bathroom independent, gait steady.

## 2024-03-13 ENCOUNTER — Inpatient Hospital Stay

## 2024-03-13 DIAGNOSIS — K5780 Diverticulitis of intestine, part unspecified, with perforation and abscess without bleeding: Secondary | ICD-10-CM | POA: Diagnosis not present

## 2024-03-13 LAB — HIV ANTIBODY (ROUTINE TESTING W REFLEX): HIV Screen 4th Generation wRfx: NONREACTIVE

## 2024-03-13 MED ORDER — MIDAZOLAM HCL (PF) 2 MG/2ML IJ SOLN
INTRAMUSCULAR | Status: AC | PRN
  Administered 2024-03-13: 1 mg via INTRAVENOUS
  Administered 2024-03-13: 2 mg via INTRAVENOUS

## 2024-03-13 MED ORDER — MIDAZOLAM HCL 2 MG/2ML IJ SOLN
INTRAMUSCULAR | Status: AC
  Filled 2024-03-13: qty 2

## 2024-03-13 MED ORDER — FENTANYL CITRATE (PF) 100 MCG/2ML IJ SOLN
INTRAMUSCULAR | Status: AC
  Filled 2024-03-13: qty 2

## 2024-03-13 MED ORDER — FENTANYL CITRATE (PF) 100 MCG/2ML IJ SOLN
INTRAMUSCULAR | Status: AC | PRN
  Administered 2024-03-13 (×2): 50 ug via INTRAVENOUS

## 2024-03-13 MED ORDER — SODIUM CHLORIDE 0.9% FLUSH
5.0000 mL | Freq: Three times a day (TID) | INTRAVENOUS | Status: AC
  Administered 2024-03-13 – 2024-03-15 (×8): 5 mL

## 2024-03-13 MED ORDER — MIDAZOLAM HCL 2 MG/2ML IJ SOLN
INTRAMUSCULAR | Status: AC
  Filled 2024-03-13: qty 4

## 2024-03-13 MED ORDER — LIDOCAINE HCL (PF) 1 % IJ SOLN
10.0000 mL | Freq: Once | INTRAMUSCULAR | Status: AC
  Administered 2024-03-13: 10 mL via INTRADERMAL

## 2024-03-13 NOTE — Progress Notes (Signed)
 " PROGRESS NOTE    [name].  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 Continue on IV zosyn . Will have drain placed today as per ID. Will need surg outpatient in about 12 weeks as per gen surg   HTN: continue on losartan     HLD: continue on statin    COPD: w/o exacerbation. Bronchodilators prn    GERD: continue on PPI    Sleep apnea: does not use CPAP  Prediabetes: HbA1c 5.7. Needs prediabetes education      DVT prophylaxis: lovenox   Code Status: full  Family Communication: Disposition Plan: likely d/c back home  Status is: Inpatient Remains inpatient appropriate because: severity of illness    Level of care: Med-Surg Consultants:  Gen surg IR  Procedures:  Antimicrobials: zosyn     Subjective: Pt c/o abd pain   Objective: Vitals:   03/12/24 1731 03/12/24 2044 03/13/24 0328 03/13/24 0850  BP: 135/78 129/74 (!) 140/82 (!) 145/77  Pulse: 87 70 75 77  Resp: 16 18 18 18   Temp: 99.1 F (37.3 C) 98.8 F (37.1 C) 98.4 F (36.9 C) 98.4 F (36.9 C)  TempSrc:    Oral  SpO2: 99% 100% 95% 99%  Weight:      Height:        Intake/Output Summary (Last 24 hours) at 03/13/2024 1012 Last data filed at 03/13/2024 0600 Gross per 24 hour  Intake 1808.4 ml  Output --  Net 1808.4 ml   Filed Weights   03/12/24 0722  Weight: 90.7 kg    Examination:  General exam: Appears calm and comfortable  Respiratory system: Clear to auscultation. Respiratory effort normal. Cardiovascular system: S1 & S2+. No  rubs, gallops or clicks.  Gastrointestinal system: Abdomen is  nondistended, soft and tenderness to palpation. Hypoactive bowel sounds heard. Central nervous system: Alert and oriented. Moves all extremities  Psychiatry: Judgement and insight appear normal. Mood & affect appropriate.     Data Reviewed: I have personally reviewed following labs and imaging studies  CBC: Recent Labs  Lab 03/11/24 1350 03/11/24 2100  WBC 14.7* 14.2*  HGB 15.8 15.2  HCT 47.6 45.6  MCV 88.5 90.1  PLT 199 186   Basic Metabolic Panel: Recent Labs  Lab 03/11/24 1350 03/11/24 2100  NA 136  --   K 4.3  --   CL 100  --   CO2 24  --   GLUCOSE 135*  --   BUN 17  --   CREATININE 0.87 0.97  CALCIUM  9.6  --    GFR: Estimated Creatinine Clearance: 83.7 mL/min (by C-G formula based on SCr of 0.97 mg/dL). Liver Function Tests: Recent Labs  Lab 03/11/24 1350  AST 27  ALT 16  ALKPHOS 64  BILITOT 3.0*  PROT 7.0  ALBUMIN 4.0   Recent Labs  Lab 03/11/24 1350  LIPASE 22   No results for input(s): AMMONIA in the last 168 hours. Coagulation Profile: Recent Labs  Lab 03/12/24 1252  INR 1.1   Cardiac Enzymes: No results for input(s): CKTOTAL, CKMB, CKMBINDEX, TROPONINI in the last 168 hours. BNP (last 3 results) No results for input(s):  PROBNP in the last 8760 hours. HbA1C: No results for input(s): HGBA1C in the last 72 hours. CBG: No results for input(s): GLUCAP in the last 168 hours. Lipid Profile: No results for input(s): CHOL, HDL, LDLCALC, TRIG, CHOLHDL, LDLDIRECT in the last 72 hours. Thyroid  Function Tests: No results for input(s): TSH, T4TOTAL, FREET4, T3FREE, THYROIDAB in the last 72 hours. Anemia Panel: No results for input(s): VITAMINB12, FOLATE, FERRITIN, TIBC, IRON, RETICCTPCT in the last 72 hours. Sepsis Labs: Recent Labs  Lab 03/11/24 1511 03/11/24 2100  LATICACIDVEN 0.9 1.3    No results found for this or any previous visit (from the past 240 hours).       Radiology  Studies: CT ABDOMEN PELVIS W CONTRAST Result Date: 03/11/2024 CLINICAL DATA:  Severe abdominal pain, greatest on the right, nausea, constipation EXAM: CT ABDOMEN AND PELVIS WITH CONTRAST TECHNIQUE: Multidetector CT imaging of the abdomen and pelvis was performed using the standard protocol following bolus administration of intravenous contrast. RADIATION DOSE REDUCTION: This exam was performed according to the departmental dose-optimization program which includes automated exposure control, adjustment of the mA and/or kV according to patient size and/or use of iterative reconstruction technique. CONTRAST:  OMNIPAQUE  IOHEXOL  300 MG/ML  SOLN COMPARISON:  11/17/2023 FINDINGS: Lower chest: Bilateral lower lobe subsegmental atelectasis or scarring. No acute pleural or parenchymal lung disease. Hepatobiliary: No focal liver abnormality is seen. No gallstones, gallbladder wall thickening, or biliary dilatation. Pancreas: Unremarkable. No pancreatic ductal dilatation or surrounding inflammatory changes. Spleen: Normal in size without focal abnormality. Adrenals/Urinary Tract: Stable right renal cyst does not require imaging follow-up. The left kidney is unremarkable. No urinary tract calculi or obstructive uropathy. The adrenals are normal. Bladder is minimally distended, with mild diffuse bladder wall thickening. Findings could reflect cystitis given adjacent inflammatory changes from perforated diverticulitis. No evidence of vesicular fistula at this time. Stomach/Bowel: No bowel obstruction or ileus. Normal appendix right lower quadrant. Distal colonic diverticulosis is again noted, with marked wall thickening and pericolonic fat stranding involving the sigmoid colon consistent with acute diverticulitis. There is a contained perforation extending inferiorly from the inflamed sigmoid colon measuring 5.1 x 3.4 x 3.9 cm. Inflammatory changes extend along the dome of the bladder, but I do not see any evidence of  colovesical fistula at this time. Vascular/Lymphatic: Atherosclerosis of the abdominal aorta. No pathologic adenopathy. Reproductive: Prostate is unremarkable. Other: Trace reactive pelvic free fluid. No free intraperitoneal gas. No abdominal wall hernia. Musculoskeletal: No acute or destructive bony abnormalities. Reconstructed images demonstrate no additional findings. IMPRESSION: 1. Severe acute sigmoid diverticulitis, with evidence of contained perforation along the inferior margin of the inflamed sigmoid colon measuring up to 5.1 cm in size. No free intraperitoneal gas. 2. Extensive fat stranding between the inflamed sigmoid colon and dome of the bladder, with no definite colovesical fistula identified at this time. Nonspecific bladder wall thickening given decompressed state. Cystitis cannot be excluded. 3.  Aortic Atherosclerosis (ICD10-I70.0). Electronically Signed   By: Ozell Daring M.D.   On: 03/11/2024 16:18        Scheduled Meds:  atorvastatin   40 mg Oral q morning   enoxaparin  (LOVENOX ) injection  40 mg Subcutaneous Q24H   finasteride   5 mg Oral Daily   fluticasone  furoate-vilanterol  1 puff Inhalation Daily   losartan   50 mg Oral Daily   pantoprazole  (PROTONIX ) IV  40 mg Intravenous Q12H   Continuous Infusions:  sodium chloride  100 mL/hr at 03/13/24 9487   piperacillin -tazobactam (ZOSYN )  IV 3.375 g (03/13/24  0510)     LOS: 2 days       Anthony CHRISTELLA Pouch, MD [facility] Pager [phone]  If 7PM-7AM, please contact night-coverage [email or web address] 03/13/2024, 10:12 AM   "

## 2024-03-13 NOTE — Procedures (Signed)
 Interventional Radiology Procedure Note  Procedure: 14F drain into diverticular abscess with evacuation of 20 mL purulent fluid.  Sample sent for culture  Complications: None  Estimated Blood Loss: None  Recommendations: - Drain to JP x 24 hrs then switch to gravity bag to reduce fistula risk   Signed,  Wilkie LOIS Lent, MD

## 2024-03-13 NOTE — Progress Notes (Signed)
 MEDICATION-RELATED CONSULT NOTE   IR Procedure Consult - Anticoagulant/Antiplatelet PTA/Inpatient Med List Review by Pharmacist    Procedure: CT-guided peritoneal/retroperitoneal fluid drain    Completed: ~ 1400  Post-Procedural bleeding risk per IR MD assessment:  low  Antithrombotic medications on inpatient or PTA profile prior to procedure:   Lovenox  VTE ppx    Recommended restart time per IR Post-Procedure Guidelines:  Day 0 at least 4 hours after procedure   Other considerations:      Plan:     Resume Lovenox  40 mg daily starting this PM.   Bari Glendia BIRCH, PharmD, BCPS  03/13/24 3:27 PM

## 2024-03-13 NOTE — Progress Notes (Signed)
 CC: Perforated Diverticulitis Subjective: Patient admitted yesterday with perforated diverticulitis Continues to report abdominal pain in lower abdomen now extending upwards towards epigastrium No BMs  Objective: Vital signs in last 24 hours: Temp:  [98.3 F (36.8 C)-99.1 F (37.3 C)] 98.3 F (36.8 C) (02/04 1345) Pulse Rate:  [70-89] 80 (02/04 1345) Resp:  [16-25] 25 (02/04 1345) BP: (129-150)/(74-82) 150/79 (02/04 1345) SpO2:  [94 %-100 %] 98 % (02/04 1345) Last BM Date : 03/11/24  Intake/Output from previous day: 02/03 0701 - 02/04 0700 In: 1808.4 [I.V.:1681.1; IV Piggyback:127.4] Out: -  Intake/Output this shift: No intake/output data recorded.  Physical exam:  Abdomen is soft, slightly distended, tender to palpation over lower abdomen, no signs of peritontiis  Lab Results: CBC  Recent Labs    03/11/24 1350 03/11/24 2100  WBC 14.7* 14.2*  HGB 15.8 15.2  HCT 47.6 45.6  PLT 199 186   BMET Recent Labs    03/11/24 1350 03/11/24 2100  NA 136  --   K 4.3  --   CL 100  --   CO2 24  --   GLUCOSE 135*  --   BUN 17  --   CREATININE 0.87 0.97  CALCIUM  9.6  --    PT/INR Recent Labs    03/12/24 1252  LABPROT 15.2  INR 1.1   ABG No results for input(s): PHART, HCO3 in the last 72 hours.  Invalid input(s): PCO2, PO2  Studies/Results: CT ABDOMEN PELVIS W CONTRAST Result Date: 03/11/2024 CLINICAL DATA:  Severe abdominal pain, greatest on the right, nausea, constipation EXAM: CT ABDOMEN AND PELVIS WITH CONTRAST TECHNIQUE: Multidetector CT imaging of the abdomen and pelvis was performed using the standard protocol following bolus administration of intravenous contrast. RADIATION DOSE REDUCTION: This exam was performed according to the departmental dose-optimization program which includes automated exposure control, adjustment of the mA and/or kV according to patient size and/or use of iterative reconstruction technique. CONTRAST:  OMNIPAQUE  IOHEXOL   300 MG/ML  SOLN COMPARISON:  11/17/2023 FINDINGS: Lower chest: Bilateral lower lobe subsegmental atelectasis or scarring. No acute pleural or parenchymal lung disease. Hepatobiliary: No focal liver abnormality is seen. No gallstones, gallbladder wall thickening, or biliary dilatation. Pancreas: Unremarkable. No pancreatic ductal dilatation or surrounding inflammatory changes. Spleen: Normal in size without focal abnormality. Adrenals/Urinary Tract: Stable right renal cyst does not require imaging follow-up. The left kidney is unremarkable. No urinary tract calculi or obstructive uropathy. The adrenals are normal. Bladder is minimally distended, with mild diffuse bladder wall thickening. Findings could reflect cystitis given adjacent inflammatory changes from perforated diverticulitis. No evidence of vesicular fistula at this time. Stomach/Bowel: No bowel obstruction or ileus. Normal appendix right lower quadrant. Distal colonic diverticulosis is again noted, with marked wall thickening and pericolonic fat stranding involving the sigmoid colon consistent with acute diverticulitis. There is a contained perforation extending inferiorly from the inflamed sigmoid colon measuring 5.1 x 3.4 x 3.9 cm. Inflammatory changes extend along the dome of the bladder, but I do not see any evidence of colovesical fistula at this time. Vascular/Lymphatic: Atherosclerosis of the abdominal aorta. No pathologic adenopathy. Reproductive: Prostate is unremarkable. Other: Trace reactive pelvic free fluid. No free intraperitoneal gas. No abdominal wall hernia. Musculoskeletal: No acute or destructive bony abnormalities. Reconstructed images demonstrate no additional findings. IMPRESSION: 1. Severe acute sigmoid diverticulitis, with evidence of contained perforation along the inferior margin of the inflamed sigmoid colon measuring up to 5.1 cm in size. No free intraperitoneal gas. 2. Extensive fat stranding between the  inflamed sigmoid colon  and dome of the bladder, with no definite colovesical fistula identified at this time. Nonspecific bladder wall thickening given decompressed state. Cystitis cannot be excluded. 3.  Aortic Atherosclerosis (ICD10-I70.0). Electronically Signed   By: Ozell Daring M.D.   On: 03/11/2024 16:18    Anti-infectives: Anti-infectives (From admission, onward)    Start     Dose/Rate Route Frequency Ordered Stop   03/12/24 0600  piperacillin -tazobactam (ZOSYN ) IVPB 3.375 g  Status:  Discontinued        3.375 g 12.5 mL/hr over 240 Minutes Intravenous Every 8 hours 03/11/24 1716 03/11/24 1722   03/12/24 0600  piperacillin -tazobactam (ZOSYN ) IVPB 3.375 g        3.375 g 12.5 mL/hr over 240 Minutes Intravenous Every 8 hours 03/11/24 1727     03/11/24 1645  piperacillin -tazobactam (ZOSYN ) IVPB 3.375 g        3.375 g 100 mL/hr over 30 Minutes Intravenous  Once 03/11/24 1642 03/11/24 1737       Assessment/Plan:  Patient with perforated diverticulitis, going for IR drain today Continue abx Okay for sips of clear liquids after procedure but would not order clear liquid diet Will order labs in AM  35 minutes spent reviewing chart, performing interval H and P and discussing tx options with patient  Jayson Endow, M.D. [facility]

## 2024-03-13 NOTE — TOC CM/SW Note (Signed)
 Transition of Care [facility]) - Inpatient Brief Assessment   Patient Details  Name: [name]. MRN: [id number] Date of Birth: Nov 04, 1955  Transition of Care [facility]) CM/SW Contact:    Corean ONEIDA Haddock, RN Phone Number: 03/13/2024, 11:00 AM   Clinical Narrative:  Transition of Care Department [facility]) has reviewed patient and no TOC needs have been identified at this time.  If new patient transition needs arise, please place a TOC consult.   Per sdoh utility resources added to AVS  Transition of Care Asessment: Insurance and Status: Insurance coverage has been reviewed Patient has primary care physician: Yes     Prior/Current Home Services: No current home services Social Drivers of Health Review: SDOH reviewed interventions complete Readmission risk has been reviewed: Yes Transition of care needs: no transition of care needs at this time

## 2024-03-13 NOTE — Discharge Instructions (Signed)
 Agency Name: [facility] Agency Address: [address], [place], [place] [address] Phone: [phone] Website: [email or web address] Service(s) Offered: Housing services, self-sufficiency, congregate meal program, and individual development account program.  Agency Name: [employer] [facility] Address: [address], [place], [place] [address] Phone: [phone] Email: [email or web address] Website: [email or web address] Service(s) Offered: Housing the homeless, feeding the hungry, company secretary, job and education related services.  Agency Name: [facility] Address: [address] [place], [place] [address] Phone: [phone] Email: [email or web address] Service(s) Offered: Counseling, problem pregnancy, advocacy for Hispanics, limited emergency financial assistance.  Agency Name: [employer] Address: [address] [place], [place] [address] Phone: [phone] Website: [email or web address] Service(s) Offered: Child support services; child welfare services; SNAP; Medicaid; work first family assistance; and aid with fuel,  rent, food and medicine.  Agency Name: [employer] Address: [address] [place], [place] [address] Phone: [phone] or [phone] Email: [email or web address] Service(s) Offered: Family services and transient assistance; emergency food, fuel, clothing, limited furniture, utilities; budget counseling, general counseling; give a kid a coat; thrift store; Christmas food and toys. Utility assistance, food pantry, rental  assistance, life sustaining medicine       Interventional Radiology Percutaneous Abscess Drain Placement After Care   This sheet gives you information about how to care for yourself after your procedure. Your health care provider may also give you more specific instructions. Your drain was placed by an interventional  radiologist with [facility]. If you have questions or concerns, contact [facility] at [phone].   What is a percutaneous drain?   A drain is a small plastic tube (catheter) that goes into the fluid collection in your body through your skin.   How long will I need the drain?   How long the drain needs to stay in is determined by where the drain is, how much comes out of the drain each day and if you are having any other surgical procedures.   Interventional radiology will determine when it is time to remove the drain. It is important to follow up as directed so that the drain can be removed as soon as it is safe to do so.   What can I expect after the procedure?   After the procedure, it is common to have:   A small amount of bruising and discomfort in the area where the drainage tube (catheter) was placed.   Sleepiness and fatigue. This should go away after the medicines you were given have worn off.   Follow these instructions at home:   Insertion site care   Check your insertion site when you change the bandage. Check for:   More redness, swelling, or pain.   More fluid or blood.   Warmth.   Pus or a bad smell.   When caring for your insertion site:   Wash your hands with soap and water  for at least 20 seconds before and after you change your bandage (dressing). If soap and water  are not available, use hand sanitizer.   You do not need to change your dressing everyday if it is clean and dry. Change your dressing every 3 days or as needed when it is soiled, wet or becoming dislodged. You will need to change your dressing each time you shower.   Leave stitches (sutures), skin glue, or adhesive strips in place. These skin closures may need to stay in place for 2 weeks or longer. If adhesive strip edges start to loosen and  curl up, you may trim the loose edges. Do not remove adhesive strips completely unless your health care provider tells you to do so.    Catheter care   Flush the catheter once per day with 5 mL of 0.9% normal saline unless you are told otherwise by your healthcare provider. This helps to prevent clogs in the catheter.   To disconnect the drain, turn the clear plastic tube to the left. Attach the saline syringe by placing it on the white end of the drain and turning gently to the right. Once attached gently push the plunger to the 5 mL mark. After you are done flushing, disconnect the syringe by turning to the left and reattach your drainage container   If you have a bulb please be sure the bulb is charged after reconnecting it - to do this pinch the bulb between your thumb and first finger and close the stopper located on the top of the bulb.    Check for fluid leaking from around your catheter (instead of fluid draining through your catheter). This may be a sign that the drain is no longer working correctly.   Write down the following information every time you empty your bag:   The date and time.   The amount of drainage.   Activity   Rest at home for 1-2 days after your procedure.   For the first 48 hours do not lift anything more than 10 lbs (about a gallon of milk). You may perform moderate activities/exercise. Please avoid strenuous activities during this time.   Avoid any activities which may pull on your drain as this can cause your drain to become dislodged.   If you were given a sedative during the procedure, it can affect you for several hours. Do not drive or operate machinery until your health care provider says that it is safe.   General instructions   For mild pain take over-the-counter medications as needed for pain such as Tylenol  or Advil . If you are experiencing severe pain please call our office as this may indicate an issue with your drain.    If you were prescribed an antibiotic medicine, take it as told by your health care provider. Do not stop using the antibiotic even if you start to feel  better.   You may shower 24 hours after the drain is placed. To do this cover the insertion site with a water  tight material such as saran wrap and seal the edges with tape, you may also purchase waterproof dressings at your local drug store. Shower as usual and then remove the water  tight dressing and any gauze/tape underneath it once you have exited the shower and dried off. Allow the area to air dry or pat dry with a clean towel. Once the skin is completely dry place a new gauze dressing. It is important to keep the site dry at all times to prevent infection.   Do not submerge the drain - this means you cannot take baths, swim, use a hot tub, etc. until the drain is removed.    Do not use any products that contain nicotine or tobacco, such as cigarettes, e-cigarettes, and chewing tobacco. If you need help quitting, ask your health care provider.   Keep all follow-up visits as told by your health care provider. This is important.   Contact a health care provider if:   You have less than 10 mL of drainage a day for 2-3 days in a row, or as  directed by your health care provider.   You have any of these signs of infection:   More redness, swelling, or pain around your incision area.   More fluid or blood coming from your incision area.   Warmth coming from your incision area.   Pus or a bad smell coming from your incision area.   You have fluid leaking from around your catheter (instead of through your catheter).   You are unable to flush the drain.   You have a fever or chills.   You have pain that does not get better with medicine.   You have not been contacted to schedule a drain follow up appointment within 10 days of discharge from the hospital.   Please call [facility] at [phone] with any questions or concerns.   Get help right away if:   Your catheter comes out.   You suddenly stop having drainage from your catheter.   You suddenly have blood in the  fluid that is draining from your catheter.   You become dizzy or you faint.   You develop a rash.   You have nausea or vomiting.   You have difficulty breathing or you feel short of breath.   You develop chest pain.   You have problems with your speech or vision.   You have trouble balancing or moving your arms or legs.   Summary   It is common to have a small amount of bruising and discomfort in the area where the drainage tube (catheter) was placed. You may also have minor discomfort with movement while the drain is in place.   Flush the drain once per day with 5 mL of 0.9% normal saline (unless you were told otherwise by your healthcare provider).    Record the amount of drainage from the bag every time you empty it.   Change the dressing every 3 days or earlier if soiled/wet. Keep the skin dry under the dressing.   You may shower with the drain in place. Do not submerge the drain (no baths, swimming, hot tubs, etc.).   Contact [facility] at [phone] if you have more redness, swelling, or pain around your incision area or if you have pain that does not get better with medicine.   This information is not intended to replace advice given to you by your health care provider. Make sure you discuss any questions you have with your health care provider.   Document Revised: 04/29/2021 Document Reviewed: 01/19/2019   Elsevier Patient Education  2023 Elsevier Inc[employer]         Interventional Radiology Drain Record   Empty your drain at least once per day. You may empty it as often as needed. Use this form to write down the amount of fluid that has collected in the drainage container. Bring this form with you to your follow-up visits. Please call [facility] at [phone] with any questions or concerns prior to your appointment.   Drain #1 location: ___________________   Date __________ Time __________ Amount __________   Date __________ Time  __________ Amount __________   Date __________ Time __________ Amount __________   Date __________ Time __________ Amount __________   Date __________ Time __________ Amount __________   Date __________ Time __________ Amount __________   Date __________ Time __________ Amount __________   Date __________ Time __________ Amount __________   Date __________ Time __________ Amount __________   Date __________ Time __________ Amount __________  Date __________ Time __________ Amount __________   Date __________ Time __________ Amount __________   Date __________ Time __________ Amount __________   Date __________ Time __________ Amount __________

## 2024-03-14 DIAGNOSIS — K5780 Diverticulitis of intestine, part unspecified, with perforation and abscess without bleeding: Secondary | ICD-10-CM | POA: Diagnosis not present

## 2024-03-14 DIAGNOSIS — K5792 Diverticulitis of intestine, part unspecified, without perforation or abscess without bleeding: Secondary | ICD-10-CM

## 2024-03-14 LAB — CBC WITH DIFFERENTIAL/PLATELET
Abs Immature Granulocytes: 0.05 10*3/uL (ref 0.00–0.07)
Basophils Absolute: 0 10*3/uL (ref 0.0–0.1)
Basophils Relative: 0 %
Eosinophils Absolute: 0.2 10*3/uL (ref 0.0–0.5)
Eosinophils Relative: 3 %
HCT: 39.9 % (ref 39.0–52.0)
Hemoglobin: 13.2 g/dL (ref 13.0–17.0)
Immature Granulocytes: 1 %
Lymphocytes Relative: 12 %
Lymphs Abs: 1 10*3/uL (ref 0.7–4.0)
MCH: 29.7 pg (ref 26.0–34.0)
MCHC: 33.1 g/dL (ref 30.0–36.0)
MCV: 89.9 fL (ref 80.0–100.0)
Monocytes Absolute: 0.7 10*3/uL (ref 0.1–1.0)
Monocytes Relative: 8 %
Neutro Abs: 6.6 10*3/uL (ref 1.7–7.7)
Neutrophils Relative %: 76 %
Platelets: 189 10*3/uL (ref 150–400)
RBC: 4.44 MIL/uL (ref 4.22–5.81)
RDW: 13.8 % (ref 11.5–15.5)
WBC: 8.6 10*3/uL (ref 4.0–10.5)
nRBC: 0 % (ref 0.0–0.2)

## 2024-03-14 MED ORDER — SODIUM CHLORIDE FLUSH 0.9 % IV SOLN
INTRAVENOUS | 0 refills | Status: AC
  Filled 2024-03-14: qty 600, 30d supply, fill #0

## 2024-03-14 NOTE — Progress Notes (Signed)
" °  IR BRIEF PROGRESS NOTE:  IR was informed of likely patient discharge in upcoming days. Outpatient drain follow-up orders are placed and details available on discharge paperwork.   Electronically Signed: Carlin DELENA Griffon, [name] 03/14/2024, 10:28 PM      "

## 2024-03-14 NOTE — Care Management Important Message (Signed)
 Important Message  Patient Details  Name: [name]. MRN: [id number] Date of Birth: 11/22/1955   Important Message Given:  Yes - Medicare IM     [name] 03/14/2024, 11:45 AM

## 2024-03-14 NOTE — Progress Notes (Signed)
 " PROGRESS NOTE    [name].  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 Continue on IV zosyn . S/p drain placed on 03/13/24 . Will need surg outpatient in about 12 weeks as per gen surg   HTN: continue on losartan     HLD: continue on statin     COPD: w/o exacerbation. Bronchodilators prn    GERD: continue on PPI     Sleep apnea: does not use CPAP  Prediabetes: HbA1c 5.7. Needs prediabetes education      DVT prophylaxis: lovenox   Code Status: full  Family Communication: Disposition Plan: likely d/c back home  Status is: Inpatient Remains inpatient appropriate because: severity of illness    Level of care: Med-Surg Consultants:  Gen surg IR  Procedures:  Antimicrobials: zosyn     Subjective: Pt c/o intermittent abd pain   Objective: Vitals:   03/13/24 1727 03/13/24 2108 03/14/24 0356 03/14/24 0834  BP: (!) 156/77 (!) 152/78 (!) 140/86 (!) 142/74  Pulse: 76 78 68 (!) 55  Resp: 14 18 17 16   Temp: 98.4 F (36.9 C) 98.3 F (36.8 C) 98.6 F (37 C) 98.7 F (37.1 C)  TempSrc: Oral     SpO2: 98% 98% 99% 100%  Weight:      Height:        Intake/Output Summary (Last 24 hours) at 03/14/2024 1002 Last data filed at 03/14/2024 0536 Gross per 24 hour  Intake 971.67 ml  Output 50 ml  Net 921.67 ml   Filed Weights   03/12/24 0722  Weight: 90.7 kg    Examination:  General exam: Appears comfortable  Respiratory system: clear breath sounds b/l  Cardiovascular system: S1/S2+. No rubs or clicks  Gastrointestinal system: abd is soft, tenderness to  palpation, ND, hypoactive bowel sounds  Central nervous system: alert & oriented. Moves all extremities  Psychiatry: Judgement and insight appears at baseline. Appropriate mood and affect     Data Reviewed: I have personally reviewed following labs and imaging studies  CBC: Recent Labs  Lab 03/11/24 1350 03/11/24 2100 03/14/24 0626  WBC 14.7* 14.2* 8.6  NEUTROABS  --   --  6.6  HGB 15.8 15.2 13.2  HCT 47.6 45.6 39.9  MCV 88.5 90.1 89.9  PLT 199 186 189   Basic Metabolic Panel: Recent Labs  Lab 03/11/24 1350 03/11/24 2100  NA 136  --   K 4.3  --   CL 100  --   CO2 24  --   GLUCOSE 135*  --   BUN 17  --   CREATININE 0.87 0.97  CALCIUM  9.6  --    GFR: Estimated Creatinine Clearance: 83.7 mL/min (by C-G formula based on SCr of 0.97 mg/dL). Liver Function Tests: Recent Labs  Lab 03/11/24 1350  AST 27  ALT 16  ALKPHOS 64  BILITOT 3.0*  PROT 7.0  ALBUMIN 4.0   Recent Labs  Lab 03/11/24 1350  LIPASE 22   No results for input(s): AMMONIA in the last 168 hours. Coagulation Profile: Recent Labs  Lab 03/12/24 1252  INR 1.1   Cardiac Enzymes: No results for input(s):  CKTOTAL, CKMB, CKMBINDEX, TROPONINI in the last 168 hours. BNP (last 3 results) No results for input(s): PROBNP in the last 8760 hours. HbA1C: No results for input(s): HGBA1C in the last 72 hours. CBG: No results for input(s): GLUCAP in the last 168 hours. Lipid Profile: No results for input(s): CHOL, HDL, LDLCALC, TRIG, CHOLHDL, LDLDIRECT in the last 72 hours. Thyroid  Function Tests: No results for input(s): TSH, T4TOTAL, FREET4, T3FREE, THYROIDAB in the last 72 hours. Anemia Panel: No results for input(s): VITAMINB12, FOLATE, FERRITIN, TIBC, IRON, RETICCTPCT in the last 72 hours. Sepsis Labs: Recent Labs  Lab 03/11/24 1511 03/11/24 2100  LATICACIDVEN 0.9 1.3    Recent Results (from the past 240 hours)  Aerobic/Anaerobic Culture w  Gram Stain (surgical/deep wound)     Status: None (Preliminary result)   Collection Time: 03/13/24  2:58 PM   Specimen: Abscess  Result Value Ref Range Status   Specimen Description   Final    ABSCESS Performed at [facility], [address], [place], [place] [address]    Special Requests   Final    NONE Performed at [facility], [address] [place], [place] [address]    Gram Stain   Final    MODERATE WBC PRESENT,BOTH PMN AND MONONUCLEAR FEW GRAM NEGATIVE RODS RARE GRAM POSITIVE COCCI Performed at [facility], [address], [place], [place] [address]    Culture JEOFFREY SETTLE NEGATIVE RODS  Final   Report Status PENDING  Incomplete         Radiology Studies: CT GUIDED PERITONEAL/RETROPERITONEAL FLUID DRAIN BY PERC CATH Result Date: 03/13/2024 INDICATION: 69 year old male with diverticulitis complicated by diverticular abscess. EXAM: CT-guided drain placement MEDICATIONS: The patient is currently admitted to the hospital and receiving intravenous antibiotics. The antibiotics were administered within an appropriate time frame prior to the initiation of the procedure. ANESTHESIA/SEDATION: Moderate (conscious) sedation was employed during this procedure. A total of Versed  3 mg and Fentanyl  100 mcg was administered intravenously by the radiology nurse. Total intra-service moderate Sedation Time: 16 minutes. The patient's level of consciousness and vital signs were monitored continuously by radiology nursing throughout the procedure under my direct supervision. COMPLICATIONS: None immediate. PROCEDURE: Informed written consent was obtained from the patient after a thorough discussion of the procedural risks, benefits and alternatives. All questions were addressed. Maximal Sterile Barrier Technique was utilized including caps, mask, sterile gowns, sterile gloves, sterile drape, hand hygiene and skin antiseptic. A timeout was performed prior to the initiation of  the procedure. A planning axial CT scan was performed. The pericolonic abscess is successfully identified. A suitable skin entry site was selected and marked. The skin was sterilely prepped and draped in the standard fashion using chlorhexidine  skin prep. Local anesthesia was attained by infiltration with 1% lidocaine . A small dermatotomy was made. Under intermittent CT guidance, an 18 gauge trocar needle was advanced from a suprapubic approach into the fluid and gas collection. A 0.035 wire was then advanced and coiled. The introducer needle was removed. The percutaneous tract was dilated to 12 French. A 12 French drainage catheter was then advanced over the wire and formed. Aspiration yields 20 mL thick purulent fluid. The abscess cavity was then lavaged with 20 mL sterile saline and connected to JP bulb suction. Post drain placement CT imaging demonstrates a well-positioned drainage catheter and evacuation of the abscess. No evidence of complication. The drainage catheter was secured to the skin with 0 Prolene suture. A sterile bandage was applied. IMPRESSION:  Placement of a 12 French drainage catheter into the diverticular abscess via a suprapubic approach. Evacuation of 20 mL thick purulent fluid. Samples were sent for Gram stain and culture. PLAN: 1. Drain to JP bulb suction for 24 hours. Drain should then be transitioned to gravity bag to minimize risk of fistula formation. 2. Flush drainage catheter t.i.d. Electronically Signed   By: Wilkie Lent M.D.   On: 03/13/2024 16:09        Scheduled Meds:  atorvastatin   40 mg Oral q morning   enoxaparin  (LOVENOX ) injection  40 mg Subcutaneous Q24H   finasteride   5 mg Oral Daily   fluticasone  furoate-vilanterol  1 puff Inhalation Daily   losartan   50 mg Oral Daily   pantoprazole  (PROTONIX ) IV  40 mg Intravenous Q12H   sodium chloride  flush  5 mL Intracatheter Q8H   Continuous Infusions:  sodium chloride  100 mL/hr at 03/13/24 1736    piperacillin -tazobactam (ZOSYN )  IV 3.375 g (03/14/24 0532)     LOS: 3 days       Anthony CHRISTELLA Pouch, MD [facility] Pager [phone]  If 7PM-7AM, please contact night-coverage [email or web address] 03/14/2024, 10:02 AM   "

## 2024-03-14 NOTE — Progress Notes (Addendum)
 "   Referring Physician(s): Tyler Endow, MD  Supervising Physician: [name]  Patient Status:  [name] - In-pt  Chief Complaint: diverticular abscess s/p drain placement on 2/4  Brief HPI: [name]. is Ayala 69 y.o. male with Ayala past medical history of recurrent diverticulitis, GERD, sleep apnea and COPD, not on home oxygen, who presented to [facility] on 03/11/2024 with lower abdominal pain for the last few days. Patient reported that he has not had anything to eat or drink over several days. His pain is predominately in his right lower quadrant with 7/10 pain. He has had some nausea but no vomiting or diarrhea.    CT abdomen performed concerning for Ayala severe acute sigmoid diverticulitis, with evidence of contained perforation along the inferior margin of the inflamed sigmoid colon measuring up to 5.1 cm in size with extensive fat stranding between the inflamed sigmoid colon and dome of the bladder, with no definite colovesical fistula identified at this time. General surgery was consulted and advised primary team to monitor with IV antibiotics. IR consulted for possible drain placement on 2/3. Dr. Karalee placed drain on 2/4. He noted purulent fluid from the abscess and sent Ayala sample off for culture which revealed on the pre-lim report gram stain - moderate WBC, few gram negative rods, rare gram positive cocci with culture growing abundant gram negative rods.   Subjective: Patient resting comfortably in the hospital bed on arrival. He reported having some intermittent abdominal pain in his lower abdomen around insertion site that radiates to the left. He is on Ayala thin liquid diet and reported that he hasn't had much of an appetite.   Allergies: Other  Medications: Prior to Admission medications  Medication Sig Start Date End Date Taking? Authorizing Provider  ADVAIR  HFA 115-21 MCG/ACT inhaler USE 2 INHALATIONS BY MOUTH TWICE DAILY Patient taking differently: Inhale 2 puffs into the lungs  daily. 01/27/23  Yes Tyler Camellia MATSU, MD  albuterol  (VENTOLIN  HFA) 108 (90 Base) MCG/ACT inhaler Inhale 2 puffs into the lungs every 4 (four) hours as needed for wheezing or shortness of breath. 12/28/22  Yes Kasa, Kurian, MD  atorvastatin  (LIPITOR) 40 MG tablet Take 1 tablet (40 mg total) by mouth every morning. 01/18/24  Yes Tyler App, NP  finasteride  (PROSCAR ) 5 MG tablet Take 1 tablet (5 mg total) by mouth daily. 11/09/23  Yes McGowan, Clotilda A, [name]  losartan  (COZAAR ) 50 MG tablet Take 1 tablet (50 mg total) by mouth daily. 01/18/24  Yes Tyler App, NP  Multiple Vitamin (MULTIVITAMIN WITH MINERALS) TABS tablet Take 1 tablet by mouth in the morning.   Yes [provider]  pantoprazole  (PROTONIX ) 40 MG tablet Take 1 tablet (40 mg total) by mouth daily. 01/18/24  Yes Tyler App, NP  Plecanatide  (TRULANCE ) 3 MG TABS Take 1 tablet (3 mg total) by mouth daily as needed (constipation). 07/20/23  Yes Tyler App, NP  polyethylene glycol (MIRALAX ) 17 g packet Take 17 g by mouth daily. 11/17/23  Yes Tyler Rolland BRAVO, MD  senna (SENOKOT) 8.6 MG tablet Take 2 tablets by mouth daily with supper.   Yes [provider]  traZODone  (DESYREL ) 50 MG tablet Take 0.5-1 tablets (25-50 mg total) by mouth at bedtime as needed for sleep. 01/19/24  Yes Tyler App, NP  tadalafil  (CIALIS ) 5 MG tablet TAKE 1 TABLET BY MOUTH DAILY Patient not taking: Reported on 03/11/2024 03/31/23   Tyler Clotilda LABOR, [name]    Drain Location: suprapubic Size: Fr size: 12  Fr Date of placement: 03/13/2024  Currently to: Drain collection device: gravity 24 hour output:  Output by Drain (mL) 03/12/24 0701 - 03/12/24 1900 03/12/24 1901 - 03/13/24 0700 03/13/24 0701 - 03/13/24 1900 03/13/24 1901 - 03/14/24 0700 03/14/24 0701 - 03/14/24 1428  Closed System Drain RLQ   25 25      Vital Signs: BP (!) 142/74   Pulse (!) 55   Temp 98.7 F (37.1 C)   Resp 16   Ht 5' 10 (1.778 m)   Wt 200 lb (90.7 kg)   SpO2  100%   BMI 28.70 kg/m   Physical Exam Cardiovascular:     Rate and Rhythm: Normal rate.  Pulmonary:     Effort: Pulmonary effort is normal.  Abdominal:     General: Abdomen is flat.     Palpations: Abdomen is soft.     Comments: Dressing clean and dry. Suprapubic drain with suture intact. No redness or swelling around insertion site. Tenderness to palpation around insertion site. ~5cc serosanguinous fluid in bulb suction.   Skin:    General: Skin is warm and dry.  Neurological:     Mental Status: He is alert and oriented to person, place, and time.  Psychiatric:        Mood and Affect: Mood normal.        Behavior: Behavior normal.     Imaging: CT GUIDED PERITONEAL/RETROPERITONEAL FLUID DRAIN BY PERC CATH Result Date: 03/13/2024 INDICATION: 69 year old male with diverticulitis complicated by diverticular abscess. EXAM: CT-guided drain placement MEDICATIONS: The patient is currently admitted to the hospital and receiving intravenous antibiotics. The antibiotics were administered within an appropriate time frame prior to the initiation of the procedure. ANESTHESIA/SEDATION: Moderate (conscious) sedation was employed during this procedure. Ayala total of Versed  3 mg and Fentanyl  100 mcg was administered intravenously by the radiology nurse. Total intra-service moderate Sedation Time: 16 minutes. The patient's level of consciousness and vital signs were monitored continuously by radiology nursing throughout the procedure under my direct supervision. COMPLICATIONS: None immediate. PROCEDURE: Informed written consent was obtained from the patient after Ayala thorough discussion of the procedural risks, benefits and alternatives. All questions were addressed. Maximal Sterile Barrier Technique was utilized including caps, mask, sterile gowns, sterile gloves, sterile drape, hand hygiene and skin antiseptic. Ayala timeout was performed prior to the initiation of the procedure. Ayala planning axial CT scan was  performed. The pericolonic abscess is successfully identified. Ayala suitable skin entry site was selected and marked. The skin was sterilely prepped and draped in the standard fashion using chlorhexidine  skin prep. Local anesthesia was attained by infiltration with 1% lidocaine . Ayala small dermatotomy was made. Under intermittent CT guidance, an 18 gauge trocar needle was advanced from Ayala suprapubic approach into the fluid and gas collection. Ayala 0.035 wire was then advanced and coiled. The introducer needle was removed. The percutaneous tract was dilated to 12 French. Ayala 12 French drainage catheter was then advanced over the wire and formed. Aspiration yields 20 mL thick purulent fluid. The abscess cavity was then lavaged with 20 mL sterile saline and connected to JP bulb suction. Post drain placement CT imaging demonstrates Ayala well-positioned drainage catheter and evacuation of the abscess. No evidence of complication. The drainage catheter was secured to the skin with 0 Prolene suture. Ayala sterile bandage was applied. IMPRESSION: Placement of Ayala 12 French drainage catheter into the diverticular abscess via Ayala suprapubic approach. Evacuation of 20 mL thick purulent fluid. Samples were sent for Gram stain  and culture. PLAN: 1. Drain to JP bulb suction for 24 hours. Drain should then be transitioned to gravity bag to minimize risk of fistula formation. 2. Flush drainage catheter t.i.d. Electronically Signed   By: Wilkie Lent M.D.   On: 03/13/2024 16:09   CT ABDOMEN PELVIS W CONTRAST Result Date: 03/11/2024 CLINICAL DATA:  Severe abdominal pain, greatest on the right, nausea, constipation EXAM: CT ABDOMEN AND PELVIS WITH CONTRAST TECHNIQUE: Multidetector CT imaging of the abdomen and pelvis was performed using the standard protocol following bolus administration of intravenous contrast. RADIATION DOSE REDUCTION: This exam was performed according to the departmental dose-optimization program which includes automated exposure  control, adjustment of the mA and/or kV according to patient size and/or use of iterative reconstruction technique. CONTRAST:  OMNIPAQUE  IOHEXOL  300 MG/ML  SOLN COMPARISON:  11/17/2023 FINDINGS: Lower chest: Bilateral lower lobe subsegmental atelectasis or scarring. No acute pleural or parenchymal lung disease. Hepatobiliary: No focal liver abnormality is seen. No gallstones, gallbladder wall thickening, or biliary dilatation. Pancreas: Unremarkable. No pancreatic ductal dilatation or surrounding inflammatory changes. Spleen: Normal in size without focal abnormality. Adrenals/Urinary Tract: Stable right renal cyst does not require imaging follow-up. The left kidney is unremarkable. No urinary tract calculi or obstructive uropathy. The adrenals are normal. Bladder is minimally distended, with mild diffuse bladder wall thickening. Findings could reflect cystitis given adjacent inflammatory changes from perforated diverticulitis. No evidence of vesicular fistula at this time. Stomach/Bowel: No bowel obstruction or ileus. Normal appendix right lower quadrant. Distal colonic diverticulosis is again noted, with marked wall thickening and pericolonic fat stranding involving the sigmoid colon consistent with acute diverticulitis. There is Ayala contained perforation extending inferiorly from the inflamed sigmoid colon measuring 5.1 x 3.4 x 3.9 cm. Inflammatory changes extend along the dome of the bladder, but I do not see any evidence of colovesical fistula at this time. Vascular/Lymphatic: Atherosclerosis of the abdominal aorta. No pathologic adenopathy. Reproductive: Prostate is unremarkable. Other: Trace reactive pelvic free fluid. No free intraperitoneal gas. No abdominal wall hernia. Musculoskeletal: No acute or destructive bony abnormalities. Reconstructed images demonstrate no additional findings. IMPRESSION: 1. Severe acute sigmoid diverticulitis, with evidence of contained perforation along the inferior margin of  the inflamed sigmoid colon measuring up to 5.1 cm in size. No free intraperitoneal gas. 2. Extensive fat stranding between the inflamed sigmoid colon and dome of the bladder, with no definite colovesical fistula identified at this time. Nonspecific bladder wall thickening given decompressed state. Cystitis cannot be excluded. 3.  Aortic Atherosclerosis (ICD10-I70.0). Electronically Signed   By: Ozell Daring M.D.   On: 03/11/2024 16:18    Labs:  CBC: Recent Labs    01/19/24 1039 03/11/24 1350 03/11/24 2100 03/14/24 0626  WBC 5.4 14.7* 14.2* 8.6  HGB 15.5 15.8 15.2 13.2  HCT 46.3 47.6 45.6 39.9  PLT 218.0 199 186 189    COAGS: Recent Labs    03/12/24 1252  INR 1.1    BMP: Recent Labs    10/06/23 1015 11/17/23 1810 01/19/24 1039 03/11/24 1350 03/11/24 2100  NA 139 137 142 136  --   K 4.0 3.7 4.9 4.3  --   CL 102 99 102 100  --   CO2 28 26 33* 24  --   GLUCOSE 89 123* 85 135*  --   BUN 17 17 17 17   --   CALCIUM  9.6 9.3 10.0 9.6  --   CREATININE 0.93 0.85 0.90 0.87 0.97  GFRNONAA >60 >60  --  >60 >  60    LIVER FUNCTION TESTS: Recent Labs    07/11/23 1346 07/13/23 0742 11/17/23 1810 01/19/24 1039 03/11/24 1350  BILITOT 1.9* 1.9* 2.2* 1.6* 3.0*  AST 17  --  21 18 27   ALT 15  --  18 14 16   ALKPHOS 67  --  58 76 64  PROT 6.7  --  7.3 7.0 7.0  ALBUMIN 4.4  --  4.1 4.5 4.0    Assessment and Plan: Diverticular abscess s/p drain placement on 65/63 69 year old male with Ayala history of recurrent diverticulitis who presented to [facility] with lower abdominal pain. CT performed concerning for severe acute sigmoid diverticulitis with Ayala contained perforation along the inferior margin of the inflamed sigmoid colon measuring 5.1 cm in size with fat stranding and no definitive colovesical fistula. IR consulted and Dr. Karalee placed drain and sent cultures on 2/4. Pre-lim culture revealed abundant gram negative rods. Dr. Karalee also recommended switching from JP drain to  gravity bag within 24 hours to reduce fistula risk.   Current examination: Flushes/aspirates easily.  Insertion site unremarkable. Suture and stat lock in place. Dressed appropriately.  Per MD rec: JP bulb has been changed to gravity bag   Plan: Continue TID flushes with 5 cc NS. Record output Q shift. Dressing changes QD or PRN if soiled.  Call IR Ayala or on call IR MD if difficulty flushing or sudden change in drain output.  Repeat imaging/possible drain injection once output < 10 mL/QD (excluding flush material). Consideration for drain removal if output is < 10 mL/QD (excluding flush material), pending discussion with the providing surgical service.  Discharge planning: Please contact IR Ayala or on call IR MD prior to patient d/c to ensure appropriate follow up plans are in place. Typically patient will follow up with IR clinic 10-14 days post d/c for repeat imaging/possible drain injection. IR scheduler will contact patient with date/time of appointment. Patient will need to flush drain QD with 5 cc NS, record output QD, dressing changes every 2-3 days or earlier if soiled.   IR will continue to follow - please call with questions or concerns.  Electronically Signed: Thersia LULLA Rummer, PA 03/14/2024, 2:10 PM   I spent Ayala total of 25 Minutes at the the patient's bedside AND on the patient's hospital floor or unit, greater than 50% of which was counseling/coordinating care for drain follow up   "

## 2024-03-14 NOTE — Progress Notes (Signed)
 CC: Perforated Diverticulitis Subjective: S/p Drain placement yesterday with pus out Still having pain today but hungry Flatus but no Bms WBC normalized  Objective: Vital signs in last 24 hours: Temp:  [98.3 F (36.8 C)-98.7 F (37.1 C)] 98.7 F (37.1 C) (02/05 0834) Pulse Rate:  [55-82] 55 (02/05 0834) Resp:  [13-25] 16 (02/05 0834) BP: (136-156)/(70-86) 142/74 (02/05 0834) SpO2:  [98 %-100 %] 100 % (02/05 0834) Last BM Date :  (per patient 4 days ago)  Intake/Output from previous day: 02/04 0701 - 02/05 0700 In: 971.7 [I.V.:901.3; IV Piggyback:60.4] Out: 50 [Drains:50] Intake/Output this shift: No intake/output data recorded.  Physical exam:  Abdomen is soft, slightly distended, tender to palpation over lower abdomen, no signs of peritontiis Drain in place with murky, purulent fluid out  Lab Results: CBC  Recent Labs    03/11/24 2100 03/14/24 0626  WBC 14.2* 8.6  HGB 15.2 13.2  HCT 45.6 39.9  PLT 186 189   BMET Recent Labs    03/11/24 1350 03/11/24 2100  NA 136  --   K 4.3  --   CL 100  --   CO2 24  --   GLUCOSE 135*  --   BUN 17  --   CREATININE 0.87 0.97  CALCIUM  9.6  --    PT/INR Recent Labs    03/12/24 1252  LABPROT 15.2  INR 1.1   ABG No results for input(s): PHART, HCO3 in the last 72 hours.  Invalid input(s): PCO2, PO2  Studies/Results: CT GUIDED PERITONEAL/RETROPERITONEAL FLUID DRAIN BY PERC CATH Result Date: 03/13/2024 INDICATION: 69 year old male with diverticulitis complicated by diverticular abscess. EXAM: CT-guided drain placement MEDICATIONS: The patient is currently admitted to the hospital and receiving intravenous antibiotics. The antibiotics were administered within an appropriate time frame prior to the initiation of the procedure. ANESTHESIA/SEDATION: Moderate (conscious) sedation was employed during this procedure. A total of Versed  3 mg and Fentanyl  100 mcg was administered intravenously by the radiology nurse. Total  intra-service moderate Sedation Time: 16 minutes. The patient's level of consciousness and vital signs were monitored continuously by radiology nursing throughout the procedure under my direct supervision. COMPLICATIONS: None immediate. PROCEDURE: Informed written consent was obtained from the patient after a thorough discussion of the procedural risks, benefits and alternatives. All questions were addressed. Maximal Sterile Barrier Technique was utilized including caps, mask, sterile gowns, sterile gloves, sterile drape, hand hygiene and skin antiseptic. A timeout was performed prior to the initiation of the procedure. A planning axial CT scan was performed. The pericolonic abscess is successfully identified. A suitable skin entry site was selected and marked. The skin was sterilely prepped and draped in the standard fashion using chlorhexidine  skin prep. Local anesthesia was attained by infiltration with 1% lidocaine . A small dermatotomy was made. Under intermittent CT guidance, an 18 gauge trocar needle was advanced from a suprapubic approach into the fluid and gas collection. A 0.035 wire was then advanced and coiled. The introducer needle was removed. The percutaneous tract was dilated to 12 French. A 12 French drainage catheter was then advanced over the wire and formed. Aspiration yields 20 mL thick purulent fluid. The abscess cavity was then lavaged with 20 mL sterile saline and connected to JP bulb suction. Post drain placement CT imaging demonstrates a well-positioned drainage catheter and evacuation of the abscess. No evidence of complication. The drainage catheter was secured to the skin with 0 Prolene suture. A sterile bandage was applied. IMPRESSION: Placement of a 12 French drainage catheter  into the diverticular abscess via a suprapubic approach. Evacuation of 20 mL thick purulent fluid. Samples were sent for Gram stain and culture. PLAN: 1. Drain to JP bulb suction for 24 hours. Drain should then be  transitioned to gravity bag to minimize risk of fistula formation. 2. Flush drainage catheter t.i.d. Electronically Signed   By: Wilkie Lent M.D.   On: 03/13/2024 16:09    Anti-infectives: Anti-infectives (From admission, onward)    Start     Dose/Rate Route Frequency Ordered Stop   03/12/24 0600  piperacillin -tazobactam (ZOSYN ) IVPB 3.375 g  Status:  Discontinued        3.375 g 12.5 mL/hr over 240 Minutes Intravenous Every 8 hours 03/11/24 1716 03/11/24 1722   03/12/24 0600  piperacillin -tazobactam (ZOSYN ) IVPB 3.375 g        3.375 g 12.5 mL/hr over 240 Minutes Intravenous Every 8 hours 03/11/24 1727     03/11/24 1645  piperacillin -tazobactam (ZOSYN ) IVPB 3.375 g        3.375 g 100 mL/hr over 30 Minutes Intravenous  Once 03/11/24 1642 03/11/24 1737       Assessment/Plan:  Patient with perforated diverticulitis, s/p IR drainage  Continue abx, recommend total of 10 days Okay for soft diet today, likely home in AM tomorrow  35 minutes spent reviewing chart, performing interval H and P and discussing tx options with patient  Jayson Endow, M.D. [facility]

## 2024-03-14 NOTE — Plan of Care (Signed)

## 2024-03-15 ENCOUNTER — Other Ambulatory Visit: Payer: Self-pay

## 2024-03-15 LAB — AEROBIC/ANAEROBIC CULTURE W GRAM STAIN (SURGICAL/DEEP WOUND)

## 2024-03-15 MED ORDER — SENNA 8.6 MG PO TABS
1.0000 | ORAL_TABLET | Freq: Every day | ORAL | Status: AC
  Administered 2024-03-15: 8.6 mg via ORAL
  Filled 2024-03-15: qty 1

## 2024-03-15 MED ORDER — POLYETHYLENE GLYCOL 3350 17 G PO PACK: 17.0000 g | PACK | Freq: Every day | ORAL | Status: DC

## 2024-03-15 MED ORDER — DOCUSATE SODIUM 100 MG PO CAPS
100.0000 mg | ORAL_CAPSULE | Freq: Two times a day (BID) | ORAL | Status: AC
  Administered 2024-03-15 (×2): 100 mg via ORAL
  Filled 2024-03-15 (×2): qty 1

## 2024-03-15 NOTE — Progress Notes (Signed)
 " PROGRESS NOTE    [name].  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 Wound cx growing e.coli, sens pending.  S/p drain placed on 03/13/24 . Will need surg outpatient in about 12 weeks as per gen surg   HTN: continue on losartan     HLD: continue on statin    COPD: w/o exacerbation. Bronchodilators prn    GERD: continue on PPI    Sleep apnea: does not use CPAP  Prediabetes: HbA1c 5.7. Needs prediabetes education      DVT prophylaxis: lovenox   Code Status: full  Family Communication: discussed pt's care w/ pt's family at bedside and answered their questions  Disposition Plan: likely d/c back home  Status is: Inpatient Remains inpatient appropriate because: severity of illness    Level of care: Med-Surg Consultants:  Gen surg IR  Procedures:  Antimicrobials: zosyn     Subjective: Pt c/o intermittent abd pain still   Objective: Vitals:   03/14/24 0834 03/14/24 1951 03/15/24 0408 03/15/24 0756  BP: (!) 142/74 (!) 151/79 139/71 (!) 153/84  Pulse: (!) 55 (!) 59 (!) 50 (!) 55  Resp: 16 16 16 17   Temp: 98.7 F (37.1 C) 98.6 F (37 C) 97.9 F (36.6 C) 97.8 F (36.6 C)  TempSrc:  Oral Oral   SpO2: 100% 98% 99% 100%  Weight:      Height:        Intake/Output Summary (Last 24 hours) at 03/15/2024 0950 Last data filed at 03/15/2024 0641 Gross per 24 hour  Intake 485 ml  Output 0 ml  Net 485 ml   Filed Weights   03/12/24 0722  Weight: 90.7 kg    Examination:  General exam: appears calm but uncomfortable  Respiratory  system: clear breath sounds b/l  Cardiovascular system: S1 & S2+. No rubs or clicks   Gastrointestinal system: abd is soft, tenderness to palpation, ND, hypoactive bowel sounds Central nervous system: alert & oriented. Moves all extremities  Psychiatry: Judgement and insight appears normal. Appropriate mood and affect    Data Reviewed: I have personally reviewed following labs and imaging studies  CBC: Recent Labs  Lab 03/11/24 1350 03/11/24 2100 03/14/24 0626  WBC 14.7* 14.2* 8.6  NEUTROABS  --   --  6.6  HGB 15.8 15.2 13.2  HCT 47.6 45.6 39.9  MCV 88.5 90.1 89.9  PLT 199 186 189   Basic Metabolic Panel: Recent Labs  Lab 03/11/24 1350 03/11/24 2100  NA 136  --   K 4.3  --   CL 100  --   CO2 24  --   GLUCOSE 135*  --   BUN 17  --   CREATININE 0.87 0.97  CALCIUM  9.6  --    GFR: Estimated Creatinine Clearance: 83.7 mL/min (by C-G formula based on SCr of 0.97 mg/dL). Liver Function Tests: Recent Labs  Lab 03/11/24 1350  AST 27  ALT 16  ALKPHOS 64  BILITOT 3.0*  PROT 7.0  ALBUMIN 4.0   Recent Labs  Lab 03/11/24 1350  LIPASE 22   No results for input(s): AMMONIA in the  last 168 hours. Coagulation Profile: Recent Labs  Lab 03/12/24 1252  INR 1.1   Cardiac Enzymes: No results for input(s): CKTOTAL, CKMB, CKMBINDEX, TROPONINI in the last 168 hours. BNP (last 3 results) No results for input(s): PROBNP in the last 8760 hours. HbA1C: No results for input(s): HGBA1C in the last 72 hours. CBG: No results for input(s): GLUCAP in the last 168 hours. Lipid Profile: No results for input(s): CHOL, HDL, LDLCALC, TRIG, CHOLHDL, LDLDIRECT in the last 72 hours. Thyroid  Function Tests: No results for input(s): TSH, T4TOTAL, FREET4, T3FREE, THYROIDAB in the last 72 hours. Anemia Panel: No results for input(s): VITAMINB12, FOLATE, FERRITIN, TIBC, IRON, RETICCTPCT in the last 72 hours. Sepsis Labs: Recent Labs  Lab  03/11/24 1511 03/11/24 2100  LATICACIDVEN 0.9 1.3    Recent Results (from the past 240 hours)  Aerobic/Anaerobic Culture w Gram Stain (surgical/deep wound)     Status: None (Preliminary result)   Collection Time: 03/13/24  2:58 PM   Specimen: Abscess  Result Value Ref Range Status   Specimen Description   Final    ABSCESS Performed at [facility], [address], [place], [place] [address]    Special Requests   Final    NONE Performed at [facility], [address] [place], [place] [address]    Gram Stain   Final    MODERATE WBC PRESENT,BOTH PMN AND MONONUCLEAR FEW GRAM NEGATIVE RODS RARE GRAM POSITIVE COCCI    Culture   Final    ABUNDANT GRAM NEGATIVE RODS IDENTIFICATION AND SUSCEPTIBILITIES TO FOLLOW Performed at [facility], [address], [place], [place] [address]    Report Status PENDING  Incomplete         Radiology Studies: CT GUIDED PERITONEAL/RETROPERITONEAL FLUID DRAIN BY PERC CATH Result Date: 03/13/2024 INDICATION: 69 year old male with diverticulitis complicated by diverticular abscess. EXAM: CT-guided drain placement MEDICATIONS: The patient is currently admitted to the hospital and receiving intravenous antibiotics. The antibiotics were administered within an appropriate time frame prior to the initiation of the procedure. ANESTHESIA/SEDATION: Moderate (conscious) sedation was employed during this procedure. A total of Versed  3 mg and Fentanyl  100 mcg was administered intravenously by the radiology nurse. Total intra-service moderate Sedation Time: 16 minutes. The patient's level of consciousness and vital signs were monitored continuously by radiology nursing throughout the procedure under my direct supervision. COMPLICATIONS: None immediate. PROCEDURE: Informed written consent was obtained from the patient after a thorough discussion of the procedural risks, benefits and alternatives. All questions were addressed. Maximal Sterile  Barrier Technique was utilized including caps, mask, sterile gowns, sterile gloves, sterile drape, hand hygiene and skin antiseptic. A timeout was performed prior to the initiation of the procedure. A planning axial CT scan was performed. The pericolonic abscess is successfully identified. A suitable skin entry site was selected and marked. The skin was sterilely prepped and draped in the standard fashion using chlorhexidine  skin prep. Local anesthesia was attained by infiltration with 1% lidocaine . A small dermatotomy was made. Under intermittent CT guidance, an 18 gauge trocar needle was advanced from a suprapubic approach into the fluid and gas collection. A 0.035 wire was then advanced and coiled. The introducer needle was removed. The percutaneous tract was dilated to 12 French. A 12 French drainage catheter was then advanced over the wire and formed. Aspiration yields 20 mL thick purulent fluid. The abscess cavity was then lavaged with 20 mL sterile saline and connected to JP bulb suction. Post drain placement CT imaging  demonstrates a well-positioned drainage catheter and evacuation of the abscess. No evidence of complication. The drainage catheter was secured to the skin with 0 Prolene suture. A sterile bandage was applied. IMPRESSION: Placement of a 12 French drainage catheter into the diverticular abscess via a suprapubic approach. Evacuation of 20 mL thick purulent fluid. Samples were sent for Gram stain and culture. PLAN: 1. Drain to JP bulb suction for 24 hours. Drain should then be transitioned to gravity bag to minimize risk of fistula formation. 2. Flush drainage catheter t.i.d. Electronically Signed   By: Wilkie Lent M.D.   On: 03/13/2024 16:09        Scheduled Meds:  atorvastatin   40 mg Oral q morning   docusate sodium   100 mg Oral BID   enoxaparin  (LOVENOX ) injection  40 mg Subcutaneous Q24H   finasteride   5 mg Oral Daily   fluticasone  furoate-vilanterol  1 puff Inhalation Daily    losartan   50 mg Oral Daily   pantoprazole  (PROTONIX ) IV  40 mg Intravenous Q12H   senna  1 tablet Oral Daily   sodium chloride  flush  5 mL Intracatheter Q8H   Continuous Infusions:  sodium chloride  100 mL/hr at 03/13/24 1736   piperacillin -tazobactam (ZOSYN )  IV 3.375 g (03/15/24 0631)     LOS: 4 days       Anthony CHRISTELLA Pouch, MD [facility] Pager [phone]  If 7PM-7AM, please contact night-coverage [email or web address] 03/15/2024, 9:50 AM   "

## 2024-03-15 NOTE — Progress Notes (Signed)
 CC: Perforated Diverticulitis Subjective: S/p Drain placement  Tolerated diet yesterday Flatus but no Bms WBC normalized  Objective: Vital signs in last 24 hours: Temp:  [97.8 F (36.6 C)-98.6 F (37 C)] 97.8 F (36.6 C) (02/06 0756) Pulse Rate:  [50-59] 55 (02/06 0756) Resp:  [16-17] 17 (02/06 0756) BP: (139-153)/(71-84) 153/84 (02/06 0756) SpO2:  [98 %-100 %] 100 % (02/06 0756) Last BM Date :  (per patient 4 days ago)  Intake/Output from previous day: 02/05 0701 - 02/06 0700 In: 485 [P.O.:480; I.V.:5] Out: 0  Intake/Output this shift: No intake/output data recorded.  Physical exam:  Abdomen is soft, less distended, tender to palpation over lower abdomen, no signs of peritontiis Drain in place to gravity bag, murky fluid  Lab Results: CBC  Recent Labs    03/14/24 0626  WBC 8.6  HGB 13.2  HCT 39.9  PLT 189   BMET No results for input(s): NA, K, CL, CO2, GLUCOSE, BUN, CREATININE, CALCIUM  in the last 72 hours.  PT/INR Recent Labs    03/12/24 1252  LABPROT 15.2  INR 1.1   ABG No results for input(s): PHART, HCO3 in the last 72 hours.  Invalid input(s): PCO2, PO2  Studies/Results: CT GUIDED PERITONEAL/RETROPERITONEAL FLUID DRAIN BY PERC CATH Result Date: 03/13/2024 INDICATION: 69 year old male with diverticulitis complicated by diverticular abscess. EXAM: CT-guided drain placement MEDICATIONS: The patient is currently admitted to the hospital and receiving intravenous antibiotics. The antibiotics were administered within an appropriate time frame prior to the initiation of the procedure. ANESTHESIA/SEDATION: Moderate (conscious) sedation was employed during this procedure. A total of Versed  3 mg and Fentanyl  100 mcg was administered intravenously by the radiology nurse. Total intra-service moderate Sedation Time: 16 minutes. The patient's level of consciousness and vital signs were monitored continuously by radiology nursing throughout the  procedure under my direct supervision. COMPLICATIONS: None immediate. PROCEDURE: Informed written consent was obtained from the patient after a thorough discussion of the procedural risks, benefits and alternatives. All questions were addressed. Maximal Sterile Barrier Technique was utilized including caps, mask, sterile gowns, sterile gloves, sterile drape, hand hygiene and skin antiseptic. A timeout was performed prior to the initiation of the procedure. A planning axial CT scan was performed. The pericolonic abscess is successfully identified. A suitable skin entry site was selected and marked. The skin was sterilely prepped and draped in the standard fashion using chlorhexidine  skin prep. Local anesthesia was attained by infiltration with 1% lidocaine . A small dermatotomy was made. Under intermittent CT guidance, an 18 gauge trocar needle was advanced from a suprapubic approach into the fluid and gas collection. A 0.035 wire was then advanced and coiled. The introducer needle was removed. The percutaneous tract was dilated to 12 French. A 12 French drainage catheter was then advanced over the wire and formed. Aspiration yields 20 mL thick purulent fluid. The abscess cavity was then lavaged with 20 mL sterile saline and connected to JP bulb suction. Post drain placement CT imaging demonstrates a well-positioned drainage catheter and evacuation of the abscess. No evidence of complication. The drainage catheter was secured to the skin with 0 Prolene suture. A sterile bandage was applied. IMPRESSION: Placement of a 12 French drainage catheter into the diverticular abscess via a suprapubic approach. Evacuation of 20 mL thick purulent fluid. Samples were sent for Gram stain and culture. PLAN: 1. Drain to JP bulb suction for 24 hours. Drain should then be transitioned to gravity bag to minimize risk of fistula formation. 2. Flush drainage catheter t.i.d.  Electronically Signed   By: Wilkie Lent M.D.   On:  03/13/2024 16:09    Anti-infectives: Anti-infectives (From admission, onward)    Start     Dose/Rate Route Frequency Ordered Stop   03/12/24 0600  piperacillin -tazobactam (ZOSYN ) IVPB 3.375 g  Status:  Discontinued        3.375 g 12.5 mL/hr over 240 Minutes Intravenous Every 8 hours 03/11/24 1716 03/11/24 1722   03/12/24 0600  piperacillin -tazobactam (ZOSYN ) IVPB 3.375 g        3.375 g 12.5 mL/hr over 240 Minutes Intravenous Every 8 hours 03/11/24 1727     03/11/24 1645  piperacillin -tazobactam (ZOSYN ) IVPB 3.375 g        3.375 g 100 mL/hr over 30 Minutes Intravenous  Once 03/11/24 1642 03/11/24 1737       Assessment/Plan:  Patient with perforated diverticulitis, s/p IR drainage  Continue abx, recommend total of 10 days Okay for d/c home today, will arrange follow up for him in our office Should follow up with IR for repeat imaging and drain removal Discussed need for colonoscopy  followed by surgical resection.   35 minutes spent reviewing chart, performing interval H and P and discussing tx options with patient  Jayson Endow, M.D. [facility]

## 2024-03-15 NOTE — Plan of Care (Signed)
   Problem: Activity: Goal: Risk for activity intolerance will decrease Outcome: Progressing   Problem: Pain Managment: Goal: General experience of comfort will improve and/or be controlled Outcome: Progressing   Problem: Safety: Goal: Ability to remain free from injury will improve Outcome: Progressing

## 2024-03-28 ENCOUNTER — Ambulatory Visit: Admitting: General Surgery

## 2024-05-09 ENCOUNTER — Ambulatory Visit: Admitting: Urology
